# Patient Record
Sex: Female | Born: 1950 | Race: White | Hispanic: No | Marital: Married | Smoking: Former smoker
Health system: Southern US, Community
[De-identification: ages and names within clinical notes are randomized; demographics above are authoritative.]

## PROBLEM LIST (undated history)

## (undated) DIAGNOSIS — M3500 Sicca syndrome, unspecified: Secondary | ICD-10-CM

## (undated) DIAGNOSIS — F419 Anxiety disorder, unspecified: Secondary | ICD-10-CM

## (undated) DIAGNOSIS — T7840XA Allergy, unspecified, initial encounter: Secondary | ICD-10-CM

## (undated) DIAGNOSIS — M3390 Dermatopolymyositis, unspecified, organ involvement unspecified: Secondary | ICD-10-CM

## (undated) DIAGNOSIS — R112 Nausea with vomiting, unspecified: Secondary | ICD-10-CM

## (undated) DIAGNOSIS — Z87891 Personal history of nicotine dependence: Secondary | ICD-10-CM

## (undated) DIAGNOSIS — K219 Gastro-esophageal reflux disease without esophagitis: Secondary | ICD-10-CM

## (undated) DIAGNOSIS — I7300 Raynaud's syndrome without gangrene: Secondary | ICD-10-CM

## (undated) DIAGNOSIS — Z9889 Other specified postprocedural states: Secondary | ICD-10-CM

## (undated) DIAGNOSIS — C801 Malignant (primary) neoplasm, unspecified: Secondary | ICD-10-CM

## (undated) HISTORY — PX: NASAL SEPTUM SURGERY: SHX37

## (undated) HISTORY — PX: WISDOM TOOTH EXTRACTION: SHX21

## (undated) HISTORY — DX: Allergy, unspecified, initial encounter: T78.40XA

## (undated) HISTORY — PX: BREAST SURGERY: SHX581

## (undated) HISTORY — DX: Anxiety disorder, unspecified: F41.9

## (undated) HISTORY — DX: Personal history of nicotine dependence: Z87.891

## (undated) HISTORY — DX: Sicca syndrome, unspecified: M35.00

## (undated) HISTORY — PX: DIAGNOSTIC LAPAROSCOPY: SUR761

## (undated) HISTORY — PX: OTHER SURGICAL HISTORY: SHX169

## (undated) HISTORY — DX: Raynaud's syndrome without gangrene: I73.00

## (undated) HISTORY — PX: COLONOSCOPY: SHX174

## (undated) HISTORY — DX: Dermatopolymyositis, unspecified, organ involvement unspecified: M33.90

## (undated) HISTORY — PX: TONSILLECTOMY: SUR1361

## (undated) HISTORY — PX: BUNIONECTOMY: SHX129

---

## 1998-02-20 ENCOUNTER — Other Ambulatory Visit: Admission: RE | Admit: 1998-02-20 | Discharge: 1998-02-20 | Payer: Self-pay | Admitting: Obstetrics & Gynecology

## 1999-07-23 ENCOUNTER — Other Ambulatory Visit: Admission: RE | Admit: 1999-07-23 | Discharge: 1999-07-23 | Payer: Self-pay | Admitting: Obstetrics & Gynecology

## 2000-08-05 ENCOUNTER — Other Ambulatory Visit: Admission: RE | Admit: 2000-08-05 | Discharge: 2000-08-05 | Payer: Self-pay | Admitting: Obstetrics & Gynecology

## 2002-05-03 ENCOUNTER — Other Ambulatory Visit: Admission: RE | Admit: 2002-05-03 | Discharge: 2002-05-03 | Payer: Self-pay | Admitting: Obstetrics & Gynecology

## 2003-05-08 ENCOUNTER — Other Ambulatory Visit: Admission: RE | Admit: 2003-05-08 | Discharge: 2003-05-08 | Payer: Self-pay | Admitting: Obstetrics & Gynecology

## 2004-11-05 ENCOUNTER — Other Ambulatory Visit: Admission: RE | Admit: 2004-11-05 | Discharge: 2004-11-05 | Payer: Self-pay | Admitting: Obstetrics & Gynecology

## 2009-01-11 ENCOUNTER — Encounter (INDEPENDENT_AMBULATORY_CARE_PROVIDER_SITE_OTHER): Payer: Self-pay | Admitting: *Deleted

## 2011-04-14 ENCOUNTER — Other Ambulatory Visit: Payer: Self-pay | Admitting: Obstetrics and Gynecology

## 2011-04-14 ENCOUNTER — Encounter (HOSPITAL_COMMUNITY): Payer: Self-pay | Admitting: *Deleted

## 2011-04-15 ENCOUNTER — Encounter (HOSPITAL_COMMUNITY): Payer: Self-pay | Admitting: Anesthesiology

## 2011-04-15 ENCOUNTER — Other Ambulatory Visit: Payer: Self-pay | Admitting: Obstetrics and Gynecology

## 2011-04-15 ENCOUNTER — Ambulatory Visit (HOSPITAL_COMMUNITY): Payer: BC Managed Care – PPO | Admitting: Anesthesiology

## 2011-04-15 ENCOUNTER — Ambulatory Visit (HOSPITAL_COMMUNITY)
Admission: RE | Admit: 2011-04-15 | Discharge: 2011-04-15 | Disposition: A | Discharge status: Home / Self Care | Payer: BC Managed Care – PPO | Source: Ambulatory Visit | Attending: Obstetrics and Gynecology | Admitting: Obstetrics and Gynecology

## 2011-04-15 ENCOUNTER — Encounter (HOSPITAL_COMMUNITY)
Admission: RE | Disposition: A | Discharge status: Home / Self Care | Payer: Self-pay | Source: Ambulatory Visit | Attending: Obstetrics and Gynecology

## 2011-04-15 ENCOUNTER — Encounter (HOSPITAL_COMMUNITY): Payer: Self-pay | Admitting: *Deleted

## 2011-04-15 DIAGNOSIS — N950 Postmenopausal bleeding: Secondary | ICD-10-CM | POA: Insufficient documentation

## 2011-04-15 DIAGNOSIS — N939 Abnormal uterine and vaginal bleeding, unspecified: Secondary | ICD-10-CM

## 2011-04-15 HISTORY — DX: Other specified postprocedural states: R11.2

## 2011-04-15 HISTORY — PX: DILATION AND CURETTAGE OF UTERUS: SHX78

## 2011-04-15 HISTORY — DX: Malignant (primary) neoplasm, unspecified: C80.1

## 2011-04-15 HISTORY — DX: Gastro-esophageal reflux disease without esophagitis: K21.9

## 2011-04-15 HISTORY — DX: Other specified postprocedural states: Z98.890

## 2011-04-15 LAB — CBC
HCT: 44.5 % (ref 36.0–46.0)
Hemoglobin: 15.1 g/dL — ABNORMAL HIGH (ref 12.0–15.0)
RBC: 4.87 MIL/uL (ref 3.87–5.11)
WBC: 7.1 10*3/uL (ref 4.0–10.5)

## 2011-04-15 SURGERY — DILATION AND CURETTAGE
Anesthesia: Monitor Anesthesia Care | Site: Vagina | Wound class: Clean Contaminated

## 2011-04-15 MED ORDER — FENTANYL CITRATE 0.05 MG/ML IJ SOLN
INTRAMUSCULAR | Status: AC
  Filled 2011-04-15: qty 2

## 2011-04-15 MED ORDER — MIDAZOLAM HCL 2 MG/2ML IJ SOLN
INTRAMUSCULAR | Status: AC
  Filled 2011-04-15: qty 2

## 2011-04-15 MED ORDER — PROPOFOL 10 MG/ML IV EMUL
INTRAVENOUS | Status: AC
  Filled 2011-04-15: qty 20

## 2011-04-15 MED ORDER — PROMETHAZINE HCL 25 MG/ML IJ SOLN: 6.2500 mg | INTRAMUSCULAR | Status: DC | PRN

## 2011-04-15 MED ORDER — LIDOCAINE HCL (CARDIAC) 20 MG/ML IV SOLN
INTRAVENOUS | Status: DC | PRN
  Administered 2011-04-15: 20 mg via INTRAVENOUS

## 2011-04-15 MED ORDER — CEFAZOLIN SODIUM 1-5 GM-% IV SOLN
1.0000 g | INTRAVENOUS | Status: AC
  Administered 2011-04-15: 1 g via INTRAVENOUS

## 2011-04-15 MED ORDER — ONDANSETRON HCL 4 MG/2ML IJ SOLN
INTRAMUSCULAR | Status: DC | PRN
  Administered 2011-04-15: 4 mg via INTRAVENOUS

## 2011-04-15 MED ORDER — DEXAMETHASONE SODIUM PHOSPHATE 4 MG/ML IJ SOLN
INTRAMUSCULAR | Status: DC | PRN
  Administered 2011-04-15: 4 mg via INTRAVENOUS

## 2011-04-15 MED ORDER — MEPERIDINE HCL 25 MG/ML IJ SOLN: 6.2500 mg | INTRAMUSCULAR | Status: DC | PRN

## 2011-04-15 MED ORDER — FENTANYL CITRATE 0.05 MG/ML IJ SOLN
25.0000 ug | INTRAMUSCULAR | Status: DC | PRN
  Administered 2011-04-15 (×2): 50 ug via INTRAVENOUS

## 2011-04-15 MED ORDER — KETOROLAC TROMETHAMINE 60 MG/2ML IM SOLN
INTRAMUSCULAR | Status: AC
  Filled 2011-04-15: qty 2

## 2011-04-15 MED ORDER — FENTANYL CITRATE 0.05 MG/ML IJ SOLN
INTRAMUSCULAR | Status: AC
  Administered 2011-04-15: 50 ug via INTRAVENOUS
  Filled 2011-04-15: qty 2

## 2011-04-15 MED ORDER — KETOROLAC TROMETHAMINE 30 MG/ML IJ SOLN: 15.0000 mg | Freq: Once | INTRAMUSCULAR | Status: DC | PRN

## 2011-04-15 MED ORDER — LACTATED RINGERS IV SOLN
INTRAVENOUS | Status: DC
  Administered 2011-04-15 (×3): via INTRAVENOUS

## 2011-04-15 MED ORDER — CEFAZOLIN SODIUM 1-5 GM-% IV SOLN
INTRAVENOUS | Status: AC
  Filled 2011-04-15: qty 50

## 2011-04-15 MED ORDER — DEXAMETHASONE SODIUM PHOSPHATE 10 MG/ML IJ SOLN
INTRAMUSCULAR | Status: AC
  Filled 2011-04-15: qty 1

## 2011-04-15 MED ORDER — LIDOCAINE HCL 1 % IJ SOLN
INTRAMUSCULAR | Status: DC | PRN
  Administered 2011-04-15: 6 mL

## 2011-04-15 MED ORDER — IBUPROFEN 200 MG PO TABS: 600.0000 mg | ORAL_TABLET | Freq: Four times a day (QID) | ORAL | Status: AC | PRN

## 2011-04-15 MED ORDER — FENTANYL CITRATE 0.05 MG/ML IJ SOLN: 25.0000 ug | INTRAMUSCULAR | Status: DC | PRN

## 2011-04-15 MED ORDER — ONDANSETRON HCL 4 MG/2ML IJ SOLN
INTRAMUSCULAR | Status: AC
  Filled 2011-04-15: qty 2

## 2011-04-15 MED ORDER — PROPOFOL 10 MG/ML IV EMUL
INTRAVENOUS | Status: DC | PRN
  Administered 2011-04-15 (×2): 30 mg via INTRAVENOUS
  Administered 2011-04-15 (×3): 20 mg via INTRAVENOUS
  Administered 2011-04-15: 30 mg via INTRAVENOUS

## 2011-04-15 MED ORDER — MIDAZOLAM HCL 5 MG/5ML IJ SOLN
INTRAMUSCULAR | Status: DC | PRN
  Administered 2011-04-15: 2 mg via INTRAVENOUS

## 2011-04-15 MED ORDER — LIDOCAINE HCL (CARDIAC) 20 MG/ML IV SOLN
INTRAVENOUS | Status: AC
  Filled 2011-04-15: qty 5

## 2011-04-15 MED ORDER — KETOROLAC TROMETHAMINE 30 MG/ML IJ SOLN
INTRAMUSCULAR | Status: DC | PRN
  Administered 2011-04-15: 30 mg via INTRAVENOUS

## 2011-04-15 MED ORDER — FENTANYL CITRATE 0.05 MG/ML IJ SOLN
INTRAMUSCULAR | Status: DC | PRN
  Administered 2011-04-15: 50 ug via INTRAVENOUS
  Administered 2011-04-15 (×2): 25 ug via INTRAVENOUS

## 2011-04-15 SURGICAL SUPPLY — 13 items
CATH ROBINSON RED A/P 16FR (CATHETERS) ×2 IMPLANT
CLOTH BEACON ORANGE TIMEOUT ST (SAFETY) ×2 IMPLANT
CONTAINER PREFILL 10% NBF 60ML (FORM) ×4 IMPLANT
DECANTER SPIKE VIAL GLASS SM (MISCELLANEOUS) ×1 IMPLANT
GLOVE ECLIPSE 7.0 STRL STRAW (GLOVE) ×4 IMPLANT
GOWN PREVENTION PLUS LG XLONG (DISPOSABLE) ×2 IMPLANT
GOWN PREVENTION PLUS XLARGE (GOWN DISPOSABLE) ×2 IMPLANT
PACK VAGINAL MINOR WOMEN LF (CUSTOM PROCEDURE TRAY) ×2 IMPLANT
PAD PREP 24X48 CUFFED NSTRL (MISCELLANEOUS) ×2 IMPLANT
SET BERKELEY SUCTION TUBING (SUCTIONS) ×1 IMPLANT
TOWEL OR 17X24 6PK STRL BLUE (TOWEL DISPOSABLE) ×4 IMPLANT
VACURETTE 7MM CVD STRL WRAP (CANNULA) ×1 IMPLANT
WATER STERILE IRR 1000ML POUR (IV SOLUTION) ×2 IMPLANT

## 2011-04-15 NOTE — Anesthesia Postprocedure Evaluation (Signed)
Anesthesia Post Note  Patient: Marisa Orozco  Procedure(s) Performed:  DILATATION AND CURETTAGE  Anesthesia type: MAC  Patient location: PACU  Post pain: Pain level controlled  Post assessment: Post-op Vital signs reviewed  Last Vitals:  Filed Vitals:   04/15/11 1400  BP: 118/63  Pulse: 69  Temp:   Resp: 18    Post vital signs: Reviewed  Level of consciousness: sedated  Complications: No apparent anesthesia complications

## 2011-04-15 NOTE — Anesthesia Preprocedure Evaluation (Addendum)
Anesthesia Evaluation    Reviewed: Allergy & Precautions, H&P , Patient's Chart, lab work & pertinent test results, reviewed documented beta blocker date and time   History of Anesthesia Complications (+) PONV  Airway Mallampati: I TM Distance: >3 FB Neck ROM: full    Dental No notable dental hx. (+) Teeth Intact   Pulmonary neg pulmonary ROS,  clear to auscultation  Pulmonary exam normal       Cardiovascular neg cardio ROS regular Normal    Neuro/Psych Negative Neurological ROS  Negative Psych ROS   GI/Hepatic Neg liver ROS, GERD-  ,  Endo/Other  Negative Endocrine ROS  Renal/GU negative Renal ROS  Genitourinary negative   Musculoskeletal negative musculoskeletal ROS (+)   Abdominal Normal abdominal exam  (+) obese,  Abdomen: soft.    Peds  Hematology negative hematology ROS (+)   Anesthesia Other Findings   Reproductive/Obstetrics negative OB ROS                          Anesthesia Physical Anesthesia Plan  ASA: II  Anesthesia Plan: MAC   Post-op Pain Management:    Induction:   Airway Management Planned:   Additional Equipment:   Intra-op Plan:   Post-operative Plan:   Informed Consent: I have reviewed the patients History and Physical, chart, labs and discussed the procedure including the risks, benefits and alternatives for the proposed anesthesia with the patient or authorized representative who has indicated his/her understanding and acceptance.     Plan Discussed with: Anesthesiologist and Surgeon  Anesthesia Plan Comments:         Anesthesia Quick Evaluation

## 2011-04-15 NOTE — Transfer of Care (Signed)
Immediate Anesthesia Transfer of Care Note  Patient: Marisa Orozco  Procedure(s) Performed:  DILATATION AND CURETTAGE  Patient Location: PACU  Anesthesia Type: MAC  Level of Consciousness: awake, alert , oriented and patient cooperative  Airway & Oxygen Therapy: Patient Spontanous Breathing and Patient connected to nasal cannula oxygen  Post-op Assessment: Report given to PACU RN and Post -op Vital signs reviewed and stable  Post vital signs: Reviewed and stable  Complications: No apparent anesthesia complications

## 2011-04-15 NOTE — Preoperative (Signed)
Beta Blockers   Reason not to administer Beta Blockers:Not Applicable 

## 2011-04-15 NOTE — H&P (Signed)
Pt is a 61 y/o white female who had an episode of heavy postmenopausal bleeding two days ago.  Pt has been on ERT for 3 yrs. No problems until recently. Pt stopped ERT 3 weeks ago for 10 days.  Then restarted. Seen in office two days ago. Copious amt of dark blood. U/S revealed large clot in cervix. Os appears stenotic. Bleeding has contiuned to be heavy.  PE ABD- wnl Pelvic- ext-wnl Vagina- dark blood Cx- soft, enlarged, stenotic Ut-nontender IMP/ PMB Plan/D & C

## 2011-04-16 ENCOUNTER — Encounter (HOSPITAL_COMMUNITY): Payer: Self-pay | Admitting: Obstetrics and Gynecology

## 2011-04-16 NOTE — Op Note (Signed)
Marisa Orozco, Marisa Orozco NO.:  [id number]  MEDICAL RECORD NO.:  [id number]  LOCATION:  [facility]                          FACILITY:  [facility]  PHYSICIAN:  Malva Limes, M.D.    DATE OF BIRTH:  10-28-1950  DATE OF PROCEDURE:  04/15/2011 DATE OF DISCHARGE:  04/15/2011                              OPERATIVE REPORT   PREOPERATIVE DIAGNOSIS:  Postmenopausal bleeding.  POSTOPERATIVE DIAGNOSIS:  Postmenopausal bleeding.  PROCEDURE:  Dilation and curettage.  SURGEON:  Malva Limes, M.D.  ANESTHESIA:  MAC with paracervical block.  DRAINS:  None.  ANTIBIOTICS:  Ancef 1 g.  SPECIMENS:  Endometrial curettings sent to pathology.  COMPLICATIONS:  None.  ESTIMATED BLOOD LOSS:  75 mL.  PROCEDURE:  The patient was taken to the operating room where she was placed in dorsal supine position.  MAC anesthesia was administered.  She was placed in dorsal lithotomy position.  She was prepped and draped in the usual fashion for this procedure.  A sterile speculum was placed in the vagina, 20 mL of 1% lidocaine was used for paracervical block.  The single-tooth tenaculum was applied to the anterior cervical lip.  The cervix was serially dilated to a 27-French.  Suction curette was placed into the uterine cavity because of the copious amount of dark-tarry fluid coming out of the os.  Once this was accomplished, sharp curettage was performed and the tissue was sent to the pathology.  Following this, a repeat suction was done, no more bleeding was noted.  On exam, the patient appeared to have a submucous fibroid while the curettage was being performed.          ______________________________ Malva Limes, M.D.    MA/MEDQ  D:  04/15/2011  T:  04/16/2011  Job:  [id number]

## 2012-01-18 ENCOUNTER — Encounter: Payer: Self-pay | Admitting: Gastroenterology

## 2012-02-15 ENCOUNTER — Ambulatory Visit (INDEPENDENT_AMBULATORY_CARE_PROVIDER_SITE_OTHER): Payer: BC Managed Care – PPO | Admitting: Internal Medicine

## 2012-02-15 ENCOUNTER — Ambulatory Visit: Payer: BC Managed Care – PPO | Admitting: Endocrinology

## 2012-02-15 ENCOUNTER — Other Ambulatory Visit: Payer: Self-pay | Admitting: Internal Medicine

## 2012-02-15 ENCOUNTER — Encounter: Payer: Self-pay | Admitting: Internal Medicine

## 2012-02-15 VITALS — BP 122/72 | HR 77 | Temp 97.9°F | Resp 12 | Ht 68.0 in | Wt 149.6 lb

## 2012-02-15 DIAGNOSIS — R5381 Other malaise: Secondary | ICD-10-CM

## 2012-02-15 DIAGNOSIS — R5383 Other fatigue: Secondary | ICD-10-CM

## 2012-02-15 DIAGNOSIS — E559 Vitamin D deficiency, unspecified: Secondary | ICD-10-CM

## 2012-02-15 NOTE — Patient Instructions (Addendum)
We will check a vitamin D level today, I will let you know the results ASAP.  Please consider a plant-based diet.  To help you with this, you can start by watching/reading the following: - lectures:  Lequita Asal: "Breaking the Food Seduction"  Doug Lisle: "How to Lose Weight, without Losing Your Mind"  Lucile Crater: "What is Insulin Resistance "[email or web address] - documentaries:  Supersize Me  [employer]  Forks over [employer], Sick and Nearly Dead  [employer] - books:  Lequita Asal: "Program for Reversing Diabetes"  Ferol Luz: "The [place] Study"  Konrad Penta: "Supermarket Vegan" (cookbook)

## 2012-02-15 NOTE — Progress Notes (Signed)
Subjective:     Patient ID: Marisa Orozco, female   DOB: 06-Oct-1950, 61 y.o.   MRN: [id number]  HPI  Pt is a pleasant 61 y/o WW referred by her PCP for evaluation for fatigue. She is wondering whether her fatigue can be attributed to abnormal thyroid function (although TSH 2.38 on 02/04/2012).   She c/o fatigue x >6 mo. She feels mostly tired in the afternoon. She stays on her bioidentical hh regimen (estrogen cream, progesterone cream)  for the last 10 years, and has added Prometrium 100 mg about 5 mo ago. She feels much better on HRT than without it. However, she did not take it consistently since January when she had to get a D&C. She also c/o dry skin, falling hair. She gained 40 lbs since menopause, recently lost 20 lbs, then regained them. No issues with DM2, HTN, HL, but these run in her family.   She remembers that she had a low vitamin D level of 20 at one of her checks with her PCP, a value that was measured after taking OTC vit D. We asked for lab records from PCP and these were sent during the encounter. Her last CBC was normal, only with higher eosinophiles of 6% (nl 0-5%). Lipids normal, LDL 104, Tg 102. ALT higher, at 41 (0-32).  Pt started to walk 3 miles 4x/week for exercise. She is not sleeping well at night as she has a handicapped daughter in her 61s with intractable seizures and pt's sleep in interrupted every night. However, this is a problem that has been going on for a long time, while her fatigue is new.   Past Medical History  Diagnosis Date  . PONV (postoperative nausea and vomiting)   . GERD (gastroesophageal reflux disease)     otc med prn  . Cancer     basal cell carcinoma - face   Past Surgical History  Procedure Date  . Svd     x 2  . Tonsillectomy   . Bunionectomy     right foot  . Breast surgery     augmentation  . Nasal septum surgery   . Diagnostic laparoscopy   . Colonoscopy   . Wisdom tooth extraction   . Dilation and curettage of uterus  04/15/2011    Procedure: DILATATION AND CURETTAGE;  Surgeon: Levi Aland;  Location: [facility];  Service: Gynecology;  Laterality: N/A;   History   Social History  . Marital Status: Married    Spouse Name: N/A    Number of Children: N/A  . Years of Education: N/A   Occupational History  . Not on file.   Social History Main Topics  . Smoking status: Former Smoker -- 1.0 packs/day for 25 years    Types: Cigarettes    Quit date: 08/05/2003  . Smokeless tobacco: Never Used  . Alcohol Use: Yes     Comment: socially - wine  . Drug Use: No  . Sexually Active: Yes    Birth Control/ Protection: None   Other Topics Concern  . Not on file   Social History Narrative  . No narrative on file    Current Outpatient Prescriptions on File Prior to Visit  Medication Sig Dispense Refill  . PRESCRIPTION MEDICATION Apply 0.5 g topically 2 (two) times daily. Estrogen cream 8mg /gm      . PRESCRIPTION MEDICATION Apply 1 g/day topically daily. Progesterone 30 mg/gm cream      . progesterone (PROMETRIUM) 100 MG capsule Take 100  mg by mouth at bedtime.        Allergies  Allergen Reactions  . Codeine Nausea And Vomiting  . Esomeprazole Magnesium Other (See Comments)    Reaction Muscle weakness  . Flagyl (Metronidazole Hcl) Other (See Comments)    Reaction muscle weakness  . Sulfa Drugs Cross Reactors     Family History  Problem Relation Age of Onset  . COPD Mother   . Heart disease Mother   . Diabetes Father   . Kidney disease Father     Review of Systems Per HPI. Denies CP/SOB/N/V/D/C.    Objective:   Physical Exam  Nursing note and vitals reviewed. Constitutional: She is oriented to person, place, and time.       Overweight, in NAD  Eyes: EOM are normal. Pupils are equal, round, and reactive to light.  Neck: Neck supple. No thyromegaly present.  Cardiovascular: Normal rate and regular rhythm.  Exam reveals no gallop and no friction rub.   No murmur heard. Pulmonary/Chest:  Effort normal and breath sounds normal.  Abdominal: Soft. Bowel sounds are normal. There is no tenderness.  Musculoskeletal: She exhibits no edema.  Lymphadenopathy:    She has no cervical adenopathy.  Neurological: She is alert and oriented to person, place, and time. She displays normal reflexes.  Skin: Skin is warm and dry. No rash noted.  Psychiatric: She has a normal mood and affect.   BP 122/72  Pulse 77  Temp 97.9 F (36.6 C) (Oral)  Resp 12  Ht 5\' 8"  (1.727 m)  Wt 149 lb 9.6 oz (67.858 kg)  BMI 22.75 kg/m2  SpO2 98%    Assessment:     1. Fatigue - normal TSH - no anemia - normal anti-ds ABx  - prediabetes - repeated IFG (last Glu 113) - low vitamin D per previous measurements - weight gain    Plan:     We had a long discussion about possible causes of fatigue.  - no hypothyroidism since last TSH 2.38 - no anemia since last Hb 15.1 - likely no celiac disease (with weight gain and normal Hb) - no diabetes, but likely has prediabetes - however she did start to take Prometrium po in the last month and, although she takes a lower dose (100 mg) and takes it at night, it might still influence her energy level. I advised her to talk to her ObGyn to switch back to only Progesterone cream or use an IUD. - she has a h/o low vit. D level, I will recheck this today and start high dose Ergocalciferol if low - I also advised her to try a plant-based diet since this might help her energy level, weight, and glucose levels. I recommended some reference materials (see pt instructions).  Orders Only on 02/15/2012  Component Date Value Range Status  . Vit D, 25-Hydroxy 02/15/2012 13* 30 - 89 ng/mL Final   Comment: This assay accurately quantifies Vitamin D, which is the sum of the                          25-Hydroxy forms of Vitamin D2 and D3.  Studies have shown that the                          optimum concentration of 25-Hydroxy Vitamin D is 30 ng/mL or higher.  Concentrations of Vitamin D between 20 and 29 ng/mL are considered to                          be insufficient and concentrations less than 20 ng/mL are considered                          to be deficient for Vitamin D.   Vit D very low, will give Ergocalciferol 50,000 IU 2x a week for 8 weeks and then return to recheck level. Called pt and informed her. She agrees to plan.

## 2012-02-16 ENCOUNTER — Other Ambulatory Visit: Payer: Self-pay | Admitting: Internal Medicine

## 2012-02-16 MED ORDER — ERGOCALCIFEROL 1.25 MG (50000 UT) PO CAPS: 50000.0000 [IU] | ORAL_CAPSULE | ORAL | Status: DC

## 2012-07-29 ENCOUNTER — Encounter: Payer: Self-pay | Admitting: Gastroenterology

## 2012-08-01 ENCOUNTER — Other Ambulatory Visit: Payer: Self-pay | Admitting: Obstetrics & Gynecology

## 2012-08-15 ENCOUNTER — Ambulatory Visit: Payer: BC Managed Care – PPO | Admitting: Internal Medicine

## 2012-09-19 ENCOUNTER — Other Ambulatory Visit: Payer: Self-pay | Admitting: Dermatology

## 2012-10-18 ENCOUNTER — Other Ambulatory Visit: Payer: Self-pay | Admitting: Dermatology

## 2012-12-14 ENCOUNTER — Encounter: Payer: Self-pay | Admitting: Gastroenterology

## 2013-02-02 ENCOUNTER — Ambulatory Visit (AMBULATORY_SURGERY_CENTER): Payer: Self-pay | Admitting: *Deleted

## 2013-02-02 VITALS — Ht 68.0 in | Wt 184.6 lb

## 2013-02-02 DIAGNOSIS — Z1211 Encounter for screening for malignant neoplasm of colon: Secondary | ICD-10-CM

## 2013-02-02 MED ORDER — PEG-KCL-NACL-NASULF-NA ASC-C 100 G PO SOLR: ORAL | Status: DC

## 2013-02-02 NOTE — Progress Notes (Signed)
No egg or soy allergy 

## 2013-02-06 ENCOUNTER — Encounter: Payer: Self-pay | Admitting: Gastroenterology

## 2013-02-07 ENCOUNTER — Telehealth: Payer: Self-pay | Admitting: Gastroenterology

## 2013-02-07 NOTE — Telephone Encounter (Signed)
Spoke with the patient.  She will call back if she is not able to keep appt for her colonoscopy on Friday.  She has an appt with her GYN at 11:40 today.

## 2013-02-10 ENCOUNTER — Encounter: Payer: Self-pay | Admitting: Gastroenterology

## 2013-02-10 ENCOUNTER — Telehealth: Payer: Self-pay | Admitting: Gastroenterology

## 2013-02-10 ENCOUNTER — Ambulatory Visit (AMBULATORY_SURGERY_CENTER): Payer: BC Managed Care – PPO | Admitting: Gastroenterology

## 2013-02-10 VITALS — BP 115/65 | HR 75 | Temp 98.3°F | Resp 20 | Ht 68.0 in | Wt 184.0 lb

## 2013-02-10 DIAGNOSIS — D126 Benign neoplasm of colon, unspecified: Secondary | ICD-10-CM

## 2013-02-10 DIAGNOSIS — R109 Unspecified abdominal pain: Secondary | ICD-10-CM

## 2013-02-10 DIAGNOSIS — Z1211 Encounter for screening for malignant neoplasm of colon: Secondary | ICD-10-CM

## 2013-02-10 MED ORDER — HYOSCYAMINE SULFATE 0.125 MG SL SUBL: 0.1250 mg | SUBLINGUAL_TABLET | SUBLINGUAL | Status: DC | PRN

## 2013-02-10 MED ORDER — SODIUM CHLORIDE 0.9 % IV SOLN: 500.0000 mL | INTRAVENOUS | Status: DC

## 2013-02-10 NOTE — Progress Notes (Signed)
Called to room to assist during endoscopic procedure.  Patient ID and intended procedure confirmed with present staff. Received instructions for my participation in the procedure from the performing physician.  

## 2013-02-10 NOTE — Telephone Encounter (Signed)
Returned pt's call requesting levsin to be resent to pharmacy . Medication had been sent but called pharmacy [facility] [address] & they did not receive it so gave them order per phone.

## 2013-02-10 NOTE — Progress Notes (Signed)
Patient did not have preoperative order for IV antibiotic SSI prophylaxis. (G8918)  Patient did not experience any of the following events: a burn prior to discharge; a fall within the facility; wrong site/side/patient/procedure/implant event; or a hospital transfer or hospital admission upon discharge from the facility. (G8907)  

## 2013-02-10 NOTE — Patient Instructions (Signed)
YOU HAD AN ENDOSCOPIC PROCEDURE TODAY AT THE [facility]: Refer to the procedure report that was given to you for any specific questions about what was found during the examination.  If the procedure report does not answer your questions, please call your gastroenterologist to clarify.  If you requested that your care partner not be given the details of your procedure findings, then the procedure report has been included in a sealed envelope for you to review at your convenience later.  YOU SHOULD EXPECT: Some feelings of bloating in the abdomen. Passage of more gas than usual.  Walking can help get rid of the air that was put into your GI tract during the procedure and reduce the bloating. If you had a lower endoscopy (such as a colonoscopy or flexible sigmoidoscopy) you may notice spotting of blood in your stool or on the toilet paper. If you underwent a bowel prep for your procedure, then you may not have a normal bowel movement for a few days.  DIET: Your first meal following the procedure should be a light meal and then it is ok to progress to your normal diet.  A half-sandwich or bowl of soup is an example of a good first meal.  Heavy or fried foods are harder to digest and may make you feel nauseous or bloated.  Likewise meals heavy in dairy and vegetables can cause extra gas to form and this can also increase the bloating.  Drink plenty of fluids but you should avoid alcoholic beverages for 24 hours.  ACTIVITY: Your care partner should take you home directly after the procedure.  You should plan to take it easy, moving slowly for the rest of the day.  You can resume normal activity the day after the procedure however you should NOT DRIVE or use heavy machinery for 24 hours (because of the sedation medicines used during the test).    SYMPTOMS TO REPORT IMMEDIATELY: A gastroenterologist can be reached at any hour.  During normal business hours, 8:30 AM to 5:00 PM Monday through Friday,  call [phone].  After hours and on weekends, please call the GI answering service at [phone] who will take a message and have the physician on call contact you.   Following lower endoscopy (colonoscopy or flexible sigmoidoscopy):  Excessive amounts of blood in the stool  Significant tenderness or worsening of abdominal pains  Swelling of the abdomen that is new, acute  Fever of 100F or higher  FOLLOW UP: If any biopsies were taken you will be contacted by phone or by letter within the next 1-3 weeks.  Call your gastroenterologist if you have not heard about the biopsies in 3 weeks.  Our staff will call the home number listed on your records the next business day following your procedure to check on you and address any questions or concerns that you may have at that time regarding the information given to you following your procedure. This is a courtesy call and so if there is no answer at the home number and we have not heard from you through the emergency physician on call, we will assume that you have returned to your regular daily activities without incident.  SIGNATURES/CONFIDENTIALITY: You and/or your care partner have signed paperwork which will be entered into your electronic medical record.  These signatures attest to the fact that that the information above on your After Visit Summary has been reviewed and is understood.  Full responsibility of the confidentiality of this   discharge information lies with you and/or your care-partner.  Recommendations See procedure report  

## 2013-02-10 NOTE — Progress Notes (Signed)
Report to pacu rn, vss, bbs=clear 

## 2013-02-10 NOTE — Op Note (Signed)
[facility] [address] [place], [address]   COLONOSCOPY PROCEDURE REPORT PATIENT: Marisa Orozco, Marisa Orozco  MR#: [id number] BIRTHDATE: [birth year] , 62  yrs. old GENDER: Female ENDOSCOPIST: Meryl Dare, MD, [facility] REFERRED AO:ZHYQM Reuel Boom, M.D. PROCEDURE DATE:  02/10/2013 PROCEDURE:   Colonoscopy with snare polypectomy First Screening Colonoscopy - Avg.  risk and is 50 yrs.  old or older - No.  Prior Negative Screening - Now for repeat screening. 10 or more years since last screening  History of Adenoma - Now for follow-up colonoscopy & has been > or = to 3 yrs.  N/A  Polyps Removed Today? Yes. ASA CLASS:   Class II INDICATIONS:average risk screening. MEDICATIONS: MAC sedation, administered by CRNA and propofol (Diprivan) 300mg  IV DESCRIPTION OF PROCEDURE:   After the risks benefits and alternatives of the procedure were thoroughly explained, informed consent was obtained.  A digital rectal exam revealed no abnormalities of the rectum.   The LB [id number]  endoscope was introduced through the anus and advanced to the cecum, which was identified by both the appendix and ileocecal valve. No adverse events experienced.   The quality of the prep was excellent, using MoviPrep  The instrument was then slowly withdrawn as the colon was fully examined.  COLON FINDINGS: Two sessile polyps measuring 5 mm in size were found in the descending colon and sigmoid colon.  A polypectomy was performed with a cold snare.  The resection was complete and the polyp tissue was completely retrieved.   Mild diverticulosis was noted in the sigmoid colon.  The colon was otherwise normal.  There was no diverticulosis, inflammation, polyps or cancers unless previously stated.  Retroflexed views revealed no abnormalities. The time to cecum=1 minutes 33 seconds.  Withdrawal time=8 minutes 59 seconds.  The scope was withdrawn and the procedure completed. COMPLICATIONS: There were no  complications.  ENDOSCOPIC IMPRESSION: 1.   Two sessile polyps measuring 5 mm in the descending and sigmoid colon; polypectomy performed with a cold snare 2.   Mild diverticulosis in the sigmoid colon  RECOMMENDATIONS: 1.  Await pathology results 2.  High fiber diet with liberal fluid intake. 3.  Repeat colonoscopy in 5 years if polyp(s) adenomatous; otherwise 10 years  eSigned:  Meryl Dare, MD, [facility] 02/10/2013 2:54 PM

## 2013-02-13 ENCOUNTER — Telehealth: Payer: Self-pay | Admitting: *Deleted

## 2013-02-13 NOTE — Telephone Encounter (Signed)
  Follow up Call-  Call back number 02/10/2013  Post procedure Call Back phone  # [phone]  Permission to leave phone message Yes     No answer,left message.

## 2013-02-16 ENCOUNTER — Encounter: Payer: Self-pay | Admitting: Gastroenterology

## 2013-08-07 ENCOUNTER — Other Ambulatory Visit: Payer: Self-pay | Admitting: Obstetrics & Gynecology

## 2013-11-09 ENCOUNTER — Other Ambulatory Visit: Payer: Self-pay | Admitting: Dermatology

## 2014-09-04 ENCOUNTER — Other Ambulatory Visit: Payer: Self-pay | Admitting: Obstetrics & Gynecology

## 2014-09-11 ENCOUNTER — Encounter: Payer: Self-pay | Admitting: Dietician

## 2014-09-11 ENCOUNTER — Encounter: Payer: BLUE CROSS/BLUE SHIELD | Attending: Family Medicine | Admitting: Dietician

## 2014-09-11 VITALS — Ht 66.5 in | Wt 178.6 lb

## 2014-09-11 DIAGNOSIS — Z713 Dietary counseling and surveillance: Secondary | ICD-10-CM | POA: Insufficient documentation

## 2014-09-11 DIAGNOSIS — R7309 Other abnormal glucose: Secondary | ICD-10-CM | POA: Insufficient documentation

## 2014-09-11 DIAGNOSIS — E663 Overweight: Secondary | ICD-10-CM | POA: Diagnosis not present

## 2014-09-11 NOTE — Patient Instructions (Signed)
Try adding some protein powder to green smoothie. Add protein (egg) to oatmeal at lunch. Add protein (chicken, fish, beans) to dinner meal. Have carbohydrate on about a quarter of your plate or what you can hold in your hand. Have protein the size of the palm of your hand. If you are hungry, have a snack with protein and carbohydrates (see list). Pay attention to hunger/fullness cues.  Try having meals/snacks at the table without distractions.  Continue walking most days. Increase the amount of time walking when you can.  Look into exercise/yoga class.

## 2014-09-11 NOTE — Progress Notes (Signed)
  Medical Nutrition Therapy:  Appt start time: 1120 end time:  1215.   Assessment:  Primary concerns today: Marisa Orozco is here today since she has a family hx of diabetes and has Pre-Diabetes. Last Hgb A1c was 6.0% and fasting glucose was 117 mg/dl. Blood sugar has been elevated for the past few years. Also interested in losing some weight. In the past few weeks since she went to the doctor has been working on eating more greens (having a green shake), eating more regularly, and started walking some.    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 Lives with her husband and special needs daughter. She and her husband do the food shopping/meal preparation. Skips about 1 meal per week (used to be more). Eats out about 2 x week.   Does not drink much throughout the day - usually 1-2 cups of coffee and maybe 8 oz water at night. Used to drink diet Dr. Malachi Bonds. Used to graze more throughout the day.    Preferred Learning Style:   No preference indicated   Learning Readiness:   Ready  MEDICATIONS: see list   DIETARY INTAKE:  Usual eating pattern includes 3 meals and 0-1 snacks per day.  Avoided foods include olives, okra, beets, turnips    24-hr recall:  B ( AM): green shake - 1/2 banana, 1/2 apple, almond or cashew milk, kale, spinach, greens supplement with coffee with splenda and creamer Snk ( AM): none  L ( PM): oatmeal with 1/2 cup blueberries, walnuts, agave and stevia  Snk ( PM): none lately D ( PM): zucchini noodles with veggies and tomatoes with 1/2 salmon  Snk ( PM): banana pudding (sugar free) Beverages: coffee, glass of wine (sometimes), 8 oz water with magnesium   Usual physical activity: walking most days for 1 mile (19 minutes)  Estimated energy needs: 1600 calories 180 g carbohydrates 120 g protein 44 g fat  Progress Towards Goal(s):  In progress.   Nutritional Diagnosis:  NB-1.1 Food and nutrition-related knowledge deficit As related to inadequate protein  intake and hx of meal skipping and grazing.  As evidenced by diet recall and elevated fasting blood glucose.    Intervention:  Nutrition counseling provided. Plan: Try adding some protein powder to green smoothie. Add protein (egg) to oatmeal at lunch. Add protein (chicken, fish, beans) to dinner meal. Have carbohydrate on about a quarter of your plate or what you can hold in your hand. Have protein the size of the palm of your hand. If you are hungry, have a snack with protein and carbohydrates (see list). Pay attention to hunger/fullness cues.  Try having meals/snacks at the table without distractions.  Continue walking most days. Increase the amount of time walking when you can.  Look into exercise/yoga class.   Teaching Method Utilized:  Visual Auditory Hands on  Handouts given during visit include:  Meal Card  MyPlate Handout  15 g CHO Snacks  Barriers to learning/adherence to lifestyle change: none  Demonstrated degree of understanding via:  Teach Back   Monitoring/Evaluation:  Dietary intake, exercise, and body weight in 1 month(s).

## 2014-10-10 ENCOUNTER — Ambulatory Visit: Payer: BLUE CROSS/BLUE SHIELD | Admitting: Skilled Nursing Facility1

## 2014-10-23 ENCOUNTER — Encounter: Payer: Self-pay | Admitting: Dietician

## 2014-10-23 ENCOUNTER — Encounter: Payer: BLUE CROSS/BLUE SHIELD | Attending: Family Medicine | Admitting: Dietician

## 2014-10-23 VITALS — Ht 66.5 in | Wt 174.5 lb

## 2014-10-23 DIAGNOSIS — R7309 Other abnormal glucose: Secondary | ICD-10-CM | POA: Diagnosis not present

## 2014-10-23 DIAGNOSIS — Z713 Dietary counseling and surveillance: Secondary | ICD-10-CM | POA: Diagnosis present

## 2014-10-23 DIAGNOSIS — E663 Overweight: Secondary | ICD-10-CM | POA: Diagnosis not present

## 2014-10-23 NOTE — Patient Instructions (Addendum)
Try to eat something every 3-5 hours you are awake.  Continue having protein with all meals/snacks. If you are hungry, have a snack with protein and carbohydrates (see list). Continue to pay attention to hunger/fullness cues.  Try having meals/snacks at the table without distractions when you can.  Continue walking most days or riding bike if you are comfortable.  Look into yoga class.   Keep up the good work!

## 2014-10-23 NOTE — Progress Notes (Signed)
  Medical Nutrition Therapy:  Appt start time: 1100 end time: 1125 .   Assessment:  Primary concerns today: Marisa Orozco is here today since she has a family hx of diabetes and has Pre-Diabetes. Returns with a 4 lb weight loss. Had added protein (Raw Fit) to morning shake. Usually has 2 meals a day. Using plate method for her plate. Doesn't let herself get too hungry. Still having some sweets with meals (sometimes). Not having a lot of snacks. Walking every day for 30 minutes.   Does not drink much throughout the day - usually 1-2 cups of coffee and maybe 8 oz water at night. Used to drink diet Dr. Malachi Bonds. Used to graze more throughout the day.    Drinking another bottle of water each day.   Preferred Learning Style:   No preference indicated   Learning Readiness:   Ready  MEDICATIONS: see list   DIETARY INTAKE:  Usual eating pattern includes 3 meals and 0-1 snacks per day.  Avoided foods include olives, okra, beets, turnips    24-hr recall:  B (10-10:30 AM): green shake - 1/2 banana, 1/2 apple, almond or cashew milk, kale, spinach, greens supplement, with protein powder (Raw Fit) or egg with bacon with coffee with splenda and creamer Snk ( AM): none  L ( PM): [place] with cheese, cranberries, and nuts or [employer] Protein bar or salad or nothing   Snk ( PM): might have blueberries with protein and almond milk sometimes  D ( PM): salad/tomatoes with avocado and salmon or zucchini noodles with veggies and tomatoes with 1/2 salmon or chicken with garlic bread  Snk ( PM): banana pudding (sugar free)with dinner sometimes Beverages: coffee, 1 glass of wine (sometimes), 34 oz water, Diet Dr. Malachi Bonds 2 x week    Usual physical activity: Walking every day for 30 minutes.   Estimated energy needs: 1600 calories 180 g carbohydrates 120 g protein 44 g fat  Progress Towards Goal(s):  In progress.   Nutritional Diagnosis:  NB-1.1 Food and nutrition-related knowledge deficit As related  to inadequate protein intake and hx of meal skipping and grazing.  As evidenced by diet recall and elevated fasting blood glucose.    Intervention:  Nutrition counseling provided. Plan: Try to eat something every 3-5 hours you are awake.  Continue having protein with all meals/snacks. If you are hungry, have a snack with protein and carbohydrates (see list). Continue to pay attention to hunger/fullness cues.  Try having meals/snacks at the table without distractions when you can.  Continue walking most days or riding bike if you are comfortable.  Look into yoga class.   Teaching Method Utilized:  Visual Auditory Hands on  Barriers to learning/adherence to lifestyle change: none  Demonstrated degree of understanding via:  Teach Back   Monitoring/Evaluation:  Dietary intake, exercise, and body weight in 6 week(s).

## 2014-12-05 ENCOUNTER — Ambulatory Visit: Payer: BLUE CROSS/BLUE SHIELD | Admitting: Dietician

## 2014-12-24 ENCOUNTER — Encounter: Payer: BLUE CROSS/BLUE SHIELD | Attending: Family Medicine | Admitting: Dietician

## 2014-12-24 ENCOUNTER — Encounter: Payer: Self-pay | Admitting: Dietician

## 2014-12-24 VITALS — Ht 66.5 in | Wt 171.0 lb

## 2014-12-24 DIAGNOSIS — E663 Overweight: Secondary | ICD-10-CM | POA: Insufficient documentation

## 2014-12-24 DIAGNOSIS — Z713 Dietary counseling and surveillance: Secondary | ICD-10-CM | POA: Insufficient documentation

## 2014-12-24 DIAGNOSIS — R7309 Other abnormal glucose: Secondary | ICD-10-CM | POA: Diagnosis not present

## 2014-12-24 NOTE — Progress Notes (Signed)
  Medical Nutrition Therapy:  Appt start time: 330 end time: 350   Assessment:  Primary concerns today: Marisa Orozco is here today since she has a family hx of diabetes and has Pre-Diabetes. Returns with a 3 lb weight loss. Got off track with walking but got back on. Trying to drink more water. Nurse Practitioner recommended that she drink 48 oz of water per day and to avoid caffeine to help with overactive. This has been hard since she does not like water. Does not think she is eating badly overall.  Trying to eat something every 3-5 hours she is awake though sometimes will skip lunch. Has not looked into yoga class yet.  Will be having blood work drawn this week (to check Hgb A1c).   Preferred Learning Style:   No preference indicated   Learning Readiness:   Ready  MEDICATIONS: see list   DIETARY INTAKE:  Usual eating pattern includes 3 meals and 0-1 snacks per day.  Avoided foods include olives, okra, beets, turnips    24-hr recall:  B (10-10:30 AM): green shake - 1/2 banana, 1/2 apple, almond or cashew milk, kale, spinach, greens supplement, with protein powder (Raw Fit) or egg with bacon with coffee with splenda and creamer Snk ( AM): none  L ( PM): [place] with cheese, cranberries, and nuts or [employer] Protein bar or salad or BLT with white bread or nothing   Snk ( PM): might have blueberries with protein and almond milk sometimes  D ( PM): salad/tomatoes with avocado and salmon or zucchini noodles with veggies and tomatoes with 1/2 salmon or chicken with garlic bread  Snk ( PM): banana pudding (sugar free)with dinner sometimes Beverages: 1-2 cups coffee, 1 glass of wine (sometimes), 34 oz water, 6 oz Diet Dr. Malachi Bonds sometimes  Usual physical activity: Walking 4-5 days per week for 40 minutes (2.4-2.5 miles).   Estimated energy needs: 1600 calories 180 g carbohydrates 120 g protein 44 g fat  Progress Towards Goal(s):  In progress.   Nutritional Diagnosis:  NB-1.1  Food and nutrition-related knowledge deficit As related to inadequate protein intake and hx of meal skipping and grazing.  As evidenced by diet recall and elevated fasting blood glucose.    Intervention:  Nutrition counseling provided. Plan: Continue to eat something every 3-5 hours you are awake.  Continue having protein with all meals/snacks. If you are hungry, have a snack with protein and carbohydrates (see list). Continue to pay attention to hunger/fullness cues.  Try having meals/snacks at the table without distractions if you are feel like are overeating. Continue walking most days. Increase when you can to 45 minutes. Look into yoga class. ([employer], etc.) Have 3 water bottle per day and limit caffeine (per doctors orders).  Teaching Method Utilized:  Visual Auditory Hands on  Barriers to learning/adherence to lifestyle change: none  Demonstrated degree of understanding via:  Teach Back   Monitoring/Evaluation:  Dietary intake, exercise, and body weight in 2 month(s).

## 2014-12-24 NOTE — Patient Instructions (Addendum)
Continue to eat something every 3-5 hours you are awake.  Continue having protein with all meals/snacks. If you are hungry, have a snack with protein and carbohydrates (see list). Continue to pay attention to hunger/fullness cues.  Try having meals/snacks at the table without distractions if you are feel like are overeating. Continue walking most days. Increase when you can to 45 minutes. Look into yoga class. ([employer], etc.) Have 3 water bottle per day and limit caffeine (per doctors orders).

## 2015-02-25 ENCOUNTER — Encounter: Payer: BLUE CROSS/BLUE SHIELD | Attending: Family Medicine | Admitting: Dietician

## 2015-02-25 ENCOUNTER — Encounter: Payer: Self-pay | Admitting: Dietician

## 2015-02-25 VITALS — Wt 171.3 lb

## 2015-02-25 DIAGNOSIS — Z713 Dietary counseling and surveillance: Secondary | ICD-10-CM | POA: Diagnosis present

## 2015-02-25 NOTE — Progress Notes (Signed)
Medical Nutrition Therapy:  Appt start time: 230 end time: 310   Assessment:  Primary concerns today: Marisa Orozco is here today since she has a family hx of diabetes and has Pre-Diabetes. Returns with a no weight change. "[place] off the wagon" a bit since the last visit so happy that she did not gain weight. Getting tired of salads at meals. Tends to like to graze more than eat meals. Will have up to 5-6 handfuls of nuts throughout the day. Used to smoke which is why she thinks she grazes.  Tried yoga at [employer] liked it. Planning to go back tomorrow. Got off track with walking but started back in the past week and a half. Trying to drink more water and having seltzers with Dasani flavor drops since she is not liking regular water.   Had a doctor's visit in late September and had lost 15 lbs since May. Fasting glucose is now at 100 mg/dl and down from 117 mg/dl though Hgb A1c is 6.1% and was 6.0% in May.    Wt Readings from Last 3 Encounters:  02/25/15 171 lb 4.8 oz (77.701 kg)  12/24/14 171 lb (77.565 kg)  10/23/14 174 lb 8 oz (79.153 kg)   Ht Readings from Last 3 Encounters:  12/24/14 5' 6.5" (1.689 m)  10/23/14 5' 6.5" (1.689 m)  09/11/14 5' 6.5" (1.689 m)   Body mass index is 27.24 kg/(m^2). '@BMIFA'$ @ Normalized weight-for-age data available only for age 92 to 77 years. Normalized stature-for-age data available only for age 92 to 7 years.   Preferred Learning Style:   No preference indicated   Learning Readiness:   Ready  MEDICATIONS: see list   DIETARY INTAKE:  Usual eating pattern includes 3 meals and 0-1 snacks per day.  Avoided foods include olives, okra, beets, turnips    24-hr recall:  B (10-10:30 AM): green shake - 1/2 banana, 1/2 apple, cashew milk, kale, spinach, greens supplement, with protein powder (Raw Fit) or egg with bacon with coffee with splenda and creamer Snk ( AM): none  L ( PM): [place] with cheese, cranberries, and nuts or [employer] Protein bar  or salad or nothing (not often) Snk ( PM): might have blueberries with protein and almond milk sometimes, nuts throughout the day  D ( PM): salad/tomatoes with avocado and salmon or zucchini noodles with veggies and tomatoes with 1/2 salmon or chicken with garlic bread  Snk ( PM): none or might have a sweet sometimes or sugar free popsicles Beverages: 1-2 cups coffee, 1 glass of wine (sometimes), 24-36 oz sparkling water, 6 oz Diet Dr. Malachi Bonds sometimes  Usual physical activity: Walking 3-5 days per week for 45 minutes (2.7-2.8 miles)  Estimated energy needs: 1600 calories 180 g carbohydrates 120 g protein 44 g fat  Progress Towards Goal(s):  In progress.   Nutritional Diagnosis:  NB-1.1 Food and nutrition-related knowledge deficit As related to inadequate protein intake and hx of meal skipping and grazing.  As evidenced by diet recall and elevated fasting blood glucose.    Intervention:  Nutrition counseling provided. Plan: Continue to eat something every 3-5 hours you are awake.  Continue having protein with all meals/snacks. If you are hungry, have a snack with protein and carbohydrates (see list). Continue to pay attention to hunger/fullness cues.  Try having meals/snacks at the table without distractions if you are feel like are overeating. Continue walking most days. Increase when you can to 45 minutes. Continue to do yoga class.  Have 3  water bottles (or seltzer) per day. Think about adding fruit to water for flavor.  Try roasting some vegetables (brussels sprouts, broccoli, cauliflower) on 400 degrees for about 30 minutes (check every 10 minutes).  Goal for holidays: no weight gain.  Teaching Method Utilized:  Visual Auditory Hands on  Barriers to learning/adherence to lifestyle change: none  Demonstrated degree of understanding via:  Teach Back   Monitoring/Evaluation:  Dietary intake, exercise, and body weight in 2 month(s).

## 2015-02-25 NOTE — Patient Instructions (Addendum)
Continue to eat something every 3-5 hours you are awake.  Continue having protein with all meals/snacks. If you are hungry, have a snack with protein and carbohydrates (see list). Continue to pay attention to hunger/fullness cues.  Try having meals/snacks at the table without distractions if you are feel like are overeating. Continue walking most days. Increase when you can to 45 minutes. Continue to do yoga class.  Have 3 water bottles (or seltzer) per day. Think about adding fruit to water for flavor.  Try roasting some vegetables (brussels sprouts, broccoli, cauliflower) on 400 degrees for about 30 minutes (check every 10 minutes).  Goal for holidays: no weight gain.

## 2015-04-03 ENCOUNTER — Encounter: Payer: Self-pay | Admitting: Cardiology

## 2015-04-03 ENCOUNTER — Ambulatory Visit (INDEPENDENT_AMBULATORY_CARE_PROVIDER_SITE_OTHER): Payer: BLUE CROSS/BLUE SHIELD | Admitting: Cardiology

## 2015-04-03 VITALS — BP 128/78 | HR 65 | Ht 66.0 in | Wt 175.1 lb

## 2015-04-03 DIAGNOSIS — R002 Palpitations: Secondary | ICD-10-CM | POA: Diagnosis not present

## 2015-04-03 NOTE — Patient Instructions (Signed)
Medication Instructions:  The current medical regimen is effective;  continue present plan and medications. Please avoid caffeine, decongestants and type of stimulates to help prevent palpitations.  Follow-Up: Follow up as needed.  If you need a refill on your cardiac medications before your next appointment, please call your pharmacy.  Thank you for choosing [facility]!!

## 2015-04-03 NOTE — Progress Notes (Signed)
Cardiology Office Note   Date:  04/03/2015   ID:  GEORJEAN TOYA, DOB 1950-08-10, MRN [id number]  PCP:  Gar Ponto, MD  Cardiologist:   Candee Furbish, MD     History of Present Illness: Marisa Orozco is a 64 y.o. female who presents for  Evaluation of palpitations at the request of Dr. Olena Heckle.    she states that right after her flu shot , she noted that she was having increasing palpitations mostly at night, no chest pain, no shortness breath, no prolonged racing of heart. Her heart felt as though it was flipping. After her flu shot she did note some increased swelling in her lymph nodes and a generally increased reaction this year.    she has not changed her eating habits/caffeine use, 1-2 cups of coffee a day. She does not drink any high energy drinks. She did not take any Sudafed or other decongestants /stimulants.    she takes care of her 64 year old handicapped daughter who has trouble sleeping through the night.    since September when she had her flu shot, her palpitations have much improved.  Mother smoked, COPD, heart issue.   No prior MI. Prior smoker, quit. Palps when smoked. Went away after quitting. No syncope, no CP, no SOB.   Flutter, flip.    Past Medical History  Diagnosis Date  . PONV (postoperative nausea and vomiting)   . GERD (gastroesophageal reflux disease)     otc med prn  . Cancer ([place])     basal cell carcinoma - face  . Allergy     Past Surgical History  Procedure Laterality Date  . Svd      x 2  . Tonsillectomy    . Bunionectomy      right foot  . Breast surgery      augmentation  . Nasal septum surgery    . Diagnostic laparoscopy    . Colonoscopy    . Wisdom tooth extraction    . Dilation and curettage of uterus  04/15/2011    Procedure: DILATATION AND CURETTAGE;  Surgeon: [name];  Location: [facility];  Service: Gynecology;  Laterality: N/A;     Current Outpatient Prescriptions  Medication Sig Dispense Refill  .  Cholecalciferol (VITAMIN D3) 1000 UNIT/SPRAY LIQD Take 5,000 sprays by mouth daily.    . Melatonin 5 MG TABS Take 1 tablet by mouth daily.     . nitrofurantoin, macrocrystal-monohydrate, (MACROBID) 100 MG capsule Take 100 mg by mouth 2 (two) times daily as needed.     Marland Kitchen PRESCRIPTION MEDICATION Apply 0.5 g topically daily. Estrogen cream '8mg'$ /gm    . PRESCRIPTION MEDICATION Apply 1 g/day topically daily. Progesterone 30 mg/gm cream    . progesterone (PROMETRIUM) 100 MG capsule Take 100 mg by mouth at bedtime.     No current facility-administered medications for this visit.    Allergies:   Codeine; Esomeprazole magnesium; Flagyl; and Sulfa drugs cross reactors    Social History:  The patient  reports that she quit smoking about 11 years ago. Her smoking use included Cigarettes. She has a 25 pack-year smoking history. She has never used smokeless tobacco. She reports that she drinks alcohol. She reports that she does not use illicit drugs.   Family History:  The patient's family history includes COPD in her mother; Diabetes in her father; Heart disease in her mother; Kidney disease in her father. There is no history of Colon cancer, Esophageal cancer, Stomach cancer, or  Rectal cancer.    ROS:  Please see the history of present illness.   Otherwise, review of systems are positive for none.   All other systems are reviewed and negative.    PHYSICAL EXAM: VS:  BP 128/78 mmHg  Pulse 65  Ht '5\' 6"'$  (1.676 m)  Wt 175 lb 1.9 oz (79.434 kg)  BMI 28.28 kg/m2 , BMI Body mass index is 28.28 kg/(m^2). GEN: Well nourished, well developed, in no acute distress HEENT: normal Neck: no JVD, carotid bruits, or masses Cardiac: RRR; no murmurs, rubs, or gallops,no edema  Respiratory:  clear to auscultation bilaterally, normal work of breathing GI: soft, nontender, nondistended, + BS MS: no deformity or atrophy Skin: warm and dry, no rash Neuro:  Strength and sensation are intact Psych: euthymic mood, full  affect   EKG:  EKG is ordered today. The ekg ordered today  04/03/15 demonstrates  Sinus rhythm, 65, no other abnormalities. No change when compared to prior EKG supplied by Dr. Olena Heckle on 02/21/15.   Recent Labs: No results found for requested labs within last 365 days.   prior thyroid evaluation normal per patient   Lipid Panel No results found for: CHOL, TRIG, HDL, CHOLHDL, VLDL, LDLCALC, LDLDIRECT    Wt Readings from Last 3 Encounters:  04/03/15 175 lb 1.9 oz (79.434 kg)  02/25/15 171 lb 4.8 oz (77.701 kg)  12/24/14 171 lb (77.565 kg)      Other studies Reviewed: Additional studies/ records that were reviewed today include:  Prior office note, EKG reviewed. Review of the above records demonstrates:  As above   ASSESSMENT AND PLAN:  Palpitations  -  These have subsided, improved. Likely PVCs or PACs. She's had a prior evaluation in 2004 which was unremarkable.  -  Avoid excessive caffeine. She drinks 1-2 cups of coffee a day. Avoid energy drinks.  -   She takes care of her 64 year old handicapped daughter who has trouble sleeping most nights. It is challenging for her to get good nights rest.  -  She is describing no high risk symptoms associated with palpitations, no syncope, no angina, no shortness of breath. Physical exam is unremarkable. She was concerned that potentially thyroid abnormality may the present however Dr. Olena Heckle recently checked thyroid and this was normal.  -  If she is having significant palpitations, one could consider low-dose beta blocker but I do not believe that she needs any medication prescription at this time. She does not wish to take medications as well.  -  I do not feel strongly that a Holter monitor is necessary at this time.  -  Reassurance has been given. Please let us know if we can be of further assistance.   Current medicines are reviewed at length with the patient today.  The patient does not have concerns regarding medicines.  The  following changes have been made:  no change  Labs/ tests ordered today include: none  No orders of the defined types were placed in this encounter.     Disposition:   FU with Marlou Porch PRN  Signed, Candee Furbish, MD  04/03/2015 11:37 AM    [facility] [address], [place], [place]  [address] Phone: [phone]; Fax: [phone]

## 2015-04-05 ENCOUNTER — Encounter: Payer: Self-pay | Admitting: Cardiology

## 2015-04-29 ENCOUNTER — Ambulatory Visit: Payer: BLUE CROSS/BLUE SHIELD | Admitting: Skilled Nursing Facility1

## 2015-05-07 ENCOUNTER — Encounter: Payer: BLUE CROSS/BLUE SHIELD | Attending: Family Medicine | Admitting: Skilled Nursing Facility1

## 2015-05-07 ENCOUNTER — Encounter: Payer: Self-pay | Admitting: Skilled Nursing Facility1

## 2015-05-07 VITALS — Ht 66.0 in | Wt 175.0 lb

## 2015-05-07 DIAGNOSIS — Z713 Dietary counseling and surveillance: Secondary | ICD-10-CM | POA: Diagnosis present

## 2015-05-07 DIAGNOSIS — E669 Obesity, unspecified: Secondary | ICD-10-CM

## 2015-05-07 NOTE — Progress Notes (Signed)
  Medical Nutrition Therapy:  Appt start time: 237 end time: 307   Assessment:  Pt is here for her 5th follow up appointment for obesity. Pt states she "fell off the wagon" with her physical activity, portion sizes, and types of foods. Pt states she was disappointed/upset for not maintaining 171 pounds.  Pt was not receptive to Dietitians education or the teach back method. Pt was visibly offended she was asked to verbalize a proper meal/snack. Pt states, "you are confrontational and having someone observe my appointment is confrontational." The pt was referring to the dietetic intern observing the appointment. 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  I do NOT like to be physically active."  Dietitian profusely apologized for the perceived notion of confrontational language/body language. As the appointment continued on the pt states she wants a special care giver exercise class and at the clarifying question asked by the dietitian of: "Are you looking for a class you can bring your daughter to?" The pt got agitated and raised her voice to say to the dietitian, "my daughter is handicapped and can NEVER leave the house!"  Pt states, "I know how to eat........  I am going to continue my smoothie" in response to other plausible suggestions for well balanced meals. Pt left the appointment stating, "thank you, this was helpful" in a pleasant tone.   Pt was not interested in offering any information needed for a proper assessment including: dietary recall and specific physical activity routine.

## 2015-10-22 ENCOUNTER — Other Ambulatory Visit: Payer: Self-pay | Admitting: Obstetrics & Gynecology

## 2015-10-24 LAB — CYTOLOGY - PAP

## 2015-12-10 DIAGNOSIS — R899 Unspecified abnormal finding in specimens from other organs, systems and tissues: Secondary | ICD-10-CM | POA: Diagnosis not present

## 2015-12-10 DIAGNOSIS — R509 Fever, unspecified: Secondary | ICD-10-CM | POA: Diagnosis not present

## 2015-12-10 DIAGNOSIS — Z6826 Body mass index (BMI) 26.0-26.9, adult: Secondary | ICD-10-CM | POA: Diagnosis not present

## 2015-12-17 DIAGNOSIS — R768 Other specified abnormal immunological findings in serum: Secondary | ICD-10-CM | POA: Diagnosis not present

## 2015-12-17 DIAGNOSIS — M25561 Pain in right knee: Secondary | ICD-10-CM | POA: Diagnosis not present

## 2015-12-17 DIAGNOSIS — M3500 Sicca syndrome, unspecified: Secondary | ICD-10-CM | POA: Diagnosis not present

## 2015-12-17 DIAGNOSIS — Z09 Encounter for follow-up examination after completed treatment for conditions other than malignant neoplasm: Secondary | ICD-10-CM | POA: Diagnosis not present

## 2015-12-18 ENCOUNTER — Telehealth: Payer: Self-pay | Admitting: Cardiology

## 2015-12-18 DIAGNOSIS — M3310 Other dermatopolymyositis, organ involvement unspecified: Secondary | ICD-10-CM | POA: Diagnosis not present

## 2015-12-18 DIAGNOSIS — R9431 Abnormal electrocardiogram [ECG] [EKG]: Secondary | ICD-10-CM

## 2015-12-18 DIAGNOSIS — L986 Other infiltrative disorders of the skin and subcutaneous tissue: Secondary | ICD-10-CM | POA: Diagnosis not present

## 2015-12-18 DIAGNOSIS — Z85828 Personal history of other malignant neoplasm of skin: Secondary | ICD-10-CM | POA: Diagnosis not present

## 2015-12-18 DIAGNOSIS — M3500 Sicca syndrome, unspecified: Secondary | ICD-10-CM

## 2015-12-18 NOTE — Telephone Encounter (Signed)
Spoke w/Dr. Marlou Porch.  He suggested that she get an Echo to evaluate heart function and then he would see her in office.  Sent order for Echo. Left detailed message on verified voice mail that someone would call her for Echo and then Dr. Marlou Porch would see her in the office. Echo dx is Sjogrens disease; tachycardia.

## 2015-12-18 NOTE — Telephone Encounter (Signed)
Calling stating she was seen by Dr. Estanislado Pandy yesterday and was dx with Sjogrens syndrome.  An auto immune disease that effects joints and the heart.  Dr. Estanislado Pandy is concerned that it is already effecting heart because she was Tachycardiac in office yesterday (HR was 110); is retaining fluid.  Dr. Estanislado Pandy wanted her to be seen ASAP. Advised will forward to Dr. Marlou Porch to see what his recommendation would be.

## 2015-12-18 NOTE — Telephone Encounter (Signed)
New message       Pt saw Dr Estanislado Pandy, her rheumotlogist, recently and was diagnosed with something that can affect her heart.  She will have Dr Arlean Hopping office fax the notes to Dr Marlou Porch.  Pt want to be seen by Dr Marlou Porch ASAP.  She did not want to see an app.  I offered her an appt on 9-27.  Pt said she needed to be seen sooner.  Pt request nurse call her.

## 2015-12-19 ENCOUNTER — Telehealth: Payer: Self-pay | Admitting: Cardiology

## 2015-12-19 NOTE — Telephone Encounter (Signed)
Reviewed information with Dr Marlou Porch who states pt does need a 2 D Echo but it is not urgent.  OK to schedule for next available unless pt develops further s/s.  Regina in echo scheduling aware and will call pt to schedule.

## 2015-12-19 NOTE — Telephone Encounter (Signed)
New message    Pt calling to get a Echo scheduled today or tomorrow per the message left by Derek Mound. I offered 9/26 but pt declined b/c she was told that she could get an appt earlier. Please call.

## 2015-12-20 ENCOUNTER — Other Ambulatory Visit (HOSPITAL_COMMUNITY): Payer: Self-pay | Admitting: Rheumatology

## 2015-12-20 DIAGNOSIS — M791 Myalgia: Secondary | ICD-10-CM | POA: Diagnosis not present

## 2015-12-20 DIAGNOSIS — R21 Rash and other nonspecific skin eruption: Secondary | ICD-10-CM | POA: Diagnosis not present

## 2015-12-20 DIAGNOSIS — M2550 Pain in unspecified joint: Secondary | ICD-10-CM | POA: Diagnosis not present

## 2015-12-20 DIAGNOSIS — R6889 Other general symptoms and signs: Secondary | ICD-10-CM | POA: Diagnosis not present

## 2015-12-20 DIAGNOSIS — Z9225 Personal history of immunosupression therapy: Secondary | ICD-10-CM | POA: Diagnosis not present

## 2015-12-20 DIAGNOSIS — E559 Vitamin D deficiency, unspecified: Secondary | ICD-10-CM | POA: Diagnosis not present

## 2015-12-20 DIAGNOSIS — R5381 Other malaise: Secondary | ICD-10-CM | POA: Diagnosis not present

## 2015-12-20 DIAGNOSIS — M3509 Sicca syndrome with other organ involvement: Secondary | ICD-10-CM | POA: Diagnosis not present

## 2015-12-23 ENCOUNTER — Ambulatory Visit (HOSPITAL_COMMUNITY): Payer: Medicare Other | Attending: Cardiology

## 2015-12-23 ENCOUNTER — Other Ambulatory Visit (HOSPITAL_COMMUNITY): Payer: Self-pay | Admitting: Rheumatology

## 2015-12-23 ENCOUNTER — Other Ambulatory Visit: Payer: Self-pay

## 2015-12-23 DIAGNOSIS — R52 Pain, unspecified: Secondary | ICD-10-CM

## 2015-12-23 DIAGNOSIS — R9431 Abnormal electrocardiogram [ECG] [EKG]: Secondary | ICD-10-CM

## 2015-12-23 DIAGNOSIS — M3500 Sicca syndrome, unspecified: Secondary | ICD-10-CM | POA: Diagnosis not present

## 2015-12-23 DIAGNOSIS — I517 Cardiomegaly: Secondary | ICD-10-CM | POA: Diagnosis not present

## 2015-12-23 DIAGNOSIS — I071 Rheumatic tricuspid insufficiency: Secondary | ICD-10-CM | POA: Diagnosis not present

## 2015-12-23 NOTE — Telephone Encounter (Signed)
Pt had 2 D Echo today that was normal.  Left message at home # to c/b for results.

## 2015-12-23 NOTE — Telephone Encounter (Signed)
error 

## 2015-12-24 ENCOUNTER — Telehealth: Payer: Self-pay | Admitting: Cardiology

## 2015-12-24 NOTE — Telephone Encounter (Signed)
Called pt with echo results below:   Notes Recorded by Jerline Pain, MD on 12/23/2015 at 4:34 PM EDT Normal ECHO. Reassuring.    After reviewing normal echo results, pt requested an appointment, due to tachycardia. Pt said her heart rates range from 99-112. I explained HR 60-100 is normal, and > 100 is tachycardic. Pt said she has slight swelling in her knees. Pt stopped Prednisone 2 weeks ago. I explained this medication can cause tachycardia and swelling. No other symptoms noted. Pt requested an appointment.  Made pt appt on 01/01/16 at 9:00 with Cecilie Kicks, NP. Informed pt to call back if symptoms get worse or change. Pt was pleasant and agreed with plan.

## 2015-12-24 NOTE — Telephone Encounter (Signed)
F/u Message  Pt returning RN call about echo results. Please call back to discuss

## 2015-12-30 DIAGNOSIS — M3390 Dermatopolymyositis, unspecified, organ involvement unspecified: Secondary | ICD-10-CM | POA: Diagnosis not present

## 2015-12-31 ENCOUNTER — Other Ambulatory Visit: Payer: Self-pay | Admitting: Rheumatology

## 2015-12-31 ENCOUNTER — Encounter: Payer: Self-pay | Admitting: Cardiology

## 2015-12-31 ENCOUNTER — Other Ambulatory Visit (HOSPITAL_COMMUNITY): Payer: BLUE CROSS/BLUE SHIELD

## 2015-12-31 DIAGNOSIS — C801 Malignant (primary) neoplasm, unspecified: Secondary | ICD-10-CM

## 2015-12-31 NOTE — Progress Notes (Signed)
Cardiology Office Note   Date:  01/01/2016   ID:  Marisa Orozco, DOB [birth year], MRN [id number]  PCP:  Gar Ponto, MD  Cardiologist:  Dr. Marlou Porch    Chief Complaint  Patient presents with  . Palpitations      History of Present Illness: Marisa Orozco is a 65 y.o. female who presents for HR 99-112 and swelling in her knees.  Recent dx of Sjogrens syndrome.     She has a hx of palpitations.  She was instructed to avoid/decrease caffeine.  Her EKG in past  was stable.      With recent Dx she had Echo which was normal.  EF 65-70%, G1DD, LA was normal in size.    Today her HR is 110, she has no awareness of HR only when she checks her pulse does she become aware of rate..  No chest pain and no SOB.   Minimal ankle edema that is improving off prednisone.  She continues with low grade fevers, beginning 11/29/15 at 102 and continuing but now 100.2  Her EKG is stable.  Her recent Echo was normal.    For Lt deltoid muscle biopsy on the 28th. For dermatomyositis.   Past Medical History:  Diagnosis Date  . Allergy   . Cancer ([place])    basal cell carcinoma - face  . GERD (gastroesophageal reflux disease)    otc med prn  . PONV (postoperative nausea and vomiting)     Past Surgical History:  Procedure Laterality Date  . BREAST SURGERY     augmentation  . BUNIONECTOMY     right foot  . COLONOSCOPY    . DIAGNOSTIC LAPAROSCOPY    . DILATION AND CURETTAGE OF UTERUS  04/15/2011   Procedure: DILATATION AND CURETTAGE;  Surgeon: [name];  Location: [facility];  Service: Gynecology;  Laterality: N/A;  . NASAL SEPTUM SURGERY    . SVD     x 2  . TONSILLECTOMY    . WISDOM TOOTH EXTRACTION       Current Outpatient Prescriptions  Medication Sig Dispense Refill  . Cholecalciferol (VITAMIN D3) 1000 UNIT/SPRAY LIQD Take 5,000 sprays by mouth daily.    . Flaxseed Oil OIL Take 15 mLs by mouth daily.    . hydroxychloroquine (PLAQUENIL) 200 MG tablet Take 100-200 mg by mouth See admin  instructions. Pt takes '200mg'$  in am, '100mg'$  in pm    . ibuprofen (ADVIL,MOTRIN) 200 MG tablet Take 200 mg by mouth every 6 (six) hours as needed for moderate pain.    Marland Kitchen VITAMIN K PO Take 1 tablet by mouth daily.     No current facility-administered medications for this visit.     Allergies:   Codeine; Esomeprazole magnesium; Flagyl [metronidazole hcl]; and Sulfa drugs cross reactors    Social History:  The patient  reports that she quit smoking about 12 years ago. Her smoking use included Cigarettes. She has a 25.00 pack-year smoking history. She has never used smokeless tobacco. She reports that she drinks alcohol. She reports that she does not use drugs.   Family History:  The patient's family history includes COPD in her mother; Diabetes in her father; Heart disease in her mother; Kidney disease in her father.    ROS:  General:no colds or fevers, no weight changes Skin:+ rashes no ulcers HEENT:no blurred vision, no congestion CV:see HPI PUL:see HPI GI:no diarrhea constipation or melena, no indigestion GU:no hematuria, no dysuria MS:no joint pain, no claudication, Sjogren's and possible  dermatomyositis. Neuro:no syncope, no lightheadedness Endo:no diabetes, no thyroid disease  Wt Readings from Last 3 Encounters:  01/01/16 167 lb 12.8 oz (76.1 kg)  05/07/15 175 lb (79.4 kg)  04/03/15 175 lb 1.9 oz (79.4 kg)     PHYSICAL EXAM: VS:  BP 120/80   Pulse (!) 113   Ht '5\' 6"'$  (1.676 m)   Wt 167 lb 12.8 oz (76.1 kg)   SpO2 95%   BMI 27.08 kg/m  , BMI Body mass index is 27.08 kg/m. General:Pleasant affect, NAD Skin:Warm and dry, brisk capillary refill HEENT:normocephalic, sclera clear, mucus membranes moist Neck:supple, no JVD, no bruits  Heart:S1S2 RRR without murmur, gallup, rub or click Lungs:clear without rales, rhonchi, or wheezes VOH:YWVP, non tender, + BS, do not palpate liver spleen or masses Ext:no lower ext edema, 2+ pedal pulses, 2+ radial pulses Neuro:alert and  oriented X 3, MAE, follows commands, + facial symmetry    EKG:  EKG is ordered today. The ekg ordered today demonstrates ST at 110 but no changes otherwise.    Recent Labs: No results found for requested labs within last 8760 hours.    Lipid Panel No results found for: CHOL, TRIG, HDL, CHOLHDL, VLDL, LDLCALC, LDLDIRECT     Other studies Reviewed: Additional studies/ records that were reviewed today include: . ECHO:  Study Conclusions  - Left ventricle: The cavity size was normal. There was mild focal   basal hypertrophy of the septum. Systolic function was vigorous.   The estimated ejection fraction was in the range of 65% to 70%.   Wall motion was normal; there were no regional wall motion   abnormalities. Doppler parameters are consistent with abnormal   left ventricular relaxation (grade 1 diastolic dysfunction).   Doppler parameters are consistent with elevated ventricular   end-diastolic filling pressure. - Aortic valve: Trileaflet; normal thickness leaflets.   Transvalvular velocity was within the normal range. There was no   stenosis. There was no regurgitation. - Aortic root: The aortic root was normal in size. - Ascending aorta: The ascending aorta was normal in size. - Mitral valve: Structurally normal valve. There was no   regurgitation. - Left atrium: The atrium was normal in size. - Right ventricle: The cavity size was normal. Wall thickness was   normal. Systolic function was normal. - Tricuspid valve: There was trivial regurgitation. - Pulmonic valve: There was no regurgitation. - Pulmonary arteries: The main pulmonary artery was normal-sized.   Systolic pressure was within the normal range. - Inferior vena cava: The vessel was normal in size. - Pericardium, extracardiac: There was no pericardial effusion.  ASSESSMENT AND PLAN:  1.  Sinus tach reassured at rate of 110 and asymptomatic not to worry but if she begins to have heart racing or dizziness  to call at that time would add holter and possible BB.  Would hesitate to add now while new meds are being added and she is asymptomatic.   I will have her follow up in 6 months.  Ok to exercise.    2. Sjogren's with normal Echo  3. Dermatomyositis. For deltoid muscle biopsy on Friday.    Current medicines are reviewed with the patient today.  The patient Has no concerns regarding medicines.  The following changes have been made:  See above Labs/ tests ordered today include:see above  Disposition:   FU:  see above  Signed, Cecilie Kicks, NP  01/01/2016 9:46 AM    [facility],  [place], [place] Phone: [phone]; Fax: [phone]

## 2016-01-01 ENCOUNTER — Encounter (HOSPITAL_COMMUNITY): Payer: Self-pay | Admitting: Anesthesiology

## 2016-01-01 ENCOUNTER — Other Ambulatory Visit: Payer: Self-pay | Admitting: Rheumatology

## 2016-01-01 ENCOUNTER — Encounter: Payer: Self-pay | Admitting: Cardiology

## 2016-01-01 ENCOUNTER — Encounter (INDEPENDENT_AMBULATORY_CARE_PROVIDER_SITE_OTHER): Payer: Self-pay

## 2016-01-01 ENCOUNTER — Ambulatory Visit (INDEPENDENT_AMBULATORY_CARE_PROVIDER_SITE_OTHER): Payer: Medicare Other | Admitting: Cardiology

## 2016-01-01 ENCOUNTER — Encounter (HOSPITAL_COMMUNITY): Payer: Self-pay | Admitting: *Deleted

## 2016-01-01 VITALS — BP 120/80 | HR 113 | Ht 66.0 in | Wt 167.8 lb

## 2016-01-01 DIAGNOSIS — R002 Palpitations: Secondary | ICD-10-CM | POA: Diagnosis not present

## 2016-01-01 DIAGNOSIS — M3500 Sicca syndrome, unspecified: Secondary | ICD-10-CM | POA: Diagnosis not present

## 2016-01-01 DIAGNOSIS — R000 Tachycardia, unspecified: Secondary | ICD-10-CM | POA: Diagnosis not present

## 2016-01-01 DIAGNOSIS — Z111 Encounter for screening for respiratory tuberculosis: Secondary | ICD-10-CM | POA: Diagnosis not present

## 2016-01-01 DIAGNOSIS — M359 Systemic involvement of connective tissue, unspecified: Secondary | ICD-10-CM

## 2016-01-01 NOTE — Progress Notes (Signed)
Unable to reach pt via phone for pre-op call. Left pre-op instructions according to Pre-op Call Checklist on pt's voicemail

## 2016-01-01 NOTE — Anesthesia Preprocedure Evaluation (Deleted)
Anesthesia Evaluation  Patient identified by MRN, date of birth, ID band Patient awake    Reviewed: Allergy & Precautions, NPO status , Patient's Chart, lab work & pertinent test results  History of Anesthesia Complications (+) PONV and history of anesthetic complications  Airway        Dental   Pulmonary former smoker,           Cardiovascular + dysrhythmias (sinus tachycardia)   EKG 01/01/2016: Sinus tachycardia (HR 110)  TTE 12/23/2015: Study Conclusions  - Left ventricle: The cavity size was normal. There was mild focal   basal hypertrophy of the septum. Systolic function was vigorous.   The estimated ejection fraction was in the range of 65% to 70%.   Wall motion was normal; there were no regional wall motion   abnormalities. Doppler parameters are consistent with abnormal   left ventricular relaxation (grade 1 diastolic dysfunction).   Doppler parameters are consistent with elevated ventricular   end-diastolic filling pressure. - Aortic valve: Trileaflet; normal thickness leaflets.   Transvalvular velocity was within the normal range. There was no   stenosis. There was no regurgitation. - Aortic root: The aortic root was normal in size. - Ascending aorta: The ascending aorta was normal in size. - Mitral valve: Structurally normal valve. There was no   regurgitation. - Left atrium: The atrium was normal in size. - Right ventricle: The cavity size was normal. Wall thickness was   normal. Systolic function was normal. - Tricuspid valve: There was trivial regurgitation. - Pulmonic valve: There was no regurgitation. - Pulmonary arteries: The main pulmonary artery was normal-sized.   Systolic pressure was within the normal range. - Inferior vena cava: The vessel was normal in size. - Pericardium, extracardiac: There was no pericardial effusion.   Neuro/Psych    GI/Hepatic GERD  ,  Endo/Other    Renal/GU       Musculoskeletal   Abdominal   Peds  Hematology   Anesthesia Other Findings Sjogren's syndrome, dermatomyositis  Reproductive/Obstetrics                             Anesthesia Physical Anesthesia Plan  ASA: II  Anesthesia Plan:    Post-op Pain Management:    Induction:   Airway Management Planned:   Additional Equipment:   Intra-op Plan:   Post-operative Plan:   Informed Consent:   Plan Discussed with:   Anesthesia Plan Comments:         Anesthesia Quick Evaluation

## 2016-01-01 NOTE — Patient Instructions (Signed)
Medication Instructions:   Your physician recommends that you continue on your current medications as directed. Please refer to the Current Medication list given to you today.    If you need a refill on your cardiac medications before your next appointment, please call your pharmacy.  Labwork: NONE ORDER TODAY    Testing/Procedures: NONE ORDER TODAY    Follow-Up: Your physician wants you to follow-up in:  IN  6  MONTHS WITH DR  Marlou Porch You will receive a reminder letter in the mail two months in advance. If you don't receive a letter, please call our office to schedule the follow-up appointment.      Any Other Special Instructions Will Be Listed Below (If Applicable).

## 2016-01-02 ENCOUNTER — Encounter (HOSPITAL_BASED_OUTPATIENT_CLINIC_OR_DEPARTMENT_OTHER)
Admission: RE | Disposition: A | Discharge status: Home / Self Care | Payer: Self-pay | Source: Ambulatory Visit | Attending: General Surgery

## 2016-01-02 ENCOUNTER — Ambulatory Visit (HOSPITAL_BASED_OUTPATIENT_CLINIC_OR_DEPARTMENT_OTHER)
Admission: RE | Admit: 2016-01-02 | Discharge: 2016-01-02 | Disposition: A | Discharge status: Home / Self Care | Payer: Medicare Other | Source: Ambulatory Visit | Attending: General Surgery | Admitting: General Surgery

## 2016-01-02 ENCOUNTER — Ambulatory Visit (HOSPITAL_COMMUNITY)
Admission: RE | Admit: 2016-01-02 | Discharge: 2016-01-02 | Disposition: A | Discharge status: Home / Self Care | Payer: Medicare Other | Source: Ambulatory Visit | Attending: General Surgery | Admitting: General Surgery

## 2016-01-02 ENCOUNTER — Encounter (HOSPITAL_BASED_OUTPATIENT_CLINIC_OR_DEPARTMENT_OTHER): Payer: Self-pay | Admitting: *Deleted

## 2016-01-02 DIAGNOSIS — Z79899 Other long term (current) drug therapy: Secondary | ICD-10-CM | POA: Insufficient documentation

## 2016-01-02 DIAGNOSIS — M3390 Dermatopolymyositis, unspecified, organ involvement unspecified: Secondary | ICD-10-CM | POA: Insufficient documentation

## 2016-01-02 DIAGNOSIS — Z85820 Personal history of malignant melanoma of skin: Secondary | ICD-10-CM | POA: Insufficient documentation

## 2016-01-02 HISTORY — PX: MUSCLE BIOPSY: SHX716

## 2016-01-02 HISTORY — DX: Sjogren syndrome, unspecified: M35.00

## 2016-01-02 SURGERY — MUSCLE BIOPSY
Anesthesia: LOCAL | Site: Shoulder | Laterality: Left

## 2016-01-02 SURGERY — MUSCLE BIOPSY
Anesthesia: Choice | Laterality: Left

## 2016-01-02 MED ORDER — SODIUM BICARBONATE 4 % IV SOLN
INTRAVENOUS | Status: DC | PRN
  Administered 2016-01-02: 11 mL via INTRAMUSCULAR

## 2016-01-02 SURGICAL SUPPLY — 45 items
ADH SKN CLS APL DERMABOND .7 (GAUZE/BANDAGES/DRESSINGS)
APL SKNCLS STERI-STRIP NONHPOA (GAUZE/BANDAGES/DRESSINGS) ×2
BENZOIN TINCTURE PRP APPL 2/3 (GAUZE/BANDAGES/DRESSINGS) ×3 IMPLANT
BLADE CLIPPER SURG (BLADE) IMPLANT
BLADE SURG 10 STRL SS (BLADE) ×3 IMPLANT
BLADE SURG 15 STRL LF DISP TIS (BLADE) IMPLANT
BLADE SURG 15 STRL SS (BLADE)
CANISTER SUCT 1200ML W/VALVE (MISCELLANEOUS) IMPLANT
CHLORAPREP W/TINT 26ML (MISCELLANEOUS) ×3 IMPLANT
CLEANER CAUTERY TIP 5X5 PAD (MISCELLANEOUS) ×2 IMPLANT
COVER BACK TABLE 60X90IN (DRAPES) ×3 IMPLANT
COVER MAYO STAND STRL (DRAPES) ×3 IMPLANT
DECANTER SPIKE VIAL GLASS SM (MISCELLANEOUS) IMPLANT
DERMABOND ADVANCED (GAUZE/BANDAGES/DRESSINGS)
DERMABOND ADVANCED .7 DNX12 (GAUZE/BANDAGES/DRESSINGS) ×1 IMPLANT
DRAPE LAPAROTOMY 100X72 PEDS (DRAPES) ×3 IMPLANT
DRSG TEGADERM 4X4.75 (GAUZE/BANDAGES/DRESSINGS) ×3 IMPLANT
ELECT REM PT RETURN 9FT ADLT (ELECTROSURGICAL) ×3
ELECTRODE REM PT RTRN 9FT ADLT (ELECTROSURGICAL) ×2 IMPLANT
GAUZE SPONGE 4X4 16PLY XRAY LF (GAUZE/BANDAGES/DRESSINGS) IMPLANT
GLOVE BIO SURGEON STRL SZ 6.5 (GLOVE) ×2 IMPLANT
GLOVE BIOGEL PI IND STRL 7.0 (GLOVE) ×2 IMPLANT
GLOVE BIOGEL PI IND STRL 8.5 (GLOVE) ×2 IMPLANT
GLOVE BIOGEL PI INDICATOR 7.0 (GLOVE) ×2
GLOVE BIOGEL PI INDICATOR 8.5 (GLOVE) ×1
GLOVE ECLIPSE 8.0 STRL XLNG CF (GLOVE) ×3 IMPLANT
GOWN STRL REUS W/ TWL LRG LVL3 (GOWN DISPOSABLE) ×4 IMPLANT
GOWN STRL REUS W/TWL LRG LVL3 (GOWN DISPOSABLE) ×6
NDL HYPO 25X1 1.5 SAFETY (NEEDLE) ×1 IMPLANT
NEEDLE HYPO 25X1 1.5 SAFETY (NEEDLE) ×3 IMPLANT
NS IRRIG 1000ML POUR BTL (IV SOLUTION) IMPLANT
PACK BASIN DAY SURGERY FS (CUSTOM PROCEDURE TRAY) ×3 IMPLANT
PAD CLEANER CAUTERY TIP 5X5 (MISCELLANEOUS) ×1
PENCIL BUTTON HOLSTER BLD 10FT (ELECTRODE) ×3 IMPLANT
SPONGE GAUZE 4X4 12PLY STER LF (GAUZE/BANDAGES/DRESSINGS) IMPLANT
STRIP CLOSURE SKIN 1/2X4 (GAUZE/BANDAGES/DRESSINGS) ×3 IMPLANT
SUT MON AB 4-0 PC3 18 (SUTURE) ×2 IMPLANT
SUT PROLENE 2 0 CT2 30 (SUTURE) IMPLANT
SUT VIC AB 4-0 SH 18 (SUTURE) IMPLANT
SUT VIC AB 4-0 SH 27 (SUTURE)
SUT VIC AB 4-0 SH 27XANBCTRL (SUTURE) IMPLANT
SYR CONTROL 10ML LL (SYRINGE) ×3 IMPLANT
TOWEL OR 17X24 6PK STRL BLUE (TOWEL DISPOSABLE) ×6 IMPLANT
TUBE CONNECTING 20X1/4 (TUBING) IMPLANT
YANKAUER SUCT BULB TIP NO VENT (SUCTIONS) IMPLANT

## 2016-01-02 NOTE — Op Note (Signed)
Operative Note  Marisa Orozco female 65 y.o. 01/02/2016  PREOPERATIVE DX:  Dermatomyositis  POSTOPERATIVE DX:  Same  PROCEDURE:   Left deltoid muscle biopsy         Surgeon: Odis Hollingshead   Assistants: None  Anesthesia: Local anesthesia consisting of a mixture of Xylocaine, Marcaine, and sodium bicarbonate  Indications:   This is a 65 year old female with a history many years ago dermatomyositis. There is some concern that she may have the disease process again. She now presents for a left deltoid muscle biopsy.    Procedure Detail:  She was seen in the holding area and a left shoulder marked with my initials. She is brought to the operating room and placed supine on the operating table. The left shoulder were sterilely prepped and draped. A timeout was performed.  Local anesthetic was infiltrated superficially and deep at the site of a previous scar. I made an incision through the previous scar and carried this down to the subcutaneous tissue. A small incision was made in the deltoid fascia. I isolated a 2.5 cm x 1 cm segment of deltoid muscle. I clamped proximal and distal and then sharply excised this segment. This was sent fresh to pathology.  I then suture ligated the 2 cut ends of the muscle with 3-0 Vicryl suture. Hemostasis of the wound was adequate at this time. The fascia was approximated with running 2-0 Vicryl suture. The subcutaneous tissue was approximated with interrupted 3-0 Vicryl suture. The skin was closed with 4-0 Monocryl subcuticular stitch. Steri-Strips and a sterile dressing were applied.  She tolerated the procedure well without any apparent complications. She was taken the holding room in satisfactory condition.

## 2016-01-02 NOTE — H&P (Signed)
[name] 12/30/2015 2:26 PM Location: [facility] Patient #: [id number] DOB: 1950/11/01 Married / Language: Cleophus Molt / Race: White Female   History of Present Illness Odis Hollingshead MD; 12/30/2015 3:00 PM) The patient is a 65 year old female.  Note:She is referred by Dr. Estanislado Pandy for muscle biopsy. She has a history of dermatomyositis in the past. She was placed on some prednisone recently and has had some side effects to that. She's had persistent aching in the left deltoid muscle as well as low-grade fevers. Although her CKs have not been markedly elevated, there is concern about dermatomyositis.  Other Problems Marjean Donna, CMA; 12/30/2015 2:26 PM) Anxiety Disorder Gastroesophageal Reflux Disease Melanoma  Past Surgical History Marjean Donna, CMA; 12/30/2015 2:26 PM) Breast Augmentation Bilateral. Colon Polyp Removal - Colonoscopy Foot Surgery Right. Oral Surgery Tonsillectomy  Diagnostic Studies History Marjean Donna, CMA; 12/30/2015 2:26 PM) Colonoscopy 1-5 years ago Mammogram within last year Pap Smear 1-5 years ago  Allergies Marjean Donna, CMA; 12/30/2015 2:27 PM) Codeine Phosphate *ANALGESICS - OPIOID* MetroNIDAZOLE *ANTI-INFECTIVE AGENTS - MISC.* NexIUM *ULCER DRUGS* Sulfa Antibiotics  Medication History (Sonya Bynum, CMA; 12/30/2015 2:28 PM) Plaquenil ('200MG'$  Tablet, Oral) Active. Vitamin D (Cholecalciferol) (1000UNIT Capsule, Oral) Active. Medications Reconciled  Social History Marjean Donna, CMA; 12/30/2015 2:26 PM) Alcohol use Occasional alcohol use. Caffeine use Tea. No drug use Tobacco use Former smoker.  Family History Marjean Donna, [place]; 12/30/2015 2:26 PM) Alcohol Abuse Brother. Anesthetic complications Mother. Arthritis Family Members In General. Depression Father, Mother. Diabetes Mellitus Family Members In General, Father. Heart Disease Mother. Kidney Disease Sister. Respiratory Condition  Mother. Seizure disorder Daughter. Thyroid problems Mother.  Pregnancy / Birth History Marjean Donna, [place]; 12/30/2015 2:26 PM) Age at menarche 80 years. Age of menopause 25-55 Gravida 2 Maternal age 63-20 Para 2    Review of Systems ([place]; 12/30/2015 2:26 PM) General Present- Fever. Not Present- Appetite Loss, Chills, Fatigue, Night Sweats, Weight Gain and Weight Loss. Skin Present- Rash. Not Present- Change in Wart/Mole, Dryness, Hives, Jaundice, New Lesions, Non-Healing Wounds and Ulcer. HEENT Present- Sore Throat and Wears glasses/contact lenses. Not Present- Earache, Hearing Loss, Hoarseness, Nose Bleed, Oral Ulcers, Ringing in the Ears, Seasonal Allergies, Sinus Pain, Visual Disturbances and Yellow Eyes. Respiratory Not Present- Bloody sputum, Chronic Cough, Difficulty Breathing, Snoring and Wheezing. Breast Not Present- Breast Mass, Breast Pain, Nipple Discharge and Skin Changes. Cardiovascular Present- Rapid Heart Rate and Swelling of Extremities. Not Present- Chest Pain, Difficulty Breathing Lying Down, Leg Cramps, Palpitations and Shortness of Breath. Gastrointestinal Not Present- Abdominal Pain, Bloating, Bloody Stool, Change in Bowel Habits, Chronic diarrhea, Constipation, Difficulty Swallowing, Excessive gas, Gets full quickly at meals, Hemorrhoids, Indigestion, Nausea, Rectal Pain and Vomiting. Female Genitourinary Not Present- Frequency, Nocturia, Painful Urination, Pelvic Pain and Urgency. Musculoskeletal Present- Joint Stiffness, Muscle Pain, Muscle Weakness and Swelling of Extremities. Not Present- Back Pain and Joint Pain. Neurological Not Present- Decreased Memory, Fainting, Headaches, Numbness, Seizures, Tingling, Tremor, Trouble walking and Weakness. Psychiatric Not Present- Anxiety, Bipolar, Change in Sleep Pattern, Depression, Fearful and Frequent crying. Endocrine Not Present- Cold Intolerance, Excessive Hunger, Hair Changes, Heat Intolerance, Hot  flashes and New Diabetes. Hematology Not Present- Blood Thinners, Easy Bruising, Excessive bleeding, Gland problems, HIV and Persistent Infections.  Vitals (Sonya Bynum CMA; 12/30/2015 2:26 PM) 12/30/2015 2:26 PM Weight: 169 lb Height: 68in Body Surface Area: 1.9 m Body Mass Index: 25.7 kg/m  Pulse: 76 (Regular)  BP: 124/80 (Sitting, Left Arm, Standard)  Physical Exam Odis Hollingshead MD; 12/30/2015 3:01 PM) The physical exam findings are as follows: Note:General: WDWN in NAD. Pleasant and cooperative.  HEENT: [place]/AT, no external nasal or ear masses, mucous membranes are moist  EYES: no scleral icterus  MUSCULOSKELETAL: Left shoulder scar  NEUROLOGIC: Alert and oriented, answers questions appropriately.  PSYCHIATRIC: Normal mood, affect , and behavior.    Assessment & Plan Odis Hollingshead MD; 12/30/2015 2:59 PM) DERMATOMYOSITIS (M33.90) Impression: She has a history of this in the past and there is concern that she has it again. She has discomfort in her left deltoid muscle which is where her previous biopsy was done at [facility] many years ago.  Plan: Left deltoid muscle biopsy. She would prefer local anesthesia. We discussed the risks which include but are not limited to bleeding, infection, wound healing problems. She seems to understand this and would like to proceed.  [name], M.D.

## 2016-01-02 NOTE — Interval H&P Note (Signed)
History and Physical Interval Note:  01/02/2016 2:55 PM  Marisa Orozco  has presented today for surgery, with the diagnosis of myositis  The various methods of treatment have been discussed with the patient and family. After consideration of risks, benefits and other options for treatment, the patient has consented to  Procedure(s) with comments: Ola (Left) - left deltoid muscle bx as a surgical intervention .  The patient's history has been reviewed, patient examined, no change in status, stable for surgery.  I have reviewed the patient's chart and labs.  Questions were answered to the patient's satisfaction.     Marisa Orozco

## 2016-01-02 NOTE — Discharge Instructions (Signed)
Apply ice packs to the area as much as possible the next 3-4 days.  He may shower beginning tomorrow. Remove the bandage 3 days after the surgery. Leave the Steri-Strips on and let them fall off by themselves.  Take Tylenol or Advil for pain. If this does not seem to be enough, please call the office tomorrow.  Avoid any heavy activity with the left arm until you are pain-free. Try to minimize lifting the left arm above her head.  Call for any wound problems including signs of infection or heavy bleeding.

## 2016-01-03 ENCOUNTER — Encounter (HOSPITAL_BASED_OUTPATIENT_CLINIC_OR_DEPARTMENT_OTHER): Payer: Self-pay | Admitting: General Surgery

## 2016-01-06 ENCOUNTER — Other Ambulatory Visit: Payer: Self-pay | Admitting: Rheumatology

## 2016-01-06 DIAGNOSIS — M359 Systemic involvement of connective tissue, unspecified: Secondary | ICD-10-CM

## 2016-01-06 DIAGNOSIS — R52 Pain, unspecified: Secondary | ICD-10-CM

## 2016-01-08 DIAGNOSIS — Z09 Encounter for follow-up examination after completed treatment for conditions other than malignant neoplasm: Secondary | ICD-10-CM | POA: Diagnosis not present

## 2016-01-08 DIAGNOSIS — Z049 Encounter for examination and observation for unspecified reason: Secondary | ICD-10-CM | POA: Diagnosis not present

## 2016-01-08 DIAGNOSIS — M3509 Sicca syndrome with other organ involvement: Secondary | ICD-10-CM | POA: Diagnosis not present

## 2016-01-08 DIAGNOSIS — H2513 Age-related nuclear cataract, bilateral: Secondary | ICD-10-CM | POA: Diagnosis not present

## 2016-01-08 DIAGNOSIS — Z79899 Other long term (current) drug therapy: Secondary | ICD-10-CM | POA: Diagnosis not present

## 2016-01-08 DIAGNOSIS — H3581 Retinal edema: Secondary | ICD-10-CM | POA: Diagnosis not present

## 2016-01-13 ENCOUNTER — Encounter (HOSPITAL_COMMUNITY): Payer: Self-pay

## 2016-01-15 ENCOUNTER — Ambulatory Visit (INDEPENDENT_AMBULATORY_CARE_PROVIDER_SITE_OTHER): Payer: Medicare Other | Admitting: Rheumatology

## 2016-01-15 DIAGNOSIS — Z79899 Other long term (current) drug therapy: Secondary | ICD-10-CM | POA: Diagnosis not present

## 2016-01-15 DIAGNOSIS — M3500 Sicca syndrome, unspecified: Secondary | ICD-10-CM | POA: Diagnosis not present

## 2016-01-15 DIAGNOSIS — Z113 Encounter for screening for infections with a predominantly sexual mode of transmission: Secondary | ICD-10-CM | POA: Diagnosis not present

## 2016-01-15 DIAGNOSIS — R21 Rash and other nonspecific skin eruption: Secondary | ICD-10-CM | POA: Diagnosis not present

## 2016-01-16 LAB — BASIC METABOLIC PANEL
BUN: 11 mg/dL (ref 4–21)
CREATININE: 0.6 mg/dL (ref 0.5–1.1)
GLUCOSE: 82 mg/dL
POTASSIUM: 4.2 mmol/L (ref 3.4–5.3)
Sodium: 137 mmol/L (ref 137–147)

## 2016-01-16 LAB — CBC AND DIFFERENTIAL
HCT: 40 % (ref 36–46)
Hemoglobin: 12.9 g/dL (ref 12.0–16.0)
Platelets: 375 10*3/uL (ref 150–399)
WBC: 13 10*3/mL

## 2016-01-16 LAB — HEPATIC FUNCTION PANEL
ALT: 35 U/L (ref 7–35)
AST: 38 U/L — AB (ref 13–35)
Alkaline Phosphatase: 52 U/L (ref 25–125)
Bilirubin, Total: 0.4 mg/dL

## 2016-01-21 ENCOUNTER — Telehealth: Payer: Self-pay | Admitting: Cardiology

## 2016-01-21 NOTE — Telephone Encounter (Signed)
New Message  Pt voiced someone from our office called [phone] when she asked for number to be removed and not for anyone to contact her via that number only the number listed on her profile  Pt voiced she was returning call but there are no encounters for this patients conversation.  Please f/u

## 2016-01-21 NOTE — Telephone Encounter (Signed)
The call pt received was a call from Dr Kennon Holter office for her husband.  They had left message and she doesn't need anything.

## 2016-01-24 ENCOUNTER — Other Ambulatory Visit: Payer: Self-pay | Admitting: Rheumatology

## 2016-01-24 DIAGNOSIS — M3313 Other dermatomyositis without myopathy: Secondary | ICD-10-CM

## 2016-01-30 ENCOUNTER — Ambulatory Visit (HOSPITAL_COMMUNITY)
Admission: RE | Admit: 2016-01-30 | Discharge: 2016-01-30 | Disposition: A | Discharge status: Home / Self Care | Payer: Medicare Other | Source: Ambulatory Visit | Attending: Rheumatology | Admitting: Rheumatology

## 2016-01-30 ENCOUNTER — Telehealth: Payer: Self-pay | Admitting: Radiology

## 2016-01-30 ENCOUNTER — Encounter (HOSPITAL_COMMUNITY): Payer: Self-pay

## 2016-01-30 DIAGNOSIS — R935 Abnormal findings on diagnostic imaging of other abdominal regions, including retroperitoneum: Secondary | ICD-10-CM | POA: Diagnosis not present

## 2016-01-30 DIAGNOSIS — M3313 Other dermatomyositis without myopathy: Secondary | ICD-10-CM | POA: Diagnosis not present

## 2016-01-30 DIAGNOSIS — M3390 Dermatopolymyositis, unspecified, organ involvement unspecified: Secondary | ICD-10-CM | POA: Diagnosis present

## 2016-01-30 DIAGNOSIS — N6310 Unspecified lump in the right breast, unspecified quadrant: Secondary | ICD-10-CM | POA: Insufficient documentation

## 2016-01-30 DIAGNOSIS — N6320 Unspecified lump in the left breast, unspecified quadrant: Secondary | ICD-10-CM | POA: Diagnosis not present

## 2016-01-30 DIAGNOSIS — R918 Other nonspecific abnormal finding of lung field: Secondary | ICD-10-CM | POA: Diagnosis not present

## 2016-01-30 DIAGNOSIS — M609 Myositis, unspecified: Secondary | ICD-10-CM | POA: Diagnosis not present

## 2016-01-30 DIAGNOSIS — E320 Persistent hyperplasia of thymus: Secondary | ICD-10-CM

## 2016-01-30 MED ORDER — IOPAMIDOL (ISOVUE-300) INJECTION 61%
100.0000 mL | Freq: Once | INTRAVENOUS | Status: AC | PRN
  Administered 2016-01-30: 100 mL via INTRAVENOUS

## 2016-01-30 NOTE — Telephone Encounter (Signed)
Patient notified, referral made.

## 2016-01-30 NOTE — Telephone Encounter (Signed)
-----   Message from Bo Merino, MD sent at 01/30/2016  1:03 PM EDT ----- Please, notify Pt and refer to endocrinology.

## 2016-01-31 NOTE — Progress Notes (Signed)
*IMAGE* Office Visit Note  Patient: Marisa Orozco             Date of Birth: Aug 16, 1950           MRN: [id number]             PCP: Gar Ponto, MD Referring: Caryl Bis, MD Visit Date: 02/05/2016    Subjective:  Generalized pain.  History of Present Illness: Marisa Orozco is a 65 y.o. female. She returns for follow-up visit today she started taking him prednisone 20 mg about 2 weeks ago. His been on Plaquenil for more than a month now. She states after taking prednisone she felt like a new person with decreased pain and decreased fatigue, and decreased myalgias. Her mobility is better. And rash has resolved. The plan is to keep her on prednisone 20 mg for a month for right now. She reports that her depression is better.  Activities of Daily Living:  Patient reports morning stiffness for 0 minutes.   Patient Denies nocturnal pain.  Difficulty dressing/grooming: Denies Difficulty climbing stairs: Denies Difficulty getting out of chair: Denies Difficulty using hands for taps, buttons, cutlery, and/or writing: Denies   Review of Systems  Constitutional: Positive for weight loss. Negative for fatigue, night sweats, weight gain and weakness.  HENT: Negative for mouth sores, trouble swallowing, trouble swallowing, mouth dryness and nose dryness.   Eyes: Negative for pain, redness, visual disturbance and dryness.  Respiratory: Negative for cough, shortness of breath and difficulty breathing.   Cardiovascular: Negative for chest pain, palpitations, hypertension, irregular heartbeat and swelling in legs/feet.  Gastrointestinal: Negative for blood in stool, constipation and diarrhea.  Endocrine: Negative for increased urination.  Genitourinary: Negative for vaginal dryness.  Musculoskeletal: Positive for arthralgias, joint pain and joint swelling. Negative for myalgias, muscle weakness, morning stiffness, muscle tenderness and myalgias.  Skin: Negative for color change, rash, hair  loss, skin tightness, ulcers and sensitivity to sunlight.  Allergic/Immunologic: Negative for susceptible to infections.  Neurological: Negative for dizziness, memory loss and night sweats.  Hematological: Negative for swollen glands.  Psychiatric/Behavioral: Negative for depressed mood and sleep disturbance. The patient is not nervous/anxious.     PMFS History:  Patient Active Problem List   Diagnosis Date Noted  . Sjogren's disease ([place]) 02/04/2016  . 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 Former smoker 02/04/2016  . 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 Fatigue 02/15/2012    Past Medical History:  Diagnosis Date  . Allergy   . Cancer ([place])    basal cell carcinoma - face  . 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 Former smoker 02/04/2016  . GERD (gastroesophageal reflux disease)    otc med prn  . PONV (postoperative nausea and vomiting)   . Sjogren's disease ([place]) 02/04/2016   Positive ANA, Positive Ro, Positive CCP, Negative RF, Parotitis, Positive Sicca  . Sjogren's syndrome ([place])    from MD note    Family History  Problem Relation Age of Onset  . COPD Mother   . Heart disease Mother   . Diabetes Father   . Kidney disease Father   . Colon cancer Neg Hx   . Esophageal cancer Neg Hx   . Stomach cancer Neg Hx   . Rectal cancer Neg Hx    Past Surgical History:  Procedure Laterality Date  . BREAST SURGERY     augmentation  . BUNIONECTOMY     right foot  . COLONOSCOPY    .  DIAGNOSTIC LAPAROSCOPY    . DILATION AND CURETTAGE OF UTERUS  04/15/2011   Procedure: DILATATION AND CURETTAGE;  Surgeon: [name];  Location: [facility];  Service: Gynecology;  Laterality: N/A;  . MUSCLE BIOPSY Left 01/02/2016   Procedure: MUSCLE BIOPSY LEFT DELTOID;  Surgeon: [name], MD;  Location: [facility];  Service: General;  Laterality: Left;  left deltoid muscle bx  . NASAL SEPTUM SURGERY    . SVD     x 2  . TONSILLECTOMY    .  WISDOM TOOTH EXTRACTION     Social History   Social History Narrative  . No narrative on file     Objective: Vital Signs: BP 134/61 (BP Location: Left Arm, Patient Position: Sitting, Cuff Size: Large)   Pulse 76   Resp 12   Ht '5\' 6"'$  (1.676 m)   Wt 155 lb (70.3 kg)   BMI 25.02 kg/m    Physical Exam  Constitutional: She is oriented to person, place, and time. She appears well-developed and well-nourished.  HENT:  Head: Normocephalic and atraumatic.  Eyes: Conjunctivae and EOM are normal.  Neck: Normal range of motion.  Cardiovascular: Normal rate, regular rhythm, normal heart sounds and intact distal pulses.   Pulmonary/Chest: Effort normal and breath sounds normal.  Abdominal: Soft. Bowel sounds are normal.  Lymphadenopathy:    She has no cervical adenopathy.  Neurological: She is alert and oriented to person, place, and time.  Skin: Skin is warm and dry. Capillary refill takes 2 to 3 seconds.  Psychiatric: She has a normal mood and affect. Her behavior is normal.     Musculoskeletal Exam: C-spine and thoracic lumbar spine with good range of motion, shoulder joints, , elbow joints, wrist joints, MCP joints, PIP/DIP joints all good range of motion. She has thickening of some PIP/DIP and CMC joints consistent with osteoarthritis. Hip joints knee joints ankles MTPs PIPs DIPs with good range of motion with no synovitis.  CDAI Exam: No CDAI exam completed.    Investigation: Findings:  01/15/2016 comprehensive metabolic panel AST 38 ALD 35 CBC WBC 13.0 CK 766 [place] negative September 2017 pANCAnegative c-ANCA 1:40 Jo 1 antibody positive sedimentation rate 12 CK 757 vitamin D 43 TB indeterminate 9/29/ 17 PPD negative,08/29/2017Hep negative, SPEP negative, TPMT negative    Imaging: Ct Chest W Contrast  Addendum Date: 01/30/2016   ADDENDUM REPORT: 01/30/2016 13:21 ADDENDUM: No contributory finding in the abdomen. Incidental findings described in the report. Electronically  Signed   By: Monte Fantasia M.D.   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 EXAM: CT CHEST, ABDOMEN, AND PELVIS WITH CONTRAST TECHNIQUE: Multidetector CT imaging of the chest, abdomen and pelvis was performed following the standard protocol during bolus administration of intravenous contrast. CONTRAST:  114m ISOVUE-300 IOPAMIDOL (ISOVUE-300) INJECTION 61% COMPARISON:  None. FINDINGS: CT CHEST FINDINGS Cardiovascular: Normal heart size.  No pericardial effusion. Mediastinum/Nodes: Thymus is prominent for age and there is hypodense nodular area on the right measuring 19 x 9 mm. No lymphadenopathy or pleural effusions/nodules. Lungs/Pleura: There is no edema, consolidation, effusion, or pneumothorax. Musculoskeletal: Bilateral calcified breast implants with intracapsular rupture. There are contiguous isodense masses along the medial right and inferior left capsules most consistent with extracapsular rupture. Exaggerated thoracic kyphosis. No acute or aggressive osseous finding. CT ABDOMEN PELVIS FINDINGS Hepatobiliary: No focal liver abnormality.No evidence of biliary obstruction or stone. Pancreas: Unremarkable. Spleen: Unremarkable. Adrenals/Urinary Tract: Negative adrenals. No hydronephrosis or stone. Unremarkable  bladder. Stomach/Bowel: No obstruction. No appendicitis. Diffuse colonic stool. Colonic diverticulosis. Vascular/Lymphatic: No acute vascular abnormality. No mass or adenopathy. Reproductive:No pathologic findings. Other: No ascites or pneumoperitoneum. Musculoskeletal: Degenerative changes with focally advanced degenerative disc narrowing at L5-S1. No acute or aggressive finding. IMPRESSION: 1. Prominent thymus for age with 19 x 9 mm mass or cyst. Given association of thymic neoplasm and dermatomyositis, recommend imaging surveillance at least. In the future, chest MRI with contrast may be useful in distinguishing solid from cystic mass. 2.  Bilateral breast masses most consistent with extracapsular implant rupture. Please ensure patient is up-to-date with mammography. Electronically Signed: By: Monte Fantasia M.D. On: 01/30/2016 12:43   Ct Abdomen Pelvis W Contrast  Addendum Date: 01/30/2016   ADDENDUM REPORT: 01/30/2016 13:21 ADDENDUM: No contributory finding in the abdomen. Incidental findings described in the report. Electronically Signed   By: Monte Fantasia M.D.   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 EXAM: CT CHEST, ABDOMEN, AND PELVIS WITH CONTRAST TECHNIQUE: Multidetector CT imaging of the chest, abdomen and pelvis was performed following the standard protocol during bolus administration of intravenous contrast. CONTRAST:  179m ISOVUE-300 IOPAMIDOL (ISOVUE-300) INJECTION 61% COMPARISON:  None. FINDINGS: CT CHEST FINDINGS Cardiovascular: Normal heart size.  No pericardial effusion. Mediastinum/Nodes: Thymus is prominent for age and there is hypodense nodular area on the right measuring 19 x 9 mm. No lymphadenopathy or pleural effusions/nodules. Lungs/Pleura: There is no edema, consolidation, effusion, or pneumothorax. Musculoskeletal: Bilateral calcified breast implants with intracapsular rupture. There are contiguous isodense masses along the medial right and inferior left capsules most consistent with extracapsular rupture. Exaggerated thoracic kyphosis. No acute or aggressive osseous finding. CT ABDOMEN PELVIS FINDINGS Hepatobiliary: No focal liver abnormality.No evidence of biliary obstruction or stone. Pancreas: Unremarkable. Spleen: Unremarkable. Adrenals/Urinary Tract: Negative adrenals. No hydronephrosis or stone. Unremarkable bladder. Stomach/Bowel: No obstruction. No appendicitis. Diffuse colonic stool. Colonic diverticulosis. Vascular/Lymphatic: No acute vascular abnormality. No mass or adenopathy. Reproductive:No pathologic findings. Other: No ascites or  pneumoperitoneum. Musculoskeletal: Degenerative changes with focally advanced degenerative disc narrowing at L5-S1. No acute or aggressive finding. IMPRESSION: 1. Prominent thymus for age with 19 x 9 mm mass or cyst. Given association of thymic neoplasm and dermatomyositis, recommend imaging surveillance at least. In the future, chest MRI with contrast may be useful in distinguishing solid from cystic mass. 2. Bilateral breast masses most consistent with extracapsular implant rupture. Please ensure patient is up-to-date with mammography. Electronically Signed: By: JMonte FantasiaM.D. On: 01/30/2016 12:43    Speciality Comments: No specialty comments available.    Procedures:  No procedures performed Allergies: Codeine; Flagyl [metronidazole hcl]; Nexium [esomeprazole magnesium]; and Sulfa drugs cross reactors   Assessment / Plan: Visit Diagnoses:  Dermatomyositis ([place] - +ANA,+Ro,+CCP,Jo1+,positive skinbx. She has been on prednisone 20 mg a day which has made tremendous improvement in her symptoms. She reports improvement in her fatigue, myalgias, arthralgias, rash and depression. She had good muscle strength on examination today,and had no difficulty getting up from chair.   High-risk prescription: She was started on Plaquenil in the beginning before we had elevation of her CKs reported. Prednisone was added after the skin biopsy and CK elevation report. I have discussed use of methotrexate or Imuran at length with her today and also during the previous visits. She would wait until she gets second opinion from a tertiary care center regarding these medications as she is concerned about their side effects. As she is on long-term prednisone I  will check her CBC comprehensive metabolic panel and CK today we will check these labs again in a month and then taper her prednisone by 2.5 mg every month until she reaches 10 mg if tolerated. When she reaches 10 mg I'll taper by 1 mg every month.  Thymoma:  It was noticed on the CT scan of the chest and she's been is scheduled to see an oncologist.   Sjogren's syndrome with other organ involvement ([place]) - parotitis resolved. She has mild sicca symptoms which are tolerable with over-the-counter medications.  Need for regular exercise was discussed.   Patient has been advised to get pneumococcal vaccine and bone density through her PCP.   Orders: Face-to-face time spent with patient was 35 minutes. 50% of time was spent in counseling and coordination of care.  Follow-Up Instructions: Return in about 2 months (around 04/06/2016) for Dermatomyositis.     Bo Merino, MD

## 2016-02-04 ENCOUNTER — Telehealth: Payer: Self-pay | Admitting: Radiology

## 2016-02-04 ENCOUNTER — Encounter: Payer: Self-pay | Admitting: *Deleted

## 2016-02-04 ENCOUNTER — Other Ambulatory Visit: Payer: Self-pay | Admitting: Rheumatology

## 2016-02-04 DIAGNOSIS — D150 Benign neoplasm of thymus: Secondary | ICD-10-CM

## 2016-02-04 DIAGNOSIS — Z87891 Personal history of nicotine dependence: Secondary | ICD-10-CM

## 2016-02-04 DIAGNOSIS — M3313 Other dermatomyositis without myopathy: Secondary | ICD-10-CM | POA: Insufficient documentation

## 2016-02-04 DIAGNOSIS — D4989 Neoplasm of unspecified behavior of other specified sites: Secondary | ICD-10-CM | POA: Insufficient documentation

## 2016-02-04 DIAGNOSIS — M3390 Dermatopolymyositis, unspecified, organ involvement unspecified: Secondary | ICD-10-CM

## 2016-02-04 DIAGNOSIS — E320 Persistent hyperplasia of thymus: Secondary | ICD-10-CM

## 2016-02-04 DIAGNOSIS — M3500 Sicca syndrome, unspecified: Secondary | ICD-10-CM

## 2016-02-04 HISTORY — DX: Personal history of nicotine dependence: Z87.891

## 2016-02-04 HISTORY — DX: Sjogren syndrome, unspecified: M35.00

## 2016-02-04 HISTORY — DX: Dermatopolymyositis, unspecified, organ involvement unspecified: M33.90

## 2016-02-04 HISTORY — DX: Other dermatomyositis without myopathy: M33.13

## 2016-02-04 NOTE — Telephone Encounter (Signed)
I have called patient to advise her the referral from Dr Cruzita Lederer was declined, Dr Letta Median would like her to see the oncologist instead. Patient has voiced understanding. I have placed the order for Oncology per Dr Estanislado Pandy

## 2016-02-05 ENCOUNTER — Ambulatory Visit (INDEPENDENT_AMBULATORY_CARE_PROVIDER_SITE_OTHER): Payer: Medicare Other | Admitting: Rheumatology

## 2016-02-05 ENCOUNTER — Encounter: Payer: Self-pay | Admitting: Rheumatology

## 2016-02-05 VITALS — BP 134/61 | HR 76 | Resp 12 | Ht 66.0 in | Wt 155.0 lb

## 2016-02-05 DIAGNOSIS — Z79899 Other long term (current) drug therapy: Secondary | ICD-10-CM | POA: Diagnosis not present

## 2016-02-05 DIAGNOSIS — M3390 Dermatopolymyositis, unspecified, organ involvement unspecified: Secondary | ICD-10-CM

## 2016-02-05 DIAGNOSIS — M3509 Sicca syndrome with other organ involvement: Secondary | ICD-10-CM | POA: Diagnosis not present

## 2016-02-05 DIAGNOSIS — D150 Benign neoplasm of thymus: Secondary | ICD-10-CM

## 2016-02-05 DIAGNOSIS — D4989 Neoplasm of unspecified behavior of other specified sites: Secondary | ICD-10-CM

## 2016-02-05 LAB — CBC WITH DIFFERENTIAL/PLATELET
BASOS ABS: 0 {cells}/uL (ref 0–200)
Basophils Relative: 0 %
EOS ABS: 133 {cells}/uL (ref 15–500)
EOS PCT: 1 %
HEMATOCRIT: 43.6 % (ref 35.0–45.0)
HEMOGLOBIN: 14.1 g/dL (ref 11.7–15.5)
LYMPHS ABS: 1596 {cells}/uL (ref 850–3900)
Lymphocytes Relative: 12 %
MCH: 28.5 pg (ref 27.0–33.0)
MCHC: 32.3 g/dL (ref 32.0–36.0)
MCV: 88.3 fL (ref 80.0–100.0)
MONO ABS: 266 {cells}/uL (ref 200–950)
MPV: 10.9 fL (ref 7.5–12.5)
Monocytes Relative: 2 %
NEUTROS ABS: 11305 {cells}/uL — AB (ref 1500–7800)
NEUTROS PCT: 85 %
Platelets: 284 10*3/uL (ref 140–400)
RBC: 4.94 MIL/uL (ref 3.80–5.10)
RDW: 15.6 % — ABNORMAL HIGH (ref 11.0–15.0)
WBC: 13.3 10*3/uL — ABNORMAL HIGH (ref 3.8–10.8)

## 2016-02-05 NOTE — Patient Instructions (Addendum)
Standing Labs We placed an order today for your standing lab work.    Please come back and get your standing labs in one month then every 2 months.  We have open lab Monday through Friday from 8:30-11:30 AM and 1-4 PM at the office of Dr. Tresa Moore, PA.   The office is located at [address] [place], [place], [place] [address] No appointment is necessary.   Labs are drawn by [facility].  You may receive a bill from [facility] for your lab work.

## 2016-02-06 LAB — COMPLETE METABOLIC PANEL WITH GFR
ALBUMIN: 3.8 g/dL (ref 3.6–5.1)
ALK PHOS: 44 U/L (ref 33–130)
ALT: 28 U/L (ref 6–29)
AST: 25 U/L (ref 10–35)
BILIRUBIN TOTAL: 0.6 mg/dL (ref 0.2–1.2)
BUN: 14 mg/dL (ref 7–25)
CALCIUM: 9.3 mg/dL (ref 8.6–10.4)
CO2: 22 mmol/L (ref 20–31)
Chloride: 105 mmol/L (ref 98–110)
Creat: 0.65 mg/dL (ref 0.50–0.99)
GFR, Est African American: >89 mL/min (ref >=60)
GLUCOSE: 121 mg/dL — AB (ref 65–99)
Potassium: 4.3 mmol/L (ref 3.5–5.3)
SODIUM: 139 mmol/L (ref 135–146)
TOTAL PROTEIN: 7.6 g/dL (ref 6.1–8.1)

## 2016-02-06 LAB — CK: CK TOTAL: 243 U/L — AB (ref 7–177)

## 2016-02-07 ENCOUNTER — Telehealth: Payer: Self-pay | Admitting: Rheumatology

## 2016-02-07 NOTE — Telephone Encounter (Signed)
Patient returned Dr. Arlean Hopping phone call. patient call [phone]

## 2016-02-10 DIAGNOSIS — Z23 Encounter for immunization: Secondary | ICD-10-CM | POA: Diagnosis not present

## 2016-02-18 ENCOUNTER — Ambulatory Visit (INDEPENDENT_AMBULATORY_CARE_PROVIDER_SITE_OTHER): Payer: Medicare Other | Admitting: Thoracic Surgery (Cardiothoracic Vascular Surgery)

## 2016-02-18 ENCOUNTER — Other Ambulatory Visit: Payer: Self-pay | Admitting: *Deleted

## 2016-02-18 VITALS — BP 140/72 | HR 77 | Ht 66.0 in | Wt 148.0 lb

## 2016-02-18 DIAGNOSIS — E328 Other diseases of thymus: Secondary | ICD-10-CM

## 2016-02-18 DIAGNOSIS — R5383 Other fatigue: Secondary | ICD-10-CM

## 2016-02-18 DIAGNOSIS — J9859 Other diseases of mediastinum, not elsewhere classified: Secondary | ICD-10-CM

## 2016-02-18 LAB — TSH: TSH: 0.87 mIU/L

## 2016-02-18 NOTE — Progress Notes (Signed)
PCP is Gar Ponto, MD Referring Provider is Caryl Bis, MD  Chief Complaint  Patient presents with  . Mass    Surgical eval on Prominent thymus with 19 x 9 mm mass or cyst seen on Chest/ABD/Pelvis CT 01/30/16    HPI: Marisa Orozco is a 65 year old woman with a past history of Sjogren's syndrome and dermatomyositis. She was first diagnosed with those at age 60. She was treated with prednisone for a while and then do not have any other issues for over 30 years. In August she began having headaches, swollen glands, and itching in her eyes. She also developed persistent aching pain in her left deltoid muscle. She had low-grade fevers but no chills or sweats. She lost 21 pounds over the past 3 months and has had a poor appetite. She was started on prednisone initially. A muscle biopsy of the left deltoid showed dermatomyositis. During her workup she had a CT chest which showed a prominent thymus with a 1 cm hypodense nodular area on the right side.  She lives at home with her husband and a handicapped daughter. Her daughter suffers from seizures. She would like to avoid surgery if possible.   Past Medical History:  Diagnosis Date  . Allergy   . Cancer ([place])    basal cell carcinoma - face  . 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 Former smoker 02/04/2016  . GERD (gastroesophageal reflux disease)    otc med prn  . PONV (postoperative nausea and vomiting)   . Sjogren's disease ([place]) 02/04/2016   Positive ANA, Positive Ro, Positive CCP, Negative RF, Parotitis, Positive Sicca  . Sjogren's syndrome ([place])    from MD note    Past Surgical History:  Procedure Laterality Date  . BREAST SURGERY     augmentation  . BUNIONECTOMY     right foot  . COLONOSCOPY    . DIAGNOSTIC LAPAROSCOPY    . DILATION AND CURETTAGE OF UTERUS  04/15/2011   Procedure: DILATATION AND CURETTAGE;  Surgeon: [name];  Location: [facility];  Service: Gynecology;   Laterality: N/A;  . MUSCLE BIOPSY Left 01/02/2016   Procedure: MUSCLE BIOPSY LEFT DELTOID;  Surgeon: [name], MD;  Location: [facility];  Service: General;  Laterality: Left;  left deltoid muscle bx  . NASAL SEPTUM SURGERY    . SVD     x 2  . TONSILLECTOMY    . WISDOM TOOTH EXTRACTION      Family History  Problem Relation Age of Onset  . COPD Mother   . Heart disease Mother   . Diabetes Father   . Kidney disease Father   . Colon cancer Neg Hx   . Esophageal cancer Neg Hx   . Stomach cancer Neg Hx   . Rectal cancer Neg Hx     Social History Social History  Substance Use Topics  . Smoking status: Former Smoker    Packs/day: 1.00    Years: 25.00    Types: Cigarettes    Quit date: 08/05/2003  . Smokeless tobacco: Never Used  . Alcohol use Yes     Comment: socially - wine    Current Outpatient Prescriptions  Medication Sig Dispense Refill  . Cholecalciferol (VITAMIN D3) 1000 UNIT/SPRAY LIQD Take 5,000 sprays by mouth daily.    . Flaxseed Oil OIL Take 15 mLs by mouth daily.    . hydroxychloroquine (PLAQUENIL) 200 MG tablet Take 100-200 mg  by mouth See admin instructions. Pt takes '200mg'$  in am, '100mg'$  in pm    . ibuprofen (ADVIL,MOTRIN) 200 MG tablet Take 200 mg by mouth every 6 (six) hours as needed for moderate pain.    . predniSONE (DELTASONE) 20 MG tablet Take 30 mg by mouth daily with breakfast.     . VITAMIN K PO Take 1 tablet by mouth daily.     No current facility-administered medications for this visit.     Allergies  Allergen Reactions  . Codeine Nausea And Vomiting  . Flagyl [Metronidazole Hcl] Other (See Comments)    Reaction muscle weakness  . Nexium [Esomeprazole Magnesium] Other (See Comments)    Reaction Muscle weakness  . Sulfa Drugs Cross Reactors Rash    Review of Systems  Constitutional: Positive for activity change, appetite change, fatigue, fever and unexpected weight change (Lost 21 pounds in last 3 months). Negative for  chills and diaphoresis.  HENT: Negative for trouble swallowing and voice change.   Eyes: Positive for itching.       Dry eyes  Respiratory: Negative for cough, shortness of breath and wheezing.   Cardiovascular: Negative for chest pain.  Gastrointestinal: Negative for abdominal pain and blood in stool.  Genitourinary: Positive for dysuria.  Musculoskeletal: Positive for arthralgias, joint swelling and myalgias.  Skin: Positive for rash.  Neurological: Negative for seizures and syncope.  Hematological: Negative for adenopathy. Does not bruise/bleed easily.  All other systems reviewed and are negative.   BP 140/72   Pulse 77   Ht '5\' 6"'$  (1.676 m)   Wt 148 lb (67.1 kg)   BMI 23.89 kg/m  Physical Exam  Constitutional: She is oriented to person, place, and time. She appears well-developed and well-nourished. No distress.  HENT:  Head: Normocephalic and atraumatic.  Eyes: Conjunctivae and EOM are normal. No scleral icterus.  Neck: Neck supple. No thyromegaly present.  Cardiovascular: Normal rate, regular rhythm, normal heart sounds and intact distal pulses.   No murmur heard. Pulmonary/Chest: Effort normal and breath sounds normal. No respiratory distress. She has no wheezes. She has no rales.  Abdominal: Soft. She exhibits no distension. There is no tenderness.  Musculoskeletal: She exhibits no edema.  Lymphadenopathy:    She has no cervical adenopathy.  Neurological: She is alert and oriented to person, place, and time. No cranial nerve deficit.  Skin: Skin is warm and dry.  Vitals reviewed.    Diagnostic Tests: CT CHEST, ABDOMEN, AND PELVIS WITH CONTRAST  TECHNIQUE: Multidetector CT imaging of the chest, abdomen and pelvis was performed following the standard protocol during bolus administration of intravenous contrast.  CONTRAST:  159m ISOVUE-300 IOPAMIDOL (ISOVUE-300) INJECTION 61%  COMPARISON:  None.  FINDINGS: CT CHEST FINDINGS  Cardiovascular: Normal  heart size.  No pericardial effusion.  Mediastinum/Nodes: Thymus is prominent for age and there is hypodense nodular area on the right measuring 19 x 9 mm. No lymphadenopathy or pleural effusions/nodules.  Lungs/Pleura: There is no edema, consolidation, effusion, or pneumothorax.  Musculoskeletal: Bilateral calcified breast implants with intracapsular rupture. There are contiguous isodense masses along the medial right and inferior left capsules most consistent with extracapsular rupture. Exaggerated thoracic kyphosis. No acute or aggressive osseous finding.  CT ABDOMEN PELVIS FINDINGS  Hepatobiliary: No focal liver abnormality.No evidence of biliary obstruction or stone.  Pancreas: Unremarkable.  Spleen: Unremarkable.  Adrenals/Urinary Tract: Negative adrenals. No hydronephrosis or stone. Unremarkable bladder.  Stomach/Bowel: No obstruction. No appendicitis. Diffuse colonic stool. Colonic diverticulosis.  Vascular/Lymphatic: No acute vascular abnormality. No  mass or adenopathy.  Reproductive:No pathologic findings.  Other: No ascites or pneumoperitoneum.  Musculoskeletal: Degenerative changes with focally advanced degenerative disc narrowing at L5-S1. No acute or aggressive finding.  IMPRESSION: 1. Prominent thymus for age with 19 x 9 mm mass or cyst. Given association of thymic neoplasm and dermatomyositis, recommend imaging surveillance at least. In the future, chest MRI with contrast may be useful in distinguishing solid from cystic mass. 2. Bilateral breast masses most consistent with extracapsular implant rupture. Please ensure patient is up-to-date with mammography.  Electronically Signed: By: Monte Fantasia M.D. On: 01/30/2016 12:43  I personally reviewed the CT chest and concur with the findings noted above.  Impression: 65 year old woman with Sjogren's syndrome and dermatomyositis who has a prominent thymus on CT. 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 There is a 1 cm area that is hypodense and the question is whether this is a mass or a simple cyst. I think a PET/CT could be helpful in differentiating. If that Is hypermetabolic and she would deftly neater thymus removed. If not we can probably follow this affects what she chooses. I did discuss with her the relationship of the thymus and thymoma is with autoimmune syndromes. This is most well-defined with myasthenia gravis area and neck case sometimes removing the thymus will send disease into remission or at least make it easier to treat. I cannot guarantee that would be the case with her autoimmune issues, but it is a possibility.  She would like to avoid surgery if at all possible. So we will go ahead and do a PET/CT first. I'm also going to check a TSH level as hyperthyroidism can cause thymic hyperplasia as well.  Plan: Check TSH PET/CT Return after PET/CT to discuss results  Melrose Nakayama, MD [facility] [phone]

## 2016-02-24 DIAGNOSIS — J9859 Other diseases of mediastinum, not elsewhere classified: Secondary | ICD-10-CM | POA: Diagnosis not present

## 2016-02-24 DIAGNOSIS — M3390 Dermatopolymyositis, unspecified, organ involvement unspecified: Secondary | ICD-10-CM | POA: Diagnosis not present

## 2016-02-26 ENCOUNTER — Encounter (HOSPITAL_COMMUNITY)
Admission: RE | Admit: 2016-02-26 | Discharge: 2016-02-26 | Disposition: A | Discharge status: Home / Self Care | Payer: Medicare Other | Source: Ambulatory Visit | Attending: Thoracic Surgery (Cardiothoracic Vascular Surgery) | Admitting: Thoracic Surgery (Cardiothoracic Vascular Surgery)

## 2016-02-26 DIAGNOSIS — J9859 Other diseases of mediastinum, not elsewhere classified: Secondary | ICD-10-CM | POA: Insufficient documentation

## 2016-02-26 DIAGNOSIS — R222 Localized swelling, mass and lump, trunk: Secondary | ICD-10-CM | POA: Diagnosis not present

## 2016-02-26 LAB — GLUCOSE, CAPILLARY: Glucose-Capillary: 93 mg/dL (ref 65–99)

## 2016-02-26 MED ORDER — FLUDEOXYGLUCOSE F - 18 (FDG) INJECTION: 7.3000 | Freq: Once | INTRAVENOUS | Status: DC | PRN

## 2016-03-03 ENCOUNTER — Encounter: Payer: Self-pay | Admitting: Thoracic Surgery (Cardiothoracic Vascular Surgery)

## 2016-03-03 ENCOUNTER — Other Ambulatory Visit: Payer: Self-pay | Admitting: *Deleted

## 2016-03-03 ENCOUNTER — Ambulatory Visit (INDEPENDENT_AMBULATORY_CARE_PROVIDER_SITE_OTHER): Payer: Medicare Other | Admitting: Thoracic Surgery (Cardiothoracic Vascular Surgery)

## 2016-03-03 ENCOUNTER — Telehealth: Payer: Self-pay | Admitting: Rheumatology

## 2016-03-03 VITALS — BP 166/87 | HR 108 | Resp 16 | Ht 66.0 in | Wt 148.0 lb

## 2016-03-03 DIAGNOSIS — D150 Benign neoplasm of thymus: Secondary | ICD-10-CM

## 2016-03-03 DIAGNOSIS — D4989 Neoplasm of unspecified behavior of other specified sites: Secondary | ICD-10-CM

## 2016-03-03 DIAGNOSIS — R5383 Other fatigue: Secondary | ICD-10-CM | POA: Diagnosis not present

## 2016-03-03 DIAGNOSIS — E328 Other diseases of thymus: Secondary | ICD-10-CM

## 2016-03-03 LAB — T4: T4, Total: 7.7 ug/dL (ref 4.5–12.0)

## 2016-03-03 NOTE — Progress Notes (Signed)
[address]       [place] [address]             [phone]       HPI: Mrs. Quizon returns today for follow-up after her PET/CT.  Mrs. 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 She was diagnosed with as age 65. She was treated with prednisone for years to control the symptoms, and then went over 30 years without any issues. In August she developed headaches, swollen glands, and itching in her eyes she also developed a persistent aching pain in her left deltoid muscle. A muscle biopsy showed dermatomyositis. This part of her workup she had a CT of the chest which showed a prominent thymus.   I saw her in the office on 02/18/2016. I recommended thymectomy, but she was reluctant to consider surgery. I did order a TSH level to make sure that she was not hyperthyroid. I also ordered a PET/CT to see if there was any evidence of hypermetabolic activity in the thymus gland.  In the interim since her last visit she saw 2 rheumatologists at [facility], both of whom recommended thymectomy.   Past Medical History:  Diagnosis Date  . Allergy   . Cancer ([place])    basal cell carcinoma - face  . Dermatomyositis ([place]) 02/04/2016   Jo 1 Positive  Skin Bx- Subtle interface Dermatitis   . Former smoker 02/04/2016  . GERD (gastroesophageal reflux disease)    otc med prn  . PONV (postoperative nausea and vomiting)   . Sjogren's disease ([place]) 02/04/2016   Positive ANA, Positive Ro, Positive CCP, Negative RF, Parotitis, Positive Sicca  . Sjogren's syndrome ([place])    from MD note   Past Surgical History:  Procedure Laterality Date  . BREAST SURGERY     augmentation  . BUNIONECTOMY     right foot  . COLONOSCOPY    . DIAGNOSTIC LAPAROSCOPY    . DILATION AND CURETTAGE OF UTERUS  04/15/2011   Procedure: DILATATION AND CURETTAGE;  Surgeon: [name];  Location: [facility];  Service: Gynecology;  Laterality: N/A;  . MUSCLE BIOPSY Left 01/02/2016   Procedure:  MUSCLE BIOPSY LEFT DELTOID;  Surgeon: [name], MD;  Location: [place];  Service: General;  Laterality: Left;  left deltoid muscle bx  . NASAL SEPTUM SURGERY    . SVD     x 2  . TONSILLECTOMY    . WISDOM TOOTH EXTRACTION       Current Outpatient Prescriptions  Medication Sig Dispense Refill  . Cholecalciferol (VITAMIN D3) 1000 UNIT/SPRAY LIQD Take 5,000 sprays by mouth daily.    . Flaxseed Oil OIL Take 15 mLs by mouth daily.    . hydroxychloroquine (PLAQUENIL) 200 MG tablet Take 100-200 mg by mouth See admin instructions. Pt takes '200mg'$  in am, '100mg'$  in pm    . ibuprofen (ADVIL,MOTRIN) 200 MG tablet Take 200 mg by mouth every 6 (six) hours as needed for moderate pain.    . predniSONE (DELTASONE) 20 MG tablet Take 30 mg by mouth daily with breakfast.     . VITAMIN K PO Take 1 tablet by mouth daily.     No current facility-administered medications for this visit.     Physical Exam BP (!) 166/87 (BP Location: Left Arm, Patient Position: Sitting, Cuff Size: Large)   Pulse (!) 108   Resp 16   Ht '5\' 6"'$  (1.676 m)   Wt 148 lb (  67.1 kg)   SpO2 98% Comment: ON RA  BMI 23.66 kg/m  65 year old woman in no acute distress Very anxious Cardiac tachycardic and regular Lungs clear with equal vessels bilaterally No palpable cervical adenopathy or thyromegaly  Diagnostic Tests: NUCLEAR MEDICINE PET SKULL BASE TO THIGH  TECHNIQUE: 7.3 mCi F-18 FDG was injected intravenously. Full-ring PET imaging was performed from the skull base to thigh after the radiotracer. CT data was obtained and used for attenuation correction and anatomic localization.  FASTING BLOOD GLUCOSE:  Value: 93 mg/dl  COMPARISON:  CT scan from 01/30/2016  FINDINGS: NECK  No hypermetabolic lymph nodes in the neck.  CHEST  There is only very low-grade activity in the mediastinal mass. The upper portion of this structure has activity similar to background mediastinal activity. The  lower more nodular portion anterior to the ascending aorta currently measures 10 mm in thickness, with a maximum SUV of 4.3 (background mediastinal blood pool activity 3.6).  Bilateral breast implants with capsular calcification, intracapsular rupture, and medial extracapsular extension contents on the right side, without hypermetabolic activity.  ABDOMEN/PELVIS  No abnormal hypermetabolic activity within the liver, pancreas, adrenal glands, or spleen. No hypermetabolic lymph nodes in the abdomen or pelvis. Scattered sigmoid colon diverticula. Aortoiliac atherosclerotic vascular disease.  SKELETON  No focal hypermetabolic activity to suggest skeletal metastasis.  IMPRESSION: 1. There is only very low-grade activity in the mediastinal soft tissue density. Maximum SUV is 4.3 with a background mediastinal blood pool activity of 3.6. This is well below the expected range for thymic carcinoma. Occasionally a low-grade thymoma can half activity in this range, but thigh mark hyperplasia can as well. Accordingly I suggest surveillance imaging by CT to follow size, in 6 months time if sooner imaging is not warranted. 2. Bilateral breast implants with capsular calcification, intracapsular rupture, and some medial extracapsular extension on the right. 3.  Aortoiliac atherosclerotic vascular disease.   Electronically Signed   By: Van Clines M.D.   On: 02/26/2016 13:18  I personally reviewed the PET/CT and concur with the findings noted above.  Impression: 65 year old woman incidentally found to have an enlarged thymus gland on CT of the chest. There is some mild hypermetabolic activity associated with this on PET/CT. 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 I think thymic carcinoma is very unlikely.  The 2 rheumatologists with whom she met at [facility] both recommended thymectomy due to concern for underlying malignancy causing recurrence of her  dermatomyositis.  Of note her TSH was very low at 0.87. That was down from 4 back in June of this year. Hyperthyroidism certainly could mimic some of her symptoms such as dry itchy eyes, anxiety, and tachycardia. 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 I am going to check T3 and T4 levels on her.  I had a long discussion with Mrs. Caraveo and her husband area we reviewed the CT and PET CT images. We discussed surgical approaches including VATS versus partial sternotomy. In her case because there is a question of possible malignancy, I think that a partial sternotomy would be the preferred approach. Certainly would want to be sure that we removed the entire thymus gland, and I think that's more safely done through a midline approach.  I discussed with them the general nature of the procedure, the need for general anesthesia, the incision to be used, expected hospital stay, and the overall recovery. I reviewed the indications, risks, benefits, and alternatives.  They understand the risks include, but are not limited to death, MI, DVT, PE, bleeding, possible need for transfusion, infection, phrenic nerve injury resulting in paralysis of the diaphragm, as well as the possibility of other unforeseeable complications.  She accepts the risks and agrees to proceed  Plan: Check T3 and T4 levels  Tentatively plan thymectomy via partial upper sternotomy on 04/13/2016.  Melrose Nakayama, MD [facility] [phone]

## 2016-03-03 NOTE — Telephone Encounter (Signed)
Patient is scheduled for January appt with Dr. Estanislado Pandy and would like to know if she needs to be seen sooner since she is being seen by a thoracic surgeon and a doctor at [facility]. Please advise.

## 2016-03-04 ENCOUNTER — Telehealth: Payer: Self-pay | Admitting: Radiology

## 2016-03-04 DIAGNOSIS — M810 Age-related osteoporosis without current pathological fracture: Secondary | ICD-10-CM | POA: Diagnosis not present

## 2016-03-04 DIAGNOSIS — Z780 Asymptomatic menopausal state: Secondary | ICD-10-CM | POA: Diagnosis not present

## 2016-03-04 DIAGNOSIS — Z79899 Other long term (current) drug therapy: Secondary | ICD-10-CM | POA: Diagnosis not present

## 2016-03-04 DIAGNOSIS — M8588 Other specified disorders of bone density and structure, other site: Secondary | ICD-10-CM | POA: Diagnosis not present

## 2016-03-04 LAB — T3: T3, Total: 91 ng/dL (ref 76–181)

## 2016-03-04 NOTE — Telephone Encounter (Signed)
Is Marisa Orozco ok for her to return to clinic, or do you want to see her sooner ?

## 2016-03-04 NOTE — Telephone Encounter (Signed)
Okay to wait till January appointment

## 2016-03-04 NOTE — Telephone Encounter (Signed)
Patient wants you to call her about her upcoming surgery for thymus removal and Thyroid / Will you call her later this week? I told her will not be today, as Seth Bake is out sick and the clinic is a little chaotic   [phone]

## 2016-03-05 DIAGNOSIS — E8881 Metabolic syndrome: Secondary | ICD-10-CM | POA: Diagnosis not present

## 2016-03-05 DIAGNOSIS — R634 Abnormal weight loss: Secondary | ICD-10-CM | POA: Diagnosis not present

## 2016-03-05 DIAGNOSIS — Z6822 Body mass index (BMI) 22.0-22.9, adult: Secondary | ICD-10-CM | POA: Diagnosis not present

## 2016-03-05 DIAGNOSIS — M1712 Unilateral primary osteoarthritis, left knee: Secondary | ICD-10-CM | POA: Diagnosis not present

## 2016-03-05 DIAGNOSIS — R7301 Impaired fasting glucose: Secondary | ICD-10-CM | POA: Diagnosis not present

## 2016-03-05 DIAGNOSIS — E782 Mixed hyperlipidemia: Secondary | ICD-10-CM | POA: Diagnosis not present

## 2016-03-05 DIAGNOSIS — K7581 Nonalcoholic steatohepatitis (NASH): Secondary | ICD-10-CM | POA: Diagnosis not present

## 2016-03-05 NOTE — Telephone Encounter (Signed)
I called patient and discussed her current situation. She is uncertain if she wants to have the surgery now or wait for 6 months. I told her that she should discuss with her oncologist, if the risk of malignancy then she should have surgery now. If there is no risk of malignancy that she can wait.

## 2016-03-05 NOTE — Telephone Encounter (Signed)
Thank you :)

## 2016-03-06 ENCOUNTER — Other Ambulatory Visit: Payer: Self-pay | Admitting: Radiology

## 2016-03-06 DIAGNOSIS — M3390 Dermatopolymyositis, unspecified, organ involvement unspecified: Secondary | ICD-10-CM | POA: Diagnosis not present

## 2016-03-06 DIAGNOSIS — Z79899 Other long term (current) drug therapy: Secondary | ICD-10-CM

## 2016-03-06 LAB — CBC WITH DIFFERENTIAL/PLATELET
BASOS ABS: 0 {cells}/uL (ref 0–200)
Basophils Relative: 0 %
EOS ABS: 148 {cells}/uL (ref 15–500)
EOS PCT: 2 %
HCT: 45.9 % — ABNORMAL HIGH (ref 35.0–45.0)
Hemoglobin: 14.8 g/dL (ref 11.7–15.5)
LYMPHS PCT: 31 %
Lymphs Abs: 2294 cells/uL (ref 850–3900)
MCH: 28.9 pg (ref 27.0–33.0)
MCHC: 32.2 g/dL (ref 32.0–36.0)
MCV: 89.6 fL (ref 80.0–100.0)
MONOS PCT: 8 %
MPV: 10.8 fL (ref 7.5–12.5)
Monocytes Absolute: 592 cells/uL (ref 200–950)
NEUTROS ABS: 4366 {cells}/uL (ref 1500–7800)
NEUTROS PCT: 59 %
PLATELETS: 187 10*3/uL (ref 140–400)
RBC: 5.12 MIL/uL — ABNORMAL HIGH (ref 3.80–5.10)
RDW: 15.7 % — AB (ref 11.0–15.0)
WBC: 7.4 10*3/uL (ref 3.8–10.8)

## 2016-03-07 LAB — COMPLETE METABOLIC PANEL WITH GFR
ALT: 11 U/L (ref 6–29)
AST: 11 U/L (ref 10–35)
Albumin: 4 g/dL (ref 3.6–5.1)
Alkaline Phosphatase: 39 U/L (ref 33–130)
BUN: 19 mg/dL (ref 7–25)
CALCIUM: 9.3 mg/dL (ref 8.6–10.4)
CHLORIDE: 109 mmol/L (ref 98–110)
CO2: 25 mmol/L (ref 20–31)
CREATININE: 0.84 mg/dL (ref 0.50–0.99)
GFR, EST AFRICAN AMERICAN: 84 mL/min (ref >=60)
GFR, EST NON AFRICAN AMERICAN: 73 mL/min (ref >=60)
Glucose, Bld: 104 mg/dL — ABNORMAL HIGH (ref 65–99)
Potassium: 4.2 mmol/L (ref 3.5–5.3)
Sodium: 143 mmol/L (ref 135–146)
Total Bilirubin: 0.6 mg/dL (ref 0.2–1.2)
Total Protein: 6.7 g/dL (ref 6.1–8.1)

## 2016-03-07 LAB — CK: CK TOTAL: 25 U/L (ref 7–177)

## 2016-03-16 ENCOUNTER — Telehealth: Payer: Self-pay | Admitting: Rheumatology

## 2016-03-16 ENCOUNTER — Encounter: Payer: Self-pay | Admitting: Rheumatology

## 2016-03-16 ENCOUNTER — Other Ambulatory Visit: Payer: Self-pay | Admitting: Radiology

## 2016-03-16 MED ORDER — HYDROXYCHLOROQUINE SULFATE 200 MG PO TABS: 300.0000 mg | ORAL_TABLET | Freq: Every day | ORAL | 0 refills | Status: DC

## 2016-03-16 MED ORDER — FOLIC ACID 1 MG PO TABS: 1.0000 mg | ORAL_TABLET | Freq: Every day | ORAL | 3 refills | Status: DC

## 2016-03-16 NOTE — Telephone Encounter (Signed)
Patient is requesting refill of plaquenil be sent to [facility] on [address]. She only has enough to last until tomorrow.  Patient is also experiencing hair loss and is not sure which medication it might be associated with but she is requesting an rx for folic acid to see if that will help with the hair loss.

## 2016-03-16 NOTE — Telephone Encounter (Signed)
Amy L. Contacted pharmacy and verified prescription.

## 2016-03-16 NOTE — Telephone Encounter (Signed)
Can we discuss her hair loss when you get a chance ? She relates it to Prednisone, I told her will discuss with you on Tues

## 2016-03-16 NOTE — Telephone Encounter (Signed)
Please call [facility] about verifying an rx that they received for patient. Cb# [phone]

## 2016-03-17 DIAGNOSIS — J9859 Other diseases of mediastinum, not elsewhere classified: Secondary | ICD-10-CM | POA: Diagnosis not present

## 2016-03-19 ENCOUNTER — Other Ambulatory Visit: Payer: Self-pay | Admitting: *Deleted

## 2016-03-23 DIAGNOSIS — E8881 Metabolic syndrome: Secondary | ICD-10-CM | POA: Diagnosis not present

## 2016-03-23 DIAGNOSIS — Z6823 Body mass index (BMI) 23.0-23.9, adult: Secondary | ICD-10-CM | POA: Diagnosis not present

## 2016-03-27 DIAGNOSIS — Z85828 Personal history of other malignant neoplasm of skin: Secondary | ICD-10-CM | POA: Diagnosis not present

## 2016-03-27 DIAGNOSIS — M3390 Dermatopolymyositis, unspecified, organ involvement unspecified: Secondary | ICD-10-CM | POA: Diagnosis not present

## 2016-03-27 NOTE — Progress Notes (Signed)
Office Visit Note  Patient: Marisa Orozco             Date of Birth: 24-Jul-1950           MRN: [id number]             PCP: Gar Ponto, MD Referring: Caryl Bis, MD Visit Date: 04/07/2016 Occupation: Retired Customer service manager    Subjective:  Hair loss   History of Present Illness: Marisa Orozco is a 65 y.o. female with history of dermatomyositis. She states she's been doing quite well. She denies any fatigue or muscle weakness. She has noticed some hair loss since the last visit. She seen a dermatologist in the meantime who felt that the hair loss is related to dermatomyositis and gave her topical steroid cream. She's also seen rheumatologist at [facility] Dr. Petra Kuba who was in agreement with the current treatment and recommended use of either methotrexate or Imuran. She's also seen a Psychologist, sport and exercise at [facility] who recommended repeat PET scan in 6 months. Based on that she may have robotic thymectomy. She has been tapering her prednisone and is down to 17.5 mg a day. Her rash is quite well controlled with Plaquenil and prednisone combination.  Activities of Daily Living:  Patient reports morning stiffness for0 minutes.   Patient Denies nocturnal pain.  Difficulty dressing/grooming: Denies Difficulty climbing stairs: Denies Difficulty getting out of chair: Denies Difficulty using hands for taps, buttons, cutlery, and/or writing: Denies   Review of Systems  Constitutional: Negative for fatigue, night sweats, weight gain, weight loss and weakness.  HENT: Negative for mouth sores, trouble swallowing, trouble swallowing, mouth dryness and nose dryness.   Eyes: Negative for pain, redness, visual disturbance and dryness.  Respiratory: Negative for cough, shortness of breath and difficulty breathing.   Cardiovascular: Negative for chest pain, palpitations, hypertension, irregular heartbeat and swelling in legs/feet.  Gastrointestinal: Negative for blood in stool, constipation and diarrhea.  Endocrine:  Negative for increased urination.  Genitourinary: Negative for vaginal dryness.  Musculoskeletal: Negative for arthralgias, joint pain, joint swelling, myalgias, muscle weakness, morning stiffness, muscle tenderness and myalgias.  Skin: Positive for hair loss. Negative for color change, rash, skin tightness, ulcers and sensitivity to sunlight.  Allergic/Immunologic: Negative for susceptible to infections.  Neurological: Negative for dizziness, memory loss and night sweats.  Hematological: Negative for swollen glands.  Psychiatric/Behavioral: Negative for depressed mood and sleep disturbance. The patient is not nervous/anxious.     PMFS History:  Patient Active Problem List   Diagnosis Date Noted  . Sjogren's disease ([place]) 02/04/2016  . Dermatomyositis ([place]) 02/04/2016  . Former smoker 02/04/2016  . 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 Fatigue 02/15/2012    Past Medical History:  Diagnosis Date  . Allergy   . Cancer ([place])    basal cell carcinoma - face  . Dermatomyositis ([place]) 02/04/2016   [name] 1 Positive  Skin Bx- Subtle interface Dermatitis   . Former smoker 02/04/2016  . GERD (gastroesophageal reflux disease)    otc med prn  . PONV (postoperative nausea and vomiting)   . Sjogren's disease ([place]) 02/04/2016   Positive ANA, Positive Ro, Positive CCP, Negative RF, Parotitis, Positive Sicca  . Sjogren's syndrome ([place])    from MD note    Family History  Problem Relation Age of Onset  . COPD Mother   . Heart disease Mother   . Diabetes Father   . Kidney disease Father   . Colon cancer Neg Hx   . Esophageal cancer Neg Hx   .  Stomach cancer Neg Hx   . Rectal cancer Neg Hx    Past Surgical History:  Procedure Laterality Date  . BREAST SURGERY     augmentation  . BUNIONECTOMY     right foot  . COLONOSCOPY    . DIAGNOSTIC LAPAROSCOPY    . DILATION AND CURETTAGE OF UTERUS  04/15/2011   Procedure: DILATATION AND CURETTAGE;  Surgeon: [name];  Location: [facility];  Service: Gynecology;   Laterality: N/A;  . MUSCLE BIOPSY Left 01/02/2016   Procedure: MUSCLE BIOPSY LEFT DELTOID;  Surgeon: [name], MD;  Location: [place];  Service: General;  Laterality: Left;  left deltoid muscle bx  . NASAL SEPTUM SURGERY    . SVD     x 2  . TONSILLECTOMY    . WISDOM TOOTH EXTRACTION     Social History   Social History Narrative  . No narrative on file     Objective: Vital Signs: BP (!) 153/66 (BP Location: Left Arm, Patient Position: Sitting, Cuff Size: Normal)   Pulse 85   Resp 12   Ht 5' 6.5" (1.689 m)   Wt 158 lb (71.7 kg)   BMI 25.12 kg/m    Physical Exam  Constitutional: She is oriented to person, place, and time. She appears well-developed and well-nourished.  HENT:  Head: Normocephalic and atraumatic.  Eyes: Conjunctivae and EOM are normal.  Neck: Normal range of motion.  Cardiovascular: Normal rate, regular rhythm, normal heart sounds and intact distal pulses.   Pulmonary/Chest: Effort normal and breath sounds normal.  Abdominal: Soft. Bowel sounds are normal.  Lymphadenopathy:    She has no cervical adenopathy.  Neurological: She is alert and oriented to person, place, and time.  Skin: Skin is warm and dry. Capillary refill takes less than 2 seconds.  Thinning of hair  Psychiatric: She has a normal mood and affect. Her behavior is normal.  Nursing note and vitals reviewed.    Musculoskeletal Exam: C-spine, thoracic, lumbar spine good range of motion.shoulder joints, elbow joints, wrist joints, MCPs PIPs DIPs with good range of motion with no synovitis. Hip joints, knee joints, ankles, MTPs PIPs with good range of motion with no synovitis. She had no muscle weakness on examination today. No rash was noted.  CDAI Exam: No CDAI exam completed.    Investigation: Findings:  Her labs from December 03, 2015:  CK was 97, ANA was negative, compliments were normal, double-strand DNA was negative, her Ro antibody was positive, La was negative,  beta-2 anticardiolipin lupus anticoagulant was negative, hepatitis panel was normal, G6PD was normal, immunoglobin IgM was low at 44, SPEP was normal, TPMT was normal, and TB test was indeterminate.  12/29/2015, per patient TSH was normal, her white cell count was up at 14.2, which reflects prednisone usage and comprehensive metabolic panel was normal except for a slight elevation in her blood glucose. Her highest CK 01/15/2016 was 766, HIV negative 04/03/2016 CBC normal, CMP normal, CK 18  Lab on 04/03/2016  Component Date Value Ref Range Status  . WBC 04/03/2016 7.7  3.8 - 10.8 K/uL Final  . RBC 04/03/2016 5.08  3.80 - 5.10 MIL/uL Final  . Hemoglobin 04/03/2016 15.2  11.7 - 15.5 g/dL Final  . HCT 04/03/2016 46.3* 35.0 - 45.0 % Final  . MCV 04/03/2016 91.1  80.0 - 100.0 fL Final  . MCH 04/03/2016 29.9  27.0 - 33.0 pg Final  . MCHC 04/03/2016 32.8  32.0 - 36.0 g/dL Final  .  RDW 04/03/2016 14.6  11.0 - 15.0 % Final  . Platelets 04/03/2016 190  140 - 400 K/uL Final  . MPV 04/03/2016 10.6  7.5 - 12.5 fL Final  . Neutro Abs 04/03/2016 4620  1,500 - 7,800 cells/uL Final  . Lymphs Abs 04/03/2016 2233  850 - 3,900 cells/uL Final  . Monocytes Absolute 04/03/2016 616  200 - 950 cells/uL Final  . Eosinophils Absolute 04/03/2016 231  15 - 500 cells/uL Final  . Basophils Absolute 04/03/2016 0  0 - 200 cells/uL Final  . Neutrophils Relative % 04/03/2016 60  % Final  . Lymphocytes Relative 04/03/2016 29  % Final  . Monocytes Relative 04/03/2016 8  % Final  . Eosinophils Relative 04/03/2016 3  % Final  . Basophils Relative 04/03/2016 0  % Final  . Smear Review 04/03/2016 Criteria for review not met   Final  . Sodium 04/03/2016 145  135 - 146 mmol/L Final  . Potassium 04/03/2016 3.8  3.5 - 5.3 mmol/L Final  . Chloride 04/03/2016 108  98 - 110 mmol/L Final  . CO2 04/03/2016 25  20 - 31 mmol/L Final  . Glucose, Bld 04/03/2016 95  65 - 99 mg/dL Final  . BUN 04/03/2016 20  7 - 25 mg/dL Final  . Creat  04/03/2016 0.70  0.50 - 0.99 mg/dL Final   Comment:   For patients > or = 65 years of age: The upper reference limit for Creatinine is approximately 13% higher for people identified as African-American.     . Total Bilirubin 04/03/2016 0.3  0.2 - 1.2 mg/dL Final  . Alkaline Phosphatase 04/03/2016 40  33 - 130 U/L Final  . AST 04/03/2016 12  10 - 35 U/L Final  . ALT 04/03/2016 12  6 - 29 U/L Final  . Total Protein 04/03/2016 6.4  6.1 - 8.1 g/dL Final  . Albumin 04/03/2016 3.8  3.6 - 5.1 g/dL Final  . Calcium 04/03/2016 9.3  8.6 - 10.4 mg/dL Final  . GFR, Est African American 04/03/2016 >89  >=60 mL/min Final  . GFR, Est Non African American 04/03/2016 >89  >=60 mL/min Final  Orders Only on 04/03/2016  Component Date Value Ref Range Status  . Total CK 04/04/2016 18  7 - 177 U/L Final  Orders Only on 03/06/2016  Component Date Value Ref Range Status  . WBC 03/06/2016 7.4  3.8 - 10.8 K/uL Final  . RBC 03/06/2016 5.12* 3.80 - 5.10 MIL/uL Final  . Hemoglobin 03/06/2016 14.8  11.7 - 15.5 g/dL Final  . HCT 03/06/2016 45.9* 35.0 - 45.0 % Final  . MCV 03/06/2016 89.6  80.0 - 100.0 fL Final  . MCH 03/06/2016 28.9  27.0 - 33.0 pg Final  . MCHC 03/06/2016 32.2  32.0 - 36.0 g/dL Final  . RDW 03/06/2016 15.7* 11.0 - 15.0 % Final  . Platelets 03/06/2016 187  140 - 400 K/uL Final  . MPV 03/06/2016 10.8  7.5 - 12.5 fL Final  . Neutro Abs 03/06/2016 4366  1,500 - 7,800 cells/uL Final  . Lymphs Abs 03/06/2016 2294  850 - 3,900 cells/uL Final  . Monocytes Absolute 03/06/2016 592  200 - 950 cells/uL Final  . Eosinophils Absolute 03/06/2016 148  15 - 500 cells/uL Final  . Basophils Absolute 03/06/2016 0  0 - 200 cells/uL Final  . Neutrophils Relative % 03/06/2016 59  % Final  . Lymphocytes Relative 03/06/2016 31  % Final  . Monocytes Relative 03/06/2016 8  % Final  . Eosinophils  Relative 03/06/2016 2  % Final  . Basophils Relative 03/06/2016 0  % Final  . Smear Review 03/06/2016 Criteria for review  not met   Final  . Total CK 03/07/2016 25  7 - 177 U/L Final  . Sodium 03/07/2016 143  135 - 146 mmol/L Final  . Potassium 03/07/2016 4.2  3.5 - 5.3 mmol/L Final  . Chloride 03/07/2016 109  98 - 110 mmol/L Final  . CO2 03/07/2016 25  20 - 31 mmol/L Final  . Glucose, Bld 03/07/2016 104* 65 - 99 mg/dL Final  . BUN 03/07/2016 19  7 - 25 mg/dL Final  . Creat 03/07/2016 0.84  0.50 - 0.99 mg/dL Final   Comment:   For patients > or = 65 years of age: The upper reference limit for Creatinine is approximately 13% higher for people identified as African-American.     . Total Bilirubin 03/07/2016 0.6  0.2 - 1.2 mg/dL Final  . Alkaline Phosphatase 03/07/2016 39  33 - 130 U/L Final  . AST 03/07/2016 11  10 - 35 U/L Final  . ALT 03/07/2016 11  6 - 29 U/L Final  . Total Protein 03/07/2016 6.7  6.1 - 8.1 g/dL Final  . Albumin 03/07/2016 4.0  3.6 - 5.1 g/dL Final  . Calcium 03/07/2016 9.3  8.6 - 10.4 mg/dL Final  . GFR, Est African American 03/07/2016 84  >=60 mL/min Final  . GFR, Est Non African American 03/07/2016 73  >=60 mL/min Final  Orders Only on 03/03/2016  Component Date Value Ref Range Status  . T3, Total 03/04/2016 91.0  76 - 181 ng/dL Final  . T4, Total 03/03/2016 7.7  4.5 - 12.0 ug/dL Final  Hospital Outpatient Visit on 02/26/2016  Component Date Value Ref Range Status  . Glucose-Capillary 02/26/2016 93  65 - 99 mg/dL Final  Orders Only on 02/18/2016  Component Date Value Ref Range Status  . TSH 02/18/2016 0.87  mIU/L Final   Comment:   Reference Range   > or = 20 Years  0.40-4.50   Pregnancy Range First trimester  0.26-2.66 Second trimester 0.55-2.73 Third trimester  0.43-2.91        Imaging: No results found.  Speciality Comments: No specialty comments available.    Procedures:  No procedures performed Allergies: Codeine; Flagyl [metronidazole hcl]; Nexium [esomeprazole magnesium]; and Sulfa drugs cross reactors   Assessment / Plan:     Visit Diagnoses:  Dermatomyositis ([place]) - Positive [name] 1, positive muscle biopsy, elevated CK, history of childhood dermatomyositis, c-ANCA 1:40. She is clinically doing very well. Her CKs within normal limits. She is a still on prednisone 17.5 mg by mouth daily and Plaquenil 200 mg in the morning and 100 mg at p.m. She is very pleased with her to present and is motivated to start on Imuran. Indications side effects contraindications were discussed at length with patient today by me and Dr. Koleen Nimrod of her pharmacist. Informed consent was obtained and a handout was given. She'll be starting on 50 mg by mouth daily for 2 weeks and then we will increase it 200 mg by mouth daily and then 150 mg by mouth daily. The labs will be checked every 2 weeks 3 and then every 2 months to monitor for drug toxicity. Prednisone taper was discussed as planned she'll be decreasing by 2.5 mg every month until she reaches 10 mg and then 1 mg every month. Prescription refill for prednisone 5 mg tablets were also given.  Sjogren's syndrome with keratoconjunctivitis  sicca ([facility]) - Positive ANA, positive Ro, positive CCP, history of parotitis, sicca symptoms. Symptoms are tolerable.  Other fatigue: It is much improved.  High risk medication use - Prednisone17.5 mg qd, Plaquenil 200 mg 1 by mouth every morning, half by mouth every afternoon  Former smoker - Colonoscopy 2013, mammogram 2017, Pap smear 2017  Thymoma : She is seeing a Psychologist, sport and exercise at [facility] now. And will have a repeat PET scan in 6 months.  Hair loss: According to patient the hair loss seems to be related to her dermatomyositis. She had hair loss when she was 16 and had dermatomyositis. She's using some topical steroids prescribed by her dermatologist.   Orders: No orders of the defined types were placed in this encounter.  Meds ordered this encounter  Medications  . predniSONE (DELTASONE) 5 MG tablet    Sig: Take 15 mg QD x 1 month, then 12.5 mg QD x 1 month, then take 10 mg QD x1  month    Dispense:  225 tablet    Refill:  0  . azaTHIOprine (IMURAN) 50 MG tablet    Sig: Take 50 mg QD for two weeks, then 100 mg QD for two weeks, then 150 mg QD.    Dispense:  222 tablet    Refill:  0    Face-to-face time spent with patient was 40 minutes. 50% of time was spent in counseling and coordination of care.  Follow-Up Instructions: Return in about 2 months (around 06/05/2016) for Dermatomyositis.   Bo Merino, MD

## 2016-04-01 ENCOUNTER — Telehealth: Payer: Self-pay | Admitting: Rheumatology

## 2016-04-01 ENCOUNTER — Encounter: Payer: Self-pay | Admitting: Rheumatology

## 2016-04-01 ENCOUNTER — Other Ambulatory Visit: Payer: Self-pay | Admitting: Radiology

## 2016-04-01 MED ORDER — PREDNISONE 10 MG PO TABS: 15.0000 mg | ORAL_TABLET | Freq: Every day | ORAL | 0 refills | Status: DC

## 2016-04-01 NOTE — Telephone Encounter (Signed)
Patient would like refill of prednisone be sent to [facility]. Patient states she does not have enough to last due to Dr. Estanislado Pandy increasing the dosage.

## 2016-04-01 NOTE — Telephone Encounter (Signed)
Resent prednisone to correct pharmacy, patient states [place], was previously sent to [facility] in error

## 2016-04-01 NOTE — Telephone Encounter (Signed)
Patient currently on '15mg'$  dose Has appt next week Dermatomyositis Ok to refill per Dr Estanislado Pandy

## 2016-04-03 ENCOUNTER — Other Ambulatory Visit: Payer: Self-pay | Admitting: Radiology

## 2016-04-03 ENCOUNTER — Telehealth: Payer: Self-pay | Admitting: Radiology

## 2016-04-03 ENCOUNTER — Other Ambulatory Visit: Payer: Self-pay | Admitting: *Deleted

## 2016-04-03 DIAGNOSIS — M3390 Dermatopolymyositis, unspecified, organ involvement unspecified: Secondary | ICD-10-CM | POA: Diagnosis not present

## 2016-04-03 DIAGNOSIS — Z79899 Other long term (current) drug therapy: Secondary | ICD-10-CM

## 2016-04-03 LAB — CBC WITH DIFFERENTIAL/PLATELET
Basophils Absolute: 0 {cells}/uL (ref 0–200)
Basophils Relative: 0 %
Eosinophils Absolute: 231 {cells}/uL (ref 15–500)
Eosinophils Relative: 3 %
HCT: 46.3 % — ABNORMAL HIGH (ref 35.0–45.0)
Hemoglobin: 15.2 g/dL (ref 11.7–15.5)
Lymphocytes Relative: 29 %
Lymphs Abs: 2233 {cells}/uL (ref 850–3900)
MCH: 29.9 pg (ref 27.0–33.0)
MCHC: 32.8 g/dL (ref 32.0–36.0)
MCV: 91.1 fL (ref 80.0–100.0)
MPV: 10.6 fL (ref 7.5–12.5)
Monocytes Absolute: 616 {cells}/uL (ref 200–950)
Monocytes Relative: 8 %
Neutro Abs: 4620 {cells}/uL (ref 1500–7800)
Neutrophils Relative %: 60 %
Platelets: 190 K/uL (ref 140–400)
RBC: 5.08 MIL/uL (ref 3.80–5.10)
RDW: 14.6 % (ref 11.0–15.0)
WBC: 7.7 K/uL (ref 3.8–10.8)

## 2016-04-03 LAB — COMPLETE METABOLIC PANEL WITH GFR
ALT: 12 U/L (ref 6–29)
AST: 12 U/L (ref 10–35)
Albumin: 3.8 g/dL (ref 3.6–5.1)
Alkaline Phosphatase: 40 U/L (ref 33–130)
BUN: 20 mg/dL (ref 7–25)
CHLORIDE: 108 mmol/L (ref 98–110)
CO2: 25 mmol/L (ref 20–31)
Calcium: 9.3 mg/dL (ref 8.6–10.4)
Creat: 0.7 mg/dL (ref 0.50–0.99)
Glucose, Bld: 95 mg/dL (ref 65–99)
Potassium: 3.8 mmol/L (ref 3.5–5.3)
Sodium: 145 mmol/L (ref 135–146)
Total Bilirubin: 0.3 mg/dL (ref 0.2–1.2)
Total Protein: 6.4 g/dL (ref 6.1–8.1)

## 2016-04-03 NOTE — Telephone Encounter (Signed)
Please clarify how often patient needs labs/ she came in today 4 weeks after the last labs, but looks like she may only need them every 8 weeks, I released the labs since she is tapering prednisone, but would like to let her know when the next set is due.

## 2016-04-04 LAB — CK: Total CK: 18 U/L (ref 7–177)

## 2016-04-05 NOTE — Telephone Encounter (Signed)
She may get labs every 2 months now.

## 2016-04-05 NOTE — Progress Notes (Signed)
Labs normal.

## 2016-04-05 NOTE — Progress Notes (Signed)
CK normal. Continue treatment as discussed. She may check labs every 2 months now.

## 2016-04-07 ENCOUNTER — Ambulatory Visit (INDEPENDENT_AMBULATORY_CARE_PROVIDER_SITE_OTHER): Payer: Medicare Other | Admitting: Rheumatology

## 2016-04-07 ENCOUNTER — Encounter: Payer: Self-pay | Admitting: Rheumatology

## 2016-04-07 ENCOUNTER — Telehealth: Payer: Self-pay | Admitting: Rheumatology

## 2016-04-07 ENCOUNTER — Telehealth: Payer: Self-pay | Admitting: Pharmacist

## 2016-04-07 VITALS — BP 153/66 | HR 85 | Resp 12 | Ht 66.5 in | Wt 158.0 lb

## 2016-04-07 DIAGNOSIS — D150 Benign neoplasm of thymus: Secondary | ICD-10-CM | POA: Diagnosis not present

## 2016-04-07 DIAGNOSIS — Z79899 Other long term (current) drug therapy: Secondary | ICD-10-CM | POA: Insufficient documentation

## 2016-04-07 DIAGNOSIS — M3501 Sicca syndrome with keratoconjunctivitis: Secondary | ICD-10-CM

## 2016-04-07 DIAGNOSIS — Z87891 Personal history of nicotine dependence: Secondary | ICD-10-CM

## 2016-04-07 DIAGNOSIS — R5383 Other fatigue: Secondary | ICD-10-CM | POA: Diagnosis not present

## 2016-04-07 DIAGNOSIS — M3390 Dermatopolymyositis, unspecified, organ involvement unspecified: Secondary | ICD-10-CM

## 2016-04-07 DIAGNOSIS — L659 Nonscarring hair loss, unspecified: Secondary | ICD-10-CM

## 2016-04-07 DIAGNOSIS — D4989 Neoplasm of unspecified behavior of other specified sites: Secondary | ICD-10-CM

## 2016-04-07 MED ORDER — PREDNISONE 5 MG PO TABS: ORAL_TABLET | ORAL | 0 refills | Status: DC

## 2016-04-07 MED ORDER — AZATHIOPRINE 50 MG PO TABS: ORAL_TABLET | ORAL | 0 refills | Status: DC

## 2016-04-07 NOTE — Telephone Encounter (Signed)
Received PA approval for patient's azathioprine.  PA Case ID: [id number].  Approval coverage is from 01/08/16 to 04/05/2038.  Called patient to update her.  She voiced understanding.

## 2016-04-07 NOTE — Patient Instructions (Addendum)
Imuran instructions:  Take 50 mg daily for two weeks, then get labs.  Then increase to 100 mg daily for two weeks, then get labs.  Then increase to 150 mg daily.  You will be due for labs again two weeks after starting the 150 mg daily dose.    Prednisone instructions:  Take 15 mg (3 of the 5 mg tablets) daily for one month, then take 12.5 mg (2 and one-half of the 5 mg tablets) daily for one month, then take 10 mg daily (2 of the 5 mg tablets) for one month.  We will plan to taper by 1 mg every month after that.    Standing Labs We placed an order today for your standing lab work.    Please come back and get your standing labs every 2 weeks times 3, then every 2 months.  We have open lab Monday through Friday from 8:30-11:30 AM and 1:30-4 PM at the office of Dr. Tresa Moore, PA.   The office is located at [address], [place], [place], [place] [address] No appointment is necessary.   Labs are drawn by [facility].  You may receive a bill from [facility] for your lab work.     Azathioprine tablets What is this medicine? AZATHIOPRINE (ay za THYE oh preen) suppresses the immune system. It is used to prevent organ rejection after a transplant. It is also used to treat rheumatoid arthritis. This medicine may be used for other purposes; ask your health care provider or pharmacist if you have questions. COMMON BRAND NAME(S): Azasan, Imuran What should I tell my health care provider before I take this medicine? They need to know if you have any of these conditions: -infection -kidney disease -liver disease -an unusual or allergic reaction to azathioprine, other medicines, lactose, foods, dyes, or preservatives -pregnant or trying to get pregnant -breast feeding How should I use this medicine? Take this medicine by mouth with a full glass of water. Follow the directions on the prescription label. Take your medicine at regular intervals. Do not take your medicine more often  than directed. Continue to take your medicine even if you feel better. Do not stop taking except on your doctor's advice. Talk to your pediatrician regarding the use of this medicine in children. Special care may be needed. Overdosage: If you think you have taken too much of this medicine contact a poison control center or emergency room at once. NOTE: This medicine is only for you. Do not share this medicine with others. What if I miss a dose? If you miss a dose, take it as soon as you can. If it is almost time for your next dose, take only that dose. Do not take double or extra doses. What may interact with this medicine? Do not take this medicine with any of the following medications: -febuxostat -mercaptopurine This medicine may also interact with the following medications: -allopurinol -aminosalicylates like sulfasalazine, mesalamine, balsalazide, and olsalazine -leflunomide -medicines called ACE inhibitors like benazepril, captopril, enalapril, fosinopril, quinapril, lisinopril, ramipril, and trandolapril -mycophenolate -sulfamethoxazole; trimethoprim -vaccines -warfarin This list may not describe all possible interactions. Give your health care provider a list of all the medicines, herbs, non-prescription drugs, or dietary supplements you use. Also tell them if you smoke, drink alcohol, or use illegal drugs. Some items may interact with your medicine. What should I watch for while using this medicine? Visit your doctor or health care professional for regular checks on your progress. You will need frequent blood  checks during the first few months you are receiving the medicine. If you get a cold or other infection while receiving this medicine, call your doctor or health care professional. Do not treat yourself. The medicine may increase your risk of getting an infection. Women should inform their doctor if they wish to become pregnant or think they might be pregnant. There is a  potential for serious side effects to an unborn child. Talk to your health care professional or pharmacist for more information. Men may have a reduced sperm count while they are taking this medicine. Talk to your health care professional for more information. This medicine may increase your risk of getting certain kinds of cancer. Talk to your doctor about healthy lifestyle choices, important screenings, and your risk. What side effects may I notice from receiving this medicine? Side effects that you should report to your doctor or health care professional as soon as possible: -allergic reactions like skin rash, itching or hives, swelling of the face, lips, or tongue -changes in vision -confusion -fever, chills, or any other sign of infection -loss of balance or coordination -severe stomach pain -unusual bleeding, bruising -unusually weak or tired -vomiting -yellowing of the eyes or skin Side effects that usually do not require medical attention (report to your doctor or health care professional if they continue or are bothersome): -hair loss -nausea This list may not describe all possible side effects. Call your doctor for medical advice about side effects. You may report side effects to FDA at 1-800-FDA-1088. Where should I keep my medicine? Keep out of the reach of children. Store at room temperature between 15 and 25 degrees C (59 and 77 degrees F). Protect from light. Throw away any unused medicine after the expiration date. NOTE: This sheet is a summary. It may not cover all possible information. If you have questions about this medicine, talk to your doctor, pharmacist, or health care provider.  2017 Elsevier/Gold Standard (2013-07-18 12:00:31)

## 2016-04-07 NOTE — Progress Notes (Signed)
Pharmacy Note  Subjective: Patient presents today to the [facility] to see Dr. Estanislado Pandy.  Patient seen by the pharmacist for counseling on azathioprine (Imuran).    Objective: CMP     Component Value Date/Time   NA 145 04/03/2016 0859   NA 137 01/16/2016   K 3.8 04/03/2016 0859   CL 108 04/03/2016 0859   CO2 25 04/03/2016 0859   GLUCOSE 95 04/03/2016 0859   BUN 20 04/03/2016 0859   BUN 11 01/16/2016   CREATININE 0.70 04/03/2016 0859   CALCIUM 9.3 04/03/2016 0859   PROT 6.4 04/03/2016 0859   ALBUMIN 3.8 04/03/2016 0859   AST 12 04/03/2016 0859   ALT 12 04/03/2016 0859   ALKPHOS 40 04/03/2016 0859   BILITOT 0.3 04/03/2016 0859   GFRNONAA >89 04/03/2016 0859   GFRAA >89 04/03/2016 0859    CBC    Component Value Date/Time   WBC 7.7 04/03/2016 0859   RBC 5.08 04/03/2016 0859   HGB 15.2 04/03/2016 0859   HCT 46.3 (H) 04/03/2016 0859   PLT 190 04/03/2016 0859   MCV 91.1 04/03/2016 0859   MCH 29.9 04/03/2016 0859   MCHC 32.8 04/03/2016 0859   RDW 14.6 04/03/2016 0859   LYMPHSABS 2,233 04/03/2016 0859   MONOABS 616 04/03/2016 0859   EOSABS 231 04/03/2016 0859   BASOSABS 0 04/03/2016 0859    TPMT: normal on 12/03/15  Assessment/Plan: Patient was counseled on the purpose, proper use, and adverse effects of azathioprine including risk of infection, nausea, rash, and hair loss.  Reviewed risk of cancer after long term use.  Discussed risk of myelosupression and reviewed importance of frequent lab work to monitor blood counts.  Reviewed drug-drug interactions including contraindication with allopurinol.  Provided patient with educational materials on azathioprine and answered all questions.  Patient consented to azathioprine.  Will upload consent into the media tab.    Elisabeth Most, Pharm.D., BCPS Clinical Pharmacist Pager: [phone] Phone: [phone] 04/07/2016 10:22 AM

## 2016-04-07 NOTE — Telephone Encounter (Signed)
Patient has questions for Dr. Koleen Nimrod about Imuran. Please call.

## 2016-04-07 NOTE — Telephone Encounter (Signed)
I returned patient's call.  Patient reports [facility] was unable to fill her prescription because it needed approval from her insurance.  I submitted PA request through [employer].  Will update patient once I receive results of PA request.     Elisabeth Most, Pharm.D., BCPS Clinical Pharmacist Pager: [phone] Phone: [phone] 04/07/2016 4:46 PM

## 2016-04-09 ENCOUNTER — Other Ambulatory Visit (HOSPITAL_COMMUNITY): Payer: Medicare Other

## 2016-04-13 ENCOUNTER — Inpatient Hospital Stay: Admit: 2016-04-13 | Payer: Medicare Other | Admitting: Thoracic Surgery (Cardiothoracic Vascular Surgery)

## 2016-04-13 SURGERY — MEDIAN STERNOTOMY
Anesthesia: General

## 2016-04-20 ENCOUNTER — Telehealth: Payer: Self-pay | Admitting: Rheumatology

## 2016-04-20 NOTE — Telephone Encounter (Signed)
Does patient need labs before increasing her Imuran dosage? Patient states she will come in the morning if she needs the labs. Please call patient today.

## 2016-04-20 NOTE — Telephone Encounter (Signed)
Left message to advise patient she will be due for labs. Patient to have labs every 2 weeks for three times then every two months.

## 2016-04-21 ENCOUNTER — Other Ambulatory Visit: Payer: Self-pay | Admitting: *Deleted

## 2016-04-21 DIAGNOSIS — Z79899 Other long term (current) drug therapy: Secondary | ICD-10-CM | POA: Diagnosis not present

## 2016-04-21 LAB — CBC WITH DIFFERENTIAL/PLATELET
BASOS ABS: 85 {cells}/uL (ref 0–200)
Basophils Relative: 1 %
EOS PCT: 2 %
Eosinophils Absolute: 170 cells/uL (ref 15–500)
HCT: 47.5 % — ABNORMAL HIGH (ref 35.0–45.0)
HEMOGLOBIN: 15.4 g/dL (ref 11.7–15.5)
LYMPHS ABS: 2635 {cells}/uL (ref 850–3900)
Lymphocytes Relative: 31 %
MCH: 29.4 pg (ref 27.0–33.0)
MCHC: 32.4 g/dL (ref 32.0–36.0)
MCV: 90.8 fL (ref 80.0–100.0)
MONOS PCT: 6 %
MPV: 10.8 fL (ref 7.5–12.5)
Monocytes Absolute: 510 cells/uL (ref 200–950)
NEUTROS ABS: 5100 {cells}/uL (ref 1500–7800)
Neutrophils Relative %: 60 %
PLATELETS: 206 10*3/uL (ref 140–400)
RBC: 5.23 MIL/uL — ABNORMAL HIGH (ref 3.80–5.10)
RDW: 14 % (ref 11.0–15.0)
WBC: 8.5 10*3/uL (ref 3.8–10.8)

## 2016-04-21 LAB — COMPLETE METABOLIC PANEL WITH GFR
ALBUMIN: 4 g/dL (ref 3.6–5.1)
ALK PHOS: 40 U/L (ref 33–130)
ALT: 12 U/L (ref 6–29)
AST: 12 U/L (ref 10–35)
BILIRUBIN TOTAL: 0.5 mg/dL (ref 0.2–1.2)
BUN: 17 mg/dL (ref 7–25)
CO2: 30 mmol/L (ref 20–31)
CREATININE: 0.76 mg/dL (ref 0.50–0.99)
Calcium: 9.4 mg/dL (ref 8.6–10.4)
Chloride: 105 mmol/L (ref 98–110)
GFR, Est African American: >89 mL/min (ref >=60)
GFR, Est Non African American: 83 mL/min (ref >=60)
GLUCOSE: 95 mg/dL (ref 65–99)
Potassium: 3.9 mmol/L (ref 3.5–5.3)
SODIUM: 144 mmol/L (ref 135–146)
TOTAL PROTEIN: 6.6 g/dL (ref 6.1–8.1)

## 2016-04-23 NOTE — Progress Notes (Signed)
WNLs

## 2016-05-04 ENCOUNTER — Other Ambulatory Visit: Payer: Self-pay | Admitting: *Deleted

## 2016-05-04 DIAGNOSIS — Z79899 Other long term (current) drug therapy: Secondary | ICD-10-CM | POA: Diagnosis not present

## 2016-05-04 DIAGNOSIS — M3390 Dermatopolymyositis, unspecified, organ involvement unspecified: Secondary | ICD-10-CM | POA: Diagnosis not present

## 2016-05-04 LAB — CBC WITH DIFFERENTIAL/PLATELET
Basophils Absolute: 91 cells/uL (ref 0–200)
Basophils Relative: 1 %
EOS ABS: 637 {cells}/uL — AB (ref 15–500)
Eosinophils Relative: 7 %
HEMATOCRIT: 47.9 % — AB (ref 35.0–45.0)
Hemoglobin: 15.8 g/dL — ABNORMAL HIGH (ref 11.7–15.5)
LYMPHS PCT: 13 %
Lymphs Abs: 1183 cells/uL (ref 850–3900)
MCH: 29.9 pg (ref 27.0–33.0)
MCHC: 33 g/dL (ref 32.0–36.0)
MCV: 90.5 fL (ref 80.0–100.0)
MONO ABS: 728 {cells}/uL (ref 200–950)
MPV: 10.5 fL (ref 7.5–12.5)
Monocytes Relative: 8 %
Neutro Abs: 6461 cells/uL (ref 1500–7800)
Neutrophils Relative %: 71 %
Platelets: 183 10*3/uL (ref 140–400)
RBC: 5.29 MIL/uL — ABNORMAL HIGH (ref 3.80–5.10)
RDW: 13.8 % (ref 11.0–15.0)
WBC: 9.1 10*3/uL (ref 3.8–10.8)

## 2016-05-05 ENCOUNTER — Ambulatory Visit (INDEPENDENT_AMBULATORY_CARE_PROVIDER_SITE_OTHER): Payer: Medicare Other | Admitting: Rheumatology

## 2016-05-05 ENCOUNTER — Encounter: Payer: Self-pay | Admitting: Rheumatology

## 2016-05-05 VITALS — BP 151/85 | HR 99 | Resp 12 | Wt 155.0 lb

## 2016-05-05 DIAGNOSIS — R5383 Other fatigue: Secondary | ICD-10-CM | POA: Diagnosis not present

## 2016-05-05 DIAGNOSIS — Z79899 Other long term (current) drug therapy: Secondary | ICD-10-CM

## 2016-05-05 DIAGNOSIS — Z87891 Personal history of nicotine dependence: Secondary | ICD-10-CM | POA: Diagnosis not present

## 2016-05-05 DIAGNOSIS — D4989 Neoplasm of unspecified behavior of other specified sites: Secondary | ICD-10-CM

## 2016-05-05 DIAGNOSIS — M3501 Sicca syndrome with keratoconjunctivitis: Secondary | ICD-10-CM

## 2016-05-05 DIAGNOSIS — M2550 Pain in unspecified joint: Secondary | ICD-10-CM

## 2016-05-05 DIAGNOSIS — M3390 Dermatopolymyositis, unspecified, organ involvement unspecified: Secondary | ICD-10-CM | POA: Diagnosis not present

## 2016-05-05 DIAGNOSIS — D150 Benign neoplasm of thymus: Secondary | ICD-10-CM | POA: Diagnosis not present

## 2016-05-05 LAB — COMPLETE METABOLIC PANEL WITH GFR
ALT: 17 U/L (ref 6–29)
AST: 21 U/L (ref 10–35)
Albumin: 4 g/dL (ref 3.6–5.1)
Alkaline Phosphatase: 47 U/L (ref 33–130)
BUN: 11 mg/dL (ref 7–25)
CALCIUM: 9.8 mg/dL (ref 8.6–10.4)
CHLORIDE: 104 mmol/L (ref 98–110)
CO2: 28 mmol/L (ref 20–31)
Creat: 0.84 mg/dL (ref 0.50–0.99)
GFR, EST AFRICAN AMERICAN: 84 mL/min (ref >=60)
GFR, EST NON AFRICAN AMERICAN: 73 mL/min (ref >=60)
Glucose, Bld: 106 mg/dL — ABNORMAL HIGH (ref 65–99)
POTASSIUM: 4.2 mmol/L (ref 3.5–5.3)
Sodium: 143 mmol/L (ref 135–146)
Total Bilirubin: 0.7 mg/dL (ref 0.2–1.2)
Total Protein: 7 g/dL (ref 6.1–8.1)

## 2016-05-05 LAB — CK: Total CK: 21 U/L (ref 7–177)

## 2016-05-05 NOTE — Progress Notes (Signed)
Office Visit Note  Patient: Marisa Orozco             Date of Birth: 1950-08-01           MRN: [id number]             PCP: Gar Ponto, MD Referring: Caryl Bis, MD Visit Date: 05/05/2016 Occupation: '@GUAROCC'$ @    Subjective:  Pain of the Left Knee   History of Present Illness: Marisa Orozco is a 66 y.o. 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 She states she started on Imuran 50 mg a day on January 3 and increased to 100 mg a day on January 18. She's been on prednisone 15 mg a day since mid December until now. She states that she's been having few episodes of palpitations. She's also noticing increased joint pain which she describes in her him bilateral thumb bilateral knee joints and across her shoulders. Patient reports on 05/02/2016 she developed symptoms of dysuria for which she was given a tablet of Macrobid and her symptoms resolved.  Activities of Daily Living:  Patient reports morning stiffness for 0 minute.   Patient Reports nocturnal pain. In her knee joints Difficulty dressing/grooming: Denies Difficulty climbing stairs: Denies Difficulty getting out of chair: Reports Difficulty using hands for taps, buttons, cutlery, and/or writing: Denies   Review of Systems  Constitutional: Negative for fatigue, night sweats, weight gain, weight loss and weakness.  HENT: Negative for mouth sores, trouble swallowing, trouble swallowing, mouth dryness and nose dryness.   Eyes: Negative for pain, redness, visual disturbance and dryness.  Respiratory: Negative for cough, shortness of breath and difficulty breathing.   Cardiovascular: Positive for palpitations. Negative for chest pain, hypertension, irregular heartbeat and swelling in legs/feet.  Gastrointestinal: Negative for blood in stool, constipation and diarrhea.  Endocrine: Negative for increased urination.  Genitourinary: Negative for vaginal dryness.  Musculoskeletal: Positive for arthralgias, joint pain, myalgias and  myalgias. Negative for joint swelling, muscle weakness, morning stiffness and muscle tenderness.  Skin: Negative for color change, rash, hair loss, skin tightness, ulcers and sensitivity to sunlight.  Allergic/Immunologic: Negative for susceptible to infections.  Neurological: Negative for dizziness, memory loss and night sweats.  Hematological: Negative for swollen glands.  Psychiatric/Behavioral: Positive for sleep disturbance. Negative for depressed mood. The patient is not nervous/anxious.     PMFS History:  Patient Active Problem List   Diagnosis Date Noted  . High risk medication use 04/07/2016  . Sjogren's disease ([place]) 02/04/2016  . Dermatomyositis ([place]) 02/04/2016  . Former smoker 02/04/2016  . 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 Fatigue 02/15/2012    Past Medical History:  Diagnosis Date  . Allergy   . Cancer ([place])    basal cell carcinoma - face  . Dermatomyositis ([place]) 02/04/2016   Jo 1 Positive  Skin Bx- Subtle interface Dermatitis   . Former smoker 02/04/2016  . GERD (gastroesophageal reflux disease)    otc med prn  . PONV (postoperative nausea and vomiting)   . Sjogren's disease ([place]) 02/04/2016   Positive ANA, Positive Ro, Positive CCP, Negative RF, Parotitis, Positive Sicca  . Sjogren's syndrome ([place])    from MD note    Family History  Problem Relation Age of Onset  . COPD Mother   . Heart disease Mother   . Diabetes Father   . Kidney disease Father   . Colon cancer Neg Hx   . Esophageal cancer Neg Hx   . Stomach cancer Neg Hx   . Rectal cancer Neg Hx  Past Surgical History:  Procedure Laterality Date  . BREAST SURGERY     augmentation  . BUNIONECTOMY     right foot  . COLONOSCOPY    . DIAGNOSTIC LAPAROSCOPY    . DILATION AND CURETTAGE OF UTERUS  04/15/2011   Procedure: DILATATION AND CURETTAGE;  Surgeon: [name];  Location: [facility];  Service: Gynecology;  Laterality: N/A;  . MUSCLE BIOPSY Left 01/02/2016   Procedure: MUSCLE BIOPSY LEFT DELTOID;   Surgeon: [name], MD;  Location: [facility];  Service: General;  Laterality: Left;  left deltoid muscle bx  . NASAL SEPTUM SURGERY    . SVD     x 2  . TONSILLECTOMY    . WISDOM TOOTH EXTRACTION     Social History   Social History Narrative  . No narrative on file     Objective: Vital Signs: BP (!) 151/85   Pulse 99   Resp 12   Wt 155 lb (70.3 kg)   BMI 24.64 kg/m    Physical Exam  Constitutional: She is oriented to person, place, and time. She appears well-developed and well-nourished.  HENT:  Head: Normocephalic and atraumatic.  Eyes: Conjunctivae and EOM are normal.  Neck: Normal range of motion.  Cardiovascular: Normal rate, regular rhythm, normal heart sounds and intact distal pulses.   Pulmonary/Chest: Effort normal and breath sounds normal.  Abdominal: Soft. Bowel sounds are normal.  Lymphadenopathy:    She has no cervical adenopathy.  Neurological: She is alert and oriented to person, place, and time.  Skin: Skin is warm and dry. Capillary refill takes less than 2 seconds.  Psychiatric: She has a normal mood and affect. Her behavior is normal.  Nursing note and vitals reviewed.    Musculoskeletal Exam: C-spine and thoracic lumbar spine good range of motion. Shoulder joints elbow joints wrist joints MCPs PIPs DIPs with good range of motion. She has some tenderness on palpation over her left second PIP joint. No synovitis was noted. Hip joints knee joints ankles MTPs PIPs with good range of motion. No warmth swelling or effusion in her knee joints was noted.  CDAI Exam: No CDAI exam completed.    Investigation: Findings:  05/04/16: CK 21   CMP     Component Value Date/Time   NA 143 05/04/2016 0845   NA 137 01/16/2016   K 4.2 05/04/2016 0845   CL 104 05/04/2016 0845   CO2 28 05/04/2016 0845   GLUCOSE 106 (H) 05/04/2016 0845   BUN 11 05/04/2016 0845   BUN 11 01/16/2016   CREATININE 0.84 05/04/2016 0845   CALCIUM 9.8 05/04/2016  0845   PROT 7.0 05/04/2016 0845   ALBUMIN 4.0 05/04/2016 0845   AST 21 05/04/2016 0845   ALT 17 05/04/2016 0845   ALKPHOS 47 05/04/2016 0845   BILITOT 0.7 05/04/2016 0845   GFRNONAA 73 05/04/2016 0845   GFRAA 84 05/04/2016 0845   CBC    Component Value Date/Time   WBC 9.1 05/04/2016 0845   RBC 5.29 (H) 05/04/2016 0845   HGB 15.8 (H) 05/04/2016 0845   HCT 47.9 (H) 05/04/2016 0845   PLT 183 05/04/2016 0845   MCV 90.5 05/04/2016 0845   MCH 29.9 05/04/2016 0845   MCHC 33.0 05/04/2016 0845   RDW 13.8 05/04/2016 0845   LYMPHSABS 1,183 05/04/2016 0845   MONOABS 728 05/04/2016 0845   EOSABS 637 (H) 05/04/2016 0845   BASOSABS 91 05/04/2016 0845    Imaging: No results found.  Speciality  Comments: No specialty comments available.   Procedures:  No procedures performed Allergies: Codeine; Flagyl [metronidazole hcl]; Nexium [esomeprazole magnesium]; and Sulfa drugs cross reactors   Assessment / Plan:     Visit Diagnoses: Dermatomyositis [facility]): Patient had no recurrence of rash. No increased muscle weakness or tenderness was noted on examination. Although she believes that her symptoms of dermatomyositis may be flaring. I reviewed the most recent labs with her CKs normal and CBC and comprehensive metabolic panel are stable. She's concerned that she may not be flaring right now but she may flare in future and she has some major event coming in her life which she does not want to miss. After detailed discussion we decided to increase her prednisone to 20 mg by mouth daily for a week and then she can go back to 15 mg by mouth daily she's also concerned that Imuran could be causing the side effects. I've advised her to hold off increasing her Imuran to 150 and stay on 100 mg a day for the next couple of weeks and she how she tolerates it. If she tolerates it well then we can increase  Imuran to 150 mg by mouth daily if she does not then that option would be to go back on methotrexate. She was in  agreement with the above plan  Sjogren's syndrome with keratoconjunctivitis sicca [facility]): Her sicca symptoms are tolerable with over-the-counter medications.  Polyarthralgia: She complains of pain in her shoulders hands and her knee joints. I do not see any synovitis on examination.  High risk medication use - Pred 15 mg po qd, Imuran 100 mg po qd, PLQ200Am 100 mg PM. Labs are stable.  Thymoma  Former smoker    Orders: No orders of the defined types were placed in this encounter.  No orders of the defined types were placed in this encounter.   Face-to-face time spent with patient was 30 minutes. 50% of time was spent in counseling and coordination of care.  Follow-Up Instructions: Return for Dermatomyositis. as scheduled in March   Bo Merino, MD  Note - This record has been created using [employer].  Chart creation errors have been sought, but may not always  have been located. Such creation errors do not reflect on  the standard of medical care.

## 2016-05-11 ENCOUNTER — Telehealth: Payer: Self-pay | Admitting: Rheumatology

## 2016-05-11 NOTE — Telephone Encounter (Signed)
1) why was imuran stopped?   Is it causing side effects?  2) does she want to switch to mtx?  If so, she will need appt w/ dr. Keturah Barre.  Until then, if dr. Keturah Barre put her on imuran and if imuran is not causing problems, then it is best to stay on it till she sees dr. Keturah Barre. At f-u appt (unless she is having reaction to imuran or side effect.

## 2016-05-11 NOTE — Telephone Encounter (Signed)
Patient states she was seen last week and was told to increase her prednisone for the joint pain she is having, which she did. But patient states she also stopped the imuran on Saturday and has been feeling better after stopping that. Please call patient.

## 2016-05-11 NOTE — Telephone Encounter (Signed)
Patient states she was seen by Dr. Estanislado Pandy on Tuesday. Patient she was instructed not to go up on the Imuran. Patient states she was instructed to increase her Prednisone by 5 mg. Patient states by Wednesday she decided to decrease her Imuran and then stopped her Imuran on Saturday morning. Patient states she has started feeling better by Saturday evening. Patient states she doesn't;t feel Imuran is going to be the medication for her. Patient states that Dr. Estanislado Pandy had also mentioned Methotrexate to her as another option. Patient states she has discussed this medication at length at previous visits.

## 2016-05-12 NOTE — Telephone Encounter (Signed)
Patient states she was having increased joint pain while on the Imuran. Patient states she is no longer having the joint pain since she has stopped the Imuran. Patient states she feels like the Imuran was causing her to have the joint pain.

## 2016-05-13 NOTE — Telephone Encounter (Signed)
I call patient and discuss her symptoms with her.Patient reports that when she was on Imuran she started having joint pain.The dose was 100 mg.When she discontinued the Imuran, her joint pain went away.She thinks that the Imuran was responsible for joint pain.She is willing to use methotrexate if that is an option for her.Her appointment date with our offices 06/10/2016.I advised her that we can switch her to methotrexate at that office visit.She wants to come couple of days before the office visit to get updated lab work.She has standing order already.In the meanwhile she had a flare and therefore she was advised to increase her prednisone temporarily.I've advised her to do 15 mg for the next 2 weeks and then go to 12.5 mg for 2 more weeks. By that time it'll be her office visit date and then we can discuss how we want to proceed.

## 2016-05-27 NOTE — Progress Notes (Signed)
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   History of Present Illness: Marisa Orozco is a 66 y.o. female with history of dermatomyositis. According to patient in January while she was on Imuran 100 mg by mouth daily for about 11 days she started experiencing arthralgias. She decided to taper off Imuran and then she completely got off the Imuran for arthralgias resolved. She has not taken Imuran in the last 1 month. She's been tapering her prednisone down and she's down at 12.5 mg a day currently she continues to take Plaquenil 300 mg a day. He noticed a knot on her right first PIP.  Activities of Daily Living:  Patient reports morning stiffness for 0 minutes.   Patient Denies nocturnal pain.  Difficulty dressing/grooming: Denies Difficulty climbing stairs: Denies Difficulty getting out of chair: Denies Difficulty using hands for taps, buttons, cutlery, and/or writing: Denies   Review of Systems  Constitutional: Negative for fatigue, night sweats, weight gain, weight loss and weakness.  HENT: Negative for mouth sores, trouble swallowing, trouble swallowing, mouth dryness and nose dryness.   Eyes: Negative for pain, redness, visual disturbance and dryness.  Respiratory: Negative for cough, shortness of breath and difficulty breathing.   Cardiovascular: Negative for chest pain, palpitations, hypertension, irregular heartbeat and swelling in legs/feet.  Gastrointestinal: Negative for blood in stool, constipation and diarrhea.  Endocrine: Negative for increased urination.  Genitourinary: Negative for vaginal dryness.  Musculoskeletal: Negative for arthralgias, joint pain, joint swelling, myalgias, muscle weakness, morning stiffness, muscle tenderness and myalgias.    Skin: Negative for color change, rash, hair loss, skin tightness, ulcers and sensitivity to sunlight.  Allergic/Immunologic: Negative for susceptible to infections.  Neurological: Negative for dizziness, memory loss and night sweats.  Hematological: Negative for swollen glands.  Psychiatric/Behavioral: Negative for depressed mood and sleep disturbance. The patient is not nervous/anxious.     PMFS History:  Patient Active Problem List   Diagnosis Date Noted  . High risk medication use 04/07/2016  . Sjogren's disease ([place]) 02/04/2016  . Dermatomyositis ([place]) 02/04/2016  . Former smoker 02/04/2016  . 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 Fatigue 02/15/2012    Past Medical History:  Diagnosis Date  . Allergy   . Cancer ([place])    basal cell carcinoma - face  . Dermatomyositis ([place]) 02/04/2016   Jo 1 Positive  Skin Bx- Subtle interface Dermatitis   . Former smoker 02/04/2016  . GERD (gastroesophageal reflux disease)    otc med prn  . PONV (postoperative nausea and vomiting)   . Sjogren's disease ([place]) 02/04/2016   Positive ANA, Positive Ro, Positive CCP, Negative RF, Parotitis, Positive Sicca  . Sjogren's syndrome ([place])    from MD note    Family History  Problem Relation Age of Onset  . COPD Mother   . Heart disease Mother   . Diabetes Father   . Kidney disease Father   . Colon cancer Neg Hx   . Esophageal cancer Neg Hx   . Stomach cancer Neg Hx   . Rectal cancer Neg Hx    Past Surgical History:  Procedure Laterality Date  . BREAST SURGERY  augmentation  . BUNIONECTOMY     right foot  . COLONOSCOPY    . DIAGNOSTIC LAPAROSCOPY    . DILATION AND CURETTAGE OF UTERUS  04/15/2011   Procedure: DILATATION AND CURETTAGE;  Surgeon: [name];  Location: [facility];  Service: Gynecology;  Laterality: N/A;  . MUSCLE BIOPSY Left 01/02/2016   Procedure: MUSCLE BIOPSY LEFT DELTOID;  Surgeon: [name], MD;  Location: [facility];  Service: General;  Laterality: Left;  left  deltoid muscle bx  . NASAL SEPTUM SURGERY    . SVD     x 2  . TONSILLECTOMY    . WISDOM TOOTH EXTRACTION     Social History   Social History Narrative  . No narrative on file     Objective: Vital Signs: BP 138/78   Pulse 78   Resp 16   Wt 156 lb (70.8 kg)   BMI 24.80 kg/m    Physical Exam  Constitutional: She is oriented to person, place, and time. She appears well-developed and well-nourished.  HENT:  Head: Normocephalic and atraumatic.  Eyes: Conjunctivae and EOM are normal.  Neck: Normal range of motion.  Cardiovascular: Normal rate, regular rhythm, normal heart sounds and intact distal pulses.   Pulmonary/Chest: Effort normal and breath sounds normal.  Abdominal: Soft. Bowel sounds are normal.  Lymphadenopathy:    She has no cervical adenopathy.  Neurological: She is alert and oriented to person, place, and time.  Skin: Skin is warm and dry. Capillary refill takes less than 2 seconds.  Psychiatric: She has a normal mood and affect. Her behavior is normal.  Nursing note and vitals reviewed.    Musculoskeletal Exam: C-spine and thoracic lumbar spine good range of motion. Shoulder joints elbow joints wrist joint MCPs PIPs DIPs with good range of motion with no synovitis hip joints knee joints ankles MTPs PIPs with good range of motion with no synovitis.  CDAI Exam: No CDAI exam completed.    Investigation: Findings:  September 2017:  Her C-ANCA was 1:240.  Her myositis panel showed JO-1 antibody was positive, her sed rate was 12, CK was elevated at 757, vitamin D 43 and TB test was indeterminate.  She had a PPD test done which she brought the report of today on 01/03/2016, the PPD was negative.  She also mentions that she had colonoscopy in 2013 which was normal, mammogram in 2017 which was normal and Pap smear in 2017 which was normal.  04/03/2016 CK 18, 05/04/2016 CK 21, CBC normal, CMP normal  Orders Only on 06/08/2016  Component Date Value Ref Range Status  .  Total CK 06/08/2016 21  7 - 177 U/L Final  . WBC 06/08/2016 7.0  3.8 - 10.8 K/uL Final  . RBC 06/08/2016 5.14* 3.80 - 5.10 MIL/uL Final  . Hemoglobin 06/08/2016 15.7* 11.7 - 15.5 g/dL Final  . HCT 06/08/2016 46.8* 35.0 - 45.0 % Final  . MCV 06/08/2016 91.1  80.0 - 100.0 fL Final  . MCH 06/08/2016 30.5  27.0 - 33.0 pg Final  . MCHC 06/08/2016 33.5  32.0 - 36.0 g/dL Final  . RDW 06/08/2016 13.4  11.0 - 15.0 % Final  . Platelets 06/08/2016 182  140 - 400 K/uL Final  . MPV 06/08/2016 11.1  7.5 - 12.5 fL Final  . Neutro Abs 06/08/2016 3920  1,500 - 7,800 cells/uL Final  . Lymphs Abs 06/08/2016 2450  850 - 3,900 cells/uL Final  . Monocytes Absolute 06/08/2016 490  200 - 950  cells/uL Final  . Eosinophils Absolute 06/08/2016 140  15 - 500 cells/uL Final  . Basophils Absolute 06/08/2016 0  0 - 200 cells/uL Final  . Neutrophils Relative % 06/08/2016 56  % Final  . Lymphocytes Relative 06/08/2016 35  % Final  . Monocytes Relative 06/08/2016 7  % Final  . Eosinophils Relative 06/08/2016 2  % Final  . Basophils Relative 06/08/2016 0  % Final  . Smear Review 06/08/2016 Criteria for review not met   Final  . Sodium 06/08/2016 144  135 - 146 mmol/L Final  . Potassium 06/08/2016 3.9  3.5 - 5.3 mmol/L Final  . Chloride 06/08/2016 105  98 - 110 mmol/L Final  . CO2 06/08/2016 29  20 - 31 mmol/L Final  . Glucose, Bld 06/08/2016 90  65 - 99 mg/dL Final  . BUN 06/08/2016 17  7 - 25 mg/dL Final  . Creat 06/08/2016 0.85  0.50 - 0.99 mg/dL Final   Comment:   For patients > or = 66 years of age: The upper reference limit for Creatinine is approximately 13% higher for people identified as African-American.     . Total Bilirubin 06/08/2016 0.7  0.2 - 1.2 mg/dL Final  . Alkaline Phosphatase 06/08/2016 40  33 - 130 U/L Final  . AST 06/08/2016 13  10 - 35 U/L Final  . ALT 06/08/2016 14  6 - 29 U/L Final  . Total Protein 06/08/2016 6.7  6.1 - 8.1 g/dL Final  . Albumin 06/08/2016 4.2  3.6 - 5.1 g/dL Final  .  Calcium 06/08/2016 9.9  8.6 - 10.4 mg/dL Final  . GFR, Est African American 06/08/2016 83  >=60 mL/min Final  . GFR, Est Non African American 06/08/2016 72  >=60 mL/min Final  Orders Only on 05/04/2016  Component Date Value Ref Range Status  . Total CK 05/04/2016 21  7 - 177 U/L Final  . WBC 05/04/2016 9.1  3.8 - 10.8 K/uL Final  . RBC 05/04/2016 5.29* 3.80 - 5.10 MIL/uL Final  . Hemoglobin 05/04/2016 15.8* 11.7 - 15.5 g/dL Final  . HCT 05/04/2016 47.9* 35.0 - 45.0 % Final  . MCV 05/04/2016 90.5  80.0 - 100.0 fL Final  . MCH 05/04/2016 29.9  27.0 - 33.0 pg Final  . MCHC 05/04/2016 33.0  32.0 - 36.0 g/dL Final  . RDW 05/04/2016 13.8  11.0 - 15.0 % Final  . Platelets 05/04/2016 183  140 - 400 K/uL Final  . MPV 05/04/2016 10.5  7.5 - 12.5 fL Final  . Neutro Abs 05/04/2016 6461  1,500 - 7,800 cells/uL Final  . Lymphs Abs 05/04/2016 1183  850 - 3,900 cells/uL Final  . Monocytes Absolute 05/04/2016 728  200 - 950 cells/uL Final  . Eosinophils Absolute 05/04/2016 637* 15 - 500 cells/uL Final  . Basophils Absolute 05/04/2016 91  0 - 200 cells/uL Final  . Neutrophils Relative % 05/04/2016 71  % Final  . Lymphocytes Relative 05/04/2016 13  % Final  . Monocytes Relative 05/04/2016 8  % Final  . Eosinophils Relative 05/04/2016 7  % Final  . Basophils Relative 05/04/2016 1  % Final  . Smear Review 05/04/2016 Criteria for review not met   Final  . Sodium 05/04/2016 143  135 - 146 mmol/L Final  . Potassium 05/04/2016 4.2  3.5 - 5.3 mmol/L Final  . Chloride 05/04/2016 104  98 - 110 mmol/L Final  . CO2 05/04/2016 28  20 - 31 mmol/L Final  . Glucose, Bld 05/04/2016 106*  65 - 99 mg/dL Final  . BUN 05/04/2016 11  7 - 25 mg/dL Final  . Creat 05/04/2016 0.84  0.50 - 0.99 mg/dL Final   Comment:   For patients > or = 66 years of age: The upper reference limit for Creatinine is approximately 13% higher for people identified as African-American.     . Total Bilirubin 05/04/2016 0.7  0.2 - 1.2 mg/dL  Final  . Alkaline Phosphatase 05/04/2016 47  33 - 130 U/L Final  . AST 05/04/2016 21  10 - 35 U/L Final  . ALT 05/04/2016 17  6 - 29 U/L Final  . Total Protein 05/04/2016 7.0  6.1 - 8.1 g/dL Final  . Albumin 05/04/2016 4.0  3.6 - 5.1 g/dL Final  . Calcium 05/04/2016 9.8  8.6 - 10.4 mg/dL Final  . GFR, Est African American 05/04/2016 84  >=60 mL/min Final  . GFR, Est Non African American 05/04/2016 73  >=60 mL/min Final  Orders Only on 04/21/2016  Component Date Value Ref Range Status  . WBC 04/21/2016 8.5  3.8 - 10.8 K/uL Final  . RBC 04/21/2016 5.23* 3.80 - 5.10 MIL/uL Final  . Hemoglobin 04/21/2016 15.4  11.7 - 15.5 g/dL Final  . HCT 04/21/2016 47.5* 35.0 - 45.0 % Final  . MCV 04/21/2016 90.8  80.0 - 100.0 fL Final  . MCH 04/21/2016 29.4  27.0 - 33.0 pg Final  . MCHC 04/21/2016 32.4  32.0 - 36.0 g/dL Final  . RDW 04/21/2016 14.0  11.0 - 15.0 % Final  . Platelets 04/21/2016 206  140 - 400 K/uL Final  . MPV 04/21/2016 10.8  7.5 - 12.5 fL Final  . Neutro Abs 04/21/2016 5100  1,500 - 7,800 cells/uL Final  . Lymphs Abs 04/21/2016 2635  850 - 3,900 cells/uL Final  . Monocytes Absolute 04/21/2016 510  200 - 950 cells/uL Final  . Eosinophils Absolute 04/21/2016 170  15 - 500 cells/uL Final  . Basophils Absolute 04/21/2016 85  0 - 200 cells/uL Final  . Neutrophils Relative % 04/21/2016 60  % Final  . Lymphocytes Relative 04/21/2016 31  % Final  . Monocytes Relative 04/21/2016 6  % Final  . Eosinophils Relative 04/21/2016 2  % Final  . Basophils Relative 04/21/2016 1  % Final  . Smear Review 04/21/2016 Criteria for review not met   Final  . Sodium 04/21/2016 144  135 - 146 mmol/L Final  . Potassium 04/21/2016 3.9  3.5 - 5.3 mmol/L Final  . Chloride 04/21/2016 105  98 - 110 mmol/L Final  . CO2 04/21/2016 30  20 - 31 mmol/L Final  . Glucose, Bld 04/21/2016 95  65 - 99 mg/dL Final  . BUN 04/21/2016 17  7 - 25 mg/dL Final  . Creat 04/21/2016 0.76  0.50 - 0.99 mg/dL Final   Comment:   For  patients > or = 66 years of age: The upper reference limit for Creatinine is approximately 13% higher for people identified as African-American.     . Total Bilirubin 04/21/2016 0.5  0.2 - 1.2 mg/dL Final  . Alkaline Phosphatase 04/21/2016 40  33 - 130 U/L Final  . AST 04/21/2016 12  10 - 35 U/L Final  . ALT 04/21/2016 12  6 - 29 U/L Final  . Total Protein 04/21/2016 6.6  6.1 - 8.1 g/dL Final  . Albumin 04/21/2016 4.0  3.6 - 5.1 g/dL Final  . Calcium 04/21/2016 9.4  8.6 - 10.4 mg/dL Final  . GFR, Est African  American 04/21/2016 >89  >=60 mL/min Final  . GFR, Est Non African American 04/21/2016 83  >=60 mL/min Final  Lab on 04/03/2016  Component Date Value Ref Range Status  . WBC 04/03/2016 7.7  3.8 - 10.8 K/uL Final  . RBC 04/03/2016 5.08  3.80 - 5.10 MIL/uL Final  . Hemoglobin 04/03/2016 15.2  11.7 - 15.5 g/dL Final  . HCT 04/03/2016 46.3* 35.0 - 45.0 % Final  . MCV 04/03/2016 91.1  80.0 - 100.0 fL Final  . MCH 04/03/2016 29.9  27.0 - 33.0 pg Final  . MCHC 04/03/2016 32.8  32.0 - 36.0 g/dL Final  . RDW 04/03/2016 14.6  11.0 - 15.0 % Final  . Platelets 04/03/2016 190  140 - 400 K/uL Final  . MPV 04/03/2016 10.6  7.5 - 12.5 fL Final  . Neutro Abs 04/03/2016 4620  1,500 - 7,800 cells/uL Final  . Lymphs Abs 04/03/2016 2233  850 - 3,900 cells/uL Final  . Monocytes Absolute 04/03/2016 616  200 - 950 cells/uL Final  . Eosinophils Absolute 04/03/2016 231  15 - 500 cells/uL Final  . Basophils Absolute 04/03/2016 0  0 - 200 cells/uL Final  . Neutrophils Relative % 04/03/2016 60  % Final  . Lymphocytes Relative 04/03/2016 29  % Final  . Monocytes Relative 04/03/2016 8  % Final  . Eosinophils Relative 04/03/2016 3  % Final  . Basophils Relative 04/03/2016 0  % Final  . Smear Review 04/03/2016 Criteria for review not met   Final  . Sodium 04/03/2016 145  135 - 146 mmol/L Final  . Potassium 04/03/2016 3.8  3.5 - 5.3 mmol/L Final  . Chloride 04/03/2016 108  98 - 110 mmol/L Final  . CO2  04/03/2016 25  20 - 31 mmol/L Final  . Glucose, Bld 04/03/2016 95  65 - 99 mg/dL Final  . BUN 04/03/2016 20  7 - 25 mg/dL Final  . Creat 04/03/2016 0.70  0.50 - 0.99 mg/dL Final   Comment:   For patients > or = 66 years of age: The upper reference limit for Creatinine is approximately 13% higher for people identified as African-American.     . Total Bilirubin 04/03/2016 0.3  0.2 - 1.2 mg/dL Final  . Alkaline Phosphatase 04/03/2016 40  33 - 130 U/L Final  . AST 04/03/2016 12  10 - 35 U/L Final  . ALT 04/03/2016 12  6 - 29 U/L Final  . Total Protein 04/03/2016 6.4  6.1 - 8.1 g/dL Final  . Albumin 04/03/2016 3.8  3.6 - 5.1 g/dL Final  . Calcium 04/03/2016 9.3  8.6 - 10.4 mg/dL Final  . GFR, Est African American 04/03/2016 >89  >=60 mL/min Final  . GFR, Est Non African American 04/03/2016 >89  >=60 mL/min Final  Orders Only on 04/03/2016  Component Date Value Ref Range Status  . Total CK 04/03/2016 18  7 - 177 U/L Final     Imaging: No results found.  Speciality Comments: No specialty comments available.    Procedures:  No procedures performed Allergies: Codeine; Flagyl [metronidazole hcl]; Nexium [esomeprazole magnesium]; Imuran [azathioprine]; and Sulfa drugs cross reactors   Assessment / Plan:     Visit Diagnoses: Dermatomyositis [facility]): Patient is clinically doing fairly well right now. Her CKs are normal. She's on tapering dose of prednisone at 12.5 mg. She's also on Plaquenil which is controlled her rash really well. She came off Imuran due to arthralgias. I discussed the option of methotrexate today. She is hesitant to  go on any deep other DMARD's. She states she's on Facebook with lot of  other patients with dermatomyositis and several of the patient's have not taken any DMARD's and did really well. She also recalls that when she had dermatomyositis as a child she never took any DMARD's.We had detailed discussion regarding this. I would respect her wishes and will taper  prednisone gradually and if she has a flare then we may have to introduce methotrexate. She and her husband express understanding. She was given a handout on methotrexate again in the side effects were again reviewed. She wants to sign a consent in case she has to start methotrexate in future.  Sjogren's syndrome with keratoconjunctivitis sicca ([place]): Stable  Other fatigue: Resolved  Former smoker  High risk medication use Plaquenil 300 mg by mouth daily, prednisone 12.5 mg by mouth daily  Thymoma : She is scheduled to have repeat PET scan   Orders: No orders of the defined types were placed in this encounter.  No orders of the defined types were placed in this encounter.   Face-to-face time spent with patient was 30 minutes. 50% of time was spent in counseling and coordination of care.  Follow-Up Instructions: Return in about 2 months (around 08/10/2016) for Dermatomyositis.   Bo Merino, MD  Note - This record has been created using Editor, commissioning.  Chart creation errors have been sought, but may not always  have been located. Such creation errors do not reflect on  the standard of medical care.

## 2016-06-08 ENCOUNTER — Other Ambulatory Visit: Payer: Self-pay | Admitting: *Deleted

## 2016-06-08 DIAGNOSIS — M3390 Dermatopolymyositis, unspecified, organ involvement unspecified: Secondary | ICD-10-CM

## 2016-06-08 DIAGNOSIS — M3313 Other dermatomyositis without myopathy: Secondary | ICD-10-CM

## 2016-06-08 DIAGNOSIS — Z79899 Other long term (current) drug therapy: Secondary | ICD-10-CM | POA: Diagnosis not present

## 2016-06-08 LAB — CBC WITH DIFFERENTIAL/PLATELET
BASOS PCT: 0 %
Basophils Absolute: 0 cells/uL (ref 0–200)
EOS ABS: 140 {cells}/uL (ref 15–500)
Eosinophils Relative: 2 %
HEMATOCRIT: 46.8 % — AB (ref 35.0–45.0)
HEMOGLOBIN: 15.7 g/dL — AB (ref 11.7–15.5)
LYMPHS ABS: 2450 {cells}/uL (ref 850–3900)
LYMPHS PCT: 35 %
MCH: 30.5 pg (ref 27.0–33.0)
MCHC: 33.5 g/dL (ref 32.0–36.0)
MCV: 91.1 fL (ref 80.0–100.0)
MONO ABS: 490 {cells}/uL (ref 200–950)
MPV: 11.1 fL (ref 7.5–12.5)
Monocytes Relative: 7 %
NEUTROS ABS: 3920 {cells}/uL (ref 1500–7800)
Neutrophils Relative %: 56 %
Platelets: 182 10*3/uL (ref 140–400)
RBC: 5.14 MIL/uL — AB (ref 3.80–5.10)
RDW: 13.4 % (ref 11.0–15.0)
WBC: 7 10*3/uL (ref 3.8–10.8)

## 2016-06-08 LAB — COMPLETE METABOLIC PANEL WITH GFR
ALT: 14 U/L (ref 6–29)
AST: 13 U/L (ref 10–35)
Albumin: 4.2 g/dL (ref 3.6–5.1)
Alkaline Phosphatase: 40 U/L (ref 33–130)
BILIRUBIN TOTAL: 0.7 mg/dL (ref 0.2–1.2)
BUN: 17 mg/dL (ref 7–25)
CALCIUM: 9.9 mg/dL (ref 8.6–10.4)
CHLORIDE: 105 mmol/L (ref 98–110)
CO2: 29 mmol/L (ref 20–31)
CREATININE: 0.85 mg/dL (ref 0.50–0.99)
GFR, Est African American: 83 mL/min (ref >=60)
GFR, Est Non African American: 72 mL/min (ref >=60)
Glucose, Bld: 90 mg/dL (ref 65–99)
Potassium: 3.9 mmol/L (ref 3.5–5.3)
Sodium: 144 mmol/L (ref 135–146)
TOTAL PROTEIN: 6.7 g/dL (ref 6.1–8.1)

## 2016-06-08 LAB — CK: Total CK: 21 U/L (ref 7–177)

## 2016-06-09 NOTE — Progress Notes (Signed)
Labs normal.

## 2016-06-10 ENCOUNTER — Encounter: Payer: Self-pay | Admitting: Rheumatology

## 2016-06-10 ENCOUNTER — Ambulatory Visit (INDEPENDENT_AMBULATORY_CARE_PROVIDER_SITE_OTHER): Payer: Medicare Other | Admitting: Rheumatology

## 2016-06-10 VITALS — BP 138/78 | HR 78 | Resp 16 | Wt 156.0 lb

## 2016-06-10 DIAGNOSIS — Z87891 Personal history of nicotine dependence: Secondary | ICD-10-CM | POA: Diagnosis not present

## 2016-06-10 DIAGNOSIS — R5383 Other fatigue: Secondary | ICD-10-CM | POA: Diagnosis not present

## 2016-06-10 DIAGNOSIS — D4989 Neoplasm of unspecified behavior of other specified sites: Secondary | ICD-10-CM

## 2016-06-10 DIAGNOSIS — D150 Benign neoplasm of thymus: Secondary | ICD-10-CM

## 2016-06-10 DIAGNOSIS — Z79899 Other long term (current) drug therapy: Secondary | ICD-10-CM | POA: Diagnosis not present

## 2016-06-10 DIAGNOSIS — M3501 Sicca syndrome with keratoconjunctivitis: Secondary | ICD-10-CM

## 2016-06-10 DIAGNOSIS — M3390 Dermatopolymyositis, unspecified, organ involvement unspecified: Secondary | ICD-10-CM

## 2016-06-10 NOTE — Patient Instructions (Signed)
Methotrexate tablets What is this medicine? METHOTREXATE (METH oh TREX ate) is a chemotherapy drug used to treat cancer including breast cancer, leukemia, and lymphoma. This medicine can also be used to treat psoriasis and certain kinds of arthritis. This medicine may be used for other purposes; ask your health care provider or pharmacist if you have questions. COMMON BRAND NAME(S): Rheumatrex, Trexall What should I tell my health care provider before I take this medicine? They need to know if you have any of these conditions: -fluid in the stomach area or lungs -if you often drink alcohol -infection or immune system problems -kidney disease or on hemodialysis -liver disease -low blood counts, like low white cell, platelet, or red cell counts -lung disease -radiation therapy -stomach ulcers -ulcerative colitis -an unusual or allergic reaction to methotrexate, other medicines, foods, dyes, or preservatives -pregnant or trying to get pregnant -breast-feeding How should I use this medicine? Take this medicine by mouth with a glass of water. Follow the directions on the prescription label. Take your medicine at regular intervals. Do not take it more often than directed. Do not stop taking except on your doctor's advice. Make sure you know why you are taking this medicine and how often you should take it. If this medicine is used for a condition that is not cancer, like arthritis or psoriasis, it should be taken weekly, NOT daily. Taking this medicine more often than directed can cause serious side effects, even death. Talk to your healthcare provider about safe handling and disposal of this medicine. You may need to take special precautions. Talk to your pediatrician regarding the use of this medicine in children. While this drug may be prescribed for selected conditions, precautions do apply. Overdosage: If you think you have taken too much of this medicine contact a poison control center or  emergency room at once. NOTE: This medicine is only for you. Do not share this medicine with others. What if I miss a dose? If you miss a dose, talk with your doctor or health care professional. Do not take double or extra doses. What may interact with this medicine? This medicine may interact with the following medication: -acitretin -aspirin and aspirin-like medicines including salicylates -azathioprine -certain antibiotics like penicillins, tetracycline, and chloramphenicol -cyclosporine -gold -hydroxychloroquine -live virus vaccines -NSAIDs, medicines for pain and inflammation, like ibuprofen or naproxen -other cytotoxic agents -penicillamine -phenylbutazone -phenytoin -probenecid -retinoids such as isotretinoin and tretinoin -steroid medicines like prednisone or cortisone -sulfonamides like sulfasalazine and trimethoprim/sulfamethoxazole -theophylline This list may not describe all possible interactions. Give your health care provider a list of all the medicines, herbs, non-prescription drugs, or dietary supplements you use. Also tell them if you smoke, drink alcohol, or use illegal drugs. Some items may interact with your medicine. What should I watch for while using this medicine? Avoid alcoholic drinks. This medicine can make you more sensitive to the sun. Keep out of the sun. If you cannot avoid being in the sun, wear protective clothing and use sunscreen. Do not use sun lamps or tanning beds/booths. You may need blood work done while you are taking this medicine. Call your doctor or health care professional for advice if you get a fever, chills or sore throat, or other symptoms of a cold or flu. Do not treat yourself. This drug decreases your body's ability to fight infections. Try to avoid being around people who are sick. This medicine may increase your risk to bruise or bleed. Call your doctor or health care professional   if you notice any unusual bleeding. Check with your  doctor or health care professional if you get an attack of severe diarrhea, nausea and vomiting, or if you sweat a lot. The loss of too much body fluid can make it dangerous for you to take this medicine. Talk to your doctor about your risk of cancer. You may be more at risk for certain types of cancers if you take this medicine. Both men and women must use effective birth control with this medicine. Do not become pregnant while taking this medicine or until at least 1 normal menstrual cycle has occurred after stopping it. Women should inform their doctor if they wish to become pregnant or think they might be pregnant. Men should not father a child while taking this medicine and for 3 months after stopping it. There is a potential for serious side effects to an unborn child. Talk to your health care professional or pharmacist for more information. Do not breast-feed an infant while taking this medicine. What side effects may I notice from receiving this medicine? Side effects that you should report to your doctor or health care professional as soon as possible: -allergic reactions like skin rash, itching or hives, swelling of the face, lips, or tongue -breathing problems or shortness of breath -diarrhea -dry, nonproductive cough -low blood counts - this medicine may decrease the number of white blood cells, red blood cells and platelets. You may be at increased risk for infections and bleeding. -mouth sores -redness, blistering, peeling or loosening of the skin, including inside the mouth -signs of infection - fever or chills, cough, sore throat, pain or trouble passing urine -signs and symptoms of bleeding such as bloody or black, tarry stools; red or dark-brown urine; spitting up blood or brown material that looks like coffee grounds; red spots on the skin; unusual bruising or bleeding from the eye, gums, or nose -signs and symptoms of kidney injury like trouble passing urine or change in the amount  of urine -signs and symptoms of liver injury like dark yellow or brown urine; general ill feeling or flu-like symptoms; light-colored stools; loss of appetite; nausea; right upper belly pain; unusually weak or tired; yellowing of the eyes or skin Side effects that usually do not require medical attention (report to your doctor or health care professional if they continue or are bothersome): -dizziness -hair loss -tiredness -upset stomach -vomiting This list may not describe all possible side effects. Call your doctor for medical advice about side effects. You may report side effects to FDA at 1-800-FDA-1088. Where should I keep my medicine? Keep out of the reach of children. Store at room temperature between 20 and 25 degrees C (68 and 77 degrees F). Protect from light. Throw away any unused medicine after the expiration date. NOTE: This sheet is a summary. It may not cover all possible information. If you have questions about this medicine, talk to your doctor, pharmacist, or health care provider.  2018 Elsevier/Gold Standard (2014-11-26 05:39:22)

## 2016-06-10 NOTE — Progress Notes (Signed)
Pharmacy Note  Subjective: Patient presents today to the [facility] to see Dr. Estanislado Pandy.  Patient seen by the pharmacist for counseling on methotrexate.    Objective: CBC    Component Value Date/Time   WBC 7.0 06/08/2016 1003   RBC 5.14 (H) 06/08/2016 1003   HGB 15.7 (H) 06/08/2016 1003   HCT 46.8 (H) 06/08/2016 1003   PLT 182 06/08/2016 1003   MCV 91.1 06/08/2016 1003   MCH 30.5 06/08/2016 1003   MCHC 33.5 06/08/2016 1003   RDW 13.4 06/08/2016 1003   LYMPHSABS 2,450 06/08/2016 1003   MONOABS 490 06/08/2016 1003   EOSABS 140 06/08/2016 1003   BASOSABS 0 06/08/2016 1003   CMP     Component Value Date/Time   NA 144 06/08/2016 1003   NA 137 01/16/2016   K 3.9 06/08/2016 1003   CL 105 06/08/2016 1003   CO2 29 06/08/2016 1003   GLUCOSE 90 06/08/2016 1003   BUN 17 06/08/2016 1003   BUN 11 01/16/2016   CREATININE 0.85 06/08/2016 1003   CALCIUM 9.9 06/08/2016 1003   PROT 6.7 06/08/2016 1003   ALBUMIN 4.2 06/08/2016 1003   AST 13 06/08/2016 1003   ALT 14 06/08/2016 1003   ALKPHOS 40 06/08/2016 1003   BILITOT 0.7 06/08/2016 1003   GFRNONAA 72 06/08/2016 1003   GFRAA 83 06/08/2016 1003   TB Test: 01/03/2016 PPD negative Hepatitis panel: negative (12/04/2015) HIV: negative (01/15/2016)  Chest-xray:  Patient had CT on 01/30/2016  Contraception: post-menopausal  Assessment/Plan:  Patient was counseled on the purpose, proper use, and adverse effects of methotrexate including nausea, infection, and signs and symptoms of pneumonitis.  Reviewed instructions with patient to take methotrexate weekly along with folic acid daily.  Discussed the importance of frequent monitoring of kidney and liver function and blood counts.  Counseled patient to avoid NSAIDs and alcohol while on methotrexate.  Provided patient with educational materials on methotrexate and answered all questions.  Advised patient to get annual influenza vaccine and to get a pneumococcal vaccine if  patient has not already had one.  Patient voiced understanding.  Patient consented to methotrexate use.  Will upload into chart.   Elisabeth Most, Pharm.D., BCPS Clinical Pharmacist Pager: [phone] Phone: [phone] 06/10/2016 10:13 AM

## 2016-06-12 ENCOUNTER — Encounter: Payer: Self-pay | Admitting: Rheumatology

## 2016-06-14 ENCOUNTER — Encounter: Payer: Self-pay | Admitting: Rheumatology

## 2016-06-15 MED ORDER — HYDROXYCHLOROQUINE SULFATE 200 MG PO TABS: ORAL_TABLET | ORAL | 0 refills | Status: DC

## 2016-06-15 MED ORDER — PREDNISONE 1 MG PO TABS: ORAL_TABLET | ORAL | 0 refills | Status: DC

## 2016-06-23 DIAGNOSIS — M3390 Dermatopolymyositis, unspecified, organ involvement unspecified: Secondary | ICD-10-CM | POA: Diagnosis not present

## 2016-06-23 DIAGNOSIS — J9859 Other diseases of mediastinum, not elsewhere classified: Secondary | ICD-10-CM | POA: Diagnosis not present

## 2016-06-24 ENCOUNTER — Other Ambulatory Visit: Payer: Self-pay | Admitting: Rheumatology

## 2016-06-24 ENCOUNTER — Encounter: Payer: Self-pay | Admitting: Rheumatology

## 2016-06-24 DIAGNOSIS — M1712 Unilateral primary osteoarthritis, left knee: Secondary | ICD-10-CM | POA: Diagnosis not present

## 2016-06-24 DIAGNOSIS — R7301 Impaired fasting glucose: Secondary | ICD-10-CM | POA: Diagnosis not present

## 2016-06-24 DIAGNOSIS — E8881 Metabolic syndrome: Secondary | ICD-10-CM | POA: Diagnosis not present

## 2016-06-24 DIAGNOSIS — Z1389 Encounter for screening for other disorder: Secondary | ICD-10-CM | POA: Diagnosis not present

## 2016-06-24 DIAGNOSIS — C37 Malignant neoplasm of thymus: Secondary | ICD-10-CM | POA: Diagnosis not present

## 2016-06-24 DIAGNOSIS — K7581 Nonalcoholic steatohepatitis (NASH): Secondary | ICD-10-CM | POA: Diagnosis not present

## 2016-06-24 DIAGNOSIS — M3390 Dermatopolymyositis, unspecified, organ involvement unspecified: Secondary | ICD-10-CM | POA: Diagnosis not present

## 2016-06-24 DIAGNOSIS — E782 Mixed hyperlipidemia: Secondary | ICD-10-CM | POA: Diagnosis not present

## 2016-06-25 NOTE — Telephone Encounter (Signed)
ok 

## 2016-06-25 NOTE — Telephone Encounter (Signed)
Last visit: 06/15/16 Next Visit: 08/12/16  Okay to refill Prednisone?

## 2016-07-01 ENCOUNTER — Encounter: Payer: Self-pay | Admitting: Cardiology

## 2016-07-01 ENCOUNTER — Ambulatory Visit (INDEPENDENT_AMBULATORY_CARE_PROVIDER_SITE_OTHER): Payer: Medicare Other | Admitting: Cardiology

## 2016-07-01 VITALS — BP 120/84 | HR 77 | Ht 66.0 in | Wt 163.8 lb

## 2016-07-01 DIAGNOSIS — R002 Palpitations: Secondary | ICD-10-CM | POA: Diagnosis not present

## 2016-07-01 DIAGNOSIS — M3500 Sicca syndrome, unspecified: Secondary | ICD-10-CM

## 2016-07-01 DIAGNOSIS — M3390 Dermatopolymyositis, unspecified, organ involvement unspecified: Secondary | ICD-10-CM | POA: Diagnosis not present

## 2016-07-01 NOTE — Progress Notes (Signed)
Cardiology Office Note    Date:  07/01/2016   ID:  Marisa Orozco, DOB February 19, 1951, MRN [id number]  PCP:  Gar Ponto, MD  Cardiologist:   Candee Furbish, MD     History of Present Illness:  Marisa Orozco is a 66 y.o. female here for follow-up palpitations. She was last seen on 01/01/16 by Cecilie Kicks, NP. Prior echo showed normal ejection fraction of 65-70% with grade 1 diastolic dysfunction. Heart rate has been 110 in the past with no awareness of this. Minimal ankle edema improved off of prednisone.  She has not described any palpitations recently. 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 Overall she is doing quite well.  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 See below. Has a PET scan upcoming.   Past Medical History:  Diagnosis Date  . Allergy   . Cancer ([place])    basal cell carcinoma - face  . 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 Former smoker 02/04/2016  . GERD (gastroesophageal reflux disease)    otc med prn  . PONV (postoperative nausea and vomiting)   . Sjogren's disease ([place]) 02/04/2016   Positive ANA, Positive Ro, Positive CCP, Negative RF, Parotitis, Positive Sicca  . Sjogren's syndrome ([place])    from MD note    Past Surgical History:  Procedure Laterality Date  . BREAST SURGERY     augmentation  . BUNIONECTOMY     right foot  . COLONOSCOPY    . DIAGNOSTIC LAPAROSCOPY    . DILATION AND CURETTAGE OF UTERUS  04/15/2011   Procedure: DILATATION AND CURETTAGE;  Surgeon: [name];  Location: [facility];  Service: Gynecology;  Laterality: N/A;  . MUSCLE BIOPSY Left 01/02/2016   Procedure: MUSCLE BIOPSY LEFT DELTOID;  Surgeon: [name], MD;  Location: [facility];  Service: General;  Laterality: Left;  left deltoid muscle bx  . NASAL SEPTUM SURGERY    . SVD     x 2  . TONSILLECTOMY    .  WISDOM TOOTH EXTRACTION      Current Medications: Outpatient Medications Prior to Visit  Medication Sig Dispense Refill  . BIOTIN PO Take 8 mg by mouth daily.    Marland Kitchen BORON PO Take 30 mg by mouth.    . Calcium Citrate-Vitamin D (CALCIUM CITRATE+D3 PO) Take by mouth daily.    . Cholecalciferol (VITAMIN D3) 1000 UNIT/SPRAY LIQD Take 5 sprays by mouth daily.     . Coenzyme Q10 (COQ10) 100 MG CAPS Take by mouth every morning.    . Flaxseed Oil OIL Take 15 mLs by mouth daily.    . folic acid (FOLVITE) 1 MG tablet Take 1 tablet (1 mg total) by mouth daily. 90 tablet 3  . hydroxychloroquine (PLAQUENIL) 200 MG tablet One in the am one half in the pm 135 tablet 0  . ibuprofen (ADVIL,MOTRIN) 200 MG tablet Take 400 mg by mouth daily as needed for moderate pain.     . predniSONE (DELTASONE) 1 MG tablet 4 tablets daily (with '5mg'$ ) taper by '1mg'$  per month as directed 270 tablet 0  . predniSONE (DELTASONE) 5 MG tablet TAKE '15MG'$  (3 TABLETS) ONCE A DAY FOR ONEMONTH, THEN 12.'5MG'$  (2 AND ONE-HALF) TABLETS FOR ONE MONTH, THEN '10MG'$  (2 TABLETS) ONCE A DAY FOR ONE 225 tablet 0  . TURMERIC PO Take by mouth daily.    Marland Kitchen  VITAMIN K PO Take by mouth.    . azaTHIOprine (IMURAN) 50 MG tablet Take 50 mg QD for two weeks, then 100 mg QD for two weeks, then 150 mg QD. (Patient not taking: Reported on 06/10/2016) 222 tablet 0  . fluocinonide (LIDEX) 0.05 % external solution APPLY TO AFFECTED AREA NIGHTLY  5   No facility-administered medications prior to visit.      Allergies:   Codeine; Flagyl [metronidazole hcl]; Nexium [esomeprazole magnesium]; Imuran [azathioprine]; and Sulfa drugs cross reactors   Social History   Social History  . Marital status: Married    Spouse name: N/A  . Number of children: N/A  . Years of education: N/A   Social History Main Topics  . Smoking status: Former Smoker    Packs/day: 1.00    Years: 25.00    Types: Cigarettes    Quit date: 08/05/2003  . Smokeless tobacco: Never Used  . Alcohol  use Yes     Comment: socially - wine  . Drug use: No  . Sexual activity: Yes    Birth control/ protection: None   Other Topics Concern  . None   Social History Narrative  . None     Family History:  The patient's family history includes COPD in her mother; Diabetes in her father; Heart disease in her mother; Kidney disease in her father.   ROS:   Please see the history of present illness.    ROS All other systems reviewed and are negative.   PHYSICAL EXAM:   VS:  BP 120/84 (BP Location: Left Arm, Patient Position: Sitting, Cuff Size: Large)   Pulse 77   Ht '5\' 6"'$  (1.676 m)   Wt 163 lb 12.8 oz (74.3 kg)   SpO2 99%   BMI 26.44 kg/m    GEN: Well nourished, well developed, in no acute distress  HEENT: normal  Neck: no JVD, carotid bruits, or masses Cardiac: RRR; no murmurs, rubs, or gallops,no edema  Respiratory:  clear to auscultation bilaterally, normal work of breathing GI: soft, nontender, nondistended, + BS MS: no deformity or atrophy  Skin: warm and dry, no rash Neuro:  Alert and Oriented x 3, Strength and sensation are intact Psych: euthymic mood, full affect  Wt Readings from Last 3 Encounters:  07/01/16 163 lb 12.8 oz (74.3 kg)  06/10/16 156 lb (70.8 kg)  05/05/16 155 lb (70.3 kg)      Studies/Labs Reviewed:   EKG:  None today  Recent Labs: 02/18/2016: TSH 0.87 06/08/2016: ALT 14; BUN 17; Creat 0.85; Hemoglobin 15.7; Platelets 182; Potassium 3.9; Sodium 144   Lipid Panel No results found for: CHOL, TRIG, HDL, CHOLHDL, VLDL, LDLCALC, LDLDIRECT  Additional studies/ records that were reviewed today include:  Prior records reviewed, echo, lab work    ASSESSMENT:    1. Palpitations   2. Sjogren's syndrome, with unspecified organ involvement ([place])   3. Dermatomyositis ([place])      PLAN:  In order of problems listed above:  Sjogren's syndrome/dermatomyositis  - Stable echocardiogram previously described basal hypertrophy of the septum.  - No signs  of infiltrative disease.  - It may be reasonable to repeat echocardiogram in 5 years, or sooner if symptoms develop.  Thymus mass   - She has seen both Dr. Roxan Hockey and Dr. Maryjane Hurter at [facility].  - Currently awaiting repeat PET scan in May 2018.  - She is strongly contemplating the risks versus benefits of surgery and potentially may prefer the robotic approach described by  Dr. Maryjane Hurter if surgery is necessary. She may proceed with surveillance instead of surgery.  - I expressed my confidence in Dr. Roxan Hockey in our surgery team here at [facility] as well.  Palpitations  - They have resolved. Doing well. Tachycardia was likely related to her dermatomyositis flare. As well as swelling with prednisone increased.  Medication Adjustments/Labs and Tests Ordered: Current medicines are reviewed at length with the patient today.  Concerns regarding medicines are outlined above.  Medication changes, Labs and Tests ordered today are listed in the Patient Instructions below. Patient Instructions  Medication Instructions:  The current medical regimen is effective;  continue present plan and medications.  Follow-Up: Follow up as needed with Dr Marlou Porch.  Thank you for choosing [facility]!!        Signed, Candee Furbish, MD  07/01/2016 10:25 AM    [address], [place], [place]  [address] Phone: [phone]; Fax: [phone]

## 2016-07-01 NOTE — Patient Instructions (Signed)
Medication Instructions:  The current medical regimen is effective;  continue present plan and medications.  Follow-Up: Follow up as needed with Dr Skains.  Thank you for choosing [facility]!!     

## 2016-07-13 ENCOUNTER — Encounter: Payer: Self-pay | Admitting: Rheumatology

## 2016-07-14 DIAGNOSIS — H40053 Ocular hypertension, bilateral: Secondary | ICD-10-CM | POA: Diagnosis not present

## 2016-07-14 DIAGNOSIS — H2513 Age-related nuclear cataract, bilateral: Secondary | ICD-10-CM | POA: Diagnosis not present

## 2016-07-15 ENCOUNTER — Other Ambulatory Visit: Payer: Self-pay | Admitting: *Deleted

## 2016-07-15 DIAGNOSIS — D4989 Neoplasm of unspecified behavior of other specified sites: Secondary | ICD-10-CM

## 2016-07-15 DIAGNOSIS — D150 Benign neoplasm of thymus: Principal | ICD-10-CM

## 2016-07-27 DIAGNOSIS — Z85828 Personal history of other malignant neoplasm of skin: Secondary | ICD-10-CM | POA: Diagnosis not present

## 2016-07-27 DIAGNOSIS — M3390 Dermatopolymyositis, unspecified, organ involvement unspecified: Secondary | ICD-10-CM | POA: Diagnosis not present

## 2016-08-07 NOTE — Progress Notes (Signed)
Office Visit Note  Patient: Marisa Orozco             Date of Birth: December 06, 1950           MRN: [id number]             PCP: Caryl Bis, MD Referring: Caryl Bis, MD Visit Date: 08/12/2016 Occupation: '@GUAROCC'$ @    Subjective:   sicca  History of Present Illness: Marisa Orozco is a 66 y.o. female with history of dermatomyositis. She is on Plaquenil and prednisone taper. She's been cutting down prednisone by 1 mg every month. She is on prednisone 9 mg a day currently. She denies any recurrence of rash or muscle weakness. She also complains of Raynaud's phenomenon. She has occasional arthralgias but no joint swelling. She has some CT scan is scheduled to follow-up on thymoma later this month. She also mentioned that her intraocular pressure was higher during her last visit with Dr. Katy Fitch.  Activities of Daily Living:  Patient reports morning stiffness for 10 minutes.   Patient Denies nocturnal pain.  Difficulty dressing/grooming: Denies Difficulty climbing stairs: Denies Difficulty getting out of chair: Denies Difficulty using hands for taps, buttons, cutlery, and/or writing: Denies   Review of Systems  Constitutional: Negative for fatigue, night sweats, weight gain, weight loss and weakness.  HENT: Negative for mouth sores, trouble swallowing, trouble swallowing, mouth dryness and nose dryness.   Eyes: Negative for pain, redness, itching, visual disturbance and dryness.  Respiratory: Negative for cough, shortness of breath and difficulty breathing.   Cardiovascular: Negative for chest pain, palpitations, hypertension, irregular heartbeat and swelling in legs/feet.  Gastrointestinal: Negative for blood in stool, constipation and diarrhea.  Endocrine: Negative for increased urination.  Genitourinary: Negative for painful urination and vaginal dryness.  Musculoskeletal: Positive for arthralgias, joint pain, joint swelling, morning stiffness and muscle tenderness. Negative  for myalgias, muscle weakness and myalgias.  Skin: Positive for sensitivity to sunlight. Negative for color change, rash, hair loss, nodules/bumps, redness, skin tightness and ulcers.  Allergic/Immunologic: Negative for susceptible to infections.  Neurological: Negative for dizziness, headaches, memory loss and night sweats.  Hematological: Negative for swollen glands.  Psychiatric/Behavioral: Negative for depressed mood and sleep disturbance. The patient is not nervous/anxious.     PMFS History:  Patient Active Problem List   Diagnosis Date Noted  . High risk medication use 04/07/2016  . Sjogren's disease ([place]) 02/04/2016  . 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 Former smoker 02/04/2016  . 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 Fatigue 02/15/2012    Past Medical History:  Diagnosis Date  . Allergy   . Cancer ([place])    basal cell carcinoma - face  . 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 Former smoker 02/04/2016  . GERD (gastroesophageal reflux disease)    otc med prn  . PONV (postoperative nausea and vomiting)   . Sjogren's disease ([place]) 02/04/2016   Positive ANA, Positive Ro, Positive CCP, Negative RF, Parotitis, Positive Sicca  . Sjogren's syndrome ([place])    from MD note    Family History  Problem Relation Age of Onset  . COPD Mother   . Heart disease Mother   . Diabetes Father   . Kidney disease Father   . Colon cancer Neg Hx   . Esophageal cancer Neg Hx   . Stomach cancer Neg Hx   . Rectal cancer Neg Hx    Past Surgical History:  Procedure Laterality Date  .  BREAST SURGERY     augmentation  . BUNIONECTOMY     right foot  . COLONOSCOPY    . DIAGNOSTIC LAPAROSCOPY    . DILATION AND CURETTAGE OF UTERUS  04/15/2011   Procedure: DILATATION AND CURETTAGE;  Surgeon: [name];  Location: [facility];  Service: Gynecology;  Laterality: N/A;  . MUSCLE BIOPSY Left 01/02/2016   Procedure: MUSCLE BIOPSY LEFT DELTOID;  Surgeon: [name], MD;  Location: [facility];  Service: General;  Laterality: Left;  left deltoid muscle bx  . NASAL SEPTUM SURGERY    . SVD     x 2  . TONSILLECTOMY    . WISDOM TOOTH EXTRACTION     Social History   Social History Narrative  . No narrative on file     Objective: Vital Signs: BP 124/74   Pulse 76   Resp 14   Wt 162 lb (73.5 kg)   BMI 26.15 kg/m    Physical Exam  Constitutional: She is oriented to person, place, and time. She appears well-developed and well-nourished.  HENT:  Head: Normocephalic and atraumatic.  Eyes: Conjunctivae and EOM are normal.  Neck: Normal range of motion.  Cardiovascular: Normal rate, regular rhythm, normal heart sounds and intact distal pulses.   Pulmonary/Chest: Effort normal and breath sounds normal.  Abdominal: Soft. Bowel sounds are normal.  Lymphadenopathy:    She has no cervical adenopathy.  Neurological: She is alert and oriented to person, place, and time.  Skin: Skin is warm and dry. Capillary refill takes 2 to 3 seconds.  Psychiatric: She has a normal mood and affect. Her behavior is normal.  Nursing note and vitals reviewed.    Musculoskeletal Exam: C-spine and thoracic lumbar spine good range of motion. Shoulder joints elbow joints wrist joint MCPs PIPs DIPs with good range of motion. With no synovitis. Hip joints knee joints ankles MTPs PIPs with good range of motion with no synovitis.  CDAI Exam: No CDAI exam completed.    Investigation: Findings:  06/08/2016 CK 21  CMP Latest Ref Rng & Units 06/08/2016 05/04/2016 04/21/2016  Glucose 65 - 99 mg/dL 90 106(H) 95  BUN 7 - 25 mg/dL '17 11 17  '$ Creatinine 0.50 - 0.99 mg/dL 0.85 0.84 0.76  Sodium 135 - 146 mmol/L 144 143 144  Potassium 3.5 - 5.3 mmol/L 3.9 4.2 3.9  Chloride 98 - 110 mmol/L 105 104 105  CO2 20 - 31 mmol/L '29 28 30  '$ Calcium 8.6 - 10.4 mg/dL 9.9 9.8 9.4  Total Protein 6.1 - 8.1 g/dL 6.7 7.0 6.6  Total Bilirubin 0.2 - 1.2 mg/dL 0.7 0.7 0.5    Alkaline Phos 33 - 130 U/L 40 47 40  AST 10 - 35 U/L '13 21 12  '$ ALT 6 - 29 U/L '14 17 12   '$ CBC Latest Ref Rng & Units 06/08/2016 05/04/2016 04/21/2016  WBC 3.8 - 10.8 K/uL 7.0 9.1 8.5  Hemoglobin 11.7 - 15.5 g/dL 15.7(H) 15.8(H) 15.4  Hematocrit 35.0 - 45.0 % 46.8(H) 47.9(H) 47.5(H)  Platelets 140 - 400 K/uL 182 183 206    Imaging: No results found.  Speciality Comments: No specialty comments available.    Procedures:  No procedures performed Allergies: Codeine; Flagyl [metronidazole hcl]; Nexium [esomeprazole magnesium]; Imuran [azathioprine]; and Sulfa drugs cross reactors   Assessment / Plan:     Visit Diagnoses: Dermatomyositis ([place]): She has no muscle weakness or tenderness on examination today. She would like to check her labs every 2 months.  Per her request I will check her CK today.  Sjogren's syndrome with keratoconjunctivitis sicca ([place]): She continues to have mild sicca symptoms.  High risk medication use - Prednisone 9 mg , Plaquenil (Imuran caused arthralgias, patient declined methotrexate last visit). Her labs have been stable. She does not want any DMARD therapy besides Plaquenil.  Raynaud's disease without gangrene: Warm clothing was discussed.  Other fatigue and improved  Former smoker  Thymoma : Followed up by 2 in the CT scan soon  Increased intraocular pressure: She's been followed by Dr. Katy Fitch. Hopefully tapering dose of prednisone will be helpful.  Patient states she had a bone density at [facility] and she'll bring it next visit.  Orders: No orders of the defined types were placed in this encounter.  No orders of the defined types were placed in this encounter.   Face-to-face time spent with patient was 30 minutes. 50% of time was spent in counseling and coordination of care.  Follow-Up Instructions: Return in about 2 years (around 08/13/2018) for Dermatomyositis.   Bo Merino, MD  Note - This record has been created using Radio producer.  Chart creation errors have been sought, but may not always  have been located. Such creation errors do not reflect on  the standard of medical care.

## 2016-08-11 ENCOUNTER — Other Ambulatory Visit: Payer: Self-pay | Admitting: Radiology

## 2016-08-11 DIAGNOSIS — Z79899 Other long term (current) drug therapy: Secondary | ICD-10-CM

## 2016-08-12 ENCOUNTER — Ambulatory Visit (INDEPENDENT_AMBULATORY_CARE_PROVIDER_SITE_OTHER): Payer: Medicare Other | Admitting: Rheumatology

## 2016-08-12 ENCOUNTER — Encounter: Payer: Self-pay | Admitting: Rheumatology

## 2016-08-12 VITALS — BP 124/74 | HR 76 | Resp 14 | Wt 162.0 lb

## 2016-08-12 DIAGNOSIS — M3390 Dermatopolymyositis, unspecified, organ involvement unspecified: Secondary | ICD-10-CM | POA: Diagnosis not present

## 2016-08-12 DIAGNOSIS — R5383 Other fatigue: Secondary | ICD-10-CM | POA: Diagnosis not present

## 2016-08-12 DIAGNOSIS — Z79899 Other long term (current) drug therapy: Secondary | ICD-10-CM

## 2016-08-12 DIAGNOSIS — Z87891 Personal history of nicotine dependence: Secondary | ICD-10-CM

## 2016-08-12 DIAGNOSIS — I7300 Raynaud's syndrome without gangrene: Secondary | ICD-10-CM | POA: Diagnosis not present

## 2016-08-12 DIAGNOSIS — D150 Benign neoplasm of thymus: Secondary | ICD-10-CM

## 2016-08-12 DIAGNOSIS — M3501 Sicca syndrome with keratoconjunctivitis: Secondary | ICD-10-CM | POA: Diagnosis not present

## 2016-08-12 DIAGNOSIS — D4989 Neoplasm of unspecified behavior of other specified sites: Secondary | ICD-10-CM

## 2016-08-12 DIAGNOSIS — H40053 Ocular hypertension, bilateral: Secondary | ICD-10-CM | POA: Diagnosis not present

## 2016-08-12 NOTE — Patient Instructions (Signed)
Standing Labs We placed an order today for your standing lab work.    Please come back and get your standing labs in July and every 2 months  We have open lab Monday through Friday from 8:30-11:30 AM and 1:30-4 PM at the office of Dr. Tresa Moore, PA.   The office is located at [address] [place], [place],  [address] No appointment is necessary.   Labs are drawn by [facility].  You may receive a bill from [facility] for your lab work.

## 2016-08-13 ENCOUNTER — Encounter: Payer: Self-pay | Admitting: Rheumatology

## 2016-08-13 LAB — CK: CK TOTAL: 87 U/L (ref 7–177)

## 2016-08-13 NOTE — Progress Notes (Signed)
CK higher but WNL. No change in tt plan.

## 2016-09-01 ENCOUNTER — Other Ambulatory Visit: Payer: Self-pay

## 2016-09-01 ENCOUNTER — Ambulatory Visit
Admission: RE | Admit: 2016-09-01 | Discharge: 2016-09-01 | Disposition: A | Discharge status: Home / Self Care | Payer: Medicare Other | Source: Ambulatory Visit | Attending: Thoracic Surgery (Cardiothoracic Vascular Surgery) | Admitting: Thoracic Surgery (Cardiothoracic Vascular Surgery)

## 2016-09-01 ENCOUNTER — Encounter: Payer: Self-pay | Admitting: Thoracic Surgery (Cardiothoracic Vascular Surgery)

## 2016-09-01 ENCOUNTER — Ambulatory Visit (INDEPENDENT_AMBULATORY_CARE_PROVIDER_SITE_OTHER): Payer: Medicare Other | Admitting: Thoracic Surgery (Cardiothoracic Vascular Surgery)

## 2016-09-01 VITALS — BP 158/78 | HR 80 | Resp 20 | Ht 66.0 in | Wt 165.0 lb

## 2016-09-01 DIAGNOSIS — E039 Hypothyroidism, unspecified: Secondary | ICD-10-CM

## 2016-09-01 DIAGNOSIS — E328 Other diseases of thymus: Secondary | ICD-10-CM | POA: Diagnosis not present

## 2016-09-01 DIAGNOSIS — M3500 Sicca syndrome, unspecified: Secondary | ICD-10-CM

## 2016-09-01 DIAGNOSIS — D4989 Neoplasm of unspecified behavior of other specified sites: Secondary | ICD-10-CM

## 2016-09-01 DIAGNOSIS — D150 Benign neoplasm of thymus: Principal | ICD-10-CM

## 2016-09-01 DIAGNOSIS — K449 Diaphragmatic hernia without obstruction or gangrene: Secondary | ICD-10-CM | POA: Diagnosis not present

## 2016-09-01 LAB — TSH: TSH: 1.16 mIU/L

## 2016-09-01 NOTE — Progress Notes (Signed)
[address] FallsSuite [address]       [place] [address]             [phone]    HPI: Marisa Orozco returns today for follow-up of her anterior mediastinal mass.  Marisa Orozco is a 66 year old woman with a past medical history significant for dermatomyositis, Sjogren syndrome, chronic fatigue, and remote tobacco abuse (25 pack years, quit 2005). I saw her back in the fall for an anterior mediastinal mass. 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 Other things the differential included lymphoma or teratoma. A PET CT showed low-grade activity. She was having a great deal of problem with arthralgias, headaches, swollen glands, and itching in her eyes. She was having a flareup of dermatomyositis confirmed by muscle biopsy. Her TSH level was low but T3 and T4 levels were within normal limits.  She tried that Imuran but could not tolerate that. She currently is on prednisone. She started 20 mg daily and now is down to 8 mg a day. Her symptoms are significantly improved from an I saw her last fall but she "knows there is still something there." She has gained about 18 pounds with a prednisone.  Past Medical History:  Diagnosis Date  . Allergy   . Cancer ([place])    basal cell carcinoma - face  . Dermatomyositis ([place]) 02/04/2016   Jo 1 Positive  Skin Bx- Subtle interface Dermatitis   . Former smoker 02/04/2016  . GERD (gastroesophageal reflux disease)    otc med prn  . PONV (postoperative nausea and vomiting)   . Sjogren's disease ([place]) 02/04/2016   Positive ANA, Positive Ro, Positive CCP, Negative RF, Parotitis, Positive Sicca  . Sjogren's syndrome ([place])    from MD note    Current Outpatient Prescriptions  Medication Sig Dispense Refill  . BIOTIN PO Take 8 mg by mouth daily.    Marland Kitchen BORON PO Take 30 mg by mouth.    . Calcium Citrate-Vitamin D (CALCIUM CITRATE+D3 PO) Take by mouth daily.    . Cholecalciferol (VITAMIN D3) 1000 UNIT/SPRAY  LIQD Take 5 sprays by mouth daily.     . Coenzyme Q10 (COQ10) 100 MG CAPS Take by mouth every morning.    . Flaxseed Oil OIL Take 15 mLs by mouth daily.    . folic acid (FOLVITE) 1 MG tablet Take 1 tablet (1 mg total) by mouth daily. 90 tablet 3  . hydroxychloroquine (PLAQUENIL) 200 MG tablet One in the am one half in the pm 135 tablet 0  . ibuprofen (ADVIL,MOTRIN) 200 MG tablet Take 400 mg by mouth daily as needed for moderate pain.     . predniSONE (DELTASONE) 5 MG tablet TAKE 15MG  (3 TABLETS) ONCE A DAY FOR ONEMONTH, THEN 12.5MG  (2 AND ONE-HALF) TABLETS FOR ONE MONTH, THEN 10MG  (2 TABLETS) ONCE A DAY FOR ONE (Patient taking differently: one daily) 225 tablet 0  . TURMERIC PO Take by mouth daily.    Marland Kitchen VITAMIN K PO Take by mouth.     No current facility-administered medications for this visit.     Physical Exam BP (!) 158/78   Pulse 80   Resp 20   Ht 5\' 6"  (1.676 m)   Wt 165 lb (74.8 kg)   SpO2 93%   BMI 26.59 kg/m  66 year old woman in no acute distress Alert and oriented 3 with no focal deficits No cervical or supraclavicular adenopathy Lungs clear with equal breath  is bilaterally Cardiac regular rate and rhythm normal S1 and S2  Diagnostic Tests: CT CHEST WITHOUT CONTRAST  TECHNIQUE: Multidetector CT imaging of the chest was performed following the standard protocol without IV contrast.  COMPARISON:  Chest CT January 30, 2016; PET-CT February 26, 2016  FINDINGS: Cardiovascular: There is no thoracic aortic aneurysm. Visualized great vessels appear unremarkable. There is mild atherosclerotic calcification in the aorta. Pericardium is not appreciably thickened.  Mediastinum/Nodes: The prominence of the thymus anterior and slightly to the right of the midline of the ascending aorta is again noted. The thymic tissue currently present measures 2.1 x 1.1 cm, stable by re- measurement. There is no new prominence in the anterior mediastinum.  Thyroid appears normal.  There is no appreciable thoracic adenopathy. There is a small hiatal hernia.  Lungs/Pleura: There are scattered areas of scarring in the lung apices. There is mild patchy bibasilar atelectatic change. There is no lung parenchymal nodular lesion. No consolidation or edema. No pleural effusion or pleural thickening.  Upper Abdomen: Visualized upper abdominal structures appear unremarkable except for modest calcification in the aorta.  Musculoskeletal: There is again noted evidence of ruptured breast implant on the right with soft tissue opacity medial to the implant. There is calcification in each implant, unchanged. There are no blastic or lytic bone lesions.  IMPRESSION: The prominence of the thymus is stable. There is no change in size or contour of the spine prominence compared to prior studies. No new anterior mediastinal lesion.  No evident adenopathy. No lung edema or consolidation. Areas of mild scarring and atelectatic change as noted.  Evidence of prior rupture of breast implant on the right, stable in appearance compared to prior studies. Each breast implant contains calcification.  Fairly small hiatal hernia.  Areas of mild atherosclerotic calcification.   Electronically Signed   By: Lowella Grip III M.D.   On: 09/01/2016 13:16 I personally reviewed the CT chest and concur with the findings noted above  Impression: 66 year old woman with dermatomyositis and Sjogren's syndrome who has an anterior mediastinal mass. Differential diagnosis includes thymic hyperplasia, thymoma, or less likely lymphoma or germ cell tumor. On CT today the "mass" appeared to me to be slightly smaller than it was in October, although radiology read this is unchanged. It certainly is no larger.  Given the lack of interval growth in 6 months, this is very unlikely to be malignant. I do think it is reasonable to follow this radiographically at this time. I recommended another CT  scan in 6 months.  We again discussed the possibility that thymectomy could potentially improve some of her symptoms, but there certainly is no guarantee that it will have any beneficial effect. She still is adamant about not having surgery.  I'm going to check her TSH again just to make sure that it is not persistently low.  Plan: Check TSH level  Return in 6 months with CT chest.  Melrose Nakayama, MD [facility] [phone]

## 2016-09-02 ENCOUNTER — Telehealth: Payer: Self-pay | Admitting: *Deleted

## 2016-09-02 ENCOUNTER — Encounter: Payer: Self-pay | Admitting: Rheumatology

## 2016-09-02 NOTE — Telephone Encounter (Signed)
As requested by Dr. Roxan Hockey, I called Mrs. Sagrero and informed her of her normal TSH results that were obtained on 09/01/16.

## 2016-09-05 ENCOUNTER — Other Ambulatory Visit: Payer: Self-pay | Admitting: Rheumatology

## 2016-09-07 MED ORDER — HYDROXYCHLOROQUINE SULFATE 200 MG PO TABS: ORAL_TABLET | ORAL | 0 refills | Status: DC

## 2016-09-07 NOTE — Telephone Encounter (Signed)
Last Visit: 08/12/16 Next Visit: 10/14/16 Labs: 06/08/16 WNL PLQ Eye Exam: 01/10/16 WNL  Okay to refill PLQ?

## 2016-09-07 NOTE — Telephone Encounter (Signed)
ok 

## 2016-09-08 DIAGNOSIS — M3390 Dermatopolymyositis, unspecified, organ involvement unspecified: Secondary | ICD-10-CM | POA: Diagnosis not present

## 2016-09-08 DIAGNOSIS — J9859 Other diseases of mediastinum, not elsewhere classified: Secondary | ICD-10-CM | POA: Diagnosis not present

## 2016-09-11 ENCOUNTER — Other Ambulatory Visit: Payer: Self-pay | Admitting: Rheumatology

## 2016-09-11 NOTE — Telephone Encounter (Signed)
I called the patient today at [phone] answerLeft message on answering machineI advised her that she can call us back so we can discuss one-on-one about her concerns about the flare that she might get if she tapers down off of her prednisone.The current schedule is to decrease by 1 mg every month.I advised her to call me back on Monday and I can discuss her concerns with her.She is on Plaquenil and I did tell her that Plaquenil should be able to address her dermatomyositis but for now the prednisone tapers essential to minimize the risk from the side effects of prednisone. (Marisa Orozco also tried to contact the patient but she was unable to reach her by phone earlier in the day).

## 2016-09-11 NOTE — Telephone Encounter (Signed)
She's been cutting down prednisone by 1 mg every month. Concerned about flare   Please advise

## 2016-09-11 NOTE — Telephone Encounter (Signed)
Paient cutting back on Prednisone dose for several months now, and experiencing some pain in upper arm. Patient concerned about possible flare. Please call to advise. Question repeat labs.

## 2016-09-21 MED ORDER — PREDNISONE 1 MG PO TABS: ORAL_TABLET | ORAL | 0 refills | Status: DC

## 2016-09-21 NOTE — Telephone Encounter (Signed)
Attempted to contact the patient and left message for patient to call the office.  

## 2016-09-21 NOTE — Telephone Encounter (Signed)
Yes okay to hold off the prednisone taper

## 2016-09-21 NOTE — Addendum Note (Signed)
Addended by: Carole Binning on: 09/21/2016 02:35 PM   Modules accepted: Orders

## 2016-09-21 NOTE — Addendum Note (Signed)
Addended by: Carole Binning on: 09/21/2016 04:12 PM   Modules accepted: Orders

## 2016-09-21 NOTE — Telephone Encounter (Signed)
Patient states she had some discomfort in her wrist, fingers, elbows and upper arms. Patient was wanting to have CK levels at the time the discomfort was happening. Patient states the pain is better. Patient states she is currently on Prednisone 8 mg. Patient she is due to taper down on September 26, 2016. Patient would like to hold off on tapering until she comes in for her follow up on 10/14/16. Is it okay for her wait until her appointment to taper down to 7 mg of Prednisone?  Patient also needs refill on 1 mg Prednisone   Last Visit: 08/12/16 Next Visit: 10/14/16

## 2016-09-21 NOTE — Telephone Encounter (Signed)
Patient advised okay to hold on prednisone taper and prescription sent to the pharmacy.

## 2016-09-22 ENCOUNTER — Telehealth: Payer: Self-pay | Admitting: Rheumatology

## 2016-09-22 NOTE — Telephone Encounter (Signed)
Spoke with [employer] and clarified directions of prescription. Patient is taking a total of Prednisone 8 mg currently and tapering.

## 2016-09-22 NOTE — Telephone Encounter (Signed)
Pharmacy calling, needs taper dose on Rx given for MTX for insurance. Please advise.

## 2016-10-12 ENCOUNTER — Other Ambulatory Visit: Payer: Self-pay

## 2016-10-12 DIAGNOSIS — M3390 Dermatopolymyositis, unspecified, organ involvement unspecified: Secondary | ICD-10-CM

## 2016-10-12 DIAGNOSIS — Z79899 Other long term (current) drug therapy: Secondary | ICD-10-CM

## 2016-10-12 DIAGNOSIS — M3313 Other dermatomyositis without myopathy: Secondary | ICD-10-CM

## 2016-10-12 LAB — CBC WITH DIFFERENTIAL/PLATELET
BASOS ABS: 60 {cells}/uL (ref 0–200)
Basophils Relative: 1 %
Eosinophils Absolute: 180 cells/uL (ref 15–500)
Eosinophils Relative: 3 %
HEMATOCRIT: 45.1 % — AB (ref 35.0–45.0)
HEMOGLOBIN: 15 g/dL (ref 11.7–15.5)
LYMPHS ABS: 1980 {cells}/uL (ref 850–3900)
Lymphocytes Relative: 33 %
MCH: 30.1 pg (ref 27.0–33.0)
MCHC: 33.3 g/dL (ref 32.0–36.0)
MCV: 90.6 fL (ref 80.0–100.0)
MONO ABS: 360 {cells}/uL (ref 200–950)
MPV: 10.6 fL (ref 7.5–12.5)
Monocytes Relative: 6 %
NEUTROS PCT: 57 %
Neutro Abs: 3420 cells/uL (ref 1500–7800)
Platelets: 210 10*3/uL (ref 140–400)
RBC: 4.98 MIL/uL (ref 3.80–5.10)
RDW: 12.8 % (ref 11.0–15.0)
WBC: 6 10*3/uL (ref 3.8–10.8)

## 2016-10-13 DIAGNOSIS — S8001XA Contusion of right knee, initial encounter: Secondary | ICD-10-CM | POA: Diagnosis not present

## 2016-10-13 LAB — COMPLETE METABOLIC PANEL WITH GFR
ALBUMIN: 4.2 g/dL (ref 3.6–5.1)
ALK PHOS: 44 U/L (ref 33–130)
ALT: 13 U/L (ref 6–29)
AST: 14 U/L (ref 10–35)
BILIRUBIN TOTAL: 0.3 mg/dL (ref 0.2–1.2)
BUN: 21 mg/dL (ref 7–25)
CALCIUM: 9.5 mg/dL (ref 8.6–10.4)
CO2: 23 mmol/L (ref 20–31)
CREATININE: 0.77 mg/dL (ref 0.50–0.99)
Chloride: 107 mmol/L (ref 98–110)
GFR, Est African American: >89 mL/min (ref >=60)
GFR, Est Non African American: 81 mL/min (ref >=60)
Glucose, Bld: 100 mg/dL — ABNORMAL HIGH (ref 65–99)
POTASSIUM: 3.9 mmol/L (ref 3.5–5.3)
Sodium: 142 mmol/L (ref 135–146)
TOTAL PROTEIN: 6.8 g/dL (ref 6.1–8.1)

## 2016-10-13 LAB — CK: Total CK: 35 U/L (ref 29–143)

## 2016-10-13 NOTE — Progress Notes (Signed)
Office Visit Note  Patient: Marisa Orozco             Date of Birth: 1950/05/15           MRN: [id number]             PCP: Caryl Bis, MD Referring: Caryl Bis, MD Visit Date: 10/14/2016 Occupation: @GUAROCC @    Subjective:  Right knee pain.   History of Present Illness: Marisa Orozco is a 66 y.o. female with history of dermatomyositis Sjogren's syndrome. She states her right knee joint has been bothering her for the last 2 weeks. Yesterday she bumped her knee into some piece of furniture after that she had severe pain to the point she was unable to bear weight on her right foot. She was seen at the urgent care where she had x-ray of her knee joint which was told to be unremarkable. She took some ibuprofen overnight and the symptoms have he stopped to the extent that she's able to walk now. She still continues to have some discomfort in her hands. But no joint swelling. She denies any increased muscle weakness. She has tapered her prednisone down to 7 mg by mouth daily I  Activities of Daily Living:  Patient reports morning stiffness for5 minutes.   Patient Denies nocturnal pain.  Difficulty dressing/grooming: Denies Difficulty climbing stairs: Reports Difficulty getting out of chair: Reports Difficulty using hands for taps, buttons, cutlery, and/or writing: Denies   Review of Systems  Constitutional: Negative for fatigue, night sweats, weight gain, weight loss and weakness.  HENT: Positive for mouth dryness. Negative for mouth sores, trouble swallowing, trouble swallowing and nose dryness.   Eyes: Negative for pain, redness, visual disturbance and dryness.  Respiratory: Negative for cough, shortness of breath and difficulty breathing.   Cardiovascular: Negative for chest pain, palpitations, hypertension, irregular heartbeat and swelling in legs/feet.  Gastrointestinal: Negative for blood in stool, constipation and diarrhea.  Endocrine: Negative for increased  urination.  Genitourinary: Negative for vaginal dryness.  Musculoskeletal: Positive for arthralgias and joint pain. Negative for joint swelling, myalgias, muscle weakness, morning stiffness, muscle tenderness and myalgias.  Skin: Negative for color change, rash, hair loss, skin tightness, ulcers and sensitivity to sunlight.  Allergic/Immunologic: Negative for susceptible to infections.  Neurological: Negative for dizziness, memory loss and night sweats.  Hematological: Negative for swollen glands.  Psychiatric/Behavioral: Positive for sleep disturbance. Negative for depressed mood. The patient is not nervous/anxious.     PMFS History:  Patient Active Problem List   Diagnosis Date Noted  . High risk medication use 04/07/2016  . Sjogren's disease ([place]) 02/04/2016  . Dermatomyositis ([place]) 02/04/2016  . Former smoker 02/04/2016  . 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 Fatigue 02/15/2012    Past Medical History:  Diagnosis Date  . Allergy   . Cancer ([place])    basal cell carcinoma - face  . Dermatomyositis ([place]) 02/04/2016   Jo 1 Positive  Skin Bx- Subtle interface Dermatitis   . Former smoker 02/04/2016  . GERD (gastroesophageal reflux disease)    otc med prn  . PONV (postoperative nausea and vomiting)   . Sjogren's disease ([place]) 02/04/2016   Positive ANA, Positive Ro, Positive CCP, Negative RF, Parotitis, Positive Sicca  . Sjogren's syndrome ([place])    from MD note    Family History  Problem Relation Age of Onset  . COPD Mother   . Heart disease Mother   . Diabetes Father   . Kidney disease Father   .  Colon cancer Neg Hx   . Esophageal cancer Neg Hx   . Stomach cancer Neg Hx   . Rectal cancer Neg Hx    Past Surgical History:  Procedure Laterality Date  . BREAST SURGERY     augmentation  . BUNIONECTOMY     right foot  . COLONOSCOPY    . DIAGNOSTIC LAPAROSCOPY    . DILATION AND CURETTAGE OF UTERUS  04/15/2011   Procedure: DILATATION AND CURETTAGE;  Surgeon: [name];  Location: [facility];  Service: Gynecology;  Laterality: N/A;  . MUSCLE BIOPSY Left 01/02/2016   Procedure: MUSCLE BIOPSY LEFT DELTOID;  Surgeon: [name], MD;  Location: [facility];  Service: General;  Laterality: Left;  left deltoid muscle bx  . NASAL SEPTUM SURGERY    . SVD     x 2  . TONSILLECTOMY    . WISDOM TOOTH EXTRACTION     Social History   Social History Narrative  . No narrative on file     Objective: Vital Signs: BP 128/70   Pulse 78   Resp 14   Wt 166 lb (75.3 kg)   BMI 26.79 kg/m    Physical Exam  Constitutional: She is oriented to person, place, and time. She appears well-developed and well-nourished.  HENT:  Head: Normocephalic and atraumatic.  Eyes: Conjunctivae and EOM are normal.  Neck: Normal range of motion.  Cardiovascular: Normal rate, regular rhythm, normal heart sounds and intact distal pulses.   Pulmonary/Chest: Effort normal and breath sounds normal.  Abdominal: Soft. Bowel sounds are normal.  Lymphadenopathy:    She has no cervical adenopathy.  Neurological: She is alert and oriented to person, place, and time.  Skin: Skin is warm and dry. Capillary refill takes less than 2 seconds.  Psychiatric: She has a normal mood and affect. Her behavior is normal.  Nursing note and vitals reviewed.    Musculoskeletal Exam: C-spine and thoracic lumbar spine good range of motion. Shoulder joints elbow joints wrist joint MCPs PIPs DIPs are good range of motion. She is some thickening of PIP/DIP joints in her hands consistent with osteoarthritis. Hip joints knee joints ankles MTPs PIPs DIPs with good range of motion. She has some warmth in her right knee joint with painful range of motion.  CDAI Exam: No CDAI exam completed.    Investigation: No additional findings.  CBC    Component Value Date/Time   WBC 6.0 10/12/2016 1202   RBC 4.98 10/12/2016 1202   HGB 15.0 10/12/2016 1202   HCT 45.1 (H) 10/12/2016 1202   PLT 210 10/12/2016 1202   MCV  90.6 10/12/2016 1202   MCH 30.1 10/12/2016 1202   MCHC 33.3 10/12/2016 1202   RDW 12.8 10/12/2016 1202   LYMPHSABS 1,980 10/12/2016 1202   MONOABS 360 10/12/2016 1202   EOSABS 180 10/12/2016 1202   BASOSABS 60 10/12/2016 1202   CMP     Component Value Date/Time   NA 142 10/12/2016 1202   NA 137 01/16/2016   K 3.9 10/12/2016 1202   CL 107 10/12/2016 1202   CO2 23 10/12/2016 1202   GLUCOSE 100 (H) 10/12/2016 1202   BUN 21 10/12/2016 1202   BUN 11 01/16/2016   CREATININE 0.77 10/12/2016 1202   CALCIUM 9.5 10/12/2016 1202   PROT 6.8 10/12/2016 1202   ALBUMIN 4.2 10/12/2016 1202   AST 14 10/12/2016 1202   ALT 13 10/12/2016 1202   ALKPHOS 44 10/12/2016 1202   BILITOT 0.3 10/12/2016  [place] 10/12/2016 1202   GFRAA >89 10/12/2016 1202  CK 35 Imaging: No results found.  Speciality Comments: No specialty comments available.    Procedures:  Large Joint Inj Date/Time: 10/14/2016 11:31 AM Performed by: Bo Merino Authorized by: Bo Merino   Consent Given by:  Patient Site marked: the procedure site was marked   Timeout: prior to procedure the correct patient, procedure, and site was verified   Indications:  Pain and joint swelling Location:  Knee Site:  R knee Prep: patient was prepped and draped in usual sterile fashion   Needle Size:  27 G Needle Length:  1.5 inches Approach:  Medial Ultrasound Guidance: No   Fluoroscopic Guidance: No   Arthrogram: No   Medications:  1.5 mL lidocaine 1 %; 40 mg triamcinolone acetonide 40 MG/ML Aspiration Attempted: Yes   Aspirate amount (mL):  0 Patient tolerance:  Patient tolerated the procedure well with no immediate complications   Allergies: Codeine; Flagyl [metronidazole hcl]; Nexium [esomeprazole magnesium]; Imuran [azathioprine]; and Sulfa drugs cross reactors   Assessment / Plan:     Visit Diagnoses: Dermatomyositis ([place]) Jo 1 Positive Biopsy subtle interface dermatitis : Patient is doing well  without any flares. Her CK isn't desirable range. We had detailed discussion regarding prednisone taper. I've advised her to decrease by 1 mg every month until she reaches 5 mg.  High risk medication use - Plaquenil 200 mg by mouth every morning, 100 mg by mouth every afternoon, prednisone 7 mg by mouth daily (Imuran-arthralgias, methotrexate patient declined). Her most recent labs were normal including her CK. We will continue to monitor her labs every 2 months per her request.  Sjogren's syndrome with keratoconjunctivitis sicca ([place]): She's been using over-the-counter products which is been helpful  Acute pain of right knee: She's had increased pain in her right knee joint since yesterday. She got x-rays of her right knee joint from [facility] office which are reviewed and they were within normal limits. Except for chondromalacia patella. After different treatment options were discussed and informed consent was obtained right knee joint was prepped in sterile fashion and injected with cortisone the procedures described above. We will see response to the injection if she has ongoing symptoms she supposed to notify me.: Improved  Other fatigue  Former smoker  Thymoma: It is stable on CT scan per patient  Raised intraocular pressure of both eyes    Orders: Orders Placed This Encounter  Procedures  . Large Joint Injection/Arthrocentesis   No orders of the defined types were placed in this encounter.   Face-to-face time spent with patient was 30 minutes. 50% of time was spent in counseling and coordination of care.  Follow-Up Instructions: Return in about 2 months (around 12/15/2016) for Dermatomyositis, Sjogren's, Osteoarthritis.   Bo Merino, MD  Note - This record has been created using Editor, commissioning.  Chart creation errors have been sought, but may not always  have been located. Such creation errors do not reflect on  the standard of medical care.

## 2016-10-13 NOTE — Progress Notes (Signed)
Labs are normal.

## 2016-10-14 ENCOUNTER — Ambulatory Visit (INDEPENDENT_AMBULATORY_CARE_PROVIDER_SITE_OTHER): Payer: Medicare Other | Admitting: Rheumatology

## 2016-10-14 ENCOUNTER — Encounter: Payer: Self-pay | Admitting: Rheumatology

## 2016-10-14 VITALS — BP 128/70 | HR 78 | Resp 14 | Wt 166.0 lb

## 2016-10-14 DIAGNOSIS — H40053 Ocular hypertension, bilateral: Secondary | ICD-10-CM

## 2016-10-14 DIAGNOSIS — M3390 Dermatopolymyositis, unspecified, organ involvement unspecified: Secondary | ICD-10-CM

## 2016-10-14 DIAGNOSIS — M3501 Sicca syndrome with keratoconjunctivitis: Secondary | ICD-10-CM

## 2016-10-14 DIAGNOSIS — D4989 Neoplasm of unspecified behavior of other specified sites: Secondary | ICD-10-CM

## 2016-10-14 DIAGNOSIS — Z87891 Personal history of nicotine dependence: Secondary | ICD-10-CM

## 2016-10-14 DIAGNOSIS — R5383 Other fatigue: Secondary | ICD-10-CM

## 2016-10-14 DIAGNOSIS — M25561 Pain in right knee: Secondary | ICD-10-CM | POA: Diagnosis not present

## 2016-10-14 DIAGNOSIS — D150 Benign neoplasm of thymus: Secondary | ICD-10-CM | POA: Diagnosis not present

## 2016-10-14 DIAGNOSIS — Z79899 Other long term (current) drug therapy: Secondary | ICD-10-CM | POA: Diagnosis not present

## 2016-10-14 MED ORDER — TRIAMCINOLONE ACETONIDE 40 MG/ML IJ SUSP
40.0000 mg | INTRAMUSCULAR | Status: AC | PRN
  Administered 2016-10-14: 40 mg via INTRA_ARTICULAR

## 2016-10-14 MED ORDER — LIDOCAINE HCL 1 % IJ SOLN
1.5000 mL | INTRAMUSCULAR | Status: AC | PRN
  Administered 2016-10-14: 1.5 mL

## 2016-10-14 NOTE — Patient Instructions (Signed)
Natural anti-inflammatories  You can purchase these at [employer], [employer] or online.  . Turmeric (capsules)  . Ginger (ginger root or capsules)  . Omega 3 (Fish, flax seeds, chia seeds, walnuts, almonds)  . Tart cherry (dried or extract)   Patient should be under the care of a physician while taking these supplements. This may not be reproduced without the permission of Dr. Mariaguadalupe Fialkowski.  

## 2016-10-15 DIAGNOSIS — L814 Other melanin hyperpigmentation: Secondary | ICD-10-CM | POA: Diagnosis not present

## 2016-10-15 DIAGNOSIS — M3390 Dermatopolymyositis, unspecified, organ involvement unspecified: Secondary | ICD-10-CM | POA: Diagnosis not present

## 2016-10-15 DIAGNOSIS — D2271 Melanocytic nevi of right lower limb, including hip: Secondary | ICD-10-CM | POA: Diagnosis not present

## 2016-10-15 DIAGNOSIS — L820 Inflamed seborrheic keratosis: Secondary | ICD-10-CM | POA: Diagnosis not present

## 2016-10-15 DIAGNOSIS — Z85828 Personal history of other malignant neoplasm of skin: Secondary | ICD-10-CM | POA: Diagnosis not present

## 2016-10-15 DIAGNOSIS — D2371 Other benign neoplasm of skin of right lower limb, including hip: Secondary | ICD-10-CM | POA: Diagnosis not present

## 2016-10-21 DIAGNOSIS — C37 Malignant neoplasm of thymus: Secondary | ICD-10-CM | POA: Diagnosis not present

## 2016-10-21 DIAGNOSIS — M1990 Unspecified osteoarthritis, unspecified site: Secondary | ICD-10-CM | POA: Diagnosis not present

## 2016-10-21 DIAGNOSIS — K7581 Nonalcoholic steatohepatitis (NASH): Secondary | ICD-10-CM | POA: Diagnosis not present

## 2016-10-21 DIAGNOSIS — R030 Elevated blood-pressure reading, without diagnosis of hypertension: Secondary | ICD-10-CM | POA: Diagnosis not present

## 2016-10-21 DIAGNOSIS — E039 Hypothyroidism, unspecified: Secondary | ICD-10-CM | POA: Diagnosis not present

## 2016-10-21 DIAGNOSIS — E559 Vitamin D deficiency, unspecified: Secondary | ICD-10-CM | POA: Diagnosis not present

## 2016-10-21 DIAGNOSIS — K210 Gastro-esophageal reflux disease with esophagitis: Secondary | ICD-10-CM | POA: Diagnosis not present

## 2016-10-21 DIAGNOSIS — Z9189 Other specified personal risk factors, not elsewhere classified: Secondary | ICD-10-CM | POA: Diagnosis not present

## 2016-10-21 DIAGNOSIS — M3390 Dermatopolymyositis, unspecified, organ involvement unspecified: Secondary | ICD-10-CM | POA: Diagnosis not present

## 2016-10-21 DIAGNOSIS — E8881 Metabolic syndrome: Secondary | ICD-10-CM | POA: Diagnosis not present

## 2016-10-21 DIAGNOSIS — E119 Type 2 diabetes mellitus without complications: Secondary | ICD-10-CM | POA: Diagnosis not present

## 2016-10-21 DIAGNOSIS — E782 Mixed hyperlipidemia: Secondary | ICD-10-CM | POA: Diagnosis not present

## 2016-10-21 DIAGNOSIS — R899 Unspecified abnormal finding in specimens from other organs, systems and tissues: Secondary | ICD-10-CM | POA: Diagnosis not present

## 2016-10-28 DIAGNOSIS — Z6824 Body mass index (BMI) 24.0-24.9, adult: Secondary | ICD-10-CM | POA: Diagnosis not present

## 2016-10-28 DIAGNOSIS — R7301 Impaired fasting glucose: Secondary | ICD-10-CM | POA: Diagnosis not present

## 2016-10-28 DIAGNOSIS — E8881 Metabolic syndrome: Secondary | ICD-10-CM | POA: Diagnosis not present

## 2016-10-28 DIAGNOSIS — M1712 Unilateral primary osteoarthritis, left knee: Secondary | ICD-10-CM | POA: Diagnosis not present

## 2016-10-28 DIAGNOSIS — M3390 Dermatopolymyositis, unspecified, organ involvement unspecified: Secondary | ICD-10-CM | POA: Diagnosis not present

## 2016-10-28 DIAGNOSIS — C37 Malignant neoplasm of thymus: Secondary | ICD-10-CM | POA: Diagnosis not present

## 2016-10-28 DIAGNOSIS — K7581 Nonalcoholic steatohepatitis (NASH): Secondary | ICD-10-CM | POA: Diagnosis not present

## 2016-10-28 DIAGNOSIS — E782 Mixed hyperlipidemia: Secondary | ICD-10-CM | POA: Diagnosis not present

## 2016-11-03 DIAGNOSIS — Z124 Encounter for screening for malignant neoplasm of cervix: Secondary | ICD-10-CM | POA: Diagnosis not present

## 2016-11-03 DIAGNOSIS — Z6826 Body mass index (BMI) 26.0-26.9, adult: Secondary | ICD-10-CM | POA: Diagnosis not present

## 2016-11-26 ENCOUNTER — Other Ambulatory Visit: Payer: Self-pay | Admitting: Rheumatology

## 2016-11-27 MED ORDER — HYDROXYCHLOROQUINE SULFATE 200 MG PO TABS: ORAL_TABLET | ORAL | 0 refills | Status: DC

## 2016-11-27 NOTE — Telephone Encounter (Signed)
Last Visit: 10/14/16 Next Visit: 01/19/17 Labs: 10/12/16 WNL PLQ Eye Exam: 01/10/16 WNL  Okay to refill per Dr. Estanislado Pandy

## 2016-12-10 ENCOUNTER — Other Ambulatory Visit: Payer: Self-pay | Admitting: *Deleted

## 2016-12-10 ENCOUNTER — Other Ambulatory Visit: Payer: Self-pay | Admitting: Radiology

## 2016-12-10 ENCOUNTER — Encounter: Payer: Self-pay | Admitting: Rheumatology

## 2016-12-10 DIAGNOSIS — M3320 Polymyositis, organ involvement unspecified: Secondary | ICD-10-CM

## 2016-12-10 NOTE — Progress Notes (Signed)
Discussed patient with Dr Estanislado Pandy she is having increased joint pain, Dr D wants her to come in for CK and try to alternate 5mg  with 6mg  prednisone, since she had increased pain with taper from 6 to 5.

## 2016-12-11 ENCOUNTER — Encounter: Payer: Self-pay | Admitting: Rheumatology

## 2016-12-11 LAB — CK: CK TOTAL: 38 U/L (ref 29–143)

## 2016-12-21 ENCOUNTER — Ambulatory Visit: Payer: Medicare Other | Admitting: Rheumatology

## 2017-01-08 ENCOUNTER — Telehealth: Payer: Self-pay | Admitting: Radiology

## 2017-01-08 NOTE — Progress Notes (Deleted)
Office Visit Note  Patient: Marisa Orozco             Date of Birth: May 18, 1950           MRN: [id number]             PCP: Caryl Bis, MD Referring: Caryl Bis, MD Visit Date: 01/19/2017 Occupation: @GUAROCC @    Subjective:  No chief complaint on file.   History of Present Illness: Marisa Orozco is a 66 y.o. female ***   Activities of Daily Living:  Patient reports morning stiffness for *** {minute/hour:19697}.   Patient {ACTIONS;DENIES/REPORTS:21021675::"Denies"} nocturnal pain.  Difficulty dressing/grooming: {ACTIONS;DENIES/REPORTS:21021675::"Denies"} Difficulty climbing stairs: {ACTIONS;DENIES/REPORTS:21021675::"Denies"} Difficulty getting out of chair: {ACTIONS;DENIES/REPORTS:21021675::"Denies"} Difficulty using hands for taps, buttons, cutlery, and/or writing: {ACTIONS;DENIES/REPORTS:21021675::"Denies"}   No Rheumatology ROS completed.   PMFS History:  Patient Active Problem List   Diagnosis Date Noted  . High risk medication use 04/07/2016  . Sjogren's disease ([place]) 02/04/2016  . 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 Former smoker 02/04/2016  . 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 Fatigue 02/15/2012    Past Medical History:  Diagnosis Date  . Allergy   . Cancer ([place])    basal cell carcinoma - face  . 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 Former smoker 02/04/2016  . GERD (gastroesophageal reflux disease)    otc med prn  . PONV (postoperative nausea and vomiting)   . Sjogren's disease ([place]) 02/04/2016   Positive ANA, Positive Ro, Positive CCP, Negative RF, Parotitis, Positive Sicca  . Sjogren's syndrome ([place])    from MD note    Family History  Problem Relation Age of Onset  . COPD Mother   . Heart disease Mother   . Diabetes Father   . Kidney disease Father   . Colon cancer Neg Hx   . Esophageal cancer Neg Hx   . Stomach cancer Neg Hx   . Rectal cancer Neg Hx    Past Surgical History:  Procedure  Laterality Date  . BREAST SURGERY     augmentation  . BUNIONECTOMY     right foot  . COLONOSCOPY    . DIAGNOSTIC LAPAROSCOPY    . DILATION AND CURETTAGE OF UTERUS  04/15/2011   Procedure: DILATATION AND CURETTAGE;  Surgeon: [name];  Location: [facility];  Service: Gynecology;  Laterality: N/A;  . MUSCLE BIOPSY Left 01/02/2016   Procedure: MUSCLE BIOPSY LEFT DELTOID;  Surgeon: [name], MD;  Location: [facility];  Service: General;  Laterality: Left;  left deltoid muscle bx  . NASAL SEPTUM SURGERY    . SVD     x 2  . TONSILLECTOMY    . WISDOM TOOTH EXTRACTION     Social History   Social History Narrative  . No narrative on file     Objective: Vital Signs: There were no vitals taken for this visit.   Physical Exam   Musculoskeletal Exam: ***  CDAI Exam: No CDAI exam completed.    Investigation: No additional findings. CBC Latest Ref Rng & Units 10/12/2016 06/08/2016 05/04/2016  WBC 3.8 - 10.8 K/uL 6.0 7.0 9.1  Hemoglobin 11.7 - 15.5 g/dL 15.0 15.7(H) 15.8(H)  Hematocrit 35.0 - 45.0 % 45.1(H) 46.8(H) 47.9(H)  Platelets 140 - 400 K/uL 210 182 183   CMP Latest Ref Rng & Units 10/12/2016 06/08/2016 05/04/2016  Glucose 65 - 99 mg/dL 100(H) 90 106(H)  BUN 7 - 25 mg/dL 21 17  11  Creatinine 0.50 - 0.99 mg/dL 0.77 0.85 0.84  Sodium 135 - 146 mmol/L 142 144 143  Potassium 3.5 - 5.3 mmol/L 3.9 3.9 4.2  Chloride 98 - 110 mmol/L 107 105 104  CO2 20 - 31 mmol/L 23 29 28   Calcium 8.6 - 10.4 mg/dL 9.5 9.9 9.8  Total Protein 6.1 - 8.1 g/dL 6.8 6.7 7.0  Total Bilirubin 0.2 - 1.2 mg/dL 0.3 0.7 0.7  Alkaline Phos 33 - 130 U/L 44 40 47  AST 10 - 35 U/L 14 13 21   ALT 6 - 29 U/L 13 14 17     Imaging: No results found.  Speciality Comments: No specialty comments available.    Procedures:  No procedures performed Allergies: Codeine; Flagyl [metronidazole hcl]; Nexium [esomeprazole magnesium]; Imuran [azathioprine]; and Sulfa drugs cross reactors   Assessment /  Plan:     Visit Diagnoses: Dermatomyositis ([place])  High risk medication use - Plaquenil   On prednisone therapy  Sjogren's syndrome with keratoconjunctivitis sicca ([place])  Other fatigue  Thymoma  Former smoker    Orders: No orders of the defined types were placed in this encounter.  No orders of the defined types were placed in this encounter.   Face-to-face time spent with patient was *** minutes. 50% of time was spent in counseling and coordination of care.  Follow-Up Instructions: No Follow-up on file.   Siah Kannan, RT  Note - This record has been created using [employer].  Chart creation errors have been sought, but may not always  have been located. Such creation errors do not reflect on  the standard of medical care.

## 2017-01-12 ENCOUNTER — Other Ambulatory Visit: Payer: Self-pay

## 2017-01-12 DIAGNOSIS — Z79899 Other long term (current) drug therapy: Secondary | ICD-10-CM

## 2017-01-12 DIAGNOSIS — M3390 Dermatopolymyositis, unspecified, organ involvement unspecified: Secondary | ICD-10-CM | POA: Diagnosis not present

## 2017-01-12 NOTE — Progress Notes (Signed)
Add CK.  CK should be done with all standing orders

## 2017-01-13 ENCOUNTER — Telehealth: Payer: Self-pay | Admitting: Rheumatology

## 2017-01-13 LAB — TEST AUTHORIZATION

## 2017-01-13 LAB — CBC WITH DIFFERENTIAL/PLATELET
Basophils Absolute: 59 cells/uL (ref 0–200)
Basophils Relative: 0.8 %
EOS ABS: 207 {cells}/uL (ref 15–500)
Eosinophils Relative: 2.8 %
HEMATOCRIT: 47.1 % — AB (ref 35.0–45.0)
HEMOGLOBIN: 15.6 g/dL — AB (ref 11.7–15.5)
Lymphs Abs: 1672 cells/uL (ref 850–3900)
MCH: 29.5 pg (ref 27.0–33.0)
MCHC: 33.1 g/dL (ref 32.0–36.0)
MCV: 89.2 fL (ref 80.0–100.0)
MONOS PCT: 7.7 %
MPV: 11.3 fL (ref 7.5–12.5)
NEUTROS PCT: 66.1 %
Neutro Abs: 4891 cells/uL (ref 1500–7800)
Platelets: 187 10*3/uL (ref 140–400)
RBC: 5.28 10*6/uL — ABNORMAL HIGH (ref 3.80–5.10)
RDW: 12.5 % (ref 11.0–15.0)
TOTAL LYMPHOCYTE: 22.6 %
WBC mixed population: 570 cells/uL (ref 200–950)
WBC: 7.4 10*3/uL (ref 3.8–10.8)

## 2017-01-13 LAB — COMPLETE METABOLIC PANEL WITH GFR
AG Ratio: 1.7 (calc) (ref 1.0–2.5)
ALBUMIN MSPROF: 4.5 g/dL (ref 3.6–5.1)
ALKALINE PHOSPHATASE (APISO): 42 U/L (ref 33–130)
ALT: 14 U/L (ref 6–29)
AST: 14 U/L (ref 10–35)
BUN: 14 mg/dL (ref 7–25)
CALCIUM: 9.6 mg/dL (ref 8.6–10.4)
CO2: 30 mmol/L (ref 20–32)
CREATININE: 0.77 mg/dL (ref 0.50–0.99)
Chloride: 105 mmol/L (ref 98–110)
GFR, EST NON AFRICAN AMERICAN: 80 mL/min/{1.73_m2} (ref >=60)
GFR, Est African American: 93 mL/min/{1.73_m2} (ref >=60)
GLOBULIN: 2.6 g/dL (ref 1.9–3.7)
Glucose, Bld: 82 mg/dL (ref 65–99)
Potassium: 3.8 mmol/L (ref 3.5–5.3)
SODIUM: 142 mmol/L (ref 135–146)
Total Bilirubin: 0.8 mg/dL (ref 0.2–1.2)
Total Protein: 7.1 g/dL (ref 6.1–8.1)

## 2017-01-13 LAB — CK: CK TOTAL: 32 U/L (ref 29–143)

## 2017-01-13 NOTE — Progress Notes (Signed)
WNLs

## 2017-01-13 NOTE — Telephone Encounter (Signed)
Patient advised CK was drawn and the results were normal.

## 2017-01-13 NOTE — Progress Notes (Signed)
Office Visit Note  Patient: Marisa Orozco             Date of Birth: [birth year]           MRN: [id number]             PCP: Caryl Bis, MD Referring: Caryl Bis, MD Visit Date: 01/15/2017 Occupation: @GUAROCC @    Subjective:  Left Elbow knot and Medication Management (Prednisone alternating 4mg  with 5mg  )   History of Present Illness: Marisa Orozco is a 66 y.o. 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 She states she's been tapering her prednisone 5 mg alternating with the 4 mg. She's been also taking Plaquenil on a regular basis. She denies any increased muscle weakness or tenderness. She denies any episodes of rash. Her sicca symptoms are stable she has to take frequent sips of water. She has noticed some prominence over her left elbow.  Activities of Daily Living:  Patient reports morning stiffness for several hours better with movement .   Patient Reports nocturnal pain.  Difficulty dressing/grooming: Denies Difficulty climbing stairs: Denies Difficulty getting out of chair: Denies Difficulty using hands for taps, buttons, cutlery, and/or writing: Denies   Review of Systems  Constitutional: Negative.  Negative for fatigue, night sweats, weight gain, weight loss and weakness.  HENT: Negative.  Negative for mouth sores, trouble swallowing, trouble swallowing, mouth dryness and nose dryness.   Eyes: Negative.  Negative for pain, redness, visual disturbance and dryness.  Respiratory: Negative.  Negative for cough, shortness of breath and difficulty breathing.   Cardiovascular: Negative.  Negative for chest pain, palpitations, hypertension, irregular heartbeat and swelling in legs/feet.  Gastrointestinal: Negative.  Negative for blood in stool, constipation and diarrhea.  Endocrine: Negative.  Negative for increased urination.  Genitourinary: Negative.  Negative for vaginal dryness.  Musculoskeletal: Positive for arthralgias, joint pain, myalgias and myalgias. Negative for  joint swelling, muscle weakness, morning stiffness and muscle tenderness.  Skin: Negative.  Negative for color change, rash, hair loss, skin tightness, ulcers and sensitivity to sunlight.  Allergic/Immunologic: Negative.  Negative for susceptible to infections.  Neurological: Negative for dizziness, headaches, memory loss and night sweats.  Hematological: Negative.  Negative for swollen glands.  Psychiatric/Behavioral: Positive for sleep disturbance. Negative for depressed mood. The patient is not nervous/anxious.     PMFS History:  Patient Active Problem List   Diagnosis Date Noted  . High risk medication use 04/07/2016  . Sjogren's disease ([place]) 02/04/2016  . 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 Former smoker 02/04/2016  . 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 Fatigue 02/15/2012    Past Medical History:  Diagnosis Date  . Allergy   . Cancer ([place])    basal cell carcinoma - face  . 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 Former smoker 02/04/2016  . GERD (gastroesophageal reflux disease)    otc med prn  . PONV (postoperative nausea and vomiting)   . Sjogren's disease ([place]) 02/04/2016   Positive ANA, Positive Ro, Positive CCP, Negative RF, Parotitis, Positive Sicca  . Sjogren's syndrome ([place])    from MD note    Family History  Problem Relation Age of Onset  . COPD Mother   . Heart disease Mother   . Diabetes Father   . Kidney disease Father   . Colon cancer Neg Hx   . Esophageal cancer Neg Hx   . Stomach cancer Neg Hx   . Rectal cancer Neg Hx  Past Surgical History:  Procedure Laterality Date  . BREAST SURGERY     augmentation  . BUNIONECTOMY     right foot  . COLONOSCOPY    . DIAGNOSTIC LAPAROSCOPY    . DILATION AND CURETTAGE OF UTERUS  04/15/2011   Procedure: DILATATION AND CURETTAGE;  Surgeon: [name];  Location: [facility];  Service: Gynecology;  Laterality: N/A;  . MUSCLE BIOPSY Left 01/02/2016   Procedure: MUSCLE BIOPSY  LEFT DELTOID;  Surgeon: [name], MD;  Location: [facility];  Service: General;  Laterality: Left;  left deltoid muscle bx  . NASAL SEPTUM SURGERY    . SVD     x 2  . TONSILLECTOMY    . WISDOM TOOTH EXTRACTION     Social History   Social History Narrative  . No narrative on file     Objective: Vital Signs: BP 120/68   Pulse 74   Ht 5\' 6"  (1.676 m)   Wt 168 lb (76.2 kg)   BMI 27.12 kg/m    Physical Exam  Constitutional: She is oriented to person, place, and time. She appears well-developed and well-nourished.  HENT:  Head: Normocephalic and atraumatic.  Eyes: Conjunctivae and EOM are normal.  Neck: Normal range of motion.  Cardiovascular: Normal rate, regular rhythm, normal heart sounds and intact distal pulses.   Pulmonary/Chest: Effort normal and breath sounds normal.  Abdominal: Soft. Bowel sounds are normal.  Lymphadenopathy:    She has no cervical adenopathy.  Neurological: She is alert and oriented to person, place, and time.  Skin: Skin is warm and dry. Capillary refill takes less than 2 seconds.  A small palpable nodule was present over left elbow. It could likely be lipoma.   Psychiatric: She has a normal mood and affect. Her behavior is normal.  Nursing note and vitals reviewed.    Musculoskeletal Exam: C-spine and thoracic lumbar spine good range of motion. Shoulder joints elbow joints wrist joints are good range of motion. She has some DIP PIP thickening in her hands consistent with osteoarthritis. Hip joints knee joints ankles MTPs PIPs DIPs with good range of motion. She had no muscle weakness or tenderness on examination today.  CDAI Exam: No CDAI exam completed.    Investigation: Findings:  01/12/17 CK normal 32  CBC Latest Ref Rng & Units 01/12/2017 10/12/2016 06/08/2016  WBC 3.8 - 10.8 Thousand/uL 7.4 6.0 7.0  Hemoglobin 11.7 - 15.5 g/dL 15.6(H) 15.0 15.7(H)  Hematocrit 35.0 - 45.0 % 47.1(H) 45.1(H) 46.8(H)  Platelets 140 - 400  Thousand/uL 187 210 182   CMP Latest Ref Rng & Units 01/12/2017 10/12/2016 06/08/2016  Glucose 65 - 99 mg/dL 82 100(H) 90  BUN 7 - 25 mg/dL 14 21 17   Creatinine 0.50 - 0.99 mg/dL 0.77 0.77 0.85  Sodium 135 - 146 mmol/L 142 142 144  Potassium 3.5 - 5.3 mmol/L 3.8 3.9 3.9  Chloride 98 - 110 mmol/L 105 107 105  CO2 20 - 32 mmol/L 30 23 29   Calcium 8.6 - 10.4 mg/dL 9.6 9.5 9.9  Total Protein 6.1 - 8.1 g/dL 7.1 6.8 6.7  Total Bilirubin 0.2 - 1.2 mg/dL 0.8 0.3 0.7  Alkaline Phos 33 - 130 U/L - 44 40  AST 10 - 35 U/L 14 14 13   ALT 6 - 29 U/L 14 13 14     Imaging: No results found.  Speciality Comments: No specialty comments available.    Procedures:  No procedures performed Allergies: Codeine; Flagyl [metronidazole hcl]; Nexium [  esomeprazole magnesium]; Imuran [azathioprine]; and Sulfa drugs cross reactors   Assessment / Plan:     Visit Diagnoses: Dermatomyositis ([place]) -She had no muscle weakness or tenderness on examination. Her labs are stable and CK is normal. Plan: CK  High risk medication use - Plaquenil 200 mg twice a day Monday through Friday, prednisone 5 mg alternating with 4 mg. we will continue gradual taper.  Left elbow nodule: It could likely be lipoma. I advised that she can have it resected by dermatology if it concerns her.  Other fatigue: Doing better  Sjogren's syndrome with keratoconjunctivitis sicca ([place]): She continues to have sicca symptoms.  Former smoker  Thymoma: She will have repeat CT scan soon.  On prednisone therapy    Orders: Orders Placed This Encounter  Procedures  . CK   No orders of the defined types were placed in this encounter.   Face-to-face time spent with patient was 30 minutes. Greater than 50% of time was spent in counseling and coordination of care.   Follow-Up Instructions: Return in about 3 months (around 04/17/2017).   Bo Merino, MD  Note - This record has been created using [employer].  Chart creation errors  have been sought, but may not always  have been located. Such creation errors do not reflect on  the standard of medical care.

## 2017-01-13 NOTE — Telephone Encounter (Signed)
Patient saw some lab results posted on My Chart, and wants to make sure CK level was done. It has not posted yet, and patient wanted to make sure it will be available by Friday appt. Please call to advise.

## 2017-01-14 NOTE — Telephone Encounter (Signed)
Have sent my chart message

## 2017-01-15 ENCOUNTER — Ambulatory Visit (INDEPENDENT_AMBULATORY_CARE_PROVIDER_SITE_OTHER): Payer: Medicare Other | Admitting: Rheumatology

## 2017-01-15 ENCOUNTER — Encounter: Payer: Self-pay | Admitting: Rheumatology

## 2017-01-15 VITALS — BP 120/68 | HR 74 | Ht 66.0 in | Wt 168.0 lb

## 2017-01-15 DIAGNOSIS — Z87891 Personal history of nicotine dependence: Secondary | ICD-10-CM | POA: Diagnosis not present

## 2017-01-15 DIAGNOSIS — D150 Benign neoplasm of thymus: Secondary | ICD-10-CM

## 2017-01-15 DIAGNOSIS — D4989 Neoplasm of unspecified behavior of other specified sites: Secondary | ICD-10-CM

## 2017-01-15 DIAGNOSIS — R5383 Other fatigue: Secondary | ICD-10-CM | POA: Diagnosis not present

## 2017-01-15 DIAGNOSIS — Z79899 Other long term (current) drug therapy: Secondary | ICD-10-CM

## 2017-01-15 DIAGNOSIS — M3390 Dermatopolymyositis, unspecified, organ involvement unspecified: Secondary | ICD-10-CM | POA: Diagnosis not present

## 2017-01-15 DIAGNOSIS — Z7952 Long term (current) use of systemic steroids: Secondary | ICD-10-CM

## 2017-01-15 DIAGNOSIS — M3501 Sicca syndrome with keratoconjunctivitis: Secondary | ICD-10-CM

## 2017-01-15 NOTE — Patient Instructions (Signed)
Standing Labs We placed an order today for your standing lab work.    Please come back and get your standing labs in January and every 3 months  We have open lab Monday through Friday from 8:30-11:30 AM and 1:30-4 PM at the office of Dr. Ector Laurel.   The office is located at [address], [address], [place], [place] [address] No appointment is necessary.   Labs are drawn by [facility].  You may receive a bill from [facility] for your lab work. If you have any questions regarding directions or hours of operation,  please call [phone].    

## 2017-01-18 DIAGNOSIS — M3509 Sicca syndrome with other organ involvement: Secondary | ICD-10-CM | POA: Diagnosis not present

## 2017-01-18 DIAGNOSIS — Z79899 Other long term (current) drug therapy: Secondary | ICD-10-CM | POA: Diagnosis not present

## 2017-01-18 DIAGNOSIS — H2513 Age-related nuclear cataract, bilateral: Secondary | ICD-10-CM | POA: Diagnosis not present

## 2017-01-19 ENCOUNTER — Ambulatory Visit: Payer: Medicare Other | Admitting: Rheumatology

## 2017-01-21 DIAGNOSIS — D225 Melanocytic nevi of trunk: Secondary | ICD-10-CM | POA: Diagnosis not present

## 2017-01-21 DIAGNOSIS — L814 Other melanin hyperpigmentation: Secondary | ICD-10-CM | POA: Diagnosis not present

## 2017-01-21 DIAGNOSIS — D2272 Melanocytic nevi of left lower limb, including hip: Secondary | ICD-10-CM | POA: Diagnosis not present

## 2017-01-21 DIAGNOSIS — L821 Other seborrheic keratosis: Secondary | ICD-10-CM | POA: Diagnosis not present

## 2017-01-21 DIAGNOSIS — D2261 Melanocytic nevi of right upper limb, including shoulder: Secondary | ICD-10-CM | POA: Diagnosis not present

## 2017-01-21 DIAGNOSIS — M3390 Dermatopolymyositis, unspecified, organ involvement unspecified: Secondary | ICD-10-CM | POA: Diagnosis not present

## 2017-01-21 DIAGNOSIS — D2271 Melanocytic nevi of right lower limb, including hip: Secondary | ICD-10-CM | POA: Diagnosis not present

## 2017-01-21 DIAGNOSIS — Z85828 Personal history of other malignant neoplasm of skin: Secondary | ICD-10-CM | POA: Diagnosis not present

## 2017-01-21 DIAGNOSIS — D485 Neoplasm of uncertain behavior of skin: Secondary | ICD-10-CM | POA: Diagnosis not present

## 2017-01-21 DIAGNOSIS — D2262 Melanocytic nevi of left upper limb, including shoulder: Secondary | ICD-10-CM | POA: Diagnosis not present

## 2017-01-27 ENCOUNTER — Other Ambulatory Visit: Payer: Self-pay | Admitting: *Deleted

## 2017-01-27 DIAGNOSIS — J9859 Other diseases of mediastinum, not elsewhere classified: Secondary | ICD-10-CM

## 2017-02-15 ENCOUNTER — Telehealth: Payer: Self-pay | Admitting: Rheumatology

## 2017-02-15 NOTE — Telephone Encounter (Signed)
Her CKs are normal. She may go back to prednisone 5 mg by mouth daily and then try taper again next month.

## 2017-02-15 NOTE — Telephone Encounter (Signed)
Patient advised of recommendations. Patient verbalized understanding.

## 2017-02-15 NOTE — Telephone Encounter (Signed)
Patient called stating that she is having some issues and wanted to talk to Dr. Estanislado Pandy.  CB#[phone].

## 2017-02-15 NOTE — Telephone Encounter (Signed)
Patient states she is tapering her Prednisone. Patient states she is having increased pain in her hands and her wrists as well as her upper arms. Patient states it wakes her up when she turns over. Patient states she is having trouble making a fist. Patient states she is currently on Prednisone alternating 5 mg and 4 mg. Patient states she is not having any skin involvement. Patient is on PLQ 200 BID Monday through Friday. Patient has also took a couple of Aleve with no relief. Patient would like to know what she should do. Patient treated for Dermatomyositis. Please advise.

## 2017-02-15 NOTE — Telephone Encounter (Signed)
Attempted to contact the patient and left message for patient to call the office.  

## 2017-02-17 NOTE — Progress Notes (Signed)
Office Visit Note  Patient: Marisa Orozco             Date of Birth: Sep 08, 1950           MRN: [id number]             PCP: Caryl Bis, MD Referring: Caryl Bis, MD Visit Date: 02/18/2017 Occupation: @GUAROCC @    Subjective:  Other (BIL hand swelling/pain/stiffness, neck pain )   History of Present Illness: Marisa Orozco is a 66 y.o. female with history of dermatomyositis and Sjogren's. She states for the last few months she's been having increased pain in her joints. She has joint stiffness and swelling in her hands especially in the morning. She's been also having increased discomfort in her bilateral shoulders and deltoid muscles.She also has right TMJ pain. She reduce the dose of Plaquenil from twice a day dosing to twice a day Monday through Friday.  Activities of Daily Living:  Patient reports morning stiffness for 1 hour.   Patient Reports nocturnal pain.  Difficulty dressing/grooming: Denies Difficulty climbing stairs: Denies Difficulty getting out of chair: Denies Difficulty using hands for taps, buttons, cutlery, and/or writing: Reports   Review of Systems  Constitutional: Negative for fatigue, night sweats, weight gain, weight loss and weakness.  HENT: Negative for mouth sores, trouble swallowing, trouble swallowing and nose dryness.   Eyes: Negative for pain, redness and visual disturbance.  Respiratory: Negative for cough, shortness of breath and difficulty breathing.   Cardiovascular: Negative for chest pain, palpitations, hypertension, irregular heartbeat and swelling in legs/feet.  Gastrointestinal: Negative for blood in stool, constipation and diarrhea.  Endocrine: Negative for increased urination.  Genitourinary: Negative for vaginal dryness.  Musculoskeletal: Positive for arthralgias, joint pain and morning stiffness. Negative for joint swelling, myalgias, muscle weakness, muscle tenderness and myalgias.  Skin: Negative for color change, rash, hair  loss, skin tightness, ulcers and sensitivity to sunlight.  Allergic/Immunologic: Negative for susceptible to infections.  Neurological: Negative for dizziness, memory loss and night sweats.  Hematological: Negative for swollen glands.  Psychiatric/Behavioral: Negative for depressed mood and sleep disturbance. The patient is not nervous/anxious.     PMFS History:  Patient Active Problem List   Diagnosis Date Noted  . High risk medication use 04/07/2016  . Sjogren's disease ([place]) 02/04/2016  . Dermatomyositis ([place]) 02/04/2016  . Former smoker 02/04/2016  . 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 Fatigue 02/15/2012    Past Medical History:  Diagnosis Date  . Allergy   . Cancer ([place])    basal cell carcinoma - face  . 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 Former smoker 02/04/2016  . GERD (gastroesophageal reflux disease)    otc med prn  . PONV (postoperative nausea and vomiting)   . Sjogren's disease ([place]) 02/04/2016   Positive ANA, Positive Ro, Positive CCP, Negative RF, Parotitis, Positive Sicca  . Sjogren's syndrome ([place])    from MD note    Family History  Problem Relation Age of Onset  . COPD Mother   . Heart disease Mother   . Diabetes Father   . Kidney disease Father   . Epilepsy Daughter   . Colon cancer Neg Hx   . Esophageal cancer Neg Hx   . Stomach cancer Neg Hx   . Rectal cancer Neg Hx    Past Surgical History:  Procedure Laterality Date  . BREAST SURGERY     augmentation  . BUNIONECTOMY  right foot  . COLONOSCOPY    . DIAGNOSTIC LAPAROSCOPY    . DILATION AND CURETTAGE OF UTERUS  04/15/2011   Procedure: DILATATION AND CURETTAGE;  Surgeon: [name];  Location: [facility];  Service: Gynecology;  Laterality: N/A;  . MUSCLE BIOPSY Left 01/02/2016   Procedure: MUSCLE BIOPSY LEFT DELTOID;  Surgeon: [name], MD;  Location: [facility];  Service: General;  Laterality: Left;  left deltoid muscle bx  .  NASAL SEPTUM SURGERY    . SVD     x 2  . TONSILLECTOMY    . WISDOM TOOTH EXTRACTION     Social History   Social History Narrative  . Not on file     Objective: Vital Signs: BP 118/72 (BP Location: Left Arm, Patient Position: Sitting, Cuff Size: Normal)   Pulse 66   Resp 15   Ht 5\' 6"  (1.676 m)   Wt 165 lb (74.8 kg)   BMI 26.63 kg/m    Physical Exam  Constitutional: She is oriented to person, place, and time. She appears well-developed and well-nourished.  HENT:  Head: Normocephalic and atraumatic.  Eyes: Conjunctivae and EOM are normal.  Neck: Normal range of motion.  Cardiovascular: Normal rate, regular rhythm, normal heart sounds and intact distal pulses.  Pulmonary/Chest: Effort normal and breath sounds normal.  Abdominal: Soft. Bowel sounds are normal.  Lymphadenopathy:    She has no cervical adenopathy.  Neurological: She is alert and oriented to person, place, and time.  Skin: Skin is warm and dry. Capillary refill takes less than 2 seconds.  Psychiatric: She has a normal mood and affect. Her behavior is normal.  Nursing note and vitals reviewed.    Musculoskeletal Exam: C-spine and thoracic lumbar spine good range of motion. Shoulder joints elbow joints wrist joint MCPs PIPs DIPs with good range of motion with no synovitis. She is some discomfort range of motion of her shoulder joints. Hip joints knee joints ankles MTPs PIPs with good range of motion with no synovitis.  CDAI Exam: No CDAI exam completed.    Investigation: No additional findings. CBC Latest Ref Rng & Units 01/12/2017 10/12/2016 06/08/2016  WBC 3.8 - 10.8 Thousand/uL 7.4 6.0 7.0  Hemoglobin 11.7 - 15.5 g/dL 15.6(H) 15.0 15.7(H)  Hematocrit 35.0 - 45.0 % 47.1(H) 45.1(H) 46.8(H)  Platelets 140 - 400 Thousand/uL 187 210 182   CMP     Component Value Date/Time   NA 142 01/12/2017 1039   NA 137 01/16/2016   K 3.8 01/12/2017 1039   CL 105 01/12/2017 1039   CO2 30 01/12/2017 1039   GLUCOSE 82  01/12/2017 1039   BUN 14 01/12/2017 1039   BUN 11 01/16/2016   CREATININE 0.77 01/12/2017 1039   CALCIUM 9.6 01/12/2017 1039   PROT 7.1 01/12/2017 1039   ALBUMIN 4.2 10/12/2016 1202   AST 14 01/12/2017 1039   ALT 14 01/12/2017 1039   ALKPHOS 44 10/12/2016 1202   BILITOT 0.8 01/12/2017 1039   GFRNONAA 80 01/12/2017 1039   GFRAA 93 01/12/2017 1039   01/12/2017 CK 32 Imaging: No results found.  Speciality Comments: PLQ Eye Exam: 01/18/17 WNL @ [facility]. Follow up 1 year.    Procedures:  No procedures performed Allergies: Codeine; Flagyl [metronidazole hcl]; Nexium [esomeprazole magnesium]; Imuran [azathioprine]; Sulfa antibiotics; and Sulfa drugs cross reactors   Assessment / Plan:     Visit Diagnoses: Dermatomyositis ([place]): She has no muscular weakness or tenderness on examination. She has good muscle  strength. Her CK has been stable. I do not believe that she is having a flare of dermatomyositis. She is a still on prednisone 6 mg by mouth every morning.  Pain bilateral hands: She has no synovitis on examination. She describes pain in her hands mostly on the palmar aspect. No tenderness was noted on palpation. She believes the pain has gotten worse since we reduced her dose of Plaquenil. We will increase her dose of Plaquenil to 200 mg by mouth twice a day. I also discussed with her to taper prednisone as tolerated by 1 mg every month until she reaches 4 mg by mouth daily.  Sjogren's syndrome with keratoconjunctivitis sicca ([place]): Her sicca symptoms are better on Plaquenil.  High risk medication use - PLQ 200 mg po bid M-F , Prednisone 6 mg po q AM. Her labs are stable.  Other fatigue: Her fatigue is improved.  Patient had multiple questions regarding some of the emerging therapies for dermatomyositis which were reviewed and discussed. At this point I would not change therapy.  Thymoma  Former smoker    Orders: No orders of the defined types were placed in  this encounter.  Meds ordered this encounter  Medications  . hydroxychloroquine (PLAQUENIL) 200 MG tablet    Sig: Take 1 tablet (200 mg total) 2 (two) times daily by mouth.    Dispense:  180 tablet    Refill:  0  . predniSONE (DELTASONE) 5 MG tablet    Sig: Take 1 tab per day.    Dispense:  60 tablet    Refill:  0  . predniSONE (DELTASONE) 1 MG tablet    Sig: Take 1 tab per day with the 5 mg tab    Dispense:  30 tablet    Refill:  0  . folic acid (FOLVITE) 1 MG tablet    Sig: Take 1 tablet (1 mg total) daily by mouth.    Dispense:  90 tablet    Refill:  3      Follow-Up Instructions: Return for Dermatomyositis, Sjogren's.   Bo Merino, MD  Note - This record has been created using [employer].  Chart creation errors have been sought, but may not always  have been located. Such creation errors do not reflect on  the standard of medical care.

## 2017-02-18 ENCOUNTER — Encounter: Payer: Self-pay | Admitting: Rheumatology

## 2017-02-18 ENCOUNTER — Ambulatory Visit (INDEPENDENT_AMBULATORY_CARE_PROVIDER_SITE_OTHER): Payer: Medicare Other | Admitting: Rheumatology

## 2017-02-18 VITALS — BP 118/72 | HR 66 | Resp 15 | Ht 66.0 in | Wt 165.0 lb

## 2017-02-18 DIAGNOSIS — M3501 Sicca syndrome with keratoconjunctivitis: Secondary | ICD-10-CM

## 2017-02-18 DIAGNOSIS — Z87891 Personal history of nicotine dependence: Secondary | ICD-10-CM

## 2017-02-18 DIAGNOSIS — D150 Benign neoplasm of thymus: Secondary | ICD-10-CM

## 2017-02-18 DIAGNOSIS — M3390 Dermatopolymyositis, unspecified, organ involvement unspecified: Secondary | ICD-10-CM

## 2017-02-18 DIAGNOSIS — R5383 Other fatigue: Secondary | ICD-10-CM | POA: Diagnosis not present

## 2017-02-18 DIAGNOSIS — Z79899 Other long term (current) drug therapy: Secondary | ICD-10-CM

## 2017-02-18 DIAGNOSIS — D4989 Neoplasm of unspecified behavior of other specified sites: Secondary | ICD-10-CM

## 2017-02-18 MED ORDER — PREDNISONE 1 MG PO TABS: ORAL_TABLET | ORAL | 0 refills | Status: DC

## 2017-02-18 MED ORDER — PREDNISONE 5 MG PO TABS: ORAL_TABLET | ORAL | 0 refills | Status: DC

## 2017-02-18 MED ORDER — FOLIC ACID 1 MG PO TABS: 1.0000 mg | ORAL_TABLET | Freq: Every day | ORAL | 3 refills | Status: DC

## 2017-02-18 MED ORDER — HYDROXYCHLOROQUINE SULFATE 200 MG PO TABS: 200.0000 mg | ORAL_TABLET | Freq: Two times a day (BID) | ORAL | 0 refills | Status: DC

## 2017-02-23 ENCOUNTER — Other Ambulatory Visit: Payer: Self-pay

## 2017-02-23 ENCOUNTER — Ambulatory Visit
Admission: RE | Admit: 2017-02-23 | Discharge: 2017-02-23 | Disposition: A | Discharge status: Home / Self Care | Payer: Medicare Other | Source: Ambulatory Visit | Attending: Thoracic Surgery (Cardiothoracic Vascular Surgery) | Admitting: Thoracic Surgery (Cardiothoracic Vascular Surgery)

## 2017-02-23 ENCOUNTER — Encounter: Payer: Self-pay | Admitting: Thoracic Surgery (Cardiothoracic Vascular Surgery)

## 2017-02-23 ENCOUNTER — Ambulatory Visit (INDEPENDENT_AMBULATORY_CARE_PROVIDER_SITE_OTHER): Payer: Medicare Other | Admitting: Thoracic Surgery (Cardiothoracic Vascular Surgery)

## 2017-02-23 VITALS — BP 148/81 | HR 85 | Resp 16 | Ht 66.0 in | Wt 165.0 lb

## 2017-02-23 DIAGNOSIS — J9859 Other diseases of mediastinum, not elsewhere classified: Secondary | ICD-10-CM | POA: Diagnosis not present

## 2017-02-23 DIAGNOSIS — R918 Other nonspecific abnormal finding of lung field: Secondary | ICD-10-CM | POA: Diagnosis not present

## 2017-02-23 NOTE — Progress Notes (Signed)
[address]       [place] [address]             [phone]    HPI: Marisa Orozco returns for a scheduled follow-up visit  She is a 66 year old woman with a history of Sjogren's syndrome, dermatomyositis, remote tobacco abuse, and chronic fatigue.  I first saw her in 2017 for an anterior mediastinal mass.  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  A PET/CT showed low-grade activity.  She was offered the option of radiographic follow-up versus surgical resection.  She did not want to have surgery.  I saw her again in May of this year.  There was no change in the anterior mediastinal mass.  She still refused surgery.  In the interim since her last visit she has been adjusting doses of her prednisone and Plaquenil.  Her Plaquenil level got low and she immediately began having.  Currently is on 6 mg of prednisone daily. Past Medical History:  Diagnosis Date  . Allergy   . Cancer ([place])    basal cell carcinoma - face  . Dermatomyositis ([place]) 02/04/2016   Jo 1 Positive  Skin Bx- Subtle interface Dermatitis   . Former smoker 02/04/2016  . GERD (gastroesophageal reflux disease)    otc med prn  . PONV (postoperative nausea and vomiting)   . Sjogren's disease ([place]) 02/04/2016   Positive ANA, Positive Ro, Positive CCP, Negative RF, Parotitis, Positive Sicca  . Sjogren's syndrome ([place])    from MD note    Current Outpatient Medications  Medication Sig Dispense Refill  . BIOTIN PO Take 8 mg by mouth daily.    Marland Kitchen BORON PO Take 30 mg by mouth.    . Calcium Citrate-Vitamin D (CALCIUM CITRATE+D3 PO) Take by mouth daily.    . Cholecalciferol (VITAMIN D3) 1000 UNIT/SPRAY LIQD Take 5 sprays by mouth daily.     . Coenzyme Q10 (COQ10) 100 MG CAPS Take by mouth every morning.    . Flaxseed Oil OIL Take 15 mLs by mouth daily.    . folic acid (FOLVITE) 1 MG tablet Take 1 tablet (1 mg total) daily by mouth. 90 tablet 3  . Ginger, Zingiber officinalis, (GINGER ROOT PO) Take by mouth.     . hydroxychloroquine (PLAQUENIL) 200 MG tablet Take 1 tablet (200 mg total) 2 (two) times daily by mouth. 180 tablet 0  . ibuprofen (ADVIL,MOTRIN) 200 MG tablet Take 400 mg by mouth daily as needed for moderate pain.     Marland Kitchen MAGNESIUM MALATE PO Take by mouth.    . Omega-3 Fatty Acids (FISH OIL) 1000 MG CAPS Take by mouth.    . predniSONE (DELTASONE) 1 MG tablet Take 1 tab per day with the 5 mg tab 30 tablet 0  . predniSONE (DELTASONE) 5 MG tablet Take 1 tab per day. 60 tablet 0  . TURMERIC PO Take by mouth daily.    Marland Kitchen VITAMIN K PO Take by mouth.     No current facility-administered medications for this visit.     Physical Exam BP (!) 148/81 (BP Location: Left Arm, Patient Position: Sitting, Cuff Size: Large)   Pulse 85   Resp 16   Ht 5\' 6"  (1.676 m)   Wt 165 lb (74.8 kg)   SpO2 98% Comment: ON RA  BMI 26.78 kg/m  66 year old woman in no acute distress Alert and oriented x3 with no focal deficits Lungs clear with equal breath sounds bilaterally Cardiac regular rate and  rhythm normal S1-S2 No cervical or supraclavicular adenopathy  Diagnostic Tests: CT CHEST WITHOUT CONTRAST  TECHNIQUE: Multidetector CT imaging of the chest was performed following the standard protocol without IV contrast.  COMPARISON:  08/12/2016  FINDINGS: Cardiovascular: The heart is normal in size. No pericardial effusion.  No evidence thoracic aortic aneurysm. Mild atherosclerotic calcifications of the aortic arch.  Mediastinum/Nodes: No suspicious mediastinal lymphadenopathy.  Visualized thyroid is unremarkable.  Stable 10 x 24 mm right anterior mediastinal mass (series 3/image 50).  Lungs/Pleura: Mild biapical pleural-parenchymal scarring.  Mild tree-in-bud nodularity/subpleural reticulation in the bilateral lower lobes.  No focal consolidation.  No pleural effusion or pneumothorax.  Upper Abdomen: Visualized upper abdomen is unremarkable.  Musculoskeletal: Bilateral  breast augmentation.  Mild degenerative changes of the visualized thoracolumbar spine.  IMPRESSION: Stable 10 x 24 mm right anterior mediastinal mass, unchanged. This appearance is nonspecific but stability (and lack of hypermetabolism on prior PET) favors a benign etiology such as thymoma.  Aortic Atherosclerosis (ICD10-I70.0).   Electronically Signed   By: Julian Hy M.D.   On: 02/23/2017 12:13 I personally reviewed the CT chest and concur with the findings noted above  Impression: This is prior 66 year old woman with dermatomyositis and Sjogren's syndrome who has a small anterior mediastinal mass.  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  The area is unchanged over the past year.  We once again discussed continued radiographic follow-up versus surgical resection.  Certainly there is nothing compelling for surgical resection at this point with her being no change over the past year.  She was reminded that there is a possibility that some of her other symptoms might improve with thymectomy, but certainly no guarantee.  He wishes to continue with radiographic follow-up.  Plan:  Return in 6 months with CT chest  Melrose Nakayama, MD [facility] [phone]

## 2017-03-09 ENCOUNTER — Telehealth: Payer: Self-pay | Admitting: Rheumatology

## 2017-03-09 NOTE — Telephone Encounter (Signed)
Patient calling in reference to rt wrist pain. Would like to discuss further tx options. Patient experiencing a lot of pain. Please call to discuss.

## 2017-03-12 NOTE — Telephone Encounter (Signed)
Patient states she was having sever pain in her right wrist and right arm. Unable to bend her wrist and painful to the touch. Patient states she has been using Aspercreme, Tylenol and PLQ. Patient states she had tapered down to Prednisone 4 mg and then the symptoms started. Patient states she increased to Prednisone 6 mg for the last 4 days. Patient states her pain has went away. Patient states every time she gets down to Prednisone 4 mg she flares. Please advise.

## 2017-03-12 NOTE — Telephone Encounter (Signed)
Ok do stay on prednisone 6 minute gram by mouth daily

## 2017-03-12 NOTE — Telephone Encounter (Signed)
Patient advised okay to stay on Prednisone 6 mg.

## 2017-03-19 DIAGNOSIS — E119 Type 2 diabetes mellitus without complications: Secondary | ICD-10-CM | POA: Diagnosis not present

## 2017-03-19 DIAGNOSIS — K7581 Nonalcoholic steatohepatitis (NASH): Secondary | ICD-10-CM | POA: Diagnosis not present

## 2017-03-19 DIAGNOSIS — K210 Gastro-esophageal reflux disease with esophagitis: Secondary | ICD-10-CM | POA: Diagnosis not present

## 2017-03-19 DIAGNOSIS — Z9189 Other specified personal risk factors, not elsewhere classified: Secondary | ICD-10-CM | POA: Diagnosis not present

## 2017-03-19 DIAGNOSIS — E782 Mixed hyperlipidemia: Secondary | ICD-10-CM | POA: Diagnosis not present

## 2017-03-19 DIAGNOSIS — M1990 Unspecified osteoarthritis, unspecified site: Secondary | ICD-10-CM | POA: Diagnosis not present

## 2017-03-19 DIAGNOSIS — C37 Malignant neoplasm of thymus: Secondary | ICD-10-CM | POA: Diagnosis not present

## 2017-03-19 DIAGNOSIS — E039 Hypothyroidism, unspecified: Secondary | ICD-10-CM | POA: Diagnosis not present

## 2017-03-19 DIAGNOSIS — E8881 Metabolic syndrome: Secondary | ICD-10-CM | POA: Diagnosis not present

## 2017-03-23 DIAGNOSIS — Z9189 Other specified personal risk factors, not elsewhere classified: Secondary | ICD-10-CM | POA: Diagnosis not present

## 2017-03-23 DIAGNOSIS — Z6827 Body mass index (BMI) 27.0-27.9, adult: Secondary | ICD-10-CM | POA: Diagnosis not present

## 2017-03-23 DIAGNOSIS — M3390 Dermatopolymyositis, unspecified, organ involvement unspecified: Secondary | ICD-10-CM | POA: Diagnosis not present

## 2017-03-23 DIAGNOSIS — Z0001 Encounter for general adult medical examination with abnormal findings: Secondary | ICD-10-CM | POA: Diagnosis not present

## 2017-03-23 DIAGNOSIS — K7581 Nonalcoholic steatohepatitis (NASH): Secondary | ICD-10-CM | POA: Diagnosis not present

## 2017-03-23 DIAGNOSIS — M1712 Unilateral primary osteoarthritis, left knee: Secondary | ICD-10-CM | POA: Diagnosis not present

## 2017-03-23 DIAGNOSIS — E782 Mixed hyperlipidemia: Secondary | ICD-10-CM | POA: Diagnosis not present

## 2017-03-23 DIAGNOSIS — C37 Malignant neoplasm of thymus: Secondary | ICD-10-CM | POA: Diagnosis not present

## 2017-03-23 DIAGNOSIS — Z23 Encounter for immunization: Secondary | ICD-10-CM | POA: Diagnosis not present

## 2017-03-23 DIAGNOSIS — R7301 Impaired fasting glucose: Secondary | ICD-10-CM | POA: Diagnosis not present

## 2017-03-23 DIAGNOSIS — E8881 Metabolic syndrome: Secondary | ICD-10-CM | POA: Diagnosis not present

## 2017-04-02 NOTE — Progress Notes (Signed)
Office Visit Note  Patient: Marisa Orozco             Date of Birth: 02-23-1951           MRN: [id number]             PCP: Caryl Bis, MD Referring: Caryl Bis, MD Visit Date: 04/14/2017 Occupation: @GUAROCC @    Subjective:  Other (BIL hand/ arm pain, swelling, numbess)   History of Present Illness: Marisa Orozco is a 66 y.o. female with history of dermatomyositis and Sjogren's. She states she's been having numbness in her bilateral hands as before. She has seen her PCP and has been using carpal tunnel braces. She has not noticed any improvement in her numbness. She changed her Plaquenil dosing from 200 mg twice a day Monday to Friday to 200 mg in the morning and 100 mg in the p.m. There was a misunderstanding off changing it to 200 mg twice a day Monday through Sunday. She denies any rash. She denies any muscle weakness.  Activities of Daily Living:  Patient reports morning stiffness for 0 minute.   Patient Denies nocturnal pain.  Difficulty dressing/grooming: Denies Difficulty climbing stairs: Denies Difficulty getting out of chair: Denies Difficulty using hands for taps, buttons, cutlery, and/or writing: Denies   Review of Systems  Constitutional: Negative for fatigue, night sweats, weight gain, weight loss and weakness.  HENT: Negative for mouth sores, trouble swallowing, trouble swallowing, mouth dryness and nose dryness.   Eyes: Negative for pain, redness, visual disturbance and dryness.  Respiratory: Negative for cough, shortness of breath and difficulty breathing.   Cardiovascular: Negative for chest pain, palpitations, hypertension, irregular heartbeat and swelling in legs/feet.  Gastrointestinal: Negative for blood in stool, constipation and diarrhea.  Endocrine: Negative for increased urination.  Genitourinary: Negative for vaginal dryness.  Musculoskeletal: Positive for arthralgias and joint pain. Negative for joint swelling, myalgias, muscle weakness,  morning stiffness, muscle tenderness and myalgias.  Skin: Negative for color change, rash, hair loss, skin tightness, ulcers and sensitivity to sunlight.  Allergic/Immunologic: Negative for susceptible to infections.  Neurological: Positive for parasthesias. Negative for dizziness, numbness, memory loss and night sweats.  Hematological: Negative for swollen glands.  Psychiatric/Behavioral: Positive for sleep disturbance. Negative for depressed mood. The patient is not nervous/anxious.     PMFS History:  Patient Active Problem List   Diagnosis Date Noted  . High risk medication use 04/07/2016  . Sjogren's disease ([place]) 02/04/2016  . Dermatomyositis ([place]) 02/04/2016  . Former smoker 02/04/2016  . 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 Fatigue 02/15/2012    Past Medical History:  Diagnosis Date  . Allergy   . Cancer ([place])    basal cell carcinoma - face  . Dermatomyositis ([place]) 02/04/2016   Jo 1 Positive  Skin Bx- Subtle interface Dermatitis   . Former smoker 02/04/2016  . GERD (gastroesophageal reflux disease)    otc med prn  . PONV (postoperative nausea and vomiting)   . Sjogren's disease ([place]) 02/04/2016   Positive ANA, Positive Ro, Positive CCP, Negative RF, Parotitis, Positive Sicca  . Sjogren's syndrome ([place])    from MD note    Family History  Problem Relation Age of Onset  . COPD Mother   . Heart disease Mother   . Diabetes Father   . Kidney disease Father   . Epilepsy Daughter   . Colon cancer Neg Hx   . Esophageal cancer Neg Hx   . Stomach cancer Neg Hx   .  Rectal cancer Neg Hx    Past Surgical History:  Procedure Laterality Date  . BREAST SURGERY     augmentation  . BUNIONECTOMY     right foot  . COLONOSCOPY    . DIAGNOSTIC LAPAROSCOPY    . DILATION AND CURETTAGE OF UTERUS  04/15/2011   Procedure: DILATATION AND CURETTAGE;  Surgeon: [name];  Location: [facility];  Service: Gynecology;  Laterality: N/A;  . MUSCLE BIOPSY Left 01/02/2016   Procedure: MUSCLE BIOPSY LEFT  DELTOID;  Surgeon: [name], MD;  Location: [facility];  Service: General;  Laterality: Left;  left deltoid muscle bx  . NASAL SEPTUM SURGERY    . SVD     x 2  . TONSILLECTOMY    . WISDOM TOOTH EXTRACTION     Social History   Social History Narrative  . Not on file     Objective: Vital Signs: BP 118/74 (BP Location: Left Arm, Patient Position: Sitting, Cuff Size: Normal)   Pulse 62   Resp 16   Ht 5' 6.5" (1.689 m)   Wt 171 lb (77.6 kg)   BMI 27.19 kg/m    Physical Exam  Constitutional: She is oriented to person, place, and time. She appears well-developed and well-nourished.  HENT:  Head: Normocephalic and atraumatic.  Eyes: Conjunctivae and EOM are normal.  Neck: Normal range of motion.  Cardiovascular: Normal rate, regular rhythm, normal heart sounds and intact distal pulses.  Pulmonary/Chest: Effort normal and breath sounds normal.  Abdominal: Soft. Bowel sounds are normal.  Lymphadenopathy:    She has no cervical adenopathy.  Neurological: She is alert and oriented to person, place, and time.  Skin: Skin is warm and dry. Capillary refill takes less than 2 seconds.  Psychiatric: She has a normal mood and affect. Her behavior is normal.  Nursing note and vitals reviewed.    Musculoskeletal Exam: C-spine and thoracic lumbar spine good range of motion. Shoulder joints elbow joints wrist joint MCPs PIPs DIPs with good range of motion. Hip joints knee joints ankles MTPs PIPs DIPs are good range of motion with no synovitis. She had no muscular weakness or tenderness.  CDAI Exam: No CDAI exam completed.    Investigation: No additional findings. PLQ Eye Exam: 01/18/17  CBC Latest Ref Rng & Units 04/12/2017 01/12/2017 10/12/2016  WBC 3.8 - 10.8 Thousand/uL 8.4 7.4 6.0  Hemoglobin 11.7 - 15.5 g/dL 14.3 15.6(H) 15.0  Hematocrit 35.0 - 45.0 % 42.5 47.1(H) 45.1(H)  Platelets 140 - 400 Thousand/uL 215 187 210   CMP Latest Ref Rng & Units 04/12/2017 01/12/2017  10/12/2016  Glucose 65 - 99 mg/dL 95 82 100(H)  BUN 7 - 25 mg/dL 16 14 21   Creatinine 0.50 - 0.99 mg/dL 0.72 0.77 0.77  Sodium 135 - 146 mmol/L 141 142 142  Potassium 3.5 - 5.3 mmol/L 4.1 3.8 3.9  Chloride 98 - 110 mmol/L 106 105 107  CO2 20 - 32 mmol/L 27 30 23   Calcium 8.6 - 10.4 mg/dL 9.6 9.6 9.5  Total Protein 6.1 - 8.1 g/dL 7.0 7.1 6.8  Total Bilirubin 0.2 - 1.2 mg/dL 0.6 0.8 0.3  Alkaline Phos 33 - 130 U/L - - 44  AST 10 - 35 U/L 15 14 14   ALT 6 - 29 U/L 14 14 13   CK63  Imaging: No results found.  Speciality Comments: PLQ Eye Exam: 01/18/17 WNL @ [facility]. Follow up 1 year.    Procedures:  No procedures performed Allergies:  Codeine; Flagyl [metronidazole hcl]; Nexium [esomeprazole magnesium]; Imuran [azathioprine]; Sulfa antibiotics; and Sulfa drugs cross reactors   Assessment / Plan:     Visit Diagnoses: Dermatomyositis ([place]): She has no muscular weakness or tenderness on examination her CKs are normal. She has been tolerating Plaquenil well. Due to misunderstanding during the last visit she's been taking Plaquenil 300 mg a day instead of 400 mg a day. We had detailed discussion regarding that today. At this point I do not see any need to increase her dose. As she is doing well on 300 mg a day she can stay on the current dose. We will continue to taper her prednisone as a scheduled. It is rising her CK and we can increase her dose of Plaquenil.  Sjogren's syndrome with keratoconjunctivitis sicca ([place]): She continues to have some sicca symptoms.  Paresthesia of both hands: I'll schedule nerve conduction velocity lateral hands to rule out carpal tunnel syndrome. She's been using carpal tunnel braces and taking B12 without any results. She has also added naltrexone given by her PCP.  High risk medication use - PLQ 200 mg AM and 100 mg q PM, prednisone 5 mg po qdPLQ Eye Exam: 01/18/17  - Plan: CBC with Differential/Platelet, COMPLETE METABOLIC PANEL WITH GFR, CK  will be checked every 3 months.   Thymoma  Former smoker  Other fatigue    Orders: Orders Placed This Encounter  Procedures  . CBC with Differential/Platelet  . COMPLETE METABOLIC PANEL WITH GFR  . CK   No orders of the defined types were placed in this encounter.   Face-to-face time spent with patient was 30 minutes. Greater than 50% of time was spent in counseling and coordination of care.  Follow-Up Instructions: Return in about 3 months (around 07/13/2017) for Dermatomyositis, Sjogren's.   Bo Merino, MD  Note - This record has been created using Editor, commissioning.  Chart creation errors have been sought, but may not always  have been located. Such creation errors do not reflect on  the standard of medical care.

## 2017-04-12 ENCOUNTER — Other Ambulatory Visit: Payer: Self-pay

## 2017-04-12 DIAGNOSIS — M3390 Dermatopolymyositis, unspecified, organ involvement unspecified: Secondary | ICD-10-CM

## 2017-04-12 DIAGNOSIS — Z79899 Other long term (current) drug therapy: Secondary | ICD-10-CM

## 2017-04-12 LAB — CBC WITH DIFFERENTIAL/PLATELET
BASOS PCT: 0.8 %
Basophils Absolute: 67 cells/uL (ref 0–200)
EOS PCT: 3.9 %
Eosinophils Absolute: 328 cells/uL (ref 15–500)
HCT: 42.5 % (ref 35.0–45.0)
HEMOGLOBIN: 14.3 g/dL (ref 11.7–15.5)
Lymphs Abs: 1546 cells/uL (ref 850–3900)
MCH: 29.1 pg (ref 27.0–33.0)
MCHC: 33.6 g/dL (ref 32.0–36.0)
MCV: 86.6 fL (ref 80.0–100.0)
MONOS PCT: 8.7 %
MPV: 10.8 fL (ref 7.5–12.5)
NEUTROS ABS: 5729 {cells}/uL (ref 1500–7800)
Neutrophils Relative %: 68.2 %
PLATELETS: 215 10*3/uL (ref 140–400)
RBC: 4.91 10*6/uL (ref 3.80–5.10)
RDW: 12.3 % (ref 11.0–15.0)
Total Lymphocyte: 18.4 %
WBC mixed population: 731 cells/uL (ref 200–950)
WBC: 8.4 10*3/uL (ref 3.8–10.8)

## 2017-04-12 LAB — COMPLETE METABOLIC PANEL WITH GFR
AG RATIO: 1.5 (calc) (ref 1.0–2.5)
ALKALINE PHOSPHATASE (APISO): 51 U/L (ref 33–130)
ALT: 14 U/L (ref 6–29)
AST: 15 U/L (ref 10–35)
Albumin: 4.2 g/dL (ref 3.6–5.1)
BILIRUBIN TOTAL: 0.6 mg/dL (ref 0.2–1.2)
BUN: 16 mg/dL (ref 7–25)
CALCIUM: 9.6 mg/dL (ref 8.6–10.4)
CHLORIDE: 106 mmol/L (ref 98–110)
CO2: 27 mmol/L (ref 20–32)
Creat: 0.72 mg/dL (ref 0.50–0.99)
GFR, Est African American: 101 mL/min/{1.73_m2} (ref >=60)
GFR, Est Non African American: 87 mL/min/{1.73_m2} (ref >=60)
Globulin: 2.8 g/dL (calc) (ref 1.9–3.7)
Glucose, Bld: 95 mg/dL (ref 65–99)
POTASSIUM: 4.1 mmol/L (ref 3.5–5.3)
Sodium: 141 mmol/L (ref 135–146)
Total Protein: 7 g/dL (ref 6.1–8.1)

## 2017-04-12 LAB — CK: Total CK: 63 U/L (ref 29–143)

## 2017-04-14 ENCOUNTER — Ambulatory Visit (INDEPENDENT_AMBULATORY_CARE_PROVIDER_SITE_OTHER): Payer: Medicare Other | Admitting: Rheumatology

## 2017-04-14 ENCOUNTER — Other Ambulatory Visit (INDEPENDENT_AMBULATORY_CARE_PROVIDER_SITE_OTHER): Payer: Self-pay | Admitting: *Deleted

## 2017-04-14 ENCOUNTER — Encounter: Payer: Self-pay | Admitting: Rheumatology

## 2017-04-14 VITALS — BP 118/74 | HR 62 | Resp 16 | Ht 66.5 in | Wt 171.0 lb

## 2017-04-14 DIAGNOSIS — Z79899 Other long term (current) drug therapy: Secondary | ICD-10-CM | POA: Diagnosis not present

## 2017-04-14 DIAGNOSIS — M3501 Sicca syndrome with keratoconjunctivitis: Secondary | ICD-10-CM | POA: Diagnosis not present

## 2017-04-14 DIAGNOSIS — D150 Benign neoplasm of thymus: Secondary | ICD-10-CM | POA: Diagnosis not present

## 2017-04-14 DIAGNOSIS — Z87891 Personal history of nicotine dependence: Secondary | ICD-10-CM | POA: Diagnosis not present

## 2017-04-14 DIAGNOSIS — R202 Paresthesia of skin: Secondary | ICD-10-CM | POA: Diagnosis not present

## 2017-04-14 DIAGNOSIS — M3390 Dermatopolymyositis, unspecified, organ involvement unspecified: Secondary | ICD-10-CM | POA: Diagnosis not present

## 2017-04-14 DIAGNOSIS — M25532 Pain in left wrist: Principal | ICD-10-CM

## 2017-04-14 DIAGNOSIS — R5383 Other fatigue: Secondary | ICD-10-CM | POA: Diagnosis not present

## 2017-04-14 DIAGNOSIS — D4989 Neoplasm of unspecified behavior of other specified sites: Secondary | ICD-10-CM

## 2017-04-14 DIAGNOSIS — M25531 Pain in right wrist: Secondary | ICD-10-CM

## 2017-04-14 NOTE — Patient Instructions (Signed)
Standing Labs We placed an order today for your standing lab work.    Please come back and get your standing labs in April and every 3 months including CK  We have open lab Monday through Friday from 8:30-11:30 AM and 1:30-4 PM at the office of Dr. Bo Merino.   The office is located at [address] [place], [place], [place] [address] No appointment is necessary.   Labs are drawn by [facility].  You may receive a bill from [facility] for your lab work. If you have any questions regarding directions or hours of operation,  please call [phone].

## 2017-04-23 DIAGNOSIS — D2271 Melanocytic nevi of right lower limb, including hip: Secondary | ICD-10-CM | POA: Diagnosis not present

## 2017-04-23 DIAGNOSIS — D2262 Melanocytic nevi of left upper limb, including shoulder: Secondary | ICD-10-CM | POA: Diagnosis not present

## 2017-04-23 DIAGNOSIS — M3390 Dermatopolymyositis, unspecified, organ involvement unspecified: Secondary | ICD-10-CM | POA: Diagnosis not present

## 2017-04-23 DIAGNOSIS — D2371 Other benign neoplasm of skin of right lower limb, including hip: Secondary | ICD-10-CM | POA: Diagnosis not present

## 2017-04-23 DIAGNOSIS — D2272 Melanocytic nevi of left lower limb, including hip: Secondary | ICD-10-CM | POA: Diagnosis not present

## 2017-04-23 DIAGNOSIS — L814 Other melanin hyperpigmentation: Secondary | ICD-10-CM | POA: Diagnosis not present

## 2017-04-23 DIAGNOSIS — D485 Neoplasm of uncertain behavior of skin: Secondary | ICD-10-CM | POA: Diagnosis not present

## 2017-04-23 DIAGNOSIS — D225 Melanocytic nevi of trunk: Secondary | ICD-10-CM | POA: Diagnosis not present

## 2017-04-23 DIAGNOSIS — L821 Other seborrheic keratosis: Secondary | ICD-10-CM | POA: Diagnosis not present

## 2017-04-23 DIAGNOSIS — Z85828 Personal history of other malignant neoplasm of skin: Secondary | ICD-10-CM | POA: Diagnosis not present

## 2017-04-23 DIAGNOSIS — L570 Actinic keratosis: Secondary | ICD-10-CM | POA: Diagnosis not present

## 2017-04-30 ENCOUNTER — Other Ambulatory Visit: Payer: Self-pay | Admitting: Rheumatology

## 2017-04-30 MED ORDER — PREDNISONE 1 MG PO TABS: 4.0000 mg | ORAL_TABLET | Freq: Every day | ORAL | 0 refills | Status: DC

## 2017-04-30 NOTE — Telephone Encounter (Signed)
Last Visit: 04/14/17 Next Visit: 07/16/17  Okay to refill per Dr. Estanislado Pandy

## 2017-05-04 DIAGNOSIS — J9859 Other diseases of mediastinum, not elsewhere classified: Secondary | ICD-10-CM | POA: Diagnosis not present

## 2017-05-04 DIAGNOSIS — M3390 Dermatopolymyositis, unspecified, organ involvement unspecified: Secondary | ICD-10-CM | POA: Diagnosis not present

## 2017-05-04 DIAGNOSIS — R200 Anesthesia of skin: Secondary | ICD-10-CM | POA: Diagnosis not present

## 2017-05-04 DIAGNOSIS — M3501 Sicca syndrome with keratoconjunctivitis: Secondary | ICD-10-CM | POA: Diagnosis not present

## 2017-05-04 DIAGNOSIS — R202 Paresthesia of skin: Secondary | ICD-10-CM | POA: Diagnosis not present

## 2017-05-07 ENCOUNTER — Ambulatory Visit (INDEPENDENT_AMBULATORY_CARE_PROVIDER_SITE_OTHER): Payer: Medicare Other | Admitting: Physical Medicine and Rehabilitation

## 2017-05-07 ENCOUNTER — Encounter (INDEPENDENT_AMBULATORY_CARE_PROVIDER_SITE_OTHER): Payer: Self-pay | Admitting: Physical Medicine and Rehabilitation

## 2017-05-07 DIAGNOSIS — R202 Paresthesia of skin: Secondary | ICD-10-CM

## 2017-05-07 NOTE — Progress Notes (Deleted)
Pt states numbness, tingling, and swelling in both right and left hands. Pt states on the left hand her middle and index finger at the tip is more numb than other. Pt states a more tight feeling in her right hand and not as much of a numb feeling. Pt states symptoms started in August 2018. Pt states the feeling is always there and when she sleeps and wake up its worse, moving them a lot make it better. Left hand dominant. No lotions or creams.

## 2017-05-10 NOTE — Progress Notes (Signed)
Marisa Orozco - 67 y.o. female MRN [id number]  Date of birth: 1950-07-25  Office Visit Note: Visit Date: 05/07/2017 PCP: Caryl Bis, MD Referred by: Caryl Bis, MD  Subjective: Chief Complaint  Patient presents with  . Right Hand - Numbness, Tingling  . Left Hand - Numbness, Tingling   HPI: Marisa Orozco is a pleasant 67 year old left-hand-dominant female who is sent here today by Dr. Estanislado Pandy for electrodiagnostic studies of both upper limbs.  She is treated for Sjogren's syndrome and dermatomyositis and is on high risk medication.  She reports chronic worsening numbness and pain particularly on the left hand in her middle and index finger and really at the tip more than the rest of the finger.  She states that she has more of a tight feeling on her right hand and not much of a numb feeling.  She reports tightness with gripping with the right hand.  She reports this started around August 2000.  She feels like it is always present but it does seem to wake her up at night.  She says that moving them a lot tends to make them feel better.  Medications have not really helped.  She does have a carpal tunnel splint.  She has not had prior electrodiagnostic studies at least recently.  She reports almost a posttraumatic stress situation from having a electrodiagnostic study performed at [facility] many many many years ago.    ROS Otherwise per HPI.  Assessment & Plan: Visit Diagnoses:  1. Paresthesia of skin     Plan: No additional findings.  Impression: The above electrodiagnostic study is ABNORMAL and reveals evidence of a moderate to severe left median nerve entrapment at the wrist (carpal tunnel syndrome) affecting sensory and motor components.   There is no significant electrodiagnostic evidence of any other focal nerve entrapment, brachial plexopathy or generalized peripheral neuropathy.   As you know, this particular electrodiagnostic study cannot rule out chemical radiculitis or  sensory only radiculopathy.  This electrodiagnostic study cannot rule out small fiber polyneuropathy and dysesthesias from central pain sensitization syndromes such as fibromyalgia.  Recommendations: 1.  Continue current management of symptoms. 2.  Follow-up with referring physician. 3.  Continue use of resting splint at night-time and as needed during the day. Consider carpal tunnel injection on the right. 4.  Suggest surgical evaluation.   Meds & Orders: No orders of the defined types were placed in this encounter.   Orders Placed This Encounter  Procedures  . NCV with EMG (electromyography)    Follow-up: Return for Dr. Estanislado Pandy.   Procedures: No procedures performed  EMG & NCV Findings: Evaluation of the left median motor nerve showed prolonged distal onset latency (5.7 ms) and reduced amplitude (3.7 mV).  The left median (across palm) sensory nerve showed prolonged distal peak latency (Wrist, 4.5 ms).  All remaining nerves (as indicated in the following tables) were within normal limits.  Left vs. Right side comparison data for the median motor nerve indicates abnormal L-R latency difference (2.3 ms) and abnormal L-R velocity difference (Elbow-Wrist, 144 m/s).  The ulnar motor nerve indicates abnormal L-R velocity difference (A Elbow-B Elbow, 41 m/s).  All remaining left vs. right side differences were within normal limits.    All examined muscles (as indicated in the following table) showed no evidence of electrical instability.    Impression: The above electrodiagnostic study is ABNORMAL and reveals evidence of a moderate to severe left median nerve entrapment at the wrist (carpal tunnel  syndrome) affecting sensory and motor components.   There is no significant electrodiagnostic evidence of any other focal nerve entrapment, brachial plexopathy or generalized peripheral neuropathy.   As you know, this particular electrodiagnostic study cannot rule out chemical radiculitis or  sensory only radiculopathy.  This electrodiagnostic study cannot rule out small fiber polyneuropathy and dysesthesias from central pain sensitization syndromes such as fibromyalgia.  Recommendations: 1.  Continue current management of symptoms. 2.  Follow-up with referring physician. 3.  Continue use of resting splint at night-time and as needed during the day. Consider carpal tunnel injection on the right. 4.  Suggest surgical evaluation.    Nerve Conduction Studies Anti Sensory Summary Table   Stim Site NR Peak (ms) Norm Peak (ms) P-T Amp (V) Norm P-T Amp Site1 Site2 Delta-P (ms) Dist (cm) Vel (m/s) Norm Vel (m/s)  Left Median Acr Palm Anti Sensory (2nd Digit)  33.8C  Wrist    *4.5 <3.6 11.1 >10 Wrist Palm 2.7 0.0    Palm    1.8 <2.0 13.4         Right Median Acr Palm Anti Sensory (2nd Digit)  32.3C  Wrist    3.5 <3.6 19.9 >10 Wrist Palm 1.8 0.0    Palm    1.7 <2.0 12.9         Left Radial Anti Sensory (Base 1st Digit)  32.7C  Wrist    2.1 <3.1 19.9  Wrist Base 1st Digit 2.1 0.0    Right Radial Anti Sensory (Base 1st Digit)  32.1C  Wrist    2.2 <3.1 10.9  Wrist Base 1st Digit 2.2 0.0    Left Ulnar Anti Sensory (5th Digit)  33.6C  Wrist    3.4 <3.7 16.5 >15.0 Wrist 5th Digit 3.4 14.0 41 >38  Right Ulnar Anti Sensory (5th Digit)  32.5C  Wrist    3.1 <3.7 19.2 >15.0 Wrist 5th Digit 3.1 14.0 45 >38   Motor Summary Table   Stim Site NR Onset (ms) Norm Onset (ms) O-P Amp (mV) Norm O-P Amp Site1 Site2 Delta-0 (ms) Dist (cm) Vel (m/s) Norm Vel (m/s)  Left Median Motor (Abd Poll Brev)  32.8C  Wrist    *5.7 <4.2 *3.7 >5 Elbow Wrist 1.1 21.5 195 >50  Elbow    6.8  2.0         Right Median Motor (Abd Poll Brev)  32C  Wrist    3.4 <4.2 6.2 >5 Elbow Wrist 4.0 20.5 51 >50  Elbow    7.4  2.5         Left Ulnar Motor (Abd Dig Min)  33.2C  Wrist    3.1 <4.2 7.8 >3 B Elbow Wrist 3.2 20.0 63 >53  B Elbow    6.3  7.6  A Elbow B Elbow 1.7 10.0 59 >53  A Elbow    8.0  7.4           Right Ulnar Motor (Abd Dig Min)  32.1C  Wrist    2.7 <4.2 7.4 >3 B Elbow Wrist 3.4 21.0 62 >53  B Elbow    6.1  7.3  A Elbow B Elbow 1.0 10.0 100 >53  A Elbow    7.1  7.0          EMG   Side Muscle Nerve Root Ins Act Fibs Psw Amp Dur Poly Recrt Int Fraser Din Comment  Left Abd Poll Brev Median C8-T1 Nml Nml Nml Nml Nml 0 Nml Nml   Left 1stDorInt Ulnar  C8-T1 Nml Nml Nml Nml Nml 0 Nml Nml   Left PronatorTeres Median C6-7 Nml Nml Nml Nml Nml 0 Nml Nml   Left Biceps Musculocut C5-6 Nml Nml Nml Nml Nml 0 Nml Nml   Left Deltoid Axillary C5-6 Nml Nml Nml Nml Nml 0 Nml Nml     Nerve Conduction Studies Anti Sensory Left/Right Comparison   Stim Site L Lat (ms) R Lat (ms) L-R Lat (ms) L Amp (V) R Amp (V) L-R Amp (%) Site1 Site2 L Vel (m/s) R Vel (m/s) L-R Vel (m/s)  Median Acr Palm Anti Sensory (2nd Digit)  33.8C  Wrist *4.5 3.5 1.0 11.1 19.9 44.2 Wrist Palm     Palm 1.8 1.7 0.1 13.4 12.9 3.7       Radial Anti Sensory (Base 1st Digit)  32.7C  Wrist 2.1 2.2 0.1 19.9 10.9 45.2 Wrist Base 1st Digit     Ulnar Anti Sensory (5th Digit)  33.6C  Wrist 3.4 3.1 0.3 16.5 19.2 14.1 Wrist 5th Digit 41 45 4   Motor Left/Right Comparison   Stim Site L Lat (ms) R Lat (ms) L-R Lat (ms) L Amp (mV) R Amp (mV) L-R Amp (%) Site1 Site2 L Vel (m/s) R Vel (m/s) L-R Vel (m/s)  Median Motor (Abd Poll Brev)  32.8C  Wrist *5.7 3.4 *2.3 *3.7 6.2 40.3 Elbow Wrist 195 51 *144  Elbow 6.8 7.4 0.6 2.0 2.5 20.0       Ulnar Motor (Abd Dig Min)  33.2C  Wrist 3.1 2.7 0.4 7.8 7.4 5.1 B Elbow Wrist 63 62 1  B Elbow 6.3 6.1 0.2 7.6 7.3 3.9 A Elbow B Elbow 59 100 *41  A Elbow 8.0 7.1 0.9 7.4 7.0 5.4          Waveforms:                     Clinical History: No specialty comments available.  She reports that she quit smoking about 13 years ago. Her smoking use included cigarettes. She has a 25.00 pack-year smoking history. she has never used smokeless tobacco. No results for input(s): HGBA1C, LABURIC in the last  8760 hours.  Objective:  VS:  HT:    WT:   BMI:     BP:   HR: bpm  TEMP: ( )  RESP:  Physical Exam  Musculoskeletal:  Inspection reveals no atrophy of the bilateral APB or FDI or hand intrinsics. There is no swelling, color changes, allodynia or dystrophic changes.  There is decreased sensation to light touch in the left median nerve distribution.  There is a negative Hoffmann's test bilaterally.    Ortho Exam Imaging: No results found.  Past Medical/Family/Surgical/Social History: Medications & Allergies reviewed per EMR Patient Active Problem List   Diagnosis Date Noted  . High risk medication use 04/07/2016  . Sjogren's disease ([place]) 02/04/2016  . Dermatomyositis ([place]) 02/04/2016  . Former smoker 02/04/2016  . 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 Fatigue 02/15/2012   Past Medical History:  Diagnosis Date  . Allergy   . Cancer ([place])    basal cell carcinoma - face  . Dermatomyositis ([place]) 02/04/2016   Jo 1 Positive  Skin Bx- Subtle interface Dermatitis   . Former smoker 02/04/2016  . GERD (gastroesophageal reflux disease)    otc med prn  . PONV (postoperative nausea and vomiting)   . Sjogren's disease ([place]) 02/04/2016   Positive ANA, Positive Ro, Positive CCP, Negative RF, Parotitis, Positive Sicca  . Sjogren's syndrome (  [place])    from MD note   Family History  Problem Relation Age of Onset  . COPD Mother   . Heart disease Mother   . Diabetes Father   . Kidney disease Father   . Epilepsy Daughter   . Colon cancer Neg Hx   . Esophageal cancer Neg Hx   . Stomach cancer Neg Hx   . Rectal cancer Neg Hx    Past Surgical History:  Procedure Laterality Date  . BREAST SURGERY     augmentation  . BUNIONECTOMY     right foot  . COLONOSCOPY    . DIAGNOSTIC LAPAROSCOPY    . DILATION AND CURETTAGE OF UTERUS  04/15/2011   Procedure: DILATATION AND CURETTAGE;  Surgeon: [name];  Location: [facility];  Service: Gynecology;  Laterality: N/A;  . MUSCLE BIOPSY Left 01/02/2016    Procedure: MUSCLE BIOPSY LEFT DELTOID;  Surgeon: [name], MD;  Location: [facility];  Service: General;  Laterality: Left;  left deltoid muscle bx  . NASAL SEPTUM SURGERY    . SVD     x 2  . TONSILLECTOMY    . WISDOM TOOTH EXTRACTION     Social History   Occupational History  . Not on file  Tobacco Use  . Smoking status: Former Smoker    Packs/day: 1.00    Years: 25.00    Pack years: 25.00    Types: Cigarettes    Last attempt to quit: 08/05/2003    Years since quitting: 13.7  . Smokeless tobacco: Never Used  Substance and Sexual Activity  . Alcohol use: Yes    Comment: socially - wine  . Drug use: No  . Sexual activity: Yes    Birth control/protection: None

## 2017-05-10 NOTE — Procedures (Signed)
EMG & NCV Findings: Evaluation of the left median motor nerve showed prolonged distal onset latency (5.7 ms) and reduced amplitude (3.7 mV).  The left median (across palm) sensory nerve showed prolonged distal peak latency (Wrist, 4.5 ms).  All remaining nerves (as indicated in the following tables) were within normal limits.  Left vs. Right side comparison data for the median motor nerve indicates abnormal L-R latency difference (2.3 ms) and abnormal L-R velocity difference (Elbow-Wrist, 144 m/s).  The ulnar motor nerve indicates abnormal L-R velocity difference (A Elbow-B Elbow, 41 m/s).  All remaining left vs. right side differences were within normal limits.    All examined muscles (as indicated in the following table) showed no evidence of electrical instability.    Impression: The above electrodiagnostic study is ABNORMAL and reveals evidence of a moderate to severe left median nerve entrapment at the wrist (carpal tunnel syndrome) affecting sensory and motor components.   There is no significant electrodiagnostic evidence of any other focal nerve entrapment, brachial plexopathy or generalized peripheral neuropathy.   As you know, this particular electrodiagnostic study cannot rule out chemical radiculitis or sensory only radiculopathy.  This electrodiagnostic study cannot rule out small fiber polyneuropathy and dysesthesias from central pain sensitization syndromes such as fibromyalgia.  Recommendations: 1.  Continue current management of symptoms. 2.  Follow-up with referring physician. 3.  Continue use of resting splint at night-time and as needed during the day. Consider carpal tunnel injection on the right. 4.  Suggest surgical evaluation.    Nerve Conduction Studies Anti Sensory Summary Table   Stim Site NR Peak (ms) Norm Peak (ms) P-T Amp (V) Norm P-T Amp Site1 Site2 Delta-P (ms) Dist (cm) Vel (m/s) Norm Vel (m/s)  Left Median Acr Palm Anti Sensory (2nd Digit)  33.8C  Wrist     *4.5 <3.6 11.1 >10 Wrist Palm 2.7 0.0    Palm    1.8 <2.0 13.4         Right Median Acr Palm Anti Sensory (2nd Digit)  32.3C  Wrist    3.5 <3.6 19.9 >10 Wrist Palm 1.8 0.0    Palm    1.7 <2.0 12.9         Left Radial Anti Sensory (Base 1st Digit)  32.7C  Wrist    2.1 <3.1 19.9  Wrist Base 1st Digit 2.1 0.0    Right Radial Anti Sensory (Base 1st Digit)  32.1C  Wrist    2.2 <3.1 10.9  Wrist Base 1st Digit 2.2 0.0    Left Ulnar Anti Sensory (5th Digit)  33.6C  Wrist    3.4 <3.7 16.5 >15.0 Wrist 5th Digit 3.4 14.0 41 >38  Right Ulnar Anti Sensory (5th Digit)  32.5C  Wrist    3.1 <3.7 19.2 >15.0 Wrist 5th Digit 3.1 14.0 45 >38   Motor Summary Table   Stim Site NR Onset (ms) Norm Onset (ms) O-P Amp (mV) Norm O-P Amp Site1 Site2 Delta-0 (ms) Dist (cm) Vel (m/s) Norm Vel (m/s)  Left Median Motor (Abd Poll Brev)  32.8C  Wrist    *5.7 <4.2 *3.7 >5 Elbow Wrist 1.1 21.5 195 >50  Elbow    6.8  2.0         Right Median Motor (Abd Poll Brev)  32C  Wrist    3.4 <4.2 6.2 >5 Elbow Wrist 4.0 20.5 51 >50  Elbow    7.4  2.5         Left Ulnar Motor (Abd Dig Min)  33.2C  Wrist    3.1 <4.2 7.8 >3 B Elbow Wrist 3.2 20.0 63 >53  B Elbow    6.3  7.6  A Elbow B Elbow 1.7 10.0 59 >53  A Elbow    8.0  7.4         Right Ulnar Motor (Abd Dig Min)  32.1C  Wrist    2.7 <4.2 7.4 >3 B Elbow Wrist 3.4 21.0 62 >53  B Elbow    6.1  7.3  A Elbow B Elbow 1.0 10.0 100 >53  A Elbow    7.1  7.0          EMG   Side Muscle Nerve Root Ins Act Fibs Psw Amp Dur Poly Recrt Int Fraser Din Comment  Left Abd Poll Brev Median C8-T1 Nml Nml Nml Nml Nml 0 Nml Nml   Left 1stDorInt Ulnar C8-T1 Nml Nml Nml Nml Nml 0 Nml Nml   Left PronatorTeres Median C6-7 Nml Nml Nml Nml Nml 0 Nml Nml   Left Biceps Musculocut C5-6 Nml Nml Nml Nml Nml 0 Nml Nml   Left Deltoid Axillary C5-6 Nml Nml Nml Nml Nml 0 Nml Nml     Nerve Conduction Studies Anti Sensory Left/Right Comparison   Stim Site L Lat (ms) R Lat (ms) L-R Lat (ms) L Amp (V) R  Amp (V) L-R Amp (%) Site1 Site2 L Vel (m/s) R Vel (m/s) L-R Vel (m/s)  Median Acr Palm Anti Sensory (2nd Digit)  33.8C  Wrist *4.5 3.5 1.0 11.1 19.9 44.2 Wrist Palm     Palm 1.8 1.7 0.1 13.4 12.9 3.7       Radial Anti Sensory (Base 1st Digit)  32.7C  Wrist 2.1 2.2 0.1 19.9 10.9 45.2 Wrist Base 1st Digit     Ulnar Anti Sensory (5th Digit)  33.6C  Wrist 3.4 3.1 0.3 16.5 19.2 14.1 Wrist 5th Digit 41 45 4   Motor Left/Right Comparison   Stim Site L Lat (ms) R Lat (ms) L-R Lat (ms) L Amp (mV) R Amp (mV) L-R Amp (%) Site1 Site2 L Vel (m/s) R Vel (m/s) L-R Vel (m/s)  Median Motor (Abd Poll Brev)  32.8C  Wrist *5.7 3.4 *2.3 *3.7 6.2 40.3 Elbow Wrist 195 51 *144  Elbow 6.8 7.4 0.6 2.0 2.5 20.0       Ulnar Motor (Abd Dig Min)  33.2C  Wrist 3.1 2.7 0.4 7.8 7.4 5.1 B Elbow Wrist 63 62 1  B Elbow 6.3 6.1 0.2 7.6 7.3 3.9 A Elbow B Elbow 59 100 *41  A Elbow 8.0 7.1 0.9 7.4 7.0 5.4          Waveforms:

## 2017-05-16 ENCOUNTER — Encounter: Payer: Self-pay | Admitting: Rheumatology

## 2017-05-17 ENCOUNTER — Other Ambulatory Visit: Payer: Self-pay

## 2017-05-17 DIAGNOSIS — Z79899 Other long term (current) drug therapy: Secondary | ICD-10-CM | POA: Diagnosis not present

## 2017-05-17 LAB — CK: CK TOTAL: 98 U/L (ref 29–143)

## 2017-05-17 NOTE — Telephone Encounter (Signed)
Ok to get CK checked.

## 2017-05-17 NOTE — Telephone Encounter (Signed)
Patient advised there is an order for the CK. Patient will come in today to have it drawn.

## 2017-06-15 ENCOUNTER — Encounter: Payer: Self-pay | Admitting: Rheumatology

## 2017-06-15 ENCOUNTER — Telehealth: Payer: Self-pay | Admitting: Rheumatology

## 2017-06-15 NOTE — Telephone Encounter (Signed)
Patient left a message this am requesting a refill on prednisone.

## 2017-06-15 NOTE — Telephone Encounter (Signed)
Sent patient a message via my chart.

## 2017-06-16 ENCOUNTER — Telehealth: Payer: Self-pay | Admitting: Rheumatology

## 2017-06-16 MED ORDER — PREDNISONE 5 MG PO TABS: ORAL_TABLET | ORAL | 0 refills | Status: DC

## 2017-06-16 MED ORDER — PREDNISONE 1 MG PO TABS: 4.0000 mg | ORAL_TABLET | Freq: Every day | ORAL | 0 refills | Status: DC

## 2017-06-16 NOTE — Telephone Encounter (Signed)
Please call to clarify Prednisone RX's. Pharmacy states they received two Rx's, with different mg, and directions.

## 2017-06-16 NOTE — Telephone Encounter (Signed)
Spoke with pharmacist and verified prescription and directions.

## 2017-06-16 NOTE — Telephone Encounter (Signed)
I spoke with patient. She has been going to [facility] rheumatology and fu with [place] as well. I apologized for the confusion. Please, refill prednisone.

## 2017-06-16 NOTE — Telephone Encounter (Signed)
Patient requesting a call back from Dr. Estanislado Pandy to discuss Prednisone refill. Per patient Dr. Estanislado Pandy sent her to [facility] in 2017.  [facility] has never prescribed an rx for her. Patient upset about this. Patient wants to discuss with Dr. Estanislado Pandy only.

## 2017-06-16 NOTE — Telephone Encounter (Signed)
Prescription sent to the pharmacy.

## 2017-06-21 NOTE — Progress Notes (Signed)
Office Visit Note  Patient: Marisa Orozco             Date of Birth: Dec 04, 1950           MRN: [id number]             PCP: Caryl Bis, MD Referring: Caryl Bis, MD Visit Date: 06/23/2017 Occupation: @GUAROCC @    Subjective:  Other (increased pain )   History of Present Illness: Marisa Orozco is a 67 y.o. female with history of dermatomyositis and Sjogren's.  She states she has been experiencing increased pain and discomfort recently.  She describes pain in almost all of her muscles.  She states also she has not been sleeping well.  She woke up this morning with dizziness headache and palpitations.  Activities of Daily Living:  Patient reports morning stiffness for 0 minute.   Patient Denies nocturnal pain.  Difficulty dressing/grooming: Denies Difficulty climbing stairs: Reports Difficulty getting out of chair: Denies Difficulty using hands for taps, buttons, cutlery, and/or writing: Denies   Review of Systems  Constitutional: Negative for fatigue, night sweats, weight gain, weight loss and weakness.  HENT: Negative for mouth sores, trouble swallowing, trouble swallowing, mouth dryness and nose dryness.   Eyes: Negative for pain, redness, visual disturbance and dryness.  Respiratory: Negative for cough, shortness of breath and difficulty breathing.   Cardiovascular: Positive for palpitations. Negative for chest pain, hypertension, irregular heartbeat and swelling in legs/feet.  Gastrointestinal: Negative for blood in stool, constipation and diarrhea.  Endocrine: Negative for increased urination.  Genitourinary: Negative for vaginal dryness.  Musculoskeletal: Positive for myalgias and myalgias. Negative for arthralgias, joint pain, joint swelling, muscle weakness, morning stiffness and muscle tenderness.  Skin: Negative for color change, rash, hair loss, skin tightness, ulcers and sensitivity to sunlight.  Allergic/Immunologic: Negative for susceptible to  infections.  Neurological: Positive for dizziness and headaches. Negative for memory loss and night sweats.  Hematological: Negative for swollen glands.  Psychiatric/Behavioral: Positive for sleep disturbance. Negative for depressed mood. The patient is not nervous/anxious.     PMFS History:  Patient Active Problem List   Diagnosis Date Noted  . High risk medication use 04/07/2016  . Sjogren's disease ([place]) 02/04/2016  . Dermatomyositis ([place]) 02/04/2016  . Former smoker 02/04/2016  . 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 Fatigue 02/15/2012    Past Medical History:  Diagnosis Date  . Allergy   . Cancer ([place])    basal cell carcinoma - face  . Dermatomyositis ([place]) 02/04/2016   Jo 1 Positive  Skin Bx- Subtle interface Dermatitis   . Former smoker 02/04/2016  . GERD (gastroesophageal reflux disease)    otc med prn  . PONV (postoperative nausea and vomiting)   . Sjogren's disease ([place]) 02/04/2016   Positive ANA, Positive Ro, Positive CCP, Negative RF, Parotitis, Positive Sicca  . Sjogren's syndrome ([place])    from MD note    Family History  Problem Relation Age of Onset  . COPD Mother   . Heart disease Mother   . Diabetes Father   . Kidney disease Father   . Epilepsy Daughter   . Colon cancer Neg Hx   . Esophageal cancer Neg Hx   . Stomach cancer Neg Hx   . Rectal cancer Neg Hx    Past Surgical History:  Procedure Laterality Date  . BREAST SURGERY     augmentation  . BUNIONECTOMY     right foot  . COLONOSCOPY    . DIAGNOSTIC LAPAROSCOPY    .  DILATION AND CURETTAGE OF UTERUS  04/15/2011   Procedure: DILATATION AND CURETTAGE;  Surgeon: [name];  Location: [facility];  Service: Gynecology;  Laterality: N/A;  . MUSCLE BIOPSY Left 01/02/2016   Procedure: MUSCLE BIOPSY LEFT DELTOID;  Surgeon: [name], MD;  Location: [facility];  Service: General;  Laterality: Left;  left deltoid muscle bx  . NASAL SEPTUM SURGERY    . SVD     x 2  . TONSILLECTOMY    . WISDOM  TOOTH EXTRACTION     Social History   Social History Narrative  . Not on file     Objective: Vital Signs: BP 130/69 (BP Location: Left Arm, Patient Position: Sitting, Cuff Size: Normal)   Pulse 95   Resp 15   Ht 5\' 6"  (1.676 m)   Wt 171 lb (77.6 kg)   BMI 27.60 kg/m    Physical Exam  Constitutional: She is oriented to person, place, and time. She appears well-developed and well-nourished.  HENT:  Head: Normocephalic and atraumatic.  Eyes: Conjunctivae and EOM are normal.  Neck: Normal range of motion.  Cardiovascular: Normal rate, regular rhythm, normal heart sounds and intact distal pulses.  Pulmonary/Chest: Effort normal and breath sounds normal.  Abdominal: Soft. Bowel sounds are normal.  Lymphadenopathy:    She has no cervical adenopathy.  Neurological: She is alert and oriented to person, place, and time.  Skin: Skin is warm and dry. Capillary refill takes less than 2 seconds.  Psychiatric: She has a normal mood and affect. Her behavior is normal.  Nursing note and vitals reviewed.    Musculoskeletal Exam: C-spine, thoracic and lumbar spine with good range of motion.  Shoulder joints, elbow joints, wrist joints, MCPs PIPs DIPs were in good range of motion.  She does have PIP DIP thickening in her hands.  CDAI Exam: No CDAI exam completed.    Investigation: No additional findings.PLQ eye exam: 01/18/2017 CBC Latest Ref Rng & Units 04/12/2017 01/12/2017 10/12/2016  WBC 3.8 - 10.8 Thousand/uL 8.4 7.4 6.0  Hemoglobin 11.7 - 15.5 g/dL 14.3 15.6(H) 15.0  Hematocrit 35.0 - 45.0 % 42.5 47.1(H) 45.1(H)  Platelets 140 - 400 Thousand/uL 215 187 210   CMP Latest Ref Rng & Units 04/12/2017 01/12/2017 10/12/2016  Glucose 65 - 99 mg/dL 95 82 100(H)  BUN 7 - 25 mg/dL 16 14 21   Creatinine 0.50 - 0.99 mg/dL 0.72 0.77 0.77  Sodium 135 - 146 mmol/L 141 142 142  Potassium 3.5 - 5.3 mmol/L 4.1 3.8 3.9  Chloride 98 - 110 mmol/L 106 105 107  CO2 20 - 32 mmol/L 27 30 23   Calcium 8.6 - 10.4  mg/dL 9.6 9.6 9.5  Total Protein 6.1 - 8.1 g/dL 7.0 7.1 6.8  Total Bilirubin 0.2 - 1.2 mg/dL 0.6 0.8 0.3  Alkaline Phos 33 - 130 U/L - - 44  AST 10 - 35 U/L 15 14 14   ALT 6 - 29 U/L 14 14 13     Imaging: No results found.  Speciality Comments: PLQ Eye Exam: 01/18/17 WNL @ [facility]. Follow up 1 year.    Procedures:  No procedures performed Allergies: Codeine; Flagyl [metronidazole hcl]; Nexium [esomeprazole magnesium]; Imuran [azathioprine]; Sulfa antibiotics; and Sulfa drugs cross reactors   Assessment / Plan:     Visit Diagnoses: Dermatomyositis ([place]) -patient complains of increased generalized pain and discomfort.  She is concerned that she may be having a dermatomyositis flare.  Because has been normal so far.  I will check her CK and sed rate again today.  She had no muscle weakness or tenderness on examination.  She had no difficulty getting out of a chair.  Plan: CK, Sedimentation rate.  I also reviewed her notes from [facility] rheumatology who is in agreement with the current treatment.  They recommended increasing Plaquenil to 200 mg twice daily.  Patient does not want to change her dose at this time.  Sjogren's syndrome with keratoconjunctivitis sicca ([place]): She denies any significant sicca symptoms.  High risk medication use - PLQ 200 mg AM and 100 mg q PM, prednisonePLQ Eye Exam: 01/18/17   - Plan: CBC with Differential/Platelet, COMPLETE METABOLIC PANEL WITH GFR today and every 3 months.  She is concerned that Plaquenil could be causing headaches palpitations and dizziness.  I do not think the symptoms are related as she has tolerated the medication very well so far.  I have advised her to schedule an appointment with the PCP or neurologist for evaluation of headaches and dizziness.  Primary osteoarthritis of both hands: She does have DIP PIP thickening and some discomfort in her hands.  No synovitis was noted.  Thymoma: She gets follow-up [facility].  Other fatigue -  Plan: VITAMIN D 25 Hydroxy (Vit-D Deficiency, Fractures)  Former smoker    Orders: Orders Placed This Encounter  Procedures  . CBC with Differential/Platelet  . COMPLETE METABOLIC PANEL WITH GFR  . VITAMIN D 25 Hydroxy (Vit-D Deficiency, Fractures)  . CK  . Sedimentation rate   No orders of the defined types were placed in this encounter.   Face-to-face time spent with patient was 30 minutes.  Greater than 50% of time was spent in counseling and coordination of care.  Follow-Up Instructions: Return in about 3 months (around 09/23/2017) for Dermatomyositis.   Bo Merino, MD  Note - This record has been created using Editor, commissioning.  Chart creation errors have been sought, but may not always  have been located. Such creation errors do not reflect on  the standard of medical care.

## 2017-06-23 ENCOUNTER — Ambulatory Visit (INDEPENDENT_AMBULATORY_CARE_PROVIDER_SITE_OTHER): Payer: Medicare Other | Admitting: Rheumatology

## 2017-06-23 ENCOUNTER — Encounter: Payer: Self-pay | Admitting: Rheumatology

## 2017-06-23 VITALS — BP 130/69 | HR 95 | Resp 15 | Ht 66.0 in | Wt 171.0 lb

## 2017-06-23 DIAGNOSIS — M19042 Primary osteoarthritis, left hand: Secondary | ICD-10-CM | POA: Diagnosis not present

## 2017-06-23 DIAGNOSIS — Z79899 Other long term (current) drug therapy: Secondary | ICD-10-CM

## 2017-06-23 DIAGNOSIS — D150 Benign neoplasm of thymus: Secondary | ICD-10-CM

## 2017-06-23 DIAGNOSIS — M6281 Muscle weakness (generalized): Secondary | ICD-10-CM | POA: Diagnosis not present

## 2017-06-23 DIAGNOSIS — D4989 Neoplasm of unspecified behavior of other specified sites: Secondary | ICD-10-CM

## 2017-06-23 DIAGNOSIS — M3390 Dermatopolymyositis, unspecified, organ involvement unspecified: Secondary | ICD-10-CM | POA: Diagnosis not present

## 2017-06-23 DIAGNOSIS — R5383 Other fatigue: Secondary | ICD-10-CM | POA: Diagnosis not present

## 2017-06-23 DIAGNOSIS — Z87891 Personal history of nicotine dependence: Secondary | ICD-10-CM | POA: Diagnosis not present

## 2017-06-23 DIAGNOSIS — M19041 Primary osteoarthritis, right hand: Secondary | ICD-10-CM | POA: Diagnosis not present

## 2017-06-23 DIAGNOSIS — M3501 Sicca syndrome with keratoconjunctivitis: Secondary | ICD-10-CM | POA: Diagnosis not present

## 2017-06-23 DIAGNOSIS — Z8639 Personal history of other endocrine, nutritional and metabolic disease: Secondary | ICD-10-CM | POA: Diagnosis not present

## 2017-06-23 NOTE — Addendum Note (Signed)
Addended by: [name] on: 06/23/2017 03:52 PM   Modules accepted: Orders

## 2017-06-24 ENCOUNTER — Encounter: Payer: Self-pay | Admitting: Rheumatology

## 2017-06-24 LAB — CBC WITH DIFFERENTIAL/PLATELET
BASOS ABS: 64 {cells}/uL (ref 0–200)
Basophils Relative: 0.5 %
Eosinophils Absolute: 230 cells/uL (ref 15–500)
Eosinophils Relative: 1.8 %
HEMATOCRIT: 42.7 % (ref 35.0–45.0)
Hemoglobin: 14.1 g/dL (ref 11.7–15.5)
LYMPHS ABS: 858 {cells}/uL (ref 850–3900)
MCH: 27.9 pg (ref 27.0–33.0)
MCHC: 33 g/dL (ref 32.0–36.0)
MCV: 84.6 fL (ref 80.0–100.0)
MPV: 10.9 fL (ref 7.5–12.5)
Monocytes Relative: 4.7 %
NEUTROS PCT: 86.3 %
Neutro Abs: 11046 cells/uL — ABNORMAL HIGH (ref 1500–7800)
Platelets: 317 10*3/uL (ref 140–400)
RBC: 5.05 10*6/uL (ref 3.80–5.10)
RDW: 11.8 % (ref 11.0–15.0)
Total Lymphocyte: 6.7 %
WBC mixed population: 602 cells/uL (ref 200–950)
WBC: 12.8 10*3/uL — ABNORMAL HIGH (ref 3.8–10.8)

## 2017-06-24 LAB — SEDIMENTATION RATE: Sed Rate: 22 mm/h (ref 0–30)

## 2017-06-24 LAB — COMPLETE METABOLIC PANEL WITH GFR
AG RATIO: 1.4 (calc) (ref 1.0–2.5)
ALT: 20 U/L (ref 6–29)
AST: 26 U/L (ref 10–35)
Albumin: 4.2 g/dL (ref 3.6–5.1)
Alkaline phosphatase (APISO): 55 U/L (ref 33–130)
BUN: 14 mg/dL (ref 7–25)
CALCIUM: 9.2 mg/dL (ref 8.6–10.4)
CO2: 28 mmol/L (ref 20–32)
Chloride: 104 mmol/L (ref 98–110)
Creat: 0.76 mg/dL (ref 0.50–0.99)
GFR, EST NON AFRICAN AMERICAN: 82 mL/min/{1.73_m2} (ref >=60)
GFR, Est African American: 95 mL/min/{1.73_m2} (ref >=60)
GLOBULIN: 3 g/dL (ref 1.9–3.7)
Glucose, Bld: 100 mg/dL — ABNORMAL HIGH (ref 65–99)
POTASSIUM: 4.5 mmol/L (ref 3.5–5.3)
SODIUM: 139 mmol/L (ref 135–146)
Total Bilirubin: 0.6 mg/dL (ref 0.2–1.2)
Total Protein: 7.2 g/dL (ref 6.1–8.1)

## 2017-06-24 LAB — CK: CK TOTAL: 271 U/L — AB (ref 29–143)

## 2017-06-24 LAB — VITAMIN D 25 HYDROXY (VIT D DEFICIENCY, FRACTURES): Vit D, 25-Hydroxy: 38 ng/mL (ref 30–100)

## 2017-06-24 NOTE — Progress Notes (Signed)
I called patient and discussed results. She was advised to increase Prednisone to 10 mg po qd and PLQ to 200 mg po bid. Repeat labs in 2 weeks BMP and CK then CK q month.

## 2017-06-28 ENCOUNTER — Telehealth: Payer: Self-pay | Admitting: Rheumatology

## 2017-06-28 MED ORDER — HYDROXYCHLOROQUINE SULFATE 200 MG PO TABS: 200.0000 mg | ORAL_TABLET | Freq: Two times a day (BID) | ORAL | 0 refills | Status: DC

## 2017-06-28 NOTE — Telephone Encounter (Signed)
Patient states she has increased Prednisone to 10 mg. Patient states it has helped some but not considerably. Patient states she is not seeing a significant  Improvement. She states she has been hurting this way for a few months. Patient states her knees and her arms are still hurting her. Patient would like to know what else can be done.

## 2017-06-28 NOTE — Telephone Encounter (Signed)
We can repeat her CK in 1 week.  If her CK is not normal we can add methotrexate or Imuran.  We have discussed these medications with her in the past.

## 2017-06-28 NOTE — Telephone Encounter (Signed)
Patient advised of recommendations and verbalized understanding.  

## 2017-06-28 NOTE — Telephone Encounter (Signed)
Last Visit: 06/23/17 Next Visit: 07/16/17 Labs: 06/23/17 elevated WBC PLQ Eye Exam: 01/18/17 WNL   Okay to refill per Dr. Estanislado Pandy

## 2017-06-28 NOTE — Telephone Encounter (Signed)
Patient called requesting prescription refill of Plaquenil.  Patient requests for the prescription to be sent to [facility] on [address].  Patient states she will run out of medication on Wednesday 3/27.

## 2017-06-28 NOTE — Telephone Encounter (Signed)
Patient also wanted Dr. Estanislado Pandy to know that the Prednisone is not helping.

## 2017-07-02 ENCOUNTER — Other Ambulatory Visit: Payer: Self-pay

## 2017-07-02 ENCOUNTER — Ambulatory Visit: Payer: Medicare Other | Attending: Rheumatology | Admitting: Physical Therapy

## 2017-07-02 ENCOUNTER — Encounter: Payer: Self-pay | Admitting: Physical Therapy

## 2017-07-02 DIAGNOSIS — R262 Difficulty in walking, not elsewhere classified: Secondary | ICD-10-CM | POA: Insufficient documentation

## 2017-07-02 DIAGNOSIS — M79604 Pain in right leg: Secondary | ICD-10-CM | POA: Diagnosis not present

## 2017-07-02 DIAGNOSIS — M6281 Muscle weakness (generalized): Secondary | ICD-10-CM | POA: Insufficient documentation

## 2017-07-02 DIAGNOSIS — M79605 Pain in left leg: Secondary | ICD-10-CM | POA: Insufficient documentation

## 2017-07-02 NOTE — Therapy (Signed)
[facility] [address] [place], [place], [address] Phone: [phone]   Fax:  [phone]  Physical Therapy Evaluation  Patient Details  Name: Marisa Orozco MRN: [id number] Date of Birth: 1950-12-07 Referring Provider: Bo Merino, MD (Rheumatology)   Encounter Date: 07/02/2017  PT End of Session - 07/02/17 1536    Visit Number  1    Number of Visits  13    Date for PT Re-Evaluation  08/31/17    Authorization Type  Medicare, UHC    PT Start Time  1020    PT Stop Time  1107    PT Time Calculation (min)  47 min    Activity Tolerance  Patient tolerated treatment well    Behavior During Therapy  [facility] for tasks assessed/performed       Past Medical History:  Diagnosis Date  . Allergy   . Cancer ([place])    basal cell carcinoma - face  . Dermatomyositis ([place]) 02/04/2016   Jo 1 Positive  Skin Bx- Subtle interface Dermatitis   . Former smoker 02/04/2016  . GERD (gastroesophageal reflux disease)    otc med prn  . PONV (postoperative nausea and vomiting)   . Sjogren's disease ([place]) 02/04/2016   Positive ANA, Positive Ro, Positive CCP, Negative RF, Parotitis, Positive Sicca  . Sjogren's syndrome ([place])    from MD note    Past Surgical History:  Procedure Laterality Date  . BREAST SURGERY     augmentation  . BUNIONECTOMY     right foot  . COLONOSCOPY    . DIAGNOSTIC LAPAROSCOPY    . DILATION AND CURETTAGE OF UTERUS  04/15/2011   Procedure: DILATATION AND CURETTAGE;  Surgeon: [name];  Location: [facility];  Service: Gynecology;  Laterality: N/A;  . MUSCLE BIOPSY Left 01/02/2016   Procedure: MUSCLE BIOPSY LEFT DELTOID;  Surgeon: [name], MD;  Location: [facility];  Service: General;  Laterality: Left;  left deltoid muscle bx  . NASAL SEPTUM SURGERY    . SVD     x 2  . TONSILLECTOMY    . WISDOM TOOTH EXTRACTION      There were no vitals filed for this visit.   Subjective  Assessment - 07/02/17 1028    Subjective  Pt reports first flare of dermatomyositis when 15 and had to take prednisone for 3 years with no flare x 50 years.  At age 35 pt started low dose of testosterone during menopause and had another flare of dermatomyositis and Sjogren's disease (2017).  Pt currently experiencing pain, axial/proximal mm and weakness and joint pain (elbows, hands, knees).   Has difficulty getting out of the tub, going up/down stairs and walking is painful.     Pertinent History  prolonged use of Prednisone - is weaning down; thymoma, Sjogren's disease, basal cell carcinoma - face, OA both hands    Limitations  Walking;House hold activities    Patient Stated Goals  to exercise safely    Currently in Pain?  Yes    Pain Score  4     Pain Location  Generalized    Pain Descriptors / Indicators  Sore;Constant;Aching;Discomfort    Pain Type  Chronic pain    Pain Onset  More than a month ago    Pain Frequency  Constant    Pain Relieving Factors  warm water and Epsom salt         [facility] PT Assessment - 07/02/17 1043  Assessment   Medical Diagnosis  LE weakness    Referring Provider  Bo Merino, MD (Rheumatology)    Onset Date/Surgical Date  11/05/15      Precautions   Precautions  Other (comment)    Precaution Comments  prolonged use of Prednisone - is weaning down; thymoma, Sjogren's disease, basal cell carcinoma - face, OA both hands      Balance Screen   Has the patient fallen in the past 6 months  No    Has the patient had a decrease in activity level because of a fear of falling?   Yes    Is the patient reluctant to leave their home because of a fear of falling?   No      Home Social worker  Private residence    Living Arrangements  Spouse/significant other;Children daughter with disability    Type of [place] to enter    Entrance Stairs-Number of Steps  2    Entrance Stairs-Rails  None    Home Layout  Two  level;Able to live on main level with bedroom/bathroom      Prior Function   Level of Independence  Independent    Vocation  Retired      Observation/Other Assessments   Focus on Therapeutic Outcomes (FOTO)   not indicated      Sensation   Light Touch  Appears Intact      ROM / Strength   AROM / PROM / Strength  Strength      Strength   Overall Strength  Deficits    Overall Strength Comments  More proximal mm weakness: hip flexor 3+/5, hip ABD 4-/5, hip extension 4+/5, hip IR 4/5, hip ER 4+/5.  Knee and ankles WFL      Ambulation/Gait   Stairs  Yes    Stairs Assistance  5: Supervision    Stairs Assistance Details (indicate cue type and reason)  no rails to ascend, one rail to descend due to decreased eccentric control    Stair Management Technique  No rails;One rail Right;Alternating pattern;Forwards    Number of Stairs  4    Height of Stairs  6      Standardized Balance Assessment   Standardized Balance Assessment  Five Times Sit to Stand    Five times sit to stand comments   13 seconds without UE from arm chair; decreased eccentric strength              No data recorded  Objective measurements completed on examination: See above findings.              PT Education - 07/02/17 1536    Education provided  Yes    Education Details  clinical findings, PT POC, goals    Person(s) Educated  Patient    Methods  Explanation    Comprehension  Verbalized understanding       PT Short Term Goals - 07/02/17 1619      PT SHORT TERM GOAL #1   Title  Pt will demonstrate independence with initial HEP/walking program    Time  4    Period  Weeks    Status  New    Target Date  08/01/17      PT SHORT TERM GOAL #2   Title  Pt will improve functional LE strength with decreased five time sit to stand time to </= 12 seconds    Baseline  15  seconds    Time  4    Period  Weeks    Status  New    Target Date  08/01/17      PT SHORT TERM GOAL #3   Title  Pt will  improve MMT of proximal hip mm by 1/2 mm grade    Baseline  see eval flowsheet    Time  4    Period  Weeks    Status  New    Target Date  08/01/17      PT SHORT TERM GOAL #4   Title  Pt will negotiate 8 stairs with one rail supervision with improved eccentric control    Time  4    Period  Weeks    Status  New    Target Date  08/01/17      PT SHORT TERM GOAL #5   Title  Aquatic PT goal?        PT Long Term Goals - 07/02/17 1651      PT LONG TERM GOAL #1   Title  Pt will demonstrate independence with final gym/wellness HEP, walking program and aquatic exercise    Time  8    Period  Weeks    Status  New    Target Date  08/31/17      PT LONG TERM GOAL #2   Title  Pt will improve functional LE strength as indicated by decrease in five time sit to stand time to </= 10 seconds    Time  8    Period  Weeks    Status  New    Target Date  08/31/17      PT LONG TERM GOAL #3   Title  Pt will report being able to stand from bath tub (without going to hands and knees) independently at home and will perform floor > stand with UE support MOD I    Time  8    Period  Weeks    Status  New    Target Date  08/31/17      PT LONG TERM GOAL #4   Title  Pt will negotiate 8 stairs, no rails MOD I    Time  8    Period  Weeks    Status  New    Target Date  08/31/17      PT LONG TERM GOAL #5   Title  Aquatic Goal?             Plan - 07/02/17 1537    Clinical Impression Statement  Pt is a 67 year old female referred to Neuro OPPT for evaluation of LE weakness.  Pt's PMH is significant for the following: prolonged use of Prednisone - is weaning down; thymoma, Sjogren's disease, basal cell carcinoma - face, OA both hands. The following deficits were noted during pt's exam: generalized pain and mm soreness, proximal muscle weakness, impaired balance and impaired gait.  Pt's five time sit to stand score indicates pt is at risk for falls due to decreased functional LE strength. Pt would  benefit from skilled PT to address these impairments and functional limitations to maximize functional mobility independence and reduce falls risk.    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, retired by works part time, chronic autoimmune disease, prolonged use of Prednisone - is weaning down; thymoma, Sjogren's disease, basal cell carcinoma - face, OA both hands    Clinical Decision Making  Low    Rehab Potential  Fair    Clinical Impairments Affecting Rehab Potential  active autoimmune disease    PT Frequency  Other (comment) 2x/week x 4 weeks, 1x/week x 4 weeks    PT Duration  8 weeks    PT Treatment/Interventions  ADLs/Self Care Home Management;Aquatic Therapy;Moist Heat;Gait training;Stair training;Functional mobility training;Therapeutic activities;Therapeutic exercise;Patient/family education;Passive range of motion;Energy conservation;Neuromuscular re-education;Balance training    PT Next Visit Plan  assess floor > stand and reset goal, initiate HEP as below,     PT Home Exercise Plan  walking program, aquatic therapy?, gentle strengthening with low weight/low resistance; eccentric strengthening, balance    Consulted and Agree with Plan of Care  Patient       Patient will benefit from skilled therapeutic intervention in order to improve the following deficits and impairments:  Decreased strength, Difficulty walking, Impaired UE functional use, Pain, Decreased balance  Visit Diagnosis: Pain in left leg  Pain in right leg  Muscle weakness (generalized)  Difficulty in walking, not elsewhere  classified     Problem List Patient Active Problem List   Diagnosis Date Noted  . High risk medication use 04/07/2016  . Sjogren's disease ([place]) 02/04/2016  . Dermatomyositis ([place]) 02/04/2016  . Former smoker 02/04/2016  . 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 Fatigue 02/15/2012   Marisa Orozco, PT, DPT 07/02/17    4:56 PM    [facility] [address] [place], [place], [address] Phone: [phone]   Fax:  [phone]  Name: Marisa Orozco MRN: [id number] Date of Birth: [birth year]

## 2017-07-05 NOTE — Progress Notes (Deleted)
Office Visit Note  Patient: Marisa Orozco             Date of Birth: 1950/08/19           MRN: [id number]             PCP: Caryl Bis, MD Referring: Caryl Bis, MD Visit Date: 07/16/2017 Occupation: @GUAROCC @    Subjective:  No chief complaint on file.   History of Present Illness: Marisa Orozco is a 67 y.o. female ***   Activities of Daily Living:  Patient reports morning stiffness for *** {minute/hour:19697}.   Patient {ACTIONS;DENIES/REPORTS:21021675::"Denies"} nocturnal pain.  Difficulty dressing/grooming: {ACTIONS;DENIES/REPORTS:21021675::"Denies"} Difficulty climbing stairs: {ACTIONS;DENIES/REPORTS:21021675::"Denies"} Difficulty getting out of chair: {ACTIONS;DENIES/REPORTS:21021675::"Denies"} Difficulty using hands for taps, buttons, cutlery, and/or writing: {ACTIONS;DENIES/REPORTS:21021675::"Denies"}   No Rheumatology ROS completed.   PMFS History:  Patient Active Problem List   Diagnosis Date Noted  . High risk medication use 04/07/2016  . Sjogren's disease ([place]) 02/04/2016  . 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 Former smoker 02/04/2016  . 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 Fatigue 02/15/2012    Past Medical History:  Diagnosis Date  . Allergy   . Cancer ([place])    basal cell carcinoma - face  . 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 Former smoker 02/04/2016  . GERD (gastroesophageal reflux disease)    otc med prn  . PONV (postoperative nausea and vomiting)   . Sjogren's disease ([place]) 02/04/2016   Positive ANA, Positive Ro, Positive CCP, Negative RF, Parotitis, Positive Sicca  . Sjogren's syndrome ([place])    from MD note    Family History  Problem Relation Age of Onset  . COPD Mother   . Heart disease Mother   . Diabetes Father   . Kidney disease Father   . Epilepsy Daughter   . Colon cancer Neg Hx   . Esophageal cancer Neg Hx   . Stomach cancer Neg Hx   . Rectal cancer Neg Hx    Past Surgical  History:  Procedure Laterality Date  . BREAST SURGERY     augmentation  . BUNIONECTOMY     right foot  . COLONOSCOPY    . DIAGNOSTIC LAPAROSCOPY    . DILATION AND CURETTAGE OF UTERUS  04/15/2011   Procedure: DILATATION AND CURETTAGE;  Surgeon: [name];  Location: [facility];  Service: Gynecology;  Laterality: N/A;  . MUSCLE BIOPSY Left 01/02/2016   Procedure: MUSCLE BIOPSY LEFT DELTOID;  Surgeon: [name], MD;  Location: [facility];  Service: General;  Laterality: Left;  left deltoid muscle bx  . NASAL SEPTUM SURGERY    . SVD     x 2  . TONSILLECTOMY    . WISDOM TOOTH EXTRACTION     Social History   Social History Narrative  . Not on file     Objective: Vital Signs: There were no vitals taken for this visit.   Physical Exam   Musculoskeletal Exam: ***  CDAI Exam: No CDAI exam completed.    Investigation: No additional findings.PLQ eye exam: 01/18/2017 CBC Latest Ref Rng & Units 06/23/2017 04/12/2017 01/12/2017  WBC 3.8 - 10.8 Thousand/uL 12.8(H) 8.4 7.4  Hemoglobin 11.7 - 15.5 g/dL 14.1 14.3 15.6(H)  Hematocrit 35.0 - 45.0 % 42.7 42.5 47.1(H)  Platelets 140 - 400 Thousand/uL 317 215 187   CMP Latest Ref Rng & Units 06/23/2017 04/12/2017 01/12/2017  Glucose 65 - 99 mg/dL 100(H) 95 82  BUN 7 - 25 mg/dL 14 16 14   Creatinine 0.50 - 0.99 mg/dL 0.76 0.72 0.77  Sodium 135 - 146 mmol/L 139 141 142  Potassium 3.5 - 5.3 mmol/L 4.5 4.1 3.8  Chloride 98 - 110 mmol/L 104 106 105  CO2 20 - 32 mmol/L 28 27 30   Calcium 8.6 - 10.4 mg/dL 9.2 9.6 9.6  Total Protein 6.1 - 8.1 g/dL 7.2 7.0 7.1  Total Bilirubin 0.2 - 1.2 mg/dL 0.6 0.6 0.8  Alkaline Phos 33 - 130 U/L - - -  AST 10 - 35 U/L 26 15 14   ALT 6 - 29 U/L 20 14 14     Imaging: No results found.  Speciality Comments: PLQ Eye Exam: 01/18/17 WNL @ [facility]. Follow up 1 year.    Procedures:  No procedures performed Allergies: Codeine; Flagyl [metronidazole hcl]; Nexium [esomeprazole  magnesium]; Imuran [azathioprine]; Sulfa antibiotics; and Sulfa drugs cross reactors   Assessment / Plan:     Visit Diagnoses: No diagnosis found.    Orders: No orders of the defined types were placed in this encounter.  No orders of the defined types were placed in this encounter.   Face-to-face time spent with patient was *** minutes. 50% of time was spent in counseling and coordination of care.  Follow-Up Instructions: No follow-ups on file.   [name], CMA  Note - This record has been created using Editor, commissioning.  Chart creation errors have been sought, but may not always  have been located. Such creation errors do not reflect on  the standard of medical care.

## 2017-07-06 ENCOUNTER — Other Ambulatory Visit: Payer: Self-pay

## 2017-07-06 DIAGNOSIS — Z79899 Other long term (current) drug therapy: Secondary | ICD-10-CM

## 2017-07-06 LAB — CK: CK TOTAL: 317 U/L — AB (ref 29–143)

## 2017-07-07 ENCOUNTER — Encounter: Payer: Self-pay | Admitting: Rheumatology

## 2017-07-07 ENCOUNTER — Ambulatory Visit (INDEPENDENT_AMBULATORY_CARE_PROVIDER_SITE_OTHER): Payer: Medicare Other | Admitting: Rheumatology

## 2017-07-07 VITALS — BP 111/64 | HR 78 | Resp 14 | Ht 66.0 in | Wt 171.0 lb

## 2017-07-07 DIAGNOSIS — M3501 Sicca syndrome with keratoconjunctivitis: Secondary | ICD-10-CM | POA: Diagnosis not present

## 2017-07-07 DIAGNOSIS — M3390 Dermatopolymyositis, unspecified, organ involvement unspecified: Secondary | ICD-10-CM

## 2017-07-07 DIAGNOSIS — M19041 Primary osteoarthritis, right hand: Secondary | ICD-10-CM | POA: Diagnosis not present

## 2017-07-07 DIAGNOSIS — M19042 Primary osteoarthritis, left hand: Secondary | ICD-10-CM | POA: Diagnosis not present

## 2017-07-07 DIAGNOSIS — Z79899 Other long term (current) drug therapy: Secondary | ICD-10-CM | POA: Diagnosis not present

## 2017-07-07 DIAGNOSIS — D150 Benign neoplasm of thymus: Secondary | ICD-10-CM

## 2017-07-07 DIAGNOSIS — Z87891 Personal history of nicotine dependence: Secondary | ICD-10-CM

## 2017-07-07 DIAGNOSIS — R5383 Other fatigue: Secondary | ICD-10-CM

## 2017-07-07 DIAGNOSIS — D4989 Neoplasm of unspecified behavior of other specified sites: Secondary | ICD-10-CM

## 2017-07-07 MED ORDER — PREDNISONE 10 MG PO TABS: 20.0000 mg | ORAL_TABLET | Freq: Every day | ORAL | 0 refills | Status: DC

## 2017-07-07 NOTE — Progress Notes (Signed)
Office Visit Note  Patient: Marisa Orozco             Date of Birth: 10/04/1950           MRN: [id number]             PCP: Caryl Bis, MD Referring: Caryl Bis, MD Visit Date: 07/07/2017 Occupation: @GUAROCC @    Subjective:  Medication Management   History of Present Illness: Marisa Orozco is a 67 y.o. female history of dermatomyositis and Sjogren's.  She states on the higher dose of prednisone which she is at 10 mg a day now she has noticed improvement in her muscle weakness and discomfort.  She has had no benefit from Plaquenil which she wants to discontinue.  She also has some stiffness in her hands due to underlying osteoarthritis.  Activities of Daily Living:  Patient reports morning stiffness for 0 minute.   Patient Denies nocturnal pain.  Difficulty dressing/grooming: Denies Difficulty climbing stairs: Reports Difficulty getting out of chair: Denies Difficulty using hands for taps, buttons, cutlery, and/or writing: Denies   Review of Systems  Constitutional: Positive for fatigue. Negative for night sweats, weight gain and weight loss.  HENT: Positive for mouth dryness. Negative for mouth sores, trouble swallowing, trouble swallowing and nose dryness.   Eyes: Negative for pain, redness, visual disturbance and dryness.  Respiratory: Negative for cough, shortness of breath and difficulty breathing.   Cardiovascular: Negative for chest pain, palpitations, hypertension, irregular heartbeat and swelling in legs/feet.  Gastrointestinal: Negative for blood in stool, constipation and diarrhea.  Endocrine: Negative for increased urination.  Genitourinary: Negative for vaginal dryness.  Musculoskeletal: Positive for arthralgias and joint pain. Negative for joint swelling, myalgias, muscle weakness, morning stiffness, muscle tenderness and myalgias.  Skin: Negative for color change, rash, hair loss, skin tightness, ulcers and sensitivity to sunlight.    Allergic/Immunologic: Negative for susceptible to infections.  Neurological: Negative for dizziness, memory loss, night sweats and weakness.  Hematological: Negative for swollen glands.  Psychiatric/Behavioral: Negative for depressed mood and sleep disturbance. The patient is not nervous/anxious.     PMFS History:  Patient Active Problem List   Diagnosis Date Noted  . High risk medication use 04/07/2016  . Sjogren's disease ([place]) 02/04/2016  . 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 Former smoker 02/04/2016  . 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 Fatigue 02/15/2012    Past Medical History:  Diagnosis Date  . Allergy   . Cancer ([place])    basal cell carcinoma - face  . 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 Former smoker 02/04/2016  . GERD (gastroesophageal reflux disease)    otc med prn  . PONV (postoperative nausea and vomiting)   . Sjogren's disease ([place]) 02/04/2016   Positive ANA, Positive Ro, Positive CCP, Negative RF, Parotitis, Positive Sicca  . Sjogren's syndrome ([place])    from MD note    Family History  Problem Relation Age of Onset  . COPD Mother   . Heart disease Mother   . Diabetes Father   . Kidney disease Father   . Epilepsy Daughter   . Colon cancer Neg Hx   . Esophageal cancer Neg Hx   . Stomach cancer Neg Hx   . Rectal cancer Neg Hx    Past Surgical History:  Procedure Laterality Date  . BREAST SURGERY     augmentation  . BUNIONECTOMY     right foot  . COLONOSCOPY    .  DIAGNOSTIC LAPAROSCOPY    . DILATION AND CURETTAGE OF UTERUS  04/15/2011   Procedure: DILATATION AND CURETTAGE;  Surgeon: [name];  Location: [facility];  Service: Gynecology;  Laterality: N/A;  . MUSCLE BIOPSY Left 01/02/2016   Procedure: MUSCLE BIOPSY LEFT DELTOID;  Surgeon: [name], MD;  Location: [facility];  Service: General;  Laterality: Left;  left deltoid muscle bx  . NASAL SEPTUM SURGERY    . SVD     x 2  .  TONSILLECTOMY    . WISDOM TOOTH EXTRACTION     Social History   Social History Narrative  . Not on file     Objective: Vital Signs: BP 111/64 (BP Location: Left Arm, Patient Position: Sitting, Cuff Size: Normal)   Pulse 78   Resp 14   Ht 5\' 6"  (1.676 m)   Wt 171 lb (77.6 kg)   BMI 27.60 kg/m    Physical Exam  Constitutional: She is oriented to person, place, and time. She appears well-developed and well-nourished.  HENT:  Head: Normocephalic and atraumatic.  Eyes: Conjunctivae and EOM are normal.  Neck: Normal range of motion.  Cardiovascular: Normal rate, regular rhythm, normal heart sounds and intact distal pulses.  Pulmonary/Chest: Effort normal and breath sounds normal.  Abdominal: Soft. Bowel sounds are normal.  Lymphadenopathy:    She has no cervical adenopathy.  Neurological: She is alert and oriented to person, place, and time.  Skin: Skin is warm and dry. Capillary refill takes less than 2 seconds.  Psychiatric: She has a normal mood and affect. Her behavior is normal.  Nursing note and vitals reviewed.    Musculoskeletal Exam: C-spine thoracic lumbar spine good range of motion.  Shoulder joints of the joints was transferred good range of motion.  She has DIP PIP thickening in the bilateral hands consistent with osteoarthritis.  Hip joints knee joints ankles with good range of motion.  Her muscle strength was 4+/5 in both upper and lower extremities.  CDAI Exam: No CDAI exam completed.    Investigation: No additional findings.PLQ eye exam: 10/15/218 CBC Latest Ref Rng & Units 06/23/2017 04/12/2017 01/12/2017  WBC 3.8 - 10.8 Thousand/uL 12.8(H) 8.4 7.4  Hemoglobin 11.7 - 15.5 g/dL 14.1 14.3 15.6(H)  Hematocrit 35.0 - 45.0 % 42.7 42.5 47.1(H)  Platelets 140 - 400 Thousand/uL 317 215 187   CMP Latest Ref Rng & Units 06/23/2017 04/12/2017 01/12/2017  Glucose 65 - 99 mg/dL 100(H) 95 82  BUN 7 - 25 mg/dL 14 16 14   Creatinine 0.50 - 0.99 mg/dL 0.76 0.72 0.77  Sodium  135 - 146 mmol/L 139 141 142  Potassium 3.5 - 5.3 mmol/L 4.5 4.1 3.8  Chloride 98 - 110 mmol/L 104 106 105  CO2 20 - 32 mmol/L 28 27 30   Calcium 8.6 - 10.4 mg/dL 9.2 9.6 9.6  Total Protein 6.1 - 8.1 g/dL 7.2 7.0 7.1  Total Bilirubin 0.2 - 1.2 mg/dL 0.6 0.6 0.8  Alkaline Phos 33 - 130 U/L - - -  AST 10 - 35 U/L 26 15 14   ALT 6 - 29 U/L 20 14 14    July 06, 2017 CK 317  TB Gold indeterminate x 2 , PPD negative in 2017 Imaging: No results found.  Speciality Comments: PLQ Eye Exam: 01/18/17 WNL @ [facility]. Follow up 1 year.    Procedures:  No procedures performed Allergies: Codeine; Flagyl [metronidazole hcl]; Nexium [esomeprazole magnesium]; Imuran [azathioprine]; Sulfa antibiotics; and Sulfa drugs cross reactors  Assessment / Plan:     Visit Diagnoses: Dermatomyositis ([place]): Jo 1+, positive muscle biopsy for dermatomyositis.  Patient is having increased pain and stiffness.  Her CK has been elevated in the 300s.  She increased her prednisone to 10 mg p.o. we will increase prednisone further to 10 mg p.o. daily.  I do not think that she is having any response to the Plaquenil.  We had discussed this several times in the past that Plaquenil is not effective in the dermatomyositis.  The patient feels that Plaquenil could be causing some of her myalgias.  We will discontinue Plaquenil.  She had intolerance to Imuran in the past.  We had detailed discussion regarding starting methotrexate.  She would like to increase prednisone and would like to get off Plaquenil before starting on methotrexate.  She already has a handout on methotrexate and she has reviewed the side effects.  I plan to repeat her CK in 2 weeks and then will try to taper prednisone if tolerated.  I would also like to initiate methotrexate at follow-up visit.  Sjogren's syndrome with keratoconjunctivitis sicca ([place]): Positive ANA, positive Ro, positive anti-CCP, negative rheumatoid factor, SPEP negative.  She  continues to have some sicca symptoms but they are tolerable.  High risk medication use - PLQ 200 mg AM and 100 mg q PM, prednisone 10 mg po qdPLQ Eye Exam: 01/18/17 (intolerance to Imuran).  She will discontinue Plaquenil.  I will check TB Gold today.  She had SPEP, hepatitis panel, immunoglobulins in September 2017 which were all negative.  At that time the TB gold was indeterminate.  She had a PPD which was negative.  Primary osteoarthritis of both hands when she continues to have some stiffness.  Thymoma - She gets follow-up [facility].  He will return for follow-up appointment is in May.  Other fatigue: Improved  Former smoker    Orders: Orders Placed This Encounter  Procedures  . QuantiFERON-TB Gold Plus  . CK   Meds ordered this encounter  Medications  . predniSONE (DELTASONE) 10 MG tablet    Sig: Take 2 tablets (20 mg total) by mouth daily with breakfast.    Dispense:  60 tablet    Refill:  0    Face-to-face time spent with patient was 30 minutes.  Greater than 50% of time was spent in counseling and coordination of care.  Follow-Up Instructions: Return in about 3 months (around 10/06/2017) for Dermatomyositis, Sjogren's, Osteoarthritis.   Bo Merino, MD  Note - This record has been created using Editor, commissioning.  Chart creation errors have been sought, but may not always  have been located. Such creation errors do not reflect on  the standard of medical care.

## 2017-07-08 DIAGNOSIS — J9859 Other diseases of mediastinum, not elsewhere classified: Secondary | ICD-10-CM | POA: Diagnosis not present

## 2017-07-08 DIAGNOSIS — Z79899 Other long term (current) drug therapy: Secondary | ICD-10-CM | POA: Diagnosis not present

## 2017-07-08 DIAGNOSIS — M3390 Dermatopolymyositis, unspecified, organ involvement unspecified: Secondary | ICD-10-CM | POA: Diagnosis not present

## 2017-07-09 LAB — QUANTIFERON-TB GOLD PLUS
NIL: 0.01 [IU]/mL
QuantiFERON-TB Gold Plus: NEGATIVE
TB1-NIL: 0 IU/mL
TB2-NIL: 0 IU/mL

## 2017-07-09 NOTE — Progress Notes (Signed)
TB gold neg

## 2017-07-12 ENCOUNTER — Ambulatory Visit: Payer: Medicare Other | Attending: Rheumatology | Admitting: Physical Therapy

## 2017-07-12 ENCOUNTER — Encounter: Payer: Self-pay | Admitting: Physical Therapy

## 2017-07-12 DIAGNOSIS — M6281 Muscle weakness (generalized): Secondary | ICD-10-CM | POA: Diagnosis not present

## 2017-07-12 DIAGNOSIS — M79605 Pain in left leg: Secondary | ICD-10-CM | POA: Insufficient documentation

## 2017-07-12 DIAGNOSIS — R262 Difficulty in walking, not elsewhere classified: Secondary | ICD-10-CM | POA: Diagnosis not present

## 2017-07-12 DIAGNOSIS — M79604 Pain in right leg: Secondary | ICD-10-CM | POA: Diagnosis not present

## 2017-07-12 NOTE — Patient Instructions (Addendum)
  Pelvic Tilt    Tighten your stomach "brace yourself" and then slightly rotate pelvis and flatten back by tightening stomach muscles and buttocks. Slowly count to 5 out loud (to be sure you are breathing) Repeat __5__ times per set. Do _1___ sets per session. Do __1-2__ sessions per day.  http://orth.exer.us/135   Copyright  VHI. All rights reserved.  Access Code: [email or web address]  URL: [email or web address]  Date: 07/12/2017  Prepared by: Barry Brunner   Exercises  Hooklying Isometric Clamshell - 5 reps - 1-2 sets - 5 second hold - 1x daily - 7x weekly  Hooklying Isometric Hip Flexion - 5 reps - 1-2 sets - 5 seconds hold - 1x daily - 7x weekly  Isometric Shoulder External Rotation at Wall - 5 reps - 1-2 sets - 5 second hold - 1x daily - 7x weekly

## 2017-07-12 NOTE — Therapy (Signed)
[facility] [address] [place], [place], [address] Phone: [phone]   Fax:  [phone]  Physical Therapy Treatment  Patient Details  Name: Marisa Orozco MRN: [id number] Date of Birth: October 01, 1950 Referring Provider: Bo Merino, MD (Rheumatology)   Encounter Date: 07/12/2017  PT End of Session - 07/12/17 1718    Visit Number  2    Number of Visits  13    Date for PT Re-Evaluation  08/31/17    Authorization Type  Medicare, UHC    PT Start Time  1147    PT Stop Time  1239    PT Time Calculation (min)  52 min    Activity Tolerance  Patient tolerated treatment well    Behavior During Therapy  [facility] for tasks assessed/performed       Past Medical History:  Diagnosis Date  . Allergy   . Cancer ([place])    basal cell carcinoma - face  . 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 Former smoker 02/04/2016  . GERD (gastroesophageal reflux disease)    otc med prn  . PONV (postoperative nausea and vomiting)   . Sjogren's disease ([place]) 02/04/2016   Positive ANA, Positive Ro, Positive CCP, Negative RF, Parotitis, Positive Sicca  . Sjogren's syndrome ([place])    from MD note    Past Surgical History:  Procedure Laterality Date  . BREAST SURGERY     augmentation  . BUNIONECTOMY     right foot  . COLONOSCOPY    . DIAGNOSTIC LAPAROSCOPY    . DILATION AND CURETTAGE OF UTERUS  04/15/2011   Procedure: DILATATION AND CURETTAGE;  Surgeon: [name];  Location: [facility];  Service: Gynecology;  Laterality: N/A;  . MUSCLE BIOPSY Left 01/02/2016   Procedure: MUSCLE BIOPSY LEFT DELTOID;  Surgeon: [name], MD;  Location: [facility];  Service: General;  Laterality: Left;  left deltoid muscle bx  . NASAL SEPTUM SURGERY    . SVD     x 2  . TONSILLECTOMY    . WISDOM TOOTH EXTRACTION      There were no vitals filed for this visit.  Subjective Assessment  - 07/12/17 1710    Subjective  I can't believe what you are telling me. [facility] wants to be the premiere provider in this area and yet I have to drive to the next county to get aquatic therapy? Reports her current exercise routine is walking for ~30 minutes at a pace of 20 minutes/mile. She was trying to do 4x/week and backed down to 2x/week last week when labs came back with CK again elevated.     Pertinent History  prolonged use of Prednisone - is weaning down; thymoma, Sjogren's disease, basal cell carcinoma - face, OA both hands    Limitations  Walking;House hold activities    Patient Stated Goals  to exercise safely    Currently in Pain?  Yes    Pain Score  3     Pain Location  Shoulder    Pain Orientation  Right;Left    Pain Descriptors / Indicators  Aching;Sore    Pain Type  Chronic pain    Pain Onset  More than a month ago    Pain Frequency  Intermittent    Aggravating Factors   reaching up over shoulder height       Established basic HEP with isometric exercises, primarily focused on core and bil  hip musculature with one exercise for her shoulders. See below (all reps, sets completed as outlined).   Pelvic Tilt    Tighten your stomach "brace yourself" and then slightly rotate pelvis and flatten back by tightening stomach muscles and buttocks. Slowly count to 5 out loud (to be sure you are breathing) Repeat __5__ times per set. Do _1___ sets per session. Do __1-2__ sessions per day.  http://orth.exer.us/135   Copyright  VHI. All rights reserved.  Access Code: [email or web address]  URL: [email or web address]  Date: 07/12/2017  Prepared by: Barry Brunner   Exercises  Hooklying Isometric Clamshell - 5 reps - 1-2 sets - 5 second hold - 1x daily - 7x weekly  Hooklying Isometric Hip Flexion - 5 reps - 1-2 sets - 5 seconds hold - 1x daily - 7x weekly  Isometric Shoulder External Rotation at Wall - 5 reps - 1-2 sets - 5 second hold - 1x daily - 7x weekly                          PT Education - 07/12/17 1716    Education Details  process for referral for aquatic therapy at [facility]; no other aquatic therapy programs in [facility]; benefits of aquatic therapy, however must be cautious to not overtax her systems.     Person(s) Educated  Patient    Methods  Explanation    Comprehension  Verbalized understanding       PT Short Term Goals - 07/12/17 1735      PT SHORT TERM GOAL #1   Title  Pt will demonstrate independence with initial HEP/walking program    Time  4    Period  Weeks    Status  New      PT SHORT TERM GOAL #2   Title  Pt will improve functional LE strength with decreased five time sit to stand time to </= 12 seconds    Baseline  15 seconds    Time  4    Period  Weeks    Status  New      PT SHORT TERM GOAL #3   Title  Pt will improve MMT of proximal hip mm by 1/2 mm grade    Baseline  see eval flowsheet    Time  4    Period  Weeks    Status  New      PT SHORT TERM GOAL #4   Title  Pt will negotiate 8 stairs with one rail supervision with improved eccentric control    Time  4    Period  Weeks    Status  New      PT SHORT TERM GOAL #5   Title  Discuss referral to aquatic therapy program in [facility] [facility]) and facilitate referral if patient is agreeable.     Time  1    Period  Weeks    Status  Achieved        PT Long Term Goals - 07/12/17 1736      PT LONG TERM GOAL #1   Title  Pt will demonstrate independence with final gym/wellness HEP, walking program and aquatic exercise    Time  8    Period  Weeks    Status  New      PT LONG TERM GOAL #2   Title  Pt will improve functional LE strength as indicated by decrease in five time sit to stand time to </= 10 seconds  Time  8    Period  Weeks    Status  New      PT LONG TERM GOAL #3   Title  Pt will report being able to stand from bath tub (without going to hands and knees) independently at home and will perform floor > stand with  UE support MOD I    Time  8    Period  Weeks    Status  New      PT LONG TERM GOAL #4   Title  Pt will negotiate 8 stairs, no rails MOD I    Time  8    Period  Weeks    Status  New            Plan - 07/12/17 1721    Clinical Impression Statement  Session focused on discussion of how to progress with her CK levels conintuing to rise. Patient insists both her local physician and [facility] want her to pursue aquatic PT (especially as she tries to work through her flare-up). Process explained and although patient upset that she will have to travel [facility] to [facility], she ultimately was OK with the plan. Initiated gentle isometric exercises with pt educted to push betweeen 25-50% of her maximal effort when initiaing. Initiated her HEP. Patient can benefit from continued therapy. Will wait for her to be assessed by an aquatic PT to determine if her needs can best be met at another clinic.     Rehab Potential  Fair    Clinical Impairments Affecting Rehab Potential  active autoimmune disease    PT Frequency  Other (comment) 2x/week x 4 weeks, 1x/week x 4 weeks    PT Duration  8 weeks    PT Treatment/Interventions  ADLs/Self Care Home Management;Aquatic Therapy;Moist Heat;Gait training;Stair training;Functional mobility training;Therapeutic activities;Therapeutic exercise;Patient/family education;Passive range of motion;Energy conservation;Neuromuscular re-education;Balance training    PT Next Visit Plan  assess floor > stand and reset goal; check for understaning of HEP started 4/8 and how 4/9 appt with aquatic PT went; (determince frequency moving forward)     PT Home Exercise Plan  walking program, aquatic therapy?, gentle strengthening with isometric exercises progressing to low weight/low resistance; eccentric strengthening, balance    Consulted and Agree with Plan of Care  Patient       Patient will benefit from skilled therapeutic intervention in order to improve the following  deficits and impairments:  Decreased strength, Difficulty walking, Impaired UE functional use, Pain, Decreased balance  Visit Diagnosis: Muscle weakness (generalized)  Difficulty in walking, not elsewhere classified     Problem List Patient Active Problem List   Diagnosis Date Noted  . High risk medication use 04/07/2016  . Sjogren's disease ([place]) 02/04/2016  . 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 Former smoker 02/04/2016  . 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 Fatigue 02/15/2012    Rexanne Mano, PT 07/12/2017, 5:37 PM  [facility] [address] [place], [place], [address] Phone: [phone]   Fax:  [phone]  Name: Marisa Orozco MRN: [id number] Date of Birth: Oct 20, 1950

## 2017-07-13 ENCOUNTER — Ambulatory Visit: Payer: Medicare Other

## 2017-07-13 DIAGNOSIS — H2513 Age-related nuclear cataract, bilateral: Secondary | ICD-10-CM | POA: Diagnosis not present

## 2017-07-13 DIAGNOSIS — M3509 Sicca syndrome with other organ involvement: Secondary | ICD-10-CM | POA: Diagnosis not present

## 2017-07-13 DIAGNOSIS — Z79899 Other long term (current) drug therapy: Secondary | ICD-10-CM | POA: Diagnosis not present

## 2017-07-16 ENCOUNTER — Ambulatory Visit: Payer: Medicare Other | Admitting: Rheumatology

## 2017-07-16 DIAGNOSIS — M3390 Dermatopolymyositis, unspecified, organ involvement unspecified: Secondary | ICD-10-CM | POA: Diagnosis not present

## 2017-07-16 DIAGNOSIS — Z85828 Personal history of other malignant neoplasm of skin: Secondary | ICD-10-CM | POA: Diagnosis not present

## 2017-07-19 ENCOUNTER — Ambulatory Visit: Payer: Medicare Other | Admitting: Physical Therapy

## 2017-07-19 ENCOUNTER — Encounter: Payer: Self-pay | Admitting: Physical Therapy

## 2017-07-19 DIAGNOSIS — M6281 Muscle weakness (generalized): Secondary | ICD-10-CM

## 2017-07-19 DIAGNOSIS — R262 Difficulty in walking, not elsewhere classified: Secondary | ICD-10-CM | POA: Diagnosis not present

## 2017-07-19 DIAGNOSIS — M79605 Pain in left leg: Secondary | ICD-10-CM | POA: Diagnosis not present

## 2017-07-19 DIAGNOSIS — M79604 Pain in right leg: Secondary | ICD-10-CM | POA: Diagnosis not present

## 2017-07-19 NOTE — Therapy (Signed)
[facility] [address] [place], [place], [address] Phone: [phone]   Fax:  [phone]  Physical Therapy Treatment  Patient Details  Name: Marisa Orozco MRN: [id number] Date of Birth: October 10, 1950 Referring Provider: Bo Merino, MD (Rheumatology)   Encounter Date: 07/19/2017  PT End of Session - 07/19/17 1625    Visit Number  3    Number of Visits  13    Date for PT Re-Evaluation  08/31/17    Authorization Type  Medicare, UHC    PT Start Time  1017    PT Stop Time  1058    PT Time Calculation (min)  41 min    Activity Tolerance  Patient tolerated treatment well;No increased pain    Behavior During Therapy  WFL for tasks assessed/performed       Past Medical History:  Diagnosis Date  . Allergy   . Cancer ([place])    basal cell carcinoma - face  . 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 Former smoker 02/04/2016  . GERD (gastroesophageal reflux disease)    otc med prn  . PONV (postoperative nausea and vomiting)   . Sjogren's disease ([place]) 02/04/2016   Positive ANA, Positive Ro, Positive CCP, Negative RF, Parotitis, Positive Sicca  . Sjogren's syndrome ([place])    from MD note    Past Surgical History:  Procedure Laterality Date  . BREAST SURGERY     augmentation  . BUNIONECTOMY     right foot  . COLONOSCOPY    . DIAGNOSTIC LAPAROSCOPY    . DILATION AND CURETTAGE OF UTERUS  04/15/2011   Procedure: DILATATION AND CURETTAGE;  Surgeon: [name];  Location: [facility];  Service: Gynecology;  Laterality: N/A;  . MUSCLE BIOPSY Left 01/02/2016   Procedure: MUSCLE BIOPSY LEFT DELTOID;  Surgeon: [name], MD;  Location: [facility];  Service: General;  Laterality: Left;  left deltoid muscle bx  . NASAL SEPTUM SURGERY    . SVD     x 2  . TONSILLECTOMY    . WISDOM TOOTH EXTRACTION      There were no vitals filed for this  visit.  Subjective Assessment - 07/19/17 1023    Subjective  Cancelled her aquatic PT appointment due to woke up hurting all over and decided she should not go. She has re-scheduled. Today she is feeling sore all over. (Does not have labs drawn until Wednesday). Have been doing 10 reps of each exercise. Morning and evening (before bed)    Pertinent History  prolonged use of Prednisone - is weaning down; thymoma, Sjogren's disease, basal cell carcinoma - face, OA both hands    Limitations  Walking;House hold activities    Patient Stated Goals  to exercise safely    Currently in Pain?  Yes    Pain Score  4     Pain Location  Shoulder    Pain Orientation  Right;Left    Pain Descriptors / Indicators  Sore    Pain Type  Chronic pain    Pain Onset  More than a month ago    Pain Frequency  Intermittent        Treatment- Reviewed and completed 10 reps of each exercise given for HEP from last visit and additional visits from today. (see below)  Pelvic Tilt    Tighten your stomach "brace yourself" and then slightly rotate pelvis and flatten back by tightening  stomach muscles and buttocks. Slowly count to 5 out loud (to be sure you are breathing) Repeat __10__ times per set. Do _1___ sets per session. Do __1-2__ sessions per day.  http://[email or web address].exer.us/135   Copyright  VHI. All rights reserved.   Access Code: [email or web address]  URL: [email or web address]  Date: 07/19/2017  Prepared by: Barry Brunner   Exercises  Hooklying Isometric Clamshell - 5 reps - 1-2 sets - 5 second hold - 1x daily - 7x weekly  Hooklying Isometric Hip Flexion - 5 reps - 1-2 sets - 5 seconds hold - 1x daily - 7x weekly  Isometric Shoulder External Rotation at Wall - 5 reps - 1-2 sets - 5 second hold - 1x daily - 7x weekly  Shoulder Shrugs - 5-10 reps - 1-2 sets - 5 hold - 1x daily - 7x weekly  Supine Cervical Retraction with Towel - 5-10 reps - 1-2 sets - 5 hold - 1x daily - 7x weekly  Lying Isometric  cervical sidebending with manual resistance - 08-1008 reps - 1-2 sets - 5 hold - 1x daily - 7x weekly  Standing Isometric Shoulder Internal Rotation at Doorway - 08-1008 reps - 1-2 sets - 5 hold - 1x daily - 7x weekly                          PT Short Term Goals - 07/12/17 1735      PT SHORT TERM GOAL #1   Title  Pt will demonstrate independence with initial HEP/walking program    Time  4    Period  Weeks    Status  New      PT SHORT TERM GOAL #2   Title  Pt will improve functional LE strength with decreased five time sit to stand time to </= 12 seconds    Baseline  15 seconds    Time  4    Period  Weeks    Status  New      PT SHORT TERM GOAL #3   Title  Pt will improve MMT of proximal hip mm by 1/2 mm grade    Baseline  see eval flowsheet    Time  4    Period  Weeks    Status  New      PT SHORT TERM GOAL #4   Title  Pt will negotiate 8 stairs with one rail supervision with improved eccentric control    Time  4    Period  Weeks    Status  New      PT SHORT TERM GOAL #5   Title  Discuss referral to aquatic therapy program in [facility]) and facilitate referral if patient is agreeable.     Time  1    Period  Weeks    Status  Achieved        PT Long Term Goals - 07/12/17 1736      PT LONG TERM GOAL #1   Title  Pt will demonstrate independence with final gym/wellness HEP, walking program and aquatic exercise    Time  8    Period  Weeks    Status  New      PT LONG TERM GOAL #2   Title  Pt will improve functional LE strength as indicated by decrease in five time sit to stand time to </= 10 seconds    Time  8    Period  Weeks    Status  New  PT LONG TERM GOAL #3   Title  Pt will report being able to stand from bath tub (without going to hands and knees) independently at home and will perform floor > stand with UE support MOD I    Time  8    Period  Weeks    Status  New      PT LONG TERM GOAL #4   Title  Pt will negotiate 8 stairs,  no rails MOD I    Time  8    Period  Weeks    Status  New            Plan - 07/19/17 1628    Clinical Impression Statement  Patient did not want to cancel today's session despite feeling sore on arrival. Continued with isometric exercises only in light of active dermatomyositis flare (and primarily in supine). Focused on neck/shoulder muscles due to ?soreness from stress and how she slept. Reported she felt better by the end of the session. Noted she will have labs done 07/21/17 and pt said she could go there early in morning before coming for PT appointment with hopes that we will be able to see her CK results.     Rehab Potential  Fair    Clinical Impairments Affecting Rehab Potential  active autoimmune disease    PT Frequency  Other (comment) 2x/week x 4 weeks, 1x/week x 4 weeks    PT Duration  8 weeks    PT Treatment/Interventions  ADLs/Self Care Home Management;Aquatic Therapy;Moist Heat;Gait training;Stair training;Functional mobility training;Therapeutic activities;Therapeutic exercise;Patient/family education;Passive range of motion;Energy conservation;Neuromuscular re-education;Balance training    PT Next Visit Plan  check to see if newest lab results available for CK levels (if have increased more, recommend cancel PT); if proceeding with session, check for understaning of HEP ex's added 4/15; discuss her frequency/appts scheduled week when she scheduled aquatic PT session (5/2) because she now has 3 visits scheduled that week; assess floor > stand and reset goal;     PT Home Exercise Plan  walking program, aquatic therapy?, gentle strengthening with isometric exercises progressing to low weight/low resistance; eccentric strengthening, balance    Consulted and Agree with Plan of Care  Patient       Patient will benefit from skilled therapeutic intervention in order to improve the following deficits and impairments:  Decreased strength, Difficulty walking, Impaired UE functional use,  Pain, Decreased balance  Visit Diagnosis: Muscle weakness (generalized)     Problem List Patient Active Problem List   Diagnosis Date Noted  . High risk medication use 04/07/2016  . Sjogren's disease ([place]) 02/04/2016  . 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 Former smoker 02/04/2016  . 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 Fatigue 02/15/2012    Rexanne Mano , PT 07/19/2017, 4:40 PM  [facility] [address] [place], [place], [address] Phone: [phone]   Fax:  [phone]  Name: TANSY LOREK MRN: [id number] Date of Birth: [birth year]

## 2017-07-19 NOTE — Patient Instructions (Addendum)
Pelvic Tilt    Tighten your stomach "brace yourself" and then slightly rotate pelvis and flatten back by tightening stomach muscles and buttocks. Slowly count to 5 out loud (to be sure you are breathing) Repeat __10__ times per set. Do _1___ sets per session. Do __1-2__ sessions per day.  http://orth.exer.us/135   Copyright  VHI. All rights reserved.   Access Code: [email or web address]  URL: [email or web address]  Date: 07/19/2017  Prepared by: Barry Brunner   Exercises  Hooklying Isometric Clamshell - 5 reps - 1-2 sets - 5 second hold - 1x daily - 7x weekly  Hooklying Isometric Hip Flexion - 5 reps - 1-2 sets - 5 seconds hold - 1x daily - 7x weekly  Isometric Shoulder External Rotation at Wall - 5 reps - 1-2 sets - 5 second hold - 1x daily - 7x weekly  Shoulder Shrugs - 5-10 reps - 1-2 sets - 5 hold - 1x daily - 7x weekly  Supine Cervical Retraction with Towel - 5-10 reps - 1-2 sets - 5 hold - 1x daily - 7x weekly  Lying Isometric cervical sidebending with manual resistance - 08-1008 reps - 1-2 sets - 5 hold - 1x daily - 7x weekly  Standing Isometric Shoulder Internal Rotation at Doorway - 08-1008 reps - 1-2 sets - 5 hold - 1x daily - 7x weekly

## 2017-07-21 ENCOUNTER — Telehealth: Payer: Self-pay | Admitting: Rheumatology

## 2017-07-21 ENCOUNTER — Other Ambulatory Visit: Payer: Self-pay | Admitting: *Deleted

## 2017-07-21 DIAGNOSIS — M3390 Dermatopolymyositis, unspecified, organ involvement unspecified: Secondary | ICD-10-CM

## 2017-07-21 LAB — CK: Total CK: 233 U/L — ABNORMAL HIGH (ref 29–143)

## 2017-07-21 NOTE — Telephone Encounter (Signed)
Lab order released. 

## 2017-07-21 NOTE — Telephone Encounter (Signed)
Patient is at [facility] on [address] to get her bloodwork done.  Please fax the order to [phone]

## 2017-07-22 ENCOUNTER — Telehealth: Payer: Self-pay | Admitting: *Deleted

## 2017-07-22 MED ORDER — PREDNISONE 10 MG PO TABS: 40.0000 mg | ORAL_TABLET | Freq: Every day | ORAL | 0 refills | Status: DC

## 2017-07-22 NOTE — Telephone Encounter (Signed)
-----   Message from Bo Merino, MD sent at 07/22/2017  8:43 AM EDT ----- I discussed lab results with the patient.  We will increase her prednisone to 40 mg p.o. daily.  Please call in prescription for 10 mg tablets 4 tablets p.o. every morning.  We will check her CK in 2 weeks.  The plan is to start her on methotrexate a t her follow-up visit.

## 2017-07-22 NOTE — Telephone Encounter (Signed)
Prescription sent to the pharmacy.

## 2017-07-22 NOTE — Progress Notes (Signed)
I discussed lab results with the patient.  We will increase her prednisone to 40 mg p.o. daily.  Please call in prescription for 10 mg tablets 4 tablets p.o. every morning.  We will check her CK in 2 weeks.  The plan is to start her on methotrexate at her follow-up visit.

## 2017-07-26 NOTE — Progress Notes (Signed)
Office Visit Note  Patient: Marisa Orozco             Date of Birth: [birth year]           MRN: [id number]             PCP: Caryl Bis, MD Referring: Caryl Bis, MD Visit Date: 08/06/2017 Occupation: @GUAROCC @    Subjective:  Medication management.   History of Present Illness: Marisa Orozco is a 67 y.o. female with history of dermatomyositis, Sjogren's and osteoarthritis.  She states she started having increased pain and discomfort in September.  Her CK levels increased in March.  At that time we increased her prednisone to 20 mg a day.  As she continued to have elevated CK her prednisone was increased to 40 mg a day about 2 weeks ago.  She is feeling much better on increased dose of prednisone.  She also went for a second opinion at [facility] they were in agreement with the current plan and starting methotrexate.  She has been off Plaquenil as it was not effective.  She had intolerance to Imuran in the past.  She has occasional dry mouth issues.  She has some osteoarthritis in her hands which causes discomfort.  Activities of Daily Living:  Patient reports morning stiffness for 0 minute.   Patient Denies nocturnal pain.  Difficulty dressing/grooming: Denies Difficulty climbing stairs: Denies Difficulty getting out of chair: Denies Difficulty using hands for taps, buttons, cutlery, and/or writing: Denies   Review of Systems  Constitutional: Positive for fatigue. Negative for night sweats, weight gain and weight loss.  HENT: Positive for mouth dryness. Negative for mouth sores, trouble swallowing, trouble swallowing and nose dryness.   Eyes: Negative for pain, redness, visual disturbance and dryness.  Respiratory: Negative for cough, shortness of breath and difficulty breathing.   Cardiovascular: Negative for chest pain, palpitations, hypertension, irregular heartbeat and swelling in legs/feet.  Gastrointestinal: Negative for blood in stool, constipation and diarrhea.    Endocrine: Negative for increased urination.  Genitourinary: Negative for vaginal dryness.  Musculoskeletal: Negative for arthralgias, joint pain, joint swelling, myalgias, muscle weakness, morning stiffness, muscle tenderness and myalgias.  Skin: Negative for color change, rash, hair loss, skin tightness, ulcers and sensitivity to sunlight.  Allergic/Immunologic: Negative for susceptible to infections.  Neurological: Negative for dizziness, memory loss, night sweats and weakness.  Hematological: Negative for swollen glands.  Psychiatric/Behavioral: Positive for sleep disturbance. Negative for depressed mood. The patient is not nervous/anxious.     PMFS History:  Patient Active Problem List   Diagnosis Date Noted  . High risk medication use 04/07/2016  . Sjogren's disease ([place]) 02/04/2016  . 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 Former smoker 02/04/2016  . 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 Fatigue 02/15/2012    Past Medical History:  Diagnosis Date  . Allergy   . Cancer ([place])    basal cell carcinoma - face  . 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 Former smoker 02/04/2016  . GERD (gastroesophageal reflux disease)    otc med prn  . PONV (postoperative nausea and vomiting)   . Sjogren's disease ([place]) 02/04/2016   Positive ANA, Positive Ro, Positive CCP, Negative RF, Parotitis, Positive Sicca  . Sjogren's syndrome ([place])    from MD note    Family History  Problem Relation Age of Onset  . COPD Mother   . Heart disease Mother   . Diabetes Father   .  Kidney disease Father   . Epilepsy Daughter   . Colon cancer Neg Hx   . Esophageal cancer Neg Hx   . Stomach cancer Neg Hx   . Rectal cancer Neg Hx    Past Surgical History:  Procedure Laterality Date  . BREAST SURGERY     augmentation  . BUNIONECTOMY     right foot  . COLONOSCOPY    . DIAGNOSTIC LAPAROSCOPY    . DILATION AND CURETTAGE OF UTERUS  04/15/2011   Procedure: DILATATION  AND CURETTAGE;  Surgeon: [name];  Location: [facility];  Service: Gynecology;  Laterality: N/A;  . MUSCLE BIOPSY Left 01/02/2016   Procedure: MUSCLE BIOPSY LEFT DELTOID;  Surgeon: [name], MD;  Location: [facility];  Service: General;  Laterality: Left;  left deltoid muscle bx  . NASAL SEPTUM SURGERY    . SVD     x 2  . TONSILLECTOMY    . WISDOM TOOTH EXTRACTION     Social History   Social History Narrative  . Not on file     Objective: Vital Signs: BP 136/75 (BP Location: Left Arm, Patient Position: Sitting, Cuff Size: Normal)   Pulse 66   Resp 16   Ht 5\' 6"  (1.676 m)   Wt 168 lb (76.2 kg)   BMI 27.12 kg/m    Physical Exam  Constitutional: She is oriented to person, place, and time. She appears well-developed and well-nourished.  HENT:  Head: Normocephalic and atraumatic.  Eyes: Conjunctivae and EOM are normal.  Neck: Normal range of motion.  Cardiovascular: Normal rate, regular rhythm, normal heart sounds and intact distal pulses.  Pulmonary/Chest: Effort normal and breath sounds normal.  Abdominal: Soft. Bowel sounds are normal.  Lymphadenopathy:    She has no cervical adenopathy.  Neurological: She is alert and oriented to person, place, and time.  Skin: Skin is warm and dry. Capillary refill takes less than 2 seconds.  Psychiatric: She has a normal mood and affect. Her behavior is normal.  Nursing note and vitals reviewed.    Musculoskeletal Exam: Spine thoracic lumbar spine good range of motion.  Shoulder joints elbow joints wrist joint MCPs PIPs DIPs were in good range of motion.  Hip joints knee joints ankles MTPs PIPs with good range of motion with no synovitis.  She had good muscle strength 5/5 in both upper and lower extremities.  She was able to get up from the squatting position.  CDAI Exam: No CDAI exam completed.    Investigation: Findings:  September 2017 hepatitis panel negative, SPEP normal, TP MT normal, G6PD normal, IgM  low, HIV negative, PPD negative July 07, 2017 TB Gold negative PLQ eye exam: 01/18/2017  CBC Latest Ref Rng & Units 06/23/2017 04/12/2017 01/12/2017  WBC 3.8 - 10.8 Thousand/uL 12.8(H) 8.4 7.4  Hemoglobin 11.7 - 15.5 g/dL 14.1 14.3 15.6(H)  Hematocrit 35.0 - 45.0 % 42.7 42.5 47.1(H)  Platelets 140 - 400 Thousand/uL 317 215 187   CMP Latest Ref Rng & Units 06/23/2017 04/12/2017 01/12/2017  Glucose 65 - 99 mg/dL 100(H) 95 82  BUN 7 - 25 mg/dL 14 16 14   Creatinine 0.50 - 0.99 mg/dL 0.76 0.72 0.77  Sodium 135 - 146 mmol/L 139 141 142  Potassium 3.5 - 5.3 mmol/L 4.5 4.1 3.8  Chloride 98 - 110 mmol/L 104 106 105  CO2 20 - 32 mmol/L 28 27 30   Calcium 8.6 - 10.4 mg/dL 9.2 9.6 9.6  Total Protein 6.1 - 8.1 g/dL 7.2  7.0 7.1  Total Bilirubin 0.2 - 1.2 mg/dL 0.6 0.6 0.8  Alkaline Phos 33 - 130 U/L - - -  AST 10 - 35 U/L 26 15 14   ALT 6 - 29 U/L 20 14 14     Imaging: No results found.  Speciality Comments: PLQ Eye Exam: 01/18/17 WNL @ [facility]. Follow up 1 year.    Procedures:  No procedures performed Allergies: Codeine; Flagyl [metronidazole hcl]; Nexium [esomeprazole magnesium]; Imuran [azathioprine]; Sulfa antibiotics; and Sulfa drugs cross reactors   Assessment / Plan:     Visit Diagnoses: Dermatomyositis ([place]) - Jo 1+, positive muscle biopsy for dermatomyositis.  Patient is feeling much better on prednisone 40 mg a day.  Her CK is normal.  She also went to [facility] for second opinion and they were in agreement with starting her on methotrexate.  She had intolerance to Imuran in the past.  She tried Plaquenil which she believes caused her symptoms to get worse.  We had detailed discussion regarding use of methotrexate.  Indications side effects contraindications were discussed at length.  Handout was given and consent was taken.  The plan is to start her on methotrexate 0.4 mL subcu weekly for 2 weeks and then increase it by 0.2 mL every 2 weeks if labs are stable.  Her final dose will  be 0.8 mL subcu weekly along with folic acid 2 mg p.o. Daily.  Her labs will be monitored every 2 weeks x 3 and then every 2 months to monitor for drug toxicity.  Drug Counseling  Patient was counseled on the purpose, proper use, and adverse effects of methotrexate including nausea, infection, and signs and symptoms of pneumonitis.  Reviewed instructions with patient to take methotrexate weekly along with folic acid daily.  Discussed the importance of frequent monitoring of kidney and liver function and blood counts, and provided patient with standing lab instructions.  Counseled patient to avoid NSAIDs and alcohol while on methotrexate.  Provided patient with educational materials on methotrexate and answered all questions.  Advised patient to get annual influenza vaccine and to get a pneumococcal vaccine if patient has not already had one.  Patient voiced understanding.  Patient consented to methotrexate use.  Will upload into chart.    Sjogren's syndrome with keratoconjunctivitis sicca ([facility]) - Positive ANA, positive Ro, positive anti-CCP, negative rheumatoid factor, SPEP negative.  She has mild sicca symptoms.  High risk medication use -prednisone 40 mg p.o. daily.PLQ Eye Exam: 01/18/17 (intolerance to Imuran and inadequate response to  hydroxychloroquine)  Long-term use of systemic steroids-side effects of long-term use of steroids were discussed.  We will taper prednisone gradually once spread methotrexate gets into her system.  Screening for osteoporosis-patient reports that she had a DEXA scan at [facility] should bring the report next time.  Use of calcium and vitamin D was discussed.  At this point she does not want to start on any bisphosphonates.  Primary osteoarthritis of both hands-he has some stiffness in her hands.  Thymoma - She gets follow-up [facility].  He will return for follow-up appointment is in May.  Other fatigue-her fatigue is much better on increased dose of  prednisone.  Former smoker    Orders: Orders Placed This Encounter  Procedures  . COMPLETE METABOLIC PANEL WITH GFR  . CBC with Differential/Platelet  . COMPLETE METABOLIC PANEL WITH GFR   Meds ordered this encounter  Medications  . methotrexate 50 MG/2ML injection    Sig: Inject 0.4 mL for 2  weeks. Increase to 0.6 mL for 2 weeks. Increase to 0.8 mL for 2 weeks. Stay on this dose.    Dispense:  4 mL    Refill:  2    Please dispense 2 mL vials with preservative  . TUBERCULIN SYR 1CC/27GX1/2" 27G X 1/2" 1 ML MISC    Sig: Patient to use weekly to inject Methotrexate    Dispense:  12 each    Refill:  3  . folic acid (FOLVITE) 1 MG tablet    Sig: Take 2 tablets (2 mg total) by mouth daily.    Dispense:  180 tablet    Refill:  3    Face-to-face time spent with patient was 40 minutes. >50% of time was spent in counseling and coordination of care.  Follow-Up Instructions: Return for Dermatomyositis, Osteoarthritis.   Bo Merino, MD  Note - This record has been created using Editor, commissioning.  Chart creation errors have been sought, but may not always  have been located. Such creation errors do not reflect on  the standard of medical care.

## 2017-07-27 ENCOUNTER — Other Ambulatory Visit: Payer: Self-pay | Admitting: Thoracic Surgery (Cardiothoracic Vascular Surgery)

## 2017-07-27 ENCOUNTER — Ambulatory Visit: Payer: Medicare Other | Admitting: Physical Therapy

## 2017-07-27 ENCOUNTER — Encounter: Payer: Self-pay | Admitting: Physical Therapy

## 2017-07-27 DIAGNOSIS — R262 Difficulty in walking, not elsewhere classified: Secondary | ICD-10-CM

## 2017-07-27 DIAGNOSIS — M79605 Pain in left leg: Secondary | ICD-10-CM | POA: Diagnosis not present

## 2017-07-27 DIAGNOSIS — M79604 Pain in right leg: Secondary | ICD-10-CM | POA: Diagnosis not present

## 2017-07-27 DIAGNOSIS — M6281 Muscle weakness (generalized): Secondary | ICD-10-CM

## 2017-07-27 DIAGNOSIS — J9859 Other diseases of mediastinum, not elsewhere classified: Secondary | ICD-10-CM

## 2017-07-27 NOTE — Therapy (Signed)
[facility] [address] [place], [place], [address] Phone: [phone]   Fax:  [phone]  Physical Therapy Treatment  Patient Details  Name: Marisa Orozco MRN: [id number] Date of Birth: [birth year] Referring Provider: Bo Merino, MD (Rheumatology)   Encounter Date: 07/27/2017  PT End of Session - 07/27/17 1110    Visit Number  4    Number of Visits  13    Date for PT Re-Evaluation  08/31/17    Authorization Type  Medicare, UHC    PT Start Time  1020    PT Stop Time  1100    PT Time Calculation (min)  40 min    Activity Tolerance  Patient tolerated treatment well;No increased pain    Behavior During Therapy  WFL for tasks assessed/performed       Past Medical History:  Diagnosis Date  . Allergy   . Cancer ([place])    basal cell carcinoma - face  . 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 Former smoker 02/04/2016  . GERD (gastroesophageal reflux disease)    otc med prn  . PONV (postoperative nausea and vomiting)   . Sjogren's disease ([place]) 02/04/2016   Positive ANA, Positive Ro, Positive CCP, Negative RF, Parotitis, Positive Sicca  . Sjogren's syndrome ([place])    from MD note    Past Surgical History:  Procedure Laterality Date  . BREAST SURGERY     augmentation  . BUNIONECTOMY     right foot  . COLONOSCOPY    . DIAGNOSTIC LAPAROSCOPY    . DILATION AND CURETTAGE OF UTERUS  04/15/2011   Procedure: DILATATION AND CURETTAGE;  Surgeon: [name];  Location: [facility];  Service: Gynecology;  Laterality: N/A;  . MUSCLE BIOPSY Left 01/02/2016   Procedure: MUSCLE BIOPSY LEFT DELTOID;  Surgeon: [name], MD;  Location: [facility];  Service: General;  Laterality: Left;  left deltoid muscle bx  . NASAL SEPTUM SURGERY    . SVD     x 2  . TONSILLECTOMY    . WISDOM TOOTH EXTRACTION      There were no vitals filed for this  visit.  Subjective Assessment - 07/27/17 1026    Subjective  CK levels continue to lower but still elevated; increased Prednisone so pt is feeling better.  Going to discuss Methotrexate with physician.  May try functional medicine MD before Methotrexate.  Rescheduled aquatic assessment to 5/2.     Pertinent History  prolonged use of Prednisone - is weaning down; thymoma, Sjogren's disease, basal cell carcinoma - face, OA both hands    Limitations  Walking;House hold activities    Patient Stated Goals  to exercise safely    Currently in Pain?  No/denies      Add the following exercises to HEP; pt return demonstrated with mod cues for technique:   Stabilization: Diaphragmatic Breathing    Lie with knees bent, feet flat. Place one hand on stomach, other on chest. Breathe deeply through nose, lifting belly hand without any motion of hand on chest.  Breathe out through pursed lips - emptying lungs and contracting lower abdominals Repeat __10__ times per set. Do __2__ sets per session. Do __2__ sessions per week.   PELVIC STABILIZATION: Basic Bridge    Exhaling, lift hips. Inhaling, release hips back to floor. Repeat _10__ times. Do ___ times per day.  AFTER 10 REPETITIONS: ADD IN ARMS PRESSING DOWN  IN TO BED/FLOOR WHILE RAISING HIPS   Strengthening: Hip Abduction (Side-Lying)    LYING ON YOUR SIDE OR UP ON ELBOW (ELBOW UNDER SHOULDER, RIBS TUCKED) Tighten muscles on front of TOP thigh, then lift leg __6__ inches from surface, keeping knee STRAIGHT.   REST. Perform leg lift again and kick leg straight back x 10 repetitions.  Roll to other side and repeat each exercise.   Repeat __10__ times per set. Do _2___ sessions per day.     PT Education - 07/27/17 1107    Education provided  Yes    Education Details  diaphragmatic breathing to downregular nervous system; proximal hip strengthening with breathing    Person(s) Educated  Patient    Methods   Explanation;Demonstration;Handout    Comprehension  Verbalized understanding;Returned demonstration       PT Short Term Goals - 07/27/17 1119      PT SHORT TERM GOAL #1   Title  Pt will demonstrate independence with initial HEP/walking program  (All STG due 08/01/17)    Time  4    Period  Weeks    Status  New      PT SHORT TERM GOAL #2   Title  Pt will improve functional LE strength with decreased five time sit to stand time to </= 12 seconds    Baseline  15 seconds    Time  4    Period  Weeks    Status  New      PT SHORT TERM GOAL #3   Title  Pt will improve MMT of proximal hip mm by 1/2 mm grade    Baseline  see eval flowsheet    Time  4    Period  Weeks    Status  New      PT SHORT TERM GOAL #4   Title  Pt will negotiate 8 stairs with one rail supervision with improved eccentric control    Time  4    Period  Weeks    Status  New      PT SHORT TERM GOAL #5   Title  Discuss referral to aquatic therapy program in [facility]) and facilitate referral if patient is agreeable.     Time  1    Period  Weeks    Status  Achieved        PT Long Term Goals - 07/27/17 1120      PT LONG TERM GOAL #1   Title  Pt will demonstrate independence with final gym/wellness HEP, walking program and aquatic exercise  (All LTG 5/28)    Time  8    Period  Weeks    Status  New      PT LONG TERM GOAL #2   Title  Pt will improve functional LE strength as indicated by decrease in five time sit to stand time to </= 10 seconds    Time  8    Period  Weeks    Status  New      PT LONG TERM GOAL #3   Title  Pt will report being able to stand from bath tub (without going to hands and knees) independently at home and will perform floor > stand with UE support MOD I    Time  8    Period  Weeks    Status  New      PT LONG TERM GOAL #4   Title  Pt will negotiate 8 stairs, no rails MOD I  Time  8    Period  Weeks    Status  New            Plan - 07/27/17 1110    Clinical  Impression Statement  Patient's CK levels are decreasing with prednisone and pain has improved; pt feels she is able to tolerate continued therapy and exercises.  Educated pt on importance of diaphragmatic breathing for downregulation of the nervous system; incorporated breathing into proximal hip strengthening exercises for controlled, graded muscle activation and strengthening.  Strengthening exercises focused on proximal hip, proximal shoulder and core activation for stabilization.  Pt tolerated well with no increase in pain.  Will continue to progress as pt is able to tolerate while in active flare.    Rehab Potential  Fair    Clinical Impairments Affecting Rehab Potential  active autoimmune disease    PT Frequency  Other (comment) 2x/week x 4 weeks, 1x/week x 4 weeks    PT Duration  8 weeks    PT Treatment/Interventions  ADLs/Self Care Home Management;Aquatic Therapy;Moist Heat;Gait training;Stair training;Functional mobility training;Therapeutic activities;Therapeutic exercise;Patient/family education;Passive range of motion;Energy conservation;Neuromuscular re-education;Balance training    PT Next Visit Plan  check for understaning of HEP ex's added 4/15 and 4/23; discuss her frequency/appts scheduled week when she scheduled aquatic PT session (5/2) because she now has 3 visits scheduled that week; assess floor > stand and reset goal;     PT Home Exercise Plan  walking program, aquatic therapy?, gentle strengthening with isometric exercises progressing to low weight/low resistance of proximal mm, balance    Consulted and Agree with Plan of Care  Patient       Patient will benefit from skilled therapeutic intervention in order to improve the following deficits and impairments:  Decreased strength, Difficulty walking, Impaired UE functional use, Pain, Decreased balance  Visit Diagnosis: Muscle weakness (generalized)  Difficulty in walking, not elsewhere classified  Pain in left leg  Pain in  right leg     Problem List Patient Active Problem List   Diagnosis Date Noted  . High risk medication use 04/07/2016  . Sjogren's disease ([place]) 02/04/2016  . 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 Former smoker 02/04/2016  . 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 Fatigue 02/15/2012   Rico Junker, PT, DPT 07/27/17    11:21 AM    [facility] [address] [place], [place], [address] Phone: [phone]   Fax:  [phone]  Name: Marisa Orozco MRN: [id number] Date of Birth: 1950/04/14

## 2017-07-27 NOTE — Patient Instructions (Signed)
Stabilization: Diaphragmatic Breathing    Lie with knees bent, feet flat. Place one hand on stomach, other on chest. Breathe deeply through nose, lifting belly hand without any motion of hand on chest.  Breathe out through pursed lips - emptying lungs and contracting lower abdominals Repeat __10__ times per set. Do __2__ sets per session. Do __2__ sessions per week.   PELVIC STABILIZATION: Basic Bridge    Exhaling, lift hips. Inhaling, release hips back to floor. Repeat _10__ times. Do ___ times per day.  AFTER 10 REPETITIONS: ADD IN ARMS PRESSING DOWN IN TO BED/FLOOR WHILE RAISING HIPS   Strengthening: Hip Abduction (Side-Lying)    LYING ON YOUR SIDE OR UP ON ELBOW (ELBOW UNDER SHOULDER, RIBS TUCKED) Tighten muscles on front of TOP thigh, then lift leg __6__ inches from surface, keeping knee STRAIGHT.   REST. Perform leg lift again and kick leg straight back x 10 repetitions.  Roll to other side and repeat each exercise.   Repeat __10__ times per set. Do _2___ sessions per day.

## 2017-07-29 ENCOUNTER — Ambulatory Visit: Payer: Medicare Other | Admitting: Physical Therapy

## 2017-07-29 ENCOUNTER — Encounter: Payer: Self-pay | Admitting: Physical Therapy

## 2017-07-29 DIAGNOSIS — M79605 Pain in left leg: Secondary | ICD-10-CM | POA: Diagnosis not present

## 2017-07-29 DIAGNOSIS — M6281 Muscle weakness (generalized): Secondary | ICD-10-CM | POA: Diagnosis not present

## 2017-07-29 DIAGNOSIS — R262 Difficulty in walking, not elsewhere classified: Secondary | ICD-10-CM

## 2017-07-29 DIAGNOSIS — M79604 Pain in right leg: Secondary | ICD-10-CM | POA: Diagnosis not present

## 2017-07-29 NOTE — Therapy (Signed)
[facility] [address] [place], [place], [address] Phone: [phone]   Fax:  [phone]  Physical Therapy Treatment  Patient Details  Name: Marisa Orozco MRN: [id number] Date of Birth: 31-Dec-1950 Referring Provider: Bo Merino, MD (Rheumatology)   Encounter Date: 07/29/2017  PT End of Session - 07/29/17 1227    Visit Number  5    Number of Visits  13    Date for PT Re-Evaluation  08/31/17    Authorization Type  Medicare, UHC    PT Start Time  1022    PT Stop Time  1108    PT Time Calculation (min)  46 min    Activity Tolerance  Patient tolerated treatment well;No increased pain    Behavior During Therapy  WFL for tasks assessed/performed       Past Medical History:  Diagnosis Date  . Allergy   . Cancer ([place])    basal cell carcinoma - face  . 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 Former smoker 02/04/2016  . GERD (gastroesophageal reflux disease)    otc med prn  . PONV (postoperative nausea and vomiting)   . Sjogren's disease ([place]) 02/04/2016   Positive ANA, Positive Ro, Positive CCP, Negative RF, Parotitis, Positive Sicca  . Sjogren's syndrome ([place])    from MD note    Past Surgical History:  Procedure Laterality Date  . BREAST SURGERY     augmentation  . BUNIONECTOMY     right foot  . COLONOSCOPY    . DIAGNOSTIC LAPAROSCOPY    . DILATION AND CURETTAGE OF UTERUS  04/15/2011   Procedure: DILATATION AND CURETTAGE;  Surgeon: [name];  Location: [facility];  Service: Gynecology;  Laterality: N/A;  . MUSCLE BIOPSY Left 01/02/2016   Procedure: MUSCLE BIOPSY LEFT DELTOID;  Surgeon: [name], MD;  Location: [facility];  Service: General;  Laterality: Left;  left deltoid muscle bx  . NASAL SEPTUM SURGERY    . SVD     x 2  . TONSILLECTOMY    . WISDOM TOOTH EXTRACTION      There were no vitals filed for this  visit.  Subjective Assessment - 07/29/17 1023    Subjective  The exercises are going well.     Pertinent History  prolonged use of Prednisone - is weaning down; thymoma, Sjogren's disease, basal cell carcinoma - face, OA both hands    Limitations  Walking;House hold activities    Patient Stated Goals  to exercise safely    Currently in Pain?  No/denies                       [facility] Adult PT Treatment/Exercise - 07/29/17 1034      Exercises   Exercises  Shoulder;Lumbar      Lumbar Exercises: Seated   Long Arc Quad on [place]  Strengthening;Right;Left;1 set;10 reps    Hip Flexion on Ball  Strengthening;Left;Right;10 reps      Lumbar Exercises: Supine   Bridge  Compliant;10 reps    Bridge Limitations  hamstring bridge with heels on physioball    Bridge with March  Compliant;10 reps straight leg, alternate LE lift off ball    Large Ball Oblique Isometric  10 reps    Large Ball Oblique Isometric Limitations  LE on ball rotating ball to L and R      Shoulder Exercises: Seated  Other Seated Exercises  Seated on physioball with 2lb weights performed 10 reps each: posterior shoulder rolls, forward flexion to 90, forward flexion <> horizontal ABD, flexion + retraction<>protraction, shoulder extension, PNF diagonals D2 flexion<>extension.         Access Code: [email or web address]  URL: [email or web address]  Date: 08/03/2017  Prepared by: [place] March - 10 reps - 1 sets - 1x daily - 4x weekly  Swiss Ball March and Kick - 10 reps - 1 sets - 1x daily - 4x weekly  Bridge with Heels on [employer] - 10 reps - 1 sets - 1x daily - 4x weekly  Single Leg Bridge on [employer] - 10 reps - 1 sets - 1x daily - 4x weekly  Supine Lower Trunk Rotation with Swiss Ball - 10 reps - 1 sets - 1x daily - 4x weekly  Arm Circles on [employer] - 10 reps - 1 sets - 1x daily - 7x weekly  Alternating Shoulder Flexion Seated on Swiss Ball - 10 reps - 1 sets - 1x daily -  4x weekly  Shoulder Horizontal Abduction with Resistance on Swiss Ball - 10 reps - 1 sets - 1x daily - 4x weekly  Row with Anchored Resistance on [employer] - 10 reps - 1 sets - 1x daily - 4x weekly  Seated Shoulder Extension and Scapular Retraction with Resistance on Swiss Ball - 10 reps - 1 sets - 4x daily - 7x weekly  Seated Diagonals With Medicine Diona Foley on [employer] - 10 reps - 1 sets - 1x daily - 4x weekly       PT Education - 07/29/17 1227    Education provided  Yes    Education Details  physioball exercises    Person(s) Educated  Patient    Methods  Explanation;Demonstration    Comprehension  Verbalized understanding;Returned demonstration       PT Short Term Goals - 07/27/17 1119      PT SHORT TERM GOAL #1   Title  Pt will demonstrate independence with initial HEP/walking program  (All STG due 08/01/17)    Time  4    Period  Weeks    Status  New      PT SHORT TERM GOAL #2   Title  Pt will improve functional LE strength with decreased five time sit to stand time to </= 12 seconds    Baseline  15 seconds    Time  4    Period  Weeks    Status  New      PT SHORT TERM GOAL #3   Title  Pt will improve MMT of proximal hip mm by 1/2 mm grade    Baseline  see eval flowsheet    Time  4    Period  Weeks    Status  New      PT SHORT TERM GOAL #4   Title  Pt will negotiate 8 stairs with one rail supervision with improved eccentric control    Time  4    Period  Weeks    Status  New      PT SHORT TERM GOAL #5   Title  Discuss referral to aquatic therapy program in [facility]) and facilitate referral if patient is agreeable.     Time  1    Period  Weeks    Status  Achieved        PT Long Term Goals - 07/27/17 1120  PT LONG TERM GOAL #1   Title  Pt will demonstrate independence with final gym/wellness HEP, walking program and aquatic exercise  (All LTG 5/28)    Time  8    Period  Weeks    Status  New      PT LONG TERM GOAL #2   Title  Pt will improve  functional LE strength as indicated by decrease in five time sit to stand time to </= 10 seconds    Time  8    Period  Weeks    Status  New      PT LONG TERM GOAL #3   Title  Pt will report being able to stand from bath tub (without going to hands and knees) independently at home and will perform floor > stand with UE support MOD I    Time  8    Period  Weeks    Status  New      PT LONG TERM GOAL #4   Title  Pt will negotiate 8 stairs, no rails MOD I    Time  8    Period  Weeks    Status  New            Plan - 07/29/17 1227    Clinical Impression Statement  Pt tolerating HEP well with no increase in pain after performing.  Treatment session focused on addition of core and postural exercises seated on physioball while performing UE strengthening with light dumbells, LE strengthening on physioball and supine stability/core exercises with LE on physioball.  Pt tolerated well but continue to require verbal cues for breathing and sequencing.  One Neuro PT appointment for next week cancelled due to pt starting aquatic therapy next week.  Will continue to progress as pt is able to tolerate.    Rehab Potential  Fair    Clinical Impairments Affecting Rehab Potential  active autoimmune disease    PT Frequency  Other (comment) 2x/week x 4 weeks, 1x/week x 4 weeks    PT Duration  8 weeks    PT Treatment/Interventions  ADLs/Self Care Home Management;Aquatic Therapy;Moist Heat;Gait training;Stair training;Functional mobility training;Therapeutic activities;Therapeutic exercise;Patient/family education;Passive range of motion;Energy conservation;Neuromuscular re-education;Balance training    PT Next Visit Plan   assess floor > stand and reset goal; check STG; gentle yoga for balance.  quadruped exercises over ball    PT Home Exercise Plan  walking program, aquatic therapy?, gentle strengthening with isometric exercises progressing to low weight/low resistance of proximal mm, balance    Consulted  and Agree with Plan of Care  Patient       Patient will benefit from skilled therapeutic intervention in order to improve the following deficits and impairments:  Decreased strength, Difficulty walking, Impaired UE functional use, Pain, Decreased balance  Visit Diagnosis: Muscle weakness (generalized)  Difficulty in walking, not elsewhere classified  Pain in left leg  Pain in right leg     Problem List Patient Active Problem List   Diagnosis Date Noted  . High risk medication use 04/07/2016  . Sjogren's disease ([place]) 02/04/2016  . 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 Former smoker 02/04/2016  . 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 Fatigue 02/15/2012    Rico Junker, PT, DPT 08/03/17    3:24 PM    [facility] [address] [place], [place], [address] Phone: [phone]   Fax:  [phone]  Name: OKLA QAZI MRN: [id number] Date of Birth: Feb 06, 1951

## 2017-08-01 NOTE — Patient Instructions (Addendum)
Access Code: [email or web address]  URL: [email or web address]  Date: 08/03/2017  Prepared by: Misty Stanley   Exercises  Swiss Ball March - 10 reps - 1 sets - 1x daily - 7x weekly  Swiss Ball March and Kick - 10 reps - 1 sets - 1x daily - 4x weekly  Bridge with Heels on [employer] - 10 reps - 1 sets - 1x daily - 4x weekly  Single Leg Bridge on [employer] - 10 reps - 1 sets - 1x daily - 7x weekly  Supine Lower Trunk Rotation with Swiss Ball - 10 reps - 1 sets - 1x daily - 4x weekly  Arm Circles on [employer] - 10 reps - 1 sets - 1x daily - 7x weekly  Alternating Shoulder Flexion Seated on Swiss Ball - 10 reps - 1 sets - 1x daily - 7x weekly  Shoulder Horizontal Abduction with Resistance on Swiss Ball - 10 reps - 1 sets - 1x daily - 7x weekly  Row with Anchored Resistance on [employer] - 10 reps - 1 sets - 1x daily - 7x weekly  Seated Shoulder Extension and Scapular Retraction with Resistance on Swiss Ball - 10 reps - 1 sets - 1x daily - 7x weekly  Seated Diagonals With Medicine Diona Foley on [employer] - 10 reps - 1 sets - 1x daily - 7x weekly

## 2017-08-04 ENCOUNTER — Ambulatory Visit: Payer: Medicare Other | Attending: Rheumatology | Admitting: Physical Therapy

## 2017-08-04 ENCOUNTER — Telehealth: Payer: Self-pay | Admitting: Rheumatology

## 2017-08-04 ENCOUNTER — Encounter: Payer: Self-pay | Admitting: Physical Therapy

## 2017-08-04 ENCOUNTER — Other Ambulatory Visit: Payer: Self-pay | Admitting: *Deleted

## 2017-08-04 DIAGNOSIS — M3313 Other dermatomyositis without myopathy: Secondary | ICD-10-CM

## 2017-08-04 DIAGNOSIS — R262 Difficulty in walking, not elsewhere classified: Secondary | ICD-10-CM | POA: Diagnosis not present

## 2017-08-04 DIAGNOSIS — M79605 Pain in left leg: Secondary | ICD-10-CM | POA: Diagnosis not present

## 2017-08-04 DIAGNOSIS — M6281 Muscle weakness (generalized): Secondary | ICD-10-CM | POA: Diagnosis not present

## 2017-08-04 DIAGNOSIS — M3390 Dermatopolymyositis, unspecified, organ involvement unspecified: Secondary | ICD-10-CM

## 2017-08-04 DIAGNOSIS — M79604 Pain in right leg: Secondary | ICD-10-CM

## 2017-08-04 NOTE — Therapy (Signed)
[facility] [address] [place], [place], [address] Phone: [phone]   Fax:  [phone]  Physical Therapy Treatment  Patient Details  Name: Marisa Orozco MRN: [id number] Date of Birth: 07/10/1950 Referring Provider: Bo Merino, MD (Rheumatology)   Encounter Date: 08/04/2017  PT End of Session - 08/04/17 1541    Visit Number  6    Number of Visits  13    Date for PT Re-Evaluation  08/31/17    Authorization Type  Medicare, UHC    PT Start Time  1022    PT Stop Time  1100    PT Time Calculation (min)  38 min    Activity Tolerance  Patient tolerated treatment well;No increased pain    Behavior During Therapy  WFL for tasks assessed/performed       Past Medical History:  Diagnosis Date  . Allergy   . Cancer ([place])    basal cell carcinoma - face  . 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 Former smoker 02/04/2016  . GERD (gastroesophageal reflux disease)    otc med prn  . PONV (postoperative nausea and vomiting)   . Sjogren's disease ([place]) 02/04/2016   Positive ANA, Positive Ro, Positive CCP, Negative RF, Parotitis, Positive Sicca  . Sjogren's syndrome ([place])    from MD note    Past Surgical History:  Procedure Laterality Date  . BREAST SURGERY     augmentation  . BUNIONECTOMY     right foot  . COLONOSCOPY    . DIAGNOSTIC LAPAROSCOPY    . DILATION AND CURETTAGE OF UTERUS  04/15/2011   Procedure: DILATATION AND CURETTAGE;  Surgeon: [name];  Location: [facility];  Service: Gynecology;  Laterality: N/A;  . MUSCLE BIOPSY Left 01/02/2016   Procedure: MUSCLE BIOPSY LEFT DELTOID;  Surgeon: [name], MD;  Location: [facility];  Service: General;  Laterality: Left;  left deltoid muscle bx  . NASAL SEPTUM SURGERY    . SVD     x 2  . TONSILLECTOMY    . WISDOM TOOTH EXTRACTION      There were no vitals filed for this  visit.  Subjective Assessment - 08/04/17 1032    Subjective  Had a hard time stabilizing on ball at home; goes to aquatic therapy tomorrow.      Pertinent History  prolonged use of Prednisone - is weaning down; thymoma, Sjogren's disease, basal cell carcinoma - face, OA both hands    Limitations  Walking;House hold activities    Patient Stated Goals  to exercise safely    Currently in Pain?  No/denies         [facility] PT Assessment - 08/04/17 1035      ROM / Strength   AROM / PROM / Strength  Strength      Strength   Overall Strength  Deficits    Overall Strength Comments  Hip flexor: 4/5, hip ABD and hip extension 4+/5, hip IR/ER 5/5      Ambulation/Gait   Stairs  Yes    Stairs Assistance  6: Modified independent (Device/Increase time)    Stairs Assistance Details (indicate cue type and reason)  no rail today; improved eccentric control    Stair Management Technique  No rails;Alternating pattern;Forwards    Number of Stairs  8    Height of Stairs  6      Standardized Balance Assessment   Standardized  Balance Assessment  Five Times Sit to Stand    Five times sit to stand comments   12 seconds without UE support, improved eccentric control                   [facility] Adult PT Treatment/Exercise - 08/04/17 1035      Lumbar Exercises: Standing   Forward Lunge  10 reps R/LLE static lunge with UE support on physioball      Lumbar Exercises: Quadruped   Straight Leg Raise  10 reps hip extension<>flexion in quadruped    Other Quadruped Lumbar Exercises  quadruped LE arc taps to L and R, 5 reps each LE             PT Education - 08/04/17 1540    Education provided  Yes    Education Details  performed static lunges with physioball and UE/LE WB exercises in quadruped to assess tolerance - will add to HEP if pt tolerates    Person(s) Educated  Patient    Methods  Explanation    Comprehension  Verbalized understanding       PT Short Term Goals - 08/04/17 1033       PT SHORT TERM GOAL #1   Title  Pt will demonstrate independence with initial HEP/walking program  (All STG due 08/01/17)    Time  4    Period  Weeks    Status  Achieved      PT SHORT TERM GOAL #2   Title  Pt will improve functional LE strength with decreased five time sit to stand time to </= 12 seconds    Baseline  15 seconds > 12 seconds    Time  4    Period  Weeks    Status  Achieved      PT SHORT TERM GOAL #3   Title  Pt will improve MMT of proximal hip mm by 1/2 mm grade    Baseline  see eval flowsheet    Time  4    Period  Weeks    Status  Achieved      PT SHORT TERM GOAL #4   Title  Pt will negotiate 8 stairs with one rail supervision with improved eccentric control    Time  4    Period  Weeks    Status  Achieved      PT SHORT TERM GOAL #5   Title  Discuss referral to aquatic therapy program in [place] [facility]) and facilitate referral if patient is agreeable.     Time  1    Period  Weeks    Status  Achieved        PT Long Term Goals - 08/04/17 1043      PT LONG TERM GOAL #1   Title  Pt will demonstrate independence with final gym/wellness HEP, walking program and aquatic exercise  (All LTG 5/28)    Time  8    Period  Weeks    Status  New    Target Date  08/31/17      PT LONG TERM GOAL #2   Title  Pt will improve functional LE strength as indicated by decrease in five time sit to stand time to </= 10 seconds    Time  8    Period  Weeks    Status  New    Target Date  08/31/17      PT LONG TERM GOAL #3   Title  Pt will report being  able to stand from bath tub (without going to hands and knees) independently at home and will perform floor > stand with UE support MOD I    Time  8    Period  Weeks    Status  New    Target Date  08/31/17      PT LONG TERM GOAL #4   Title  Pt will negotiate 12 stairs, no rails MOD I    Time  8    Period  Weeks    Status  Revised    Target Date  08/31/17            Plan - 08/04/17 1542    Clinical Impression  Statement  Treatment session focused on assessment of LE strength and progress towards STG.  Despite patient in ongoing flare pt has experienced an improvement in LE strength as indicated by increase in Manual mm strength and decrease in five time sit to stand.  Pt has met all STG and will initiate aquatic therapy tomorrow.  Initiated quadruped exercises for proximal mm stabilization and strengthening - pt reported some wrist pain which resolved when pt cued to make hands into a fist.  Will continue to address and progress towards LTG with combination of aquatic and land based therapy.    Rehab Potential  Fair    Clinical Impairments Affecting Rehab Potential  active autoimmune disease    PT Frequency  Other (comment) 2x/week x 4 weeks, 1x/week x 4 weeks    PT Duration  8 weeks    PT Treatment/Interventions  ADLs/Self Care Home Management;Aquatic Therapy;Moist Heat;Gait training;Stair training;Functional mobility training;Therapeutic activities;Therapeutic exercise;Patient/family education;Passive range of motion;Energy conservation;Neuromuscular re-education;Balance training    PT Next Visit Plan  How was aquatic therapy?  decrease to 1x/week at neuro? assess floor > stand and reset goal; gentle yoga for balance.  add quadruped exercises to HEP?    PT Home Exercise Plan  walking program, aquatic therapy?, gentle strengthening with isometric exercises progressing to low weight/low resistance of proximal mm, balance    Consulted and Agree with Plan of Care  Patient       Patient will benefit from skilled therapeutic intervention in order to improve the following deficits and impairments:  Decreased strength, Difficulty walking, Impaired UE functional use, Pain, Decreased balance  Visit Diagnosis: Muscle weakness (generalized)  Difficulty in walking, not elsewhere classified  Pain in left leg  Pain in right leg     Problem List Patient Active Problem List   Diagnosis Date Noted  . High  risk medication use 04/07/2016  . Sjogren's disease ([place]) 02/04/2016  . 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 Former smoker 02/04/2016  . 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 Fatigue 02/15/2012    Rico Junker, PT, DPT 08/04/17    3:47 PM    [facility] [address] [place], [place], [address] Phone: [phone]   Fax:  [phone]  Name: Marisa Orozco MRN: [id number] Date of Birth: [birth year]

## 2017-08-04 NOTE — Telephone Encounter (Signed)
Lab orders released.  

## 2017-08-04 NOTE — Telephone Encounter (Signed)
Patient called stating she is at [facility] on [address] and needs her labwork orders sent to their office.

## 2017-08-05 ENCOUNTER — Other Ambulatory Visit: Payer: Self-pay

## 2017-08-05 ENCOUNTER — Ambulatory Visit: Payer: Medicare Other | Attending: Rheumatology

## 2017-08-05 DIAGNOSIS — M79604 Pain in right leg: Secondary | ICD-10-CM

## 2017-08-05 DIAGNOSIS — R262 Difficulty in walking, not elsewhere classified: Secondary | ICD-10-CM | POA: Diagnosis not present

## 2017-08-05 DIAGNOSIS — M6281 Muscle weakness (generalized): Secondary | ICD-10-CM | POA: Diagnosis not present

## 2017-08-05 DIAGNOSIS — M79605 Pain in left leg: Secondary | ICD-10-CM

## 2017-08-05 LAB — CK: CK TOTAL: 138 U/L (ref 29–143)

## 2017-08-05 NOTE — Therapy (Signed)
[place] SERVICES [address] [place], [place], [address] Phone: [phone]   Fax:  [phone]  Physical Therapy Treatment  Patient Details  Name: Marisa Orozco MRN: [id number] Date of Birth: 04/15/1950 Referring Provider: Bo Merino, MD (Rheumatology)   Encounter Date: 08/05/2017  PT End of Session - 08/05/17 1524    Visit Number  7    Number of Visits  13    Date for PT Re-Evaluation  08/31/17    Authorization Type  Medicare, UHC    PT Start Time  1115    PT Stop Time  1215    PT Time Calculation (min)  60 min    Activity Tolerance  Patient tolerated treatment well;No increased pain    Behavior During Therapy  WFL for tasks assessed/performed       Past Medical History:  Diagnosis Date  . Allergy   . Cancer ([place])    basal cell carcinoma - face  . 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 Former smoker 02/04/2016  . GERD (gastroesophageal reflux disease)    otc med prn  . PONV (postoperative nausea and vomiting)   . Sjogren's disease ([place]) 02/04/2016   Positive ANA, Positive Ro, Positive CCP, Negative RF, Parotitis, Positive Sicca  . Sjogren's syndrome ([place])    from MD note    Past Surgical History:  Procedure Laterality Date  . BREAST SURGERY     augmentation  . BUNIONECTOMY     right foot  . COLONOSCOPY    . DIAGNOSTIC LAPAROSCOPY    . DILATION AND CURETTAGE OF UTERUS  04/15/2011   Procedure: DILATATION AND CURETTAGE;  Surgeon: [name];  Location: [facility];  Service: Gynecology;  Laterality: N/A;  . MUSCLE BIOPSY Left 01/02/2016   Procedure: MUSCLE BIOPSY LEFT DELTOID;  Surgeon: [name], MD;  Location: [facility];  Service: General;  Laterality: Left;  left deltoid muscle bx  . NASAL SEPTUM SURGERY    . SVD     x 2  . TONSILLECTOMY    . WISDOM TOOTH EXTRACTION      There were no vitals filed for this  visit.  Subjective Assessment - 08/05/17 1520    Subjective  Pt reports general weakness with flare of autoimmune disorder that was associated with debilitating pain, but is improving slowly. Pt currently on Prednisone, which is controlling pain. Pt continues to note a lot of upper body, glut and core/hip flexor weakness.     Pertinent History  prolonged use of Prednisone - is weaning down; thymoma, Sjogren's disease, basal cell carcinoma - face, OA both hands         [facility] PT Assessment - 08/04/17 1035      ROM / Strength   AROM / PROM / Strength  Strength      Strength   Overall Strength  Deficits    Overall Strength Comments  Hip flexor: 4/5, hip ABD and hip extension 4+/5, hip IR/ER 5/5      Ambulation/Gait   Stairs  Yes    Stairs Assistance  6: Modified independent (Device/Increase time)    Stairs Assistance Details (indicate cue type and reason)  no rail today; improved eccentric control    Stair Management Technique  No rails;Alternating pattern;Forwards    Number of Stairs  8    Height of Stairs  6      Standardized Balance Assessment  Standardized Balance Assessment  Five Times Sit to Stand    Five times sit to stand comments   12 seconds without UE support, improved eccentric control      Enters/exits pool via ramp Participates in the following   Ambulation, blue dumbbells  4 L fwd  4 L side  Core with LE work  hip abd/add  hip flex/ext  Squats  Core with UE work, [employer], 2 x 10 ea  Triceps press downs  Sh abd/add  Sh flex/ext  Sh horiz abd/add  Bench, core/LE, 1# ankle weights, 2 x 10 ea  partial SKTC tuck, B Single SLR up and outs, B  Step ups, no UE support, lead R/L 20x ea  Modified planks at rail, 3 x 30 sec each  Fwd  Side             [facility] Adult PT Treatment/Exercise - 08/04/17 1035      Lumbar Exercises: Standing   Forward Lunge  10 reps R/LLE static lunge with UE support on physioball      Lumbar Exercises: Quadruped    Straight Leg Raise  10 reps hip extension<>flexion in quadruped    Other Quadruped Lumbar Exercises  quadruped LE arc taps to L and R, 5 reps each LE             PT Education - 08/05/17 1522    Education provided  Yes    Education Details  [employer] properties as it pertains to exercise/posture and benefits of aquatic PT. Core strengthening with UE, LE work. Concectric versus eccentric work, [employer]       PT Short Term Goals - 08/04/17 1033      PT SHORT TERM GOAL #1   Title  Pt will demonstrate independence with initial HEP/walking program  (All STG due 08/01/17)    Time  4    Period  Weeks    Status  Achieved      PT SHORT TERM GOAL #2   Title  Pt will improve functional LE strength with decreased five time sit to stand time to </= 12 seconds    Baseline  15 seconds > 12 seconds    Time  4    Period  Weeks    Status  Achieved      PT SHORT TERM GOAL #3   Title  Pt will improve MMT of proximal hip mm by 1/2 mm grade    Baseline  see eval flowsheet    Time  4    Period  Weeks    Status  Achieved      PT SHORT TERM GOAL #4   Title  Pt will negotiate 8 stairs with one rail supervision with improved eccentric control    Time  4    Period  Weeks    Status  Achieved      PT SHORT TERM GOAL #5   Title  Discuss referral to aquatic therapy program in [place] [facility]) and facilitate referral if patient is agreeable.     Time  1    Period  Weeks    Status  Achieved        PT Long Term Goals - 08/04/17 1043      PT LONG TERM GOAL #1   Title  Pt will demonstrate independence with final gym/wellness HEP, walking program and aquatic exercise  (All LTG 5/28)    Time  8    Period  Weeks    Status  New    Target Date  08/31/17      PT LONG TERM GOAL #2   Title  Pt will improve functional LE strength as indicated by decrease in five time sit to stand time to </= 10 seconds    Time  8    Period  Weeks    Status  New    Target Date  08/31/17      PT LONG TERM  GOAL #3   Title  Pt will report being able to stand from bath tub (without going to hands and knees) independently at home and will perform floor > stand with UE support MOD I    Time  8    Period  Weeks    Status  New    Target Date  08/31/17      PT LONG TERM GOAL #4   Title  Pt will negotiate 12 stairs, no rails MOD I    Time  8    Period  Weeks    Status  Revised    Target Date  08/31/17            Plan - 08/05/17 1525    Clinical Impression Statement  Pt tolerated session well with good understanding of concepts. Eager to work. Pt felt great ability to work well in the water without adverse affects. Pt requests continuing with all aquatic exercise at this time with follow ups in clinic for reassessment; will discuss with primary therapist.     Rehab Potential  Fair    Clinical Impairments Affecting Rehab Potential  active autoimmune disease    PT Frequency  Other (comment) 2x/week x 4 weeks, 1x/week x 4 weeks    PT Duration  8 weeks    PT Treatment/Interventions  ADLs/Self Care Home Management;Aquatic Therapy;Moist Heat;Gait training;Stair training;Functional mobility training;Therapeutic activities;Therapeutic exercise;Patient/family education;Passive range of motion;Energy conservation;Neuromuscular re-education;Balance training    PT Next Visit Plan  How was aquatic therapy?  decrease to 1x/week at neuro? assess floor > stand and reset goal; gentle yoga for balance.  add quadruped exercises to HEP?    PT Home Exercise Plan  walking program, aquatic therapy?, gentle strengthening with isometric exercises progressing to low weight/low resistance of proximal mm, balance    Consulted and Agree with Plan of Care  Patient       Patient will benefit from skilled therapeutic intervention in order to improve the following deficits and impairments:  Decreased strength, Difficulty walking, Impaired UE functional use, Pain, Decreased balance  Visit Diagnosis: Muscle weakness  (generalized)  Difficulty in walking, not elsewhere classified  Pain in left leg  Pain in right leg     Problem List Patient Active Problem List   Diagnosis Date Noted  . High risk medication use 04/07/2016  . Sjogren's disease ([place]) 02/04/2016  . 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 Former smoker 02/04/2016  . 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 Fatigue 02/15/2012    Larae Grooms 08/05/2017, 3:28 PM  [place] [facility] SERVICES [address] [place], [place], [address] Phone: [phone]   Fax:  [phone]  Name: KIYOMI PALLO MRN: [id number] Date of Birth: 01/08/1951

## 2017-08-06 ENCOUNTER — Encounter: Payer: Self-pay | Admitting: Rheumatology

## 2017-08-06 ENCOUNTER — Ambulatory Visit: Payer: Medicare Other | Admitting: Physical Therapy

## 2017-08-06 ENCOUNTER — Ambulatory Visit (INDEPENDENT_AMBULATORY_CARE_PROVIDER_SITE_OTHER): Payer: Medicare Other | Admitting: Rheumatology

## 2017-08-06 VITALS — BP 136/75 | HR 66 | Resp 16 | Ht 66.0 in | Wt 168.0 lb

## 2017-08-06 DIAGNOSIS — Z87891 Personal history of nicotine dependence: Secondary | ICD-10-CM | POA: Diagnosis not present

## 2017-08-06 DIAGNOSIS — R5383 Other fatigue: Secondary | ICD-10-CM

## 2017-08-06 DIAGNOSIS — Z7952 Long term (current) use of systemic steroids: Secondary | ICD-10-CM

## 2017-08-06 DIAGNOSIS — Z79899 Other long term (current) drug therapy: Secondary | ICD-10-CM

## 2017-08-06 DIAGNOSIS — M3501 Sicca syndrome with keratoconjunctivitis: Secondary | ICD-10-CM

## 2017-08-06 DIAGNOSIS — D150 Benign neoplasm of thymus: Secondary | ICD-10-CM | POA: Diagnosis not present

## 2017-08-06 DIAGNOSIS — Z1231 Encounter for screening mammogram for malignant neoplasm of breast: Secondary | ICD-10-CM | POA: Diagnosis not present

## 2017-08-06 DIAGNOSIS — M19041 Primary osteoarthritis, right hand: Secondary | ICD-10-CM | POA: Diagnosis not present

## 2017-08-06 DIAGNOSIS — M3390 Dermatopolymyositis, unspecified, organ involvement unspecified: Secondary | ICD-10-CM | POA: Diagnosis not present

## 2017-08-06 DIAGNOSIS — M19042 Primary osteoarthritis, left hand: Secondary | ICD-10-CM

## 2017-08-06 DIAGNOSIS — D4989 Neoplasm of unspecified behavior of other specified sites: Secondary | ICD-10-CM

## 2017-08-06 MED ORDER — METHOTREXATE SODIUM CHEMO INJECTION 50 MG/2ML: INTRAMUSCULAR | 2 refills | Status: DC

## 2017-08-06 MED ORDER — FOLIC ACID 1 MG PO TABS: 2.0000 mg | ORAL_TABLET | Freq: Every day | ORAL | 3 refills | Status: DC

## 2017-08-06 MED ORDER — "TUBERCULIN SYRINGE 27G X 1/2"" 1 ML MISC": 3 refills | Status: DC

## 2017-08-06 NOTE — Patient Instructions (Signed)
Standing Labs We placed an order today for your standing lab work.    Please come back and get your standing labs in 2 weeks x 3 and then every 2 months  We have open lab Monday through Friday from 8:30-11:30 AM and 1:30-4:00 PM  at the office of Dr. Bo Merino.   You may experience shorter wait times on Monday and Friday afternoons. The office is located at [address] [place], [place],  [address] No appointment is necessary.   Labs are drawn by [facility].  You may receive a bill from [facility] for your lab work. If you have any questions regarding directions or hours of operation,  please call [phone].       Methotrexate tablets What is this medicine? METHOTREXATE (METH oh TREX ate) is a chemotherapy drug used to treat cancer including breast cancer, leukemia, and lymphoma. This medicine can also be used to treat psoriasis and certain kinds of arthritis. This medicine may be used for other purposes; ask your health care provider or pharmacist if you have questions. COMMON BRAND NAME(S): Rheumatrex, Trexall What should I tell my health care provider before I take this medicine? They need to know if you have any of these conditions: -fluid in the stomach area or lungs -if you often drink alcohol -infection or immune system problems -kidney disease or on hemodialysis -liver disease -low blood counts, like low white cell, platelet, or red cell counts -lung disease -radiation therapy -stomach ulcers -ulcerative colitis -an unusual or allergic reaction to methotrexate, other medicines, foods, dyes, or preservatives -pregnant or trying to get pregnant -breast-feeding How should I use this medicine? Take this medicine by mouth with a glass of water. Follow the directions on the prescription label. Take your medicine at regular intervals. Do not take it more often than directed. Do not stop taking except on your doctor's advice. Make sure you know why you are taking  this medicine and how often you should take it. If this medicine is used for a condition that is not cancer, like arthritis or psoriasis, it should be taken weekly, NOT daily. Taking this medicine more often than directed can cause serious side effects, even death. Talk to your healthcare provider about safe handling and disposal of this medicine. You may need to take special precautions. Talk to your pediatrician regarding the use of this medicine in children. While this drug may be prescribed for selected conditions, precautions do apply. Overdosage: If you think you have taken too much of this medicine contact a poison control center or emergency room at once. NOTE: This medicine is only for you. Do not share this medicine with others. What if I miss a dose? If you miss a dose, talk with your doctor or health care professional. Do not take double or extra doses. What may interact with this medicine? This medicine may interact with the following medication: -acitretin -aspirin and aspirin-like medicines including salicylates -azathioprine -certain antibiotics like penicillins, tetracycline, and chloramphenicol -cyclosporine -gold -hydroxychloroquine -live virus vaccines -NSAIDs, medicines for pain and inflammation, like ibuprofen or naproxen -other cytotoxic agents -penicillamine -phenylbutazone -phenytoin -probenecid -retinoids such as isotretinoin and tretinoin -steroid medicines like prednisone or cortisone -sulfonamides like sulfasalazine and trimethoprim/sulfamethoxazole -theophylline This list may not describe all possible interactions. Give your health care provider a list of all the medicines, herbs, non-prescription drugs, or dietary supplements you use. Also tell them if you smoke, drink alcohol, or use illegal drugs. Some items may interact with your medicine. What  should I watch for while using this medicine? Avoid alcoholic drinks. This medicine can make you more  sensitive to the sun. Keep out of the sun. If you cannot avoid being in the sun, wear protective clothing and use sunscreen. Do not use sun lamps or tanning beds/booths. You may need blood work done while you are taking this medicine. Call your doctor or health care professional for advice if you get a fever, chills or sore throat, or other symptoms of a cold or flu. Do not treat yourself. This drug decreases your body's ability to fight infections. Try to avoid being around people who are sick. This medicine may increase your risk to bruise or bleed. Call your doctor or health care professional if you notice any unusual bleeding. Check with your doctor or health care professional if you get an attack of severe diarrhea, nausea and vomiting, or if you sweat a lot. The loss of too much body fluid can make it dangerous for you to take this medicine. Talk to your doctor about your risk of cancer. You may be more at risk for certain types of cancers if you take this medicine. Both men and women must use effective birth control with this medicine. Do not become pregnant while taking this medicine or until at least 1 normal menstrual cycle has occurred after stopping it. Women should inform their doctor if they wish to become pregnant or think they might be pregnant. Men should not father a child while taking this medicine and for 3 months after stopping it. There is a potential for serious side effects to an unborn child. Talk to your health care professional or pharmacist for more information. Do not breast-feed an infant while taking this medicine. What side effects may I notice from receiving this medicine? Side effects that you should report to your doctor or health care professional as soon as possible: -allergic reactions like skin rash, itching or hives, swelling of the face, lips, or tongue -breathing problems or shortness of breath -diarrhea -dry, nonproductive cough -low blood counts - this  medicine may decrease the number of white blood cells, red blood cells and platelets. You may be at increased risk for infections and bleeding. -mouth sores -redness, blistering, peeling or loosening of the skin, including inside the mouth -signs of infection - fever or chills, cough, sore throat, pain or trouble passing urine -signs and symptoms of bleeding such as bloody or black, tarry stools; red or dark-brown urine; spitting up blood or brown material that looks like coffee grounds; red spots on the skin; unusual bruising or bleeding from the eye, gums, or nose -signs and symptoms of kidney injury like trouble passing urine or change in the amount of urine -signs and symptoms of liver injury like dark yellow or brown urine; general ill feeling or flu-like symptoms; light-colored stools; loss of appetite; nausea; right upper belly pain; unusually weak or tired; yellowing of the eyes or skin Side effects that usually do not require medical attention (report to your doctor or health care professional if they continue or are bothersome): -dizziness -hair loss -tiredness -upset stomach -vomiting This list may not describe all possible side effects. Call your doctor for medical advice about side effects. You may report side effects to FDA at 1-800-FDA-1088. Where should I keep my medicine? Keep out of the reach of children. Store at room temperature between 20 and 25 degrees C (68 and 77 degrees F). Protect from light. Throw away any unused medicine after  the expiration date. NOTE: This sheet is a summary. It may not cover all possible information. If you have questions about this medicine, talk to your doctor, pharmacist, or health care provider.  2018 Elsevier/Gold Standard (2014-11-26 05:39:22)

## 2017-08-07 LAB — COMPLETE METABOLIC PANEL WITH GFR
AG Ratio: 1.5 (calc) (ref 1.0–2.5)
ALT: 20 U/L (ref 6–29)
AST: 18 U/L (ref 10–35)
Albumin: 4.1 g/dL (ref 3.6–5.1)
Alkaline phosphatase (APISO): 50 U/L (ref 33–130)
BILIRUBIN TOTAL: 0.6 mg/dL (ref 0.2–1.2)
BUN: 22 mg/dL (ref 7–25)
CALCIUM: 9.5 mg/dL (ref 8.6–10.4)
CHLORIDE: 106 mmol/L (ref 98–110)
CO2: 30 mmol/L (ref 20–32)
Creat: 0.82 mg/dL (ref 0.50–0.99)
GFR, EST AFRICAN AMERICAN: 86 mL/min/{1.73_m2} (ref >=60)
GFR, EST NON AFRICAN AMERICAN: 75 mL/min/{1.73_m2} (ref >=60)
Globulin: 2.7 g/dL (calc) (ref 1.9–3.7)
Glucose, Bld: 92 mg/dL (ref 65–99)
POTASSIUM: 3.8 mmol/L (ref 3.5–5.3)
Sodium: 144 mmol/L (ref 135–146)
TOTAL PROTEIN: 6.8 g/dL (ref 6.1–8.1)

## 2017-08-09 ENCOUNTER — Encounter: Payer: Self-pay | Admitting: Physical Therapy

## 2017-08-09 ENCOUNTER — Ambulatory Visit: Payer: Medicare Other | Admitting: Physical Therapy

## 2017-08-09 DIAGNOSIS — M6281 Muscle weakness (generalized): Secondary | ICD-10-CM

## 2017-08-09 DIAGNOSIS — M79604 Pain in right leg: Secondary | ICD-10-CM | POA: Diagnosis not present

## 2017-08-09 DIAGNOSIS — M79605 Pain in left leg: Secondary | ICD-10-CM | POA: Diagnosis not present

## 2017-08-09 DIAGNOSIS — R262 Difficulty in walking, not elsewhere classified: Secondary | ICD-10-CM | POA: Diagnosis not present

## 2017-08-09 NOTE — Patient Instructions (Signed)
Quads / HF, Lunge    Step forwards with left leg into a lunge, keeping knee over ankle. Lower back knee down to floor as pictured below:    Hold for 2-3 seconds keeping your balance, raise back knee back off the floor and step to meet left leg. Hold side of couch or bed for support if needed. Step forwards with right leg and repeat.  Perform 2-3 times each leg.

## 2017-08-09 NOTE — Therapy (Signed)
[facility] [address] [place], [place], [address] Phone: [phone]   Fax:  [phone]  Physical Therapy Treatment  Patient Details  Name: Marisa Orozco MRN: [id number] Date of Birth: 02/27/1951 Referring Provider: Bo Merino, MD (Rheumatology)   Encounter Date: 08/09/2017  PT End of Session - 08/09/17 2101    Visit Number  8    Number of Visits  13    Date for PT Re-Evaluation  08/31/17    Authorization Type  Medicare, UHC    PT Start Time  1408    PT Stop Time  1445    PT Time Calculation (min)  37 min    Activity Tolerance  Patient tolerated treatment well    Behavior During Therapy  [facility] for tasks assessed/performed       Past Medical History:  Diagnosis Date  . Allergy   . Cancer ([place])    basal cell carcinoma - face  . 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 Former smoker 02/04/2016  . GERD (gastroesophageal reflux disease)    otc med prn  . PONV (postoperative nausea and vomiting)   . Sjogren's disease ([place]) 02/04/2016   Positive ANA, Positive Ro, Positive CCP, Negative RF, Parotitis, Positive Sicca  . Sjogren's syndrome ([place])    from MD note    Past Surgical History:  Procedure Laterality Date  . BREAST SURGERY     augmentation  . BUNIONECTOMY     right foot  . COLONOSCOPY    . DIAGNOSTIC LAPAROSCOPY    . DILATION AND CURETTAGE OF UTERUS  04/15/2011   Procedure: DILATATION AND CURETTAGE;  Surgeon: [name];  Location: [facility];  Service: Gynecology;  Laterality: N/A;  . MUSCLE BIOPSY Left 01/02/2016   Procedure: MUSCLE BIOPSY LEFT DELTOID;  Surgeon: [name], MD;  Location: [facility];  Service: General;  Laterality: Left;  left deltoid muscle bx  . NASAL SEPTUM SURGERY    . SVD     x 2  . TONSILLECTOMY    . WISDOM TOOTH EXTRACTION      There were no vitals filed for this  visit.                    [facility] Adult PT Treatment/Exercise - 08/09/17 1434      Transfers   Transfers  Floor to Transfer    Floor to Transfer  6: Modified independent (Device/Increase time)    Comments  performs stand <> sit <> floor mod I with use of UE support on mat and coming into half kneeling > sit back on mat.  Practiced transitioning from standing <> high lunge <> low lunge on R and LE for proximal hip stability and strength training to improve proximal hip strength for floor/tub <> stand transfer      Therapeutic Activites    Therapeutic Activities  Other Therapeutic Activities    Other Therapeutic Activities  Discussed pt's aquatic PT experience and benefits of aquatics especially during a flare.  Also discussed the importance of transitioning to a community wellness program that pt felt she would be able to maintain. Pt is currently not a member at a pool but will be looking into membership at [facility] or [employer] to continue aquatic exercises.  Pt will also benefit from intermittent land based therapy sessions to also incorporate land based exercises to wellness program.  Pt would like  to continue with aquatic therapy 2x/week and return to neuro PT for re-assessment.             PT Education - 08/09/17 2100    Education provided  Yes    Education Details  plan for remainder of visits, floor <> stand transfer and exercises    Person(s) Educated  Patient    Methods  Explanation;Demonstration    Comprehension  Verbalized understanding;Returned demonstration       PT Short Term Goals - 08/04/17 1033      PT SHORT TERM GOAL #1   Title  Pt will demonstrate independence with initial HEP/walking program  (All STG due 08/01/17)    Time  4    Period  Weeks    Status  Achieved      PT SHORT TERM GOAL #2   Title  Pt will improve functional LE strength with decreased five time sit to stand time to </= 12 seconds    Baseline  15 seconds > 12 seconds    Time   4    Period  Weeks    Status  Achieved      PT SHORT TERM GOAL #3   Title  Pt will improve MMT of proximal hip mm by 1/2 mm grade    Baseline  see eval flowsheet    Time  4    Period  Weeks    Status  Achieved      PT SHORT TERM GOAL #4   Title  Pt will negotiate 8 stairs with one rail supervision with improved eccentric control    Time  4    Period  Weeks    Status  Achieved      PT SHORT TERM GOAL #5   Title  Discuss referral to aquatic therapy program in [place] [facility]) and facilitate referral if patient is agreeable.     Time  1    Period  Weeks    Status  Achieved        PT Long Term Goals - 08/04/17 1043      PT LONG TERM GOAL #1   Title  Pt will demonstrate independence with final gym/wellness HEP, walking program and aquatic exercise  (All LTG 5/28)    Time  8    Period  Weeks    Status  New    Target Date  08/31/17      PT LONG TERM GOAL #2   Title  Pt will improve functional LE strength as indicated by decrease in five time sit to stand time to </= 10 seconds    Time  8    Period  Weeks    Status  New    Target Date  08/31/17      PT LONG TERM GOAL #3   Title  Pt will report being able to stand from bath tub (without going to hands and knees) independently at home and will perform floor > stand with UE support MOD I    Time  8    Period  Weeks    Status  New    Target Date  08/31/17      PT LONG TERM GOAL #4   Title  Pt will negotiate 12 stairs, no rails MOD I    Time  8    Period  Weeks    Status  Revised    Target Date  08/31/17            Plan -  08/09/17 2102    Clinical Impression Statement  Pt returns today to discuss aquatic PT and plan for future visits.  Pt would like to continue with aquatic PT and return to Neuro PT for re-assessment and then transition to community wellness program with both aquatic and land based exercises for HEP.  Will f/u with aquatic therapist to confirm plan.    Rehab Potential  Fair    Clinical  Impairments Affecting Rehab Potential  active autoimmune disease    PT Frequency  Other (comment) 2x/week x 4 weeks, 1x/week x 4 weeks    PT Duration  8 weeks    PT Treatment/Interventions  ADLs/Self Care Home Management;Aquatic Therapy;Moist Heat;Gait training;Stair training;Functional mobility training;Therapeutic activities;Therapeutic exercise;Patient/family education;Passive range of motion;Energy conservation;Neuromuscular re-education;Balance training    PT Next Visit Plan  Aquatic.   Neuro: continue proximal hip strengthening -gentle yoga/pilates, floor <> stand, tall kneeling <> half kneeling, standing balance - tandem, SLS    PT Home Exercise Plan  walking program, aquatic therapy, gentle strengthening with isometric exercises progressing to low weight/low resistance of proximal mm, balance    Consulted and Agree with Plan of Care  Patient       Patient will benefit from skilled therapeutic intervention in order to improve the following deficits and impairments:  Decreased strength, Difficulty walking, Impaired UE functional use, Pain, Decreased balance  Visit Diagnosis: Muscle weakness (generalized)  Difficulty in walking, not elsewhere classified  Pain in left leg  Pain in right leg     Problem List Patient Active Problem List   Diagnosis Date Noted  . High risk medication use 04/07/2016  . Sjogren's disease ([place]) 02/04/2016  . 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 Former smoker 02/04/2016  . 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 Fatigue 02/15/2012    Rico Junker, PT, DPT 08/09/17    9:09 PM    [facility] [address] [place], [place], [address] Phone: [phone]   Fax:  [phone]  Name: TEJASVI BRISSETT MRN: [id number] Date of Birth: 24-Jan-1951

## 2017-08-16 ENCOUNTER — Ambulatory Visit: Payer: Medicare Other | Admitting: Physical Therapy

## 2017-08-16 DIAGNOSIS — R262 Difficulty in walking, not elsewhere classified: Secondary | ICD-10-CM

## 2017-08-16 DIAGNOSIS — M6281 Muscle weakness (generalized): Secondary | ICD-10-CM | POA: Diagnosis not present

## 2017-08-16 DIAGNOSIS — M79604 Pain in right leg: Secondary | ICD-10-CM | POA: Diagnosis not present

## 2017-08-16 DIAGNOSIS — M79605 Pain in left leg: Secondary | ICD-10-CM

## 2017-08-16 NOTE — Patient Instructions (Addendum)
Access Code: [email or web address]  URL: [email or web address]   Tandem Stance on Foam Pad with Eyes Closed - 20 reps - 4 sets - 1x daily - 5x weekly   Single Leg Stance on Foam Pad - 2 sets - 10 hold - 1x daily - 5x weekly   Braided Sidestepping - 10 reps - 4 sets - 1x daily - 5x weekly   Standing with Feet Together on Foam Pad - 20 reps - 4 sets - 1x daily - 5x weekly   Runner's Step up with Arms Forward - 10 reps - 1x daily - 5x weekly   Lateral Step Up with Counter Support - 10 reps - 1x daily - 5x weekly

## 2017-08-16 NOTE — Therapy (Signed)
[facility] [address] [place], [place], [address] Phone: [phone]   Fax:  [phone]  Physical Therapy Treatment  Patient Details  Name: Marisa Orozco MRN: [id number] Date of Birth: 1950-10-26 Referring Provider: Bo Merino, MD (Rheumatology)   Encounter Date: 08/16/2017  PT End of Session - 08/16/17 1338    Visit Number  9    Number of Visits  13    Date for PT Re-Evaluation  08/31/17    Authorization Type  Medicare, UHC    PT Start Time  1021    PT Stop Time  1102    PT Time Calculation (min)  41 min    Activity Tolerance  Patient tolerated treatment well    Behavior During Therapy  [facility] for tasks assessed/performed       Past Medical History:  Diagnosis Date  . Allergy   . Cancer ([place])    basal cell carcinoma - face  . 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 Former smoker 02/04/2016  . GERD (gastroesophageal reflux disease)    otc med prn  . PONV (postoperative nausea and vomiting)   . Sjogren's disease ([place]) 02/04/2016   Positive ANA, Positive Ro, Positive CCP, Negative RF, Parotitis, Positive Sicca  . Sjogren's syndrome ([place])    from MD note    Past Surgical History:  Procedure Laterality Date  . BREAST SURGERY     augmentation  . BUNIONECTOMY     right foot  . COLONOSCOPY    . DIAGNOSTIC LAPAROSCOPY    . DILATION AND CURETTAGE OF UTERUS  04/15/2011   Procedure: DILATATION AND CURETTAGE;  Surgeon: [name];  Location: [facility];  Service: Gynecology;  Laterality: N/A;  . MUSCLE BIOPSY Left 01/02/2016   Procedure: MUSCLE BIOPSY LEFT DELTOID;  Surgeon: [name], MD;  Location: [facility];  Service: General;  Laterality: Left;  left deltoid muscle bx  . NASAL SEPTUM SURGERY    . SVD     x 2  . TONSILLECTOMY    . WISDOM TOOTH EXTRACTION      There were no vitals filed for this visit.  Subjective Assessment  - 08/16/17 1031    Subjective  Not feeling great today.  Hands swollen today.  Had second Methotrexate injection this morning, no nausea.  Needs code for [place] again.    Pertinent History  prolonged use of Prednisone - is weaning down; thymoma, Sjogren's disease, basal cell carcinoma - face, OA both hands    Currently in Pain?  No/denies                            Balance Exercises - 08/16/17 1035      Balance Exercises: Standing   Tandem Stance  Eyes open;Foam/compliant surface;Other reps (comment) solid surface and then on foam; pulsing UE front/back x 20    SLS  Eyes open;Solid surface;Foam/compliant surface;2 reps;10 secs holding tension with bilat UE yellow t-band, front/back    Step Ups  Forward;Lateral;4 inch no UE support, 10 reps each LE    Other Standing Exercises  Standing with feet together on solid surface, compliant surface performing 20 reps pulsing UE front/back.  Changed to red theraband for all stability exercises on compliant surface to increase challenge; shoes off on compliant.  At counter performed braiding (stepping over and behind) to L and R x 4 reps  total       Access Code: [email or web address]  URL: [email or web address]   Tandem Stance on Foam Pad with Eyes Closed - 20 reps - 4 sets - 1x daily - 5x weekly   Single Leg Stance on Foam Pad - 2 sets - 10 hold - 1x daily - 5x weekly   Braided Sidestepping - 10 reps - 4 sets - 1x daily - 5x weekly   Standing with Feet Together on Foam Pad - 20 reps - 4 sets - 1x daily - 5x weekly   Runner's Step up with Arms Forward - 10 reps - 1x daily - 5x weekly   Lateral Step Up with Counter Support - 10 reps - 1x daily - 5x weekly    PT Education - 08/16/17 1338    Education provided  Yes    Education Details  updated visits, plan for future visits and revised HEP    Person(s) Educated  Patient    Methods  Explanation;Demonstration    Comprehension  Verbalized understanding;Returned  demonstration       PT Short Term Goals - 08/04/17 1033      PT SHORT TERM GOAL #1   Title  Pt will demonstrate independence with initial HEP/walking program  (All STG due 08/01/17)    Time  4    Period  Weeks    Status  Achieved      PT SHORT TERM GOAL #2   Title  Pt will improve functional LE strength with decreased five time sit to stand time to </= 12 seconds    Baseline  15 seconds > 12 seconds    Time  4    Period  Weeks    Status  Achieved      PT SHORT TERM GOAL #3   Title  Pt will improve MMT of proximal hip mm by 1/2 mm grade    Baseline  see eval flowsheet    Time  4    Period  Weeks    Status  Achieved      PT SHORT TERM GOAL #4   Title  Pt will negotiate 8 stairs with one rail supervision with improved eccentric control    Time  4    Period  Weeks    Status  Achieved      PT SHORT TERM GOAL #5   Title  Discuss referral to aquatic therapy program in [facility]) and facilitate referral if patient is agreeable.     Time  1    Period  Weeks    Status  Achieved        PT Long Term Goals - 08/04/17 1043      PT LONG TERM GOAL #1   Title  Pt will demonstrate independence with final gym/wellness HEP, walking program and aquatic exercise  (All LTG 5/28)    Time  8    Period  Weeks    Status  New    Target Date  08/31/17      PT LONG TERM GOAL #2   Title  Pt will improve functional LE strength as indicated by decrease in five time sit to stand time to </= 10 seconds    Time  8    Period  Weeks    Status  New    Target Date  08/31/17      PT LONG TERM GOAL #3   Title  Pt will report being able to stand from bath tub (without going to hands  and knees) independently at home and will perform floor > stand with UE support MOD I    Time  8    Period  Weeks    Status  New    Target Date  08/31/17      PT LONG TERM GOAL #4   Title  Pt will negotiate 12 stairs, no rails MOD I    Time  8    Period  Weeks    Status  Revised    Target Date  08/31/17             Plan - 08/16/17 1339    Clinical Impression Statement  Continued to discuss and make plan for final visits and new plan of care.  Pt agreeable to plan.  Continued to review proximal UE and LE strengthening, stability and balance exercises with resistance of theraband and stability on solid surface and then compliant surface.  Also performed more dynamic proximal hip strengthening with step ups and braiding.  Pt tolerated well; exercises added to [place].     Rehab Potential  Fair    Clinical Impairments Affecting Rehab Potential  active autoimmune disease    PT Frequency  Other (comment) 2x/week x 4 weeks, 1x/week x 4 weeks    PT Duration  8 weeks    PT Treatment/Interventions  ADLs/Self Care Home Management;Aquatic Therapy;Moist Heat;Gait training;Stair training;Functional mobility training;Therapeutic activities;Therapeutic exercise;Patient/family education;Passive range of motion;Energy conservation;Neuromuscular re-education;Balance training    PT Next Visit Plan  Aquatic.   Neuro: continue proximal hip strengthening -gentle yoga/pilates, floor <> stand, tall kneeling <> half kneeling, standing balance - tandem.  quadruped if able    PT Home Exercise Plan  walking program, aquatic therapy, gentle strengthening with isometric exercises progressing to low weight/low resistance of proximal mm, balance    Consulted and Agree with Plan of Care  Patient       Patient will benefit from skilled therapeutic intervention in order to improve the following deficits and impairments:  Decreased strength, Difficulty walking, Impaired UE functional use, Pain, Decreased balance  Visit Diagnosis: Muscle weakness (generalized)  Difficulty in walking, not elsewhere classified  Pain in left leg  Pain in right leg     Problem List Patient Active Problem List   Diagnosis Date Noted  . High risk medication use 04/07/2016  . Sjogren's disease ([place]) 02/04/2016  . Dermatomyositis  ([place]) 02/04/2016  . Former smoker 02/04/2016  . 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 Fatigue 02/15/2012   Rico Junker, PT, DPT 08/16/17    1:43 PM    [facility] [address] [place], [place], [address] Phone: [phone]   Fax:  [phone]  Name: Marisa Orozco MRN: [id number] Date of Birth: 08/10/1950

## 2017-08-18 ENCOUNTER — Other Ambulatory Visit: Payer: Self-pay | Admitting: *Deleted

## 2017-08-18 ENCOUNTER — Encounter: Payer: Self-pay | Admitting: Rheumatology

## 2017-08-18 ENCOUNTER — Other Ambulatory Visit: Payer: Self-pay | Admitting: Rheumatology

## 2017-08-18 DIAGNOSIS — M3390 Dermatopolymyositis, unspecified, organ involvement unspecified: Secondary | ICD-10-CM

## 2017-08-18 DIAGNOSIS — Z79899 Other long term (current) drug therapy: Secondary | ICD-10-CM

## 2017-08-18 MED ORDER — PREDNISONE 10 MG PO TABS: 40.0000 mg | ORAL_TABLET | Freq: Every day | ORAL | 0 refills | Status: DC

## 2017-08-18 NOTE — Telephone Encounter (Signed)
Last Visit: 08/06/17 Next Visit: 09/08/17  Okay to refill per Dr. Estanislado Pandy

## 2017-08-19 ENCOUNTER — Ambulatory Visit: Payer: Medicare Other

## 2017-08-19 ENCOUNTER — Other Ambulatory Visit: Payer: Self-pay

## 2017-08-19 DIAGNOSIS — M79605 Pain in left leg: Secondary | ICD-10-CM | POA: Diagnosis not present

## 2017-08-19 DIAGNOSIS — M79604 Pain in right leg: Secondary | ICD-10-CM | POA: Diagnosis not present

## 2017-08-19 DIAGNOSIS — Z79899 Other long term (current) drug therapy: Secondary | ICD-10-CM | POA: Diagnosis not present

## 2017-08-19 DIAGNOSIS — M6281 Muscle weakness (generalized): Secondary | ICD-10-CM | POA: Diagnosis not present

## 2017-08-19 DIAGNOSIS — R262 Difficulty in walking, not elsewhere classified: Secondary | ICD-10-CM | POA: Diagnosis not present

## 2017-08-19 DIAGNOSIS — M3390 Dermatopolymyositis, unspecified, organ involvement unspecified: Secondary | ICD-10-CM | POA: Diagnosis not present

## 2017-08-19 NOTE — Therapy (Signed)
[place] MAIN [facility] SERVICES [address] [place], [place], [address] Phone: [phone]   Fax:  [phone]  Physical Therapy Treatment  Patient Details  Name: Marisa Orozco MRN: [id number] Date of Birth: Feb 04, 1951 Referring Provider: Bo Merino, MD (Rheumatology)   Encounter Date: 08/19/2017  PT End of Session - 08/19/17 1448    Visit Number  10    Number of Visits  13    Date for PT Re-Evaluation  08/31/17    PT Start Time  1130    PT Stop Time  1235    PT Time Calculation (min)  65 min    Activity Tolerance  Patient tolerated treatment well    Behavior During Therapy  [facility] for tasks assessed/performed       Past Medical History:  Diagnosis Date  . Allergy   . Cancer ([place])    basal cell carcinoma - face  . 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 Former smoker 02/04/2016  . GERD (gastroesophageal reflux disease)    otc med prn  . PONV (postoperative nausea and vomiting)   . Sjogren's disease ([place]) 02/04/2016   Positive ANA, Positive Ro, Positive CCP, Negative RF, Parotitis, Positive Sicca  . Sjogren's syndrome ([place])    from MD note    Past Surgical History:  Procedure Laterality Date  . BREAST SURGERY     augmentation  . BUNIONECTOMY     right foot  . COLONOSCOPY    . DIAGNOSTIC LAPAROSCOPY    . DILATION AND CURETTAGE OF UTERUS  04/15/2011   Procedure: DILATATION AND CURETTAGE;  Surgeon: [name];  Location: [facility];  Service: Gynecology;  Laterality: N/A;  . MUSCLE BIOPSY Left 01/02/2016   Procedure: MUSCLE BIOPSY LEFT DELTOID;  Surgeon: [name], MD;  Location: [place];  Service: General;  Laterality: Left;  left deltoid muscle bx  . NASAL SEPTUM SURGERY    . SVD     x 2  . TONSILLECTOMY    . WISDOM TOOTH EXTRACTION      There were no vitals filed for this visit.  Subjective Assessment - 08/19/17 1446    Subjective  Pt with  no voiced complaints post last session or today. Pt felt she tolerated aquatic session well and eager to continue.     Pertinent History  prolonged use of Prednisone - is weaning down; thymoma, Sjogren's disease, basal cell carcinoma - face, OA both hands      Enters/exits via ramp   Ambulation, blue dumbbells  4 L fwd  4 L side   Core with LE strengthening, 25x ea  Hip abd/add, B  Hip flex/ext, B  Squats  Sumo squats  Suspended (rest time between due to initial education), 1 min each  Jog  Jack  Ski  X [employer] with UE strengthening, 2 x 10 ea  Green dumbbells   Triceps press downs   Sh abd/add   Sh flex/ext (shoulders slightly out of water)   Sh horiz abd/add  Core, seated bench  1 # ankle weights, B, 3 x 10 ea   Partial knee tucks   Straight leg, up and outs, use speed  Suspended work, 1 min each, no rest break between  [employer]  Ski  X [employer] activity with ball toss  Side stepping, 4L  SLS, R/L 1 min each  Modified Planks, 3 x 45  sec each  Fwd  Side, R/L (last rep in starfish position)                          PT Education - 08/19/17 1447    Education provided  Yes    Education Details  continued core strengthening, suspended position and work, Insurance underwriter activities, continued with proper modified plank form and progression       PT Short Term Goals - 08/04/17 1033      PT SHORT TERM GOAL #1   Title  Pt will demonstrate independence with initial HEP/walking program  (All STG due 08/01/17)    Time  4    Period  Weeks    Status  Achieved      PT SHORT TERM GOAL #2   Title  Pt will improve functional LE strength with decreased five time sit to stand time to </= 12 seconds    Baseline  15 seconds > 12 seconds    Time  4    Period  Weeks    Status  Achieved      PT SHORT TERM GOAL #3   Title  Pt will improve MMT of proximal hip mm by 1/2 mm grade    Baseline  see eval flowsheet    Time  4    Period  Weeks    Status   Achieved      PT SHORT TERM GOAL #4   Title  Pt will negotiate 8 stairs with one rail supervision with improved eccentric control    Time  4    Period  Weeks    Status  Achieved      PT SHORT TERM GOAL #5   Title  Discuss referral to aquatic therapy program in [facility]) and facilitate referral if patient is agreeable.     Time  1    Period  Weeks    Status  Achieved        PT Long Term Goals - 08/04/17 1043      PT LONG TERM GOAL #1   Title  Pt will demonstrate independence with final gym/wellness HEP, walking program and aquatic exercise  (All LTG 5/28)    Time  8    Period  Weeks    Status  New    Target Date  08/31/17      PT LONG TERM GOAL #2   Title  Pt will improve functional LE strength as indicated by decrease in five time sit to stand time to </= 10 seconds    Time  8    Period  Weeks    Status  New    Target Date  08/31/17      PT LONG TERM GOAL #3   Title  Pt will report being able to stand from bath tub (without going to hands and knees) independently at home and will perform floor > stand with UE support MOD I    Time  8    Period  Weeks    Status  New    Target Date  08/31/17      PT LONG TERM GOAL #4   Title  Pt will negotiate 12 stairs, no rails MOD I    Time  8    Period  Weeks    Status  Revised    Target Date  08/31/17            Plan - 08/19/17 1449  Clinical Impression Statement  Pt tolerated new exercises well demonstrating good work effort. Progressed core with UE strengthening (green dumbbells), modified plank holds, seated bench core work with added set of 10 each and balance work to include SLS for progressed strength as well. Pt challenged with bouts of increased speed (resistive and cardio work) in suspended position incorporated as intervals between strength work. Continue to progress this template of training.     Rehab Potential  Fair    Clinical Impairments Affecting Rehab Potential  active autoimmune disease    PT  Frequency  Other (comment) 2x/week x 4 weeks, 1x/week x 4 weeks    PT Duration  8 weeks    PT Treatment/Interventions  ADLs/Self Care Home Management;Aquatic Therapy;Moist Heat;Gait training;Stair training;Functional mobility training;Therapeutic activities;Therapeutic exercise;Patient/family education;Passive range of motion;Energy conservation;Neuromuscular re-education;Balance training    PT Next Visit Plan  Aquatic.   Neuro: continue proximal hip strengthening -gentle yoga/pilates, floor <> stand, tall kneeling <> half kneeling, standing balance - tandem.  quadruped if able    PT Home Exercise Plan  walking program, aquatic therapy, gentle strengthening with isometric exercises progressing to low weight/low resistance of proximal mm, balance    Consulted and Agree with Plan of Care  Patient       Patient will benefit from skilled therapeutic intervention in order to improve the following deficits and impairments:  Decreased strength, Difficulty walking, Impaired UE functional use, Pain, Decreased balance  Visit Diagnosis: Muscle weakness (generalized)  Difficulty in walking, not elsewhere classified  Pain in left leg  Pain in right leg     Problem List Patient Active Problem List   Diagnosis Date Noted  . High risk medication use 04/07/2016  . Sjogren's disease ([place]) 02/04/2016  . 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 Former smoker 02/04/2016  . 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 Fatigue 02/15/2012    Larae Grooms 08/19/2017, 2:55 PM  [place] MAIN [facility] SERVICES [address] [place], [place], [address] Phone: [phone]   Fax:  [phone]  Name: Marisa Orozco MRN: [id number] Date of Birth: July 04, 1950

## 2017-08-20 LAB — COMPLETE METABOLIC PANEL WITH GFR
AG Ratio: 1.7 (calc) (ref 1.0–2.5)
ALBUMIN MSPROF: 4 g/dL (ref 3.6–5.1)
ALT: 25 U/L (ref 6–29)
AST: 15 U/L (ref 10–35)
Alkaline phosphatase (APISO): 57 U/L (ref 33–130)
BUN: 19 mg/dL (ref 7–25)
CO2: 31 mmol/L (ref 20–32)
Calcium: 9.4 mg/dL (ref 8.6–10.4)
Chloride: 106 mmol/L (ref 98–110)
Creat: 0.82 mg/dL (ref 0.50–0.99)
GFR, Est African American: 86 mL/min/{1.73_m2} (ref >=60)
GFR, Est Non African American: 75 mL/min/{1.73_m2} (ref >=60)
GLUCOSE: 86 mg/dL (ref 65–99)
Globulin: 2.4 g/dL (calc) (ref 1.9–3.7)
Potassium: 4.1 mmol/L (ref 3.5–5.3)
Sodium: 141 mmol/L (ref 135–146)
TOTAL PROTEIN: 6.4 g/dL (ref 6.1–8.1)
Total Bilirubin: 0.4 mg/dL (ref 0.2–1.2)

## 2017-08-20 LAB — CBC WITH DIFFERENTIAL/PLATELET
BASOS ABS: 62 {cells}/uL (ref 0–200)
Basophils Relative: 0.6 %
Eosinophils Absolute: 426 cells/uL (ref 15–500)
Eosinophils Relative: 4.1 %
HEMATOCRIT: 44 % (ref 35.0–45.0)
HEMOGLOBIN: 14.4 g/dL (ref 11.7–15.5)
LYMPHS ABS: 2891 {cells}/uL (ref 850–3900)
MCH: 28.3 pg (ref 27.0–33.0)
MCHC: 32.7 g/dL (ref 32.0–36.0)
MCV: 86.4 fL (ref 80.0–100.0)
MPV: 10.7 fL (ref 7.5–12.5)
Monocytes Relative: 8.2 %
NEUTROS ABS: 6167 {cells}/uL (ref 1500–7800)
NEUTROS PCT: 59.3 %
Platelets: 240 10*3/uL (ref 140–400)
RBC: 5.09 10*6/uL (ref 3.80–5.10)
RDW: 14.3 % (ref 11.0–15.0)
Total Lymphocyte: 27.8 %
WBC mixed population: 853 cells/uL (ref 200–950)
WBC: 10.4 10*3/uL (ref 3.8–10.8)

## 2017-08-20 LAB — CK: Total CK: 50 U/L (ref 29–143)

## 2017-08-22 ENCOUNTER — Encounter: Payer: Self-pay | Admitting: Rheumatology

## 2017-08-23 ENCOUNTER — Other Ambulatory Visit: Payer: Self-pay | Admitting: Rheumatology

## 2017-08-23 ENCOUNTER — Encounter: Payer: Self-pay | Admitting: Physical Therapy

## 2017-08-23 ENCOUNTER — Ambulatory Visit: Payer: Medicare Other | Admitting: Physical Therapy

## 2017-08-23 DIAGNOSIS — M79604 Pain in right leg: Secondary | ICD-10-CM | POA: Diagnosis not present

## 2017-08-23 DIAGNOSIS — M6281 Muscle weakness (generalized): Secondary | ICD-10-CM

## 2017-08-23 DIAGNOSIS — R262 Difficulty in walking, not elsewhere classified: Secondary | ICD-10-CM | POA: Diagnosis not present

## 2017-08-23 DIAGNOSIS — M79605 Pain in left leg: Secondary | ICD-10-CM | POA: Diagnosis not present

## 2017-08-23 MED ORDER — PREDNISONE 5 MG PO TABS: ORAL_TABLET | ORAL | 0 refills | Status: DC

## 2017-08-23 NOTE — Therapy (Signed)
[place] [address] [place], [place], [address] Phone: [phone]   Fax:  [phone]  Physical Therapy Treatment  Patient Details  Name: Marisa Orozco MRN: [id number] Date of Birth: 12-28-1950 Referring Provider: Bo Merino, MD (Rheumatology)   Encounter Date: 08/23/2017  PT End of Session - 08/23/17 1123    Visit Number  11    Number of Visits  13    Date for PT Re-Evaluation  08/31/17    Authorization Type  Medicare, [employer]    PT Start Time  1020    PT Stop Time  1105    PT Time Calculation (min)  45 min    Activity Tolerance  Patient tolerated treatment well    Behavior During Therapy  [facility] for tasks assessed/performed       Past Medical History:  Diagnosis Date  . Allergy   . Cancer ([place])    basal cell carcinoma - face  . 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 Former smoker 02/04/2016  . GERD (gastroesophageal reflux disease)    otc med prn  . PONV (postoperative nausea and vomiting)   . Sjogren's disease ([place]) 02/04/2016   Positive ANA, Positive Ro, Positive CCP, Negative RF, Parotitis, Positive Sicca  . Sjogren's syndrome ([place])    from MD note    Past Surgical History:  Procedure Laterality Date  . BREAST SURGERY     augmentation  . BUNIONECTOMY     right foot  . COLONOSCOPY    . DIAGNOSTIC LAPAROSCOPY    . DILATION AND CURETTAGE OF UTERUS  04/15/2011   Procedure: DILATATION AND CURETTAGE;  Surgeon: [name];  Location: [facility];  Service: Gynecology;  Laterality: N/A;  . MUSCLE BIOPSY Left 01/02/2016   Procedure: MUSCLE BIOPSY LEFT DELTOID;  Surgeon: [name], MD;  Location: [place];  Service: General;  Laterality: Left;  left deltoid muscle bx  . NASAL SEPTUM SURGERY    . SVD     x 2  . TONSILLECTOMY    . WISDOM TOOTH EXTRACTION      There were no vitals filed for this visit.  Subjective  Assessment - 08/23/17 1022    Subjective  She "ramped it up in aquatic therapy" but tolerating it well.  Feeling really good, CK levels are normal.  On the same amount of Prednisone, weaning down 5mg /month.    Pertinent History  prolonged use of Prednisone - is weaning down; thymoma, Sjogren's disease, basal cell carcinoma - face, OA both hands    Currently in Pain?  No/denies                       [facility] Adult PT Treatment/Exercise - 08/23/17 1059      Therapeutic Activites    Therapeutic Activities  Other Therapeutic Activities    Other Therapeutic Activities  adjusted HEP on MedBridge removing exercises that no longer provide sufficient challenge; re-organized exercises and adjusted frequency so that pt performs balance exercises 5x/week including days she performs aquatic but only performs LE and UE strengthening exercises 3x/week on days when she is not performing aquatic therapy.        Neuro Re-ed    Neuro Re-ed Details   NMR in Quadruped and tall kneeling for proximal shoulder, hip and core strengthening performing 10 reps each: cat-cow spinal flexion<> extension, alternating UE raises, alternating LE hip extension, alternating contralateral  UE and LE raises, alternating contralateral UE and LE raises with ABD, tall kneeling squats to L and R, center squats with UE flexion holding 3lb weight, side squats with diagonal UE raises with 3lb weight, alternating tall > half kneeling with shoulder flexion with 3lb weight.            Balance Exercises - 08/23/17 1121      Balance Exercises: Standing   Standing Eyes Opened  Narrow base of support (BOS);Head turns;Foam/compliant surface;Other reps (comment) rockerboard ant/post 10 reps head nods/turns    Standing Eyes Closed  Narrow base of support (BOS);Foam/compliant surface;30 secs;3 reps rockerboard ant/post        PT Education - 08/23/17 1123    Education provided  Yes    Education Details  adjusted MedBridge HEP and  frequency of exercises    Person(s) Educated  Patient    Methods  Explanation    Comprehension  Verbalized understanding       PT Short Term Goals - 08/23/17 1130      PT SHORT TERM GOAL #1   Title  Pt will demonstrate independence with ongoing MedBridge HEP ([id number])  (All STG due 09/30/17)    Time  4    Period  Weeks    Status  Achieved    Target Date  09/30/17      PT SHORT TERM GOAL #2   Title  Patient will initiate community wellness program at [employer] or [facility] with pool available and initiate aquatic HEP.    Baseline  --    Time  4    Period  Weeks    Status  New    Target Date  09/30/17      PT SHORT TERM GOAL #3   Title  Pt will improve MMT of proximal hip mm to 5/5 bilaterally    Baseline  --    Time  4    Period  Weeks    Status  Revised    Target Date  09/30/17      PT SHORT TERM GOAL #4   Title  Pt will negotiate 8 stairs alternating sequence, MOD I without rails    Time  4    Period  Weeks    Status  Revised    Target Date  09/30/17      PT SHORT TERM GOAL #5   Title  --    Time  --    Period  --    Status  --        PT Long Term Goals - 08/23/17 1129      PT LONG TERM GOAL #1   Title  Pt will demonstrate independence with final gym/wellness HEP, walking program and aquatic exercise  (All LTG due 7/27)    Time  8    Period  Weeks    Status  On-going    Target Date  10/30/17      PT LONG TERM GOAL #2   Title  Pt will improve functional LE strength as indicated by decrease in five time sit to stand time to </= 10 seconds    Baseline  12 seconds    Time  8    Period  Weeks    Status  On-going    Target Date  10/30/17      PT LONG TERM GOAL #3   Title  Pt will report being able to stand from bath tub (without going to hands and knees) independently at  home and will perform floor > stand with UE support MOD I    Time  8    Period  Weeks    Status  On-going    Target Date  10/30/17      PT LONG TERM GOAL #4   Title  Pt will  negotiate 12 stairs, no rails MOD I    Time  8    Period  Weeks    Status  On-going    Target Date  10/30/17            Plan - 08/23/17 1123    Clinical Impression Statement  To avoid over exertion as pt continues to wean off of Prenisone adjusted HEP - consolidated exercises and adjusted frequency of strengthening exercises.  Pt encouraged to perform balance exercises each day but allow 2 days of full rest.  Pt demonstrated improved tolerance of NMR in weight bearing in tall kneeling and quadruped today but did report significant LE fatigue at end of tall kneeling actitvities.  Progressed corner balance today to more unstable support surface.  Pt is making good progress towards goals and would benefit from ongoing land based and aquatic physical therapy to continue to address impairments in UE, LE and core strength, endurance and balance.      Rehab Potential  Fair    Clinical Impairments Affecting Rehab Potential  active autoimmune disease    PT Frequency  2x / week    PT Duration  8 weeks PT re-assessment every 30 days    PT Treatment/Interventions  ADLs/Self Care Home Management;Aquatic Therapy;Moist Heat;Gait training;Stair training;Functional mobility training;Therapeutic activities;Therapeutic exercise;Patient/family education;Passive range of motion;Energy conservation;Neuromuscular re-education;Balance training    PT Next Visit Plan  Aquatic.    Neuro: assess STG on 6/26    PT Home Exercise Plan  walking program, aquatic therapy, gentle strengthening with isometric exercises progressing to low weight/low resistance of proximal mm, balance    Consulted and Agree with Plan of Care  Patient       Patient will benefit from skilled therapeutic intervention in order to improve the following deficits and impairments:  Decreased strength, Difficulty walking, Impaired UE functional use, Pain, Decreased balance  Visit Diagnosis: Muscle weakness (generalized)  Difficulty in walking, not  elsewhere classified  Pain in left leg  Pain in right leg     Problem List Patient Active Problem List   Diagnosis Date Noted  . High risk medication use 04/07/2016  . Sjogren's disease ([place]) 02/04/2016  . 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 Former smoker 02/04/2016  . 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 Fatigue 02/15/2012    Rico Junker, PT, DPT 08/23/17    11:36 AM    [facility] [address] [place], [place], [address] Phone: [phone]   Fax:  [phone]  Name: Marisa Orozco MRN: [id number] Date of Birth: 20-Sep-1950

## 2017-08-23 NOTE — Telephone Encounter (Signed)
Last Visit: 08/06/17 Next Visit: 09/08/17  Okay to refill per Dr. Estanislado Pandy

## 2017-08-24 ENCOUNTER — Telehealth: Payer: Self-pay | Admitting: Rheumatology

## 2017-08-24 NOTE — Telephone Encounter (Signed)
Patient called stating she was returning  Andrea's call regarding her labwork results. 

## 2017-08-24 NOTE — Telephone Encounter (Signed)
Returned patient's call and advised patient of lab results. Patient verbalized understanding.

## 2017-08-26 ENCOUNTER — Ambulatory Visit: Payer: Medicare Other

## 2017-08-26 ENCOUNTER — Other Ambulatory Visit: Payer: Self-pay

## 2017-08-26 DIAGNOSIS — M79604 Pain in right leg: Secondary | ICD-10-CM

## 2017-08-26 DIAGNOSIS — M6281 Muscle weakness (generalized): Secondary | ICD-10-CM | POA: Diagnosis not present

## 2017-08-26 DIAGNOSIS — M79605 Pain in left leg: Secondary | ICD-10-CM

## 2017-08-26 DIAGNOSIS — R262 Difficulty in walking, not elsewhere classified: Secondary | ICD-10-CM

## 2017-08-26 NOTE — Progress Notes (Signed)
Office Visit Note  Patient: Marisa Orozco             Date of Birth: 1950/12/31           MRN: [id number]             PCP: Caryl Bis, MD Referring: Caryl Bis, MD Visit Date: 09/08/2017 Occupation: @GUAROCC @    Subjective:  Medication management.   History of Present Illness: Marisa Orozco is a 67 y.o. female with history of dermatomyositis and Sjogren's.  She states she has been on prednisone 35 mg p.o. daily for 2 weeks now.  She has not noticed any worsening of her symptoms.  She continues to do well on high-dose prednisone.  She is planning to taper it down further.  She is on methotrexate 0.8 mL subcu weekly.  She denies any increased muscle weakness or tenderness.  She does feel some fatigue after the methotrexate dose.  Activities of Daily Living:  Patient reports morning stiffness for 0 minute.   Patient Denies nocturnal pain.  Difficulty dressing/grooming: Denies Difficulty climbing stairs: Denies Difficulty getting out of chair: Denies Difficulty using hands for taps, buttons, cutlery, and/or writing: Denies   Review of Systems  Constitutional: Positive for fatigue. Negative for night sweats, weight gain and weight loss.  HENT: Negative for mouth sores, trouble swallowing, trouble swallowing, mouth dryness and nose dryness.   Eyes: Negative for pain, redness, visual disturbance and dryness.  Respiratory: Negative for cough, shortness of breath and difficulty breathing.   Cardiovascular: Negative for chest pain, palpitations, hypertension, irregular heartbeat and swelling in legs/feet.  Gastrointestinal: Negative for blood in stool, constipation and diarrhea.  Endocrine: Negative for increased urination.  Genitourinary: Negative for vaginal dryness.  Musculoskeletal: Negative for arthralgias, joint pain, joint swelling, myalgias, muscle weakness, morning stiffness, muscle tenderness and myalgias.  Skin: Negative for color change, rash, hair loss, skin  tightness, ulcers and sensitivity to sunlight.  Allergic/Immunologic: Negative for susceptible to infections.  Neurological: Negative for dizziness, memory loss, night sweats and weakness.  Hematological: Negative for swollen glands.  Psychiatric/Behavioral: Positive for sleep disturbance. Negative for depressed mood. The patient is not nervous/anxious.     PMFS History:  Patient Active Problem List   Diagnosis Date Noted  . Primary osteoarthritis of both hands 09/08/2017  . High risk medication use 04/07/2016  . Sjogren's disease ([place]) 02/04/2016  . 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 Former smoker 02/04/2016  . 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 Fatigue 02/15/2012    Past Medical History:  Diagnosis Date  . Allergy   . Cancer ([place])    basal cell carcinoma - face  . 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 Former smoker 02/04/2016  . GERD (gastroesophageal reflux disease)    otc med prn  . PONV (postoperative nausea and vomiting)   . Sjogren's disease ([place]) 02/04/2016   Positive ANA, Positive Ro, Positive CCP, Negative RF, Parotitis, Positive Sicca  . Sjogren's syndrome ([place])    from MD note    Family History  Problem Relation Age of Onset  . COPD Mother   . Heart disease Mother   . Diabetes Father   . Kidney disease Father   . Epilepsy Daughter   . Colon cancer Neg Hx   . Esophageal cancer Neg Hx   . Stomach cancer Neg Hx   . Rectal cancer Neg Hx    Past Surgical History:  Procedure Laterality Date  .  BREAST SURGERY     augmentation  . BUNIONECTOMY     right foot  . COLONOSCOPY    . DIAGNOSTIC LAPAROSCOPY    . DILATION AND CURETTAGE OF UTERUS  04/15/2011   Procedure: DILATATION AND CURETTAGE;  Surgeon: [name];  Location: [facility];  Service: Gynecology;  Laterality: N/A;  . MUSCLE BIOPSY Left 01/02/2016   Procedure: MUSCLE BIOPSY LEFT DELTOID;  Surgeon: [name], MD;  Location: [facility];   Service: General;  Laterality: Left;  left deltoid muscle bx  . NASAL SEPTUM SURGERY    . SVD     x 2  . TONSILLECTOMY    . WISDOM TOOTH EXTRACTION     Social History   Social History Narrative  . Not on file     Objective: Vital Signs: BP 120/61 (BP Location: Left Arm, Patient Position: Sitting, Cuff Size: Normal)   Pulse 65   Resp 14   Ht 5\' 6"  (1.676 m)   Wt 167 lb 8 oz (76 kg)   BMI 27.04 kg/m    Physical Exam  Constitutional: She is oriented to person, place, and time. She appears well-developed and well-nourished.  HENT:  Head: Normocephalic and atraumatic.  Eyes: Conjunctivae and EOM are normal.  Neck: Normal range of motion.  Cardiovascular: Normal rate, regular rhythm, normal heart sounds and intact distal pulses.  Pulmonary/Chest: Effort normal and breath sounds normal.  Abdominal: Soft. Bowel sounds are normal.  Lymphadenopathy:    She has no cervical adenopathy.  Neurological: She is alert and oriented to person, place, and time.  Skin: Skin is warm and dry. Capillary refill takes less than 2 seconds.  Psychiatric: She has a normal mood and affect. Her behavior is normal.  Nursing note and vitals reviewed.    Musculoskeletal Exam: C-spine thoracic lumbar spine good range of motion.  Shoulder joints elbow joints wrist joint MCPs PIPs DIPs were in good range of motion.  Hip joints knee joints ankles MTPs PIPs DIPs in good range of motion.  She is no muscular weakness or muscle tenderness on examination.  CDAI Exam: No CDAI exam completed.    Investigation: No additional findings. CBC Latest Ref Rng & Units 09/02/2017 08/19/2017 06/23/2017  WBC 3.8 - 10.8 Thousand/uL 9.7 10.4 12.8(H)  Hemoglobin 11.7 - 15.5 g/dL 14.5 14.4 14.1  Hematocrit 35.0 - 45.0 % 43.6 44.0 42.7  Platelets 140 - 400 Thousand/uL 222 240 317   CMP Latest Ref Rng & Units 09/02/2017 08/19/2017 08/06/2017  Glucose 65 - 99 mg/dL 73 86 92  BUN 7 - 25 mg/dL 20 19 22   Creatinine 0.50 - 0.99  mg/dL 0.77 0.82 0.82  Sodium 135 - 146 mmol/L 142 141 144  Potassium 3.5 - 5.3 mmol/L 4.0 4.1 3.8  Chloride 98 - 110 mmol/L 106 106 106  CO2 20 - 32 mmol/L 27 31 30   Calcium 8.6 - 10.4 mg/dL 9.0 9.4 9.5  Total Protein 6.1 - 8.1 g/dL 6.4 6.4 6.8  Total Bilirubin 0.2 - 1.2 mg/dL 0.6 0.4 0.6  Alkaline Phos 33 - 130 U/L - - -  AST 10 - 35 U/L 20 15 18   ALT 6 - 29 U/L 30(H) 25 20    Imaging: Ct Chest Wo Contrast  Result Date: 08/31/2017 CLINICAL DATA:  Follow-up of mediastinal mass.  Diagnosed in 2017. EXAM: CT CHEST WITHOUT CONTRAST TECHNIQUE: Multidetector CT imaging of the chest was performed following the standard protocol without IV contrast. COMPARISON:  02/23/2017 FINDINGS: Cardiovascular:  Aortic atherosclerosis. Normal heart size, without pericardial effusion. Mediastinum/Nodes: No supraclavicular adenopathy. No middle mediastinal or hilar adenopathy. Tiny hiatal hernia. Anterior mediastinal soft tissue density lesion measures 2.3 by 1.0 cm on image 68/2 versus 2.4 x 1.0 cm on the prior. 3.8 cm craniocaudal on coronal image 38 today versus 5.1 cm at the same level on the prior. Lungs/Pleura: No pleural fluid.  Clear lungs. Upper Abdomen: Normal imaged portions of the liver, spleen, pancreas, adrenal glands, kidneys, gallbladder. Musculoskeletal: Bilateral calcified breast implants. Similar appearance of right-sided extracapsular rupture. No acute osseous abnormality. IMPRESSION: 1. Slight decrease in size of anterior mediastinal soft tissue density, again favoring a benign etiology. Favor thymic hyperplasia. 2. Coronary artery atherosclerosis. Aortic Atherosclerosis (ICD10-I70.0). 3.  Tiny hiatal hernia. Electronically Signed   By: Abigail Miyamoto M.D.   On: 08/31/2017 10:53    Speciality Comments: PLQ Eye Exam: 07/12/17 WNL @ [facility]. Follow up 1 year.    Procedures:  No procedures performed Allergies: Codeine; Flagyl [metronidazole hcl]; Nexium [esomeprazole magnesium];  Imuran [azathioprine]; Sulfa antibiotics; and Sulfa drugs cross reactors   Assessment / Plan:     Visit Diagnoses: Dermatomyositis ([place]) - Jo 1+, positive muscle biopsy for dermatomyositis.  Patient is doing much better on high-dose prednisone and methotrexate.  She is aware of the long-term side effects of the prednisone.  We had detailed discussion regarding prednisone taper.  She was tapering prednisone by 5 mg every month.  She is willing to taper it by 5 mg every 2 weeks now.  We will continue to monitor her CBC CMP and CK every month.  If she has any worsening of symptoms she should notify me.  She has not noticed any side effects of from methotrexate except for fatigue.  Her strength is improved since she has been doing physical therapy.  Sjogren's syndrome with keratoconjunctivitis sicca (HCC) - Positive ANA, positive Ro, positive anti-CCP, negative rheumatoid factor, SPEP negative.   High risk medication use - TKP5.4 ml sq qwk, folic acid 2 mg po qd, prednisone 35 mg p.o. daily.  Her LFTs are mildly elevated we will continue to monitor.  She will discontinue milk thistle.  Primary osteoarthritis of both hands-she is not having much stiffness in her hands currently.  Other fatigue-her fatigue is better although she does experience some fatigue from methotrexate use.  Thymoma - She gets follow-up [facility].  Patient reports her recent CT scan showed decreasing size of thymoma.  Former smoker    Orders: No orders of the defined types were placed in this encounter.  No orders of the defined types were placed in this encounter.   Face-to-face time spent with patient was 30 minutes.> 50% of time was spent in counseling and coordination of care.  Follow-Up Instructions: Return in about 3 months (around 12/09/2017) for Dermatomyositis, Sjogren's.   Bo Merino, MD  Note - This record has been created using Editor, commissioning.  Chart creation errors have been sought, but may not always    have been located. Such creation errors do not reflect on  the standard of medical care.

## 2017-08-26 NOTE — Therapy (Signed)
[place] MAIN [facility] SERVICES [address] [place], [place], [address] Phone: [phone]   Fax:  [phone]  Physical Therapy Treatment  Patient Details  Name: Marisa Orozco MRN: [id number] Date of Birth: [birth year] Referring Provider: Bo Merino, MD (Rheumatology)   Encounter Date: 08/26/2017  PT End of Session - 08/26/17 1740    Visit Number  12    Number of Visits  13    Date for PT Re-Evaluation  08/31/17    PT Start Time  1210    PT Stop Time  1315    PT Time Calculation (min)  65 min    Activity Tolerance  Patient tolerated treatment well    Behavior During Therapy  [facility] for tasks assessed/performed       Past Medical History:  Diagnosis Date  . Allergy   . Cancer ([place])    basal cell carcinoma - face  . 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 Former smoker 02/04/2016  . GERD (gastroesophageal reflux disease)    otc med prn  . PONV (postoperative nausea and vomiting)   . Sjogren's disease ([place]) 02/04/2016   Positive ANA, Positive Ro, Positive CCP, Negative RF, Parotitis, Positive Sicca  . Sjogren's syndrome ([place])    from MD note    Past Surgical History:  Procedure Laterality Date  . BREAST SURGERY     augmentation  . BUNIONECTOMY     right foot  . COLONOSCOPY    . DIAGNOSTIC LAPAROSCOPY    . DILATION AND CURETTAGE OF UTERUS  04/15/2011   Procedure: DILATATION AND CURETTAGE;  Surgeon: [name];  Location: [facility];  Service: Gynecology;  Laterality: N/A;  . MUSCLE BIOPSY Left 01/02/2016   Procedure: MUSCLE BIOPSY LEFT DELTOID;  Surgeon: [name], MD;  Location: [facility];  Service: General;  Laterality: Left;  left deltoid muscle bx  . NASAL SEPTUM SURGERY    . SVD     x 2  . TONSILLECTOMY    . WISDOM TOOTH EXTRACTION      There were no vitals filed for this visit.  Subjective Assessment - 08/26/17 1737    Subjective  Pt  reports tolerating last session well, but did note some mild R groing soreness; unsure if related. Discussion with pt that she did perform activities using adductor muscles, which may have caused. Pt noted soreness only for a day and did not interrupt her function. Pt agreeable to progression of core/LE work today.       Enters/exits via ramp  Ambulation  Fwd 4 L  sidestep 2 L   sidestep with squat 2 L  High plank with dynamic core/LE work, 20x each  SKTC  Single leg jack  Up and over ext  High side plank, B, 3 x 45 sec  Stand ball to knee tuck, 2 x 10 B  Oblique side press, blue dumbbell, B, 20x each  Squat with green dumbbells, 20x   Side lunge with green dumbbells, B , 25x ea. Increased learn time for form  Core with UE strength, 2 x 10 ea, green dumbbells  Triceps press downs  Sh abd/add  Sh flex/ext   Sh horiz abd/add  Stretch, B 3 x 10 sec each  hamstrings  hip flexor with lateral overhead reach  PT Education - 08/26/17 1739    Education provided  Yes    Education Details  Progression of core, high plank work, understanding of use of dumbells to increase various LE/core exercises.        PT Short Term Goals - 08/23/17 1130      PT SHORT TERM GOAL #1   Title  Pt will demonstrate independence with ongoing [employer] HEP ([id number])  (All STG due 09/30/17)    Time  4    Period  Weeks    Status  Achieved    Target Date  09/30/17      PT SHORT TERM GOAL #2   Title  Patient will initiate community wellness program at [employer] or [employer] with pool available and initiate aquatic HEP.    Baseline  --    Time  4    Period  Weeks    Status  New    Target Date  09/30/17      PT SHORT TERM GOAL #3   Title  Pt will improve MMT of proximal hip mm to 5/5 bilaterally    Baseline  --    Time  4    Period  Weeks    Status  Revised    Target Date  09/30/17      PT SHORT TERM GOAL #4   Title  Pt will negotiate 8 stairs  alternating sequence, MOD I without rails    Time  4    Period  Weeks    Status  Revised    Target Date  09/30/17      PT SHORT TERM GOAL #5   Title  --    Time  --    Period  --    Status  --        PT Long Term Goals - 08/23/17 1129      PT LONG TERM GOAL #1   Title  Pt will demonstrate independence with final gym/wellness HEP, walking program and aquatic exercise  (All LTG due 7/27)    Time  8    Period  Weeks    Status  On-going    Target Date  10/30/17      PT LONG TERM GOAL #2   Title  Pt will improve functional LE strength as indicated by decrease in five time sit to stand time to </= 10 seconds    Baseline  12 seconds    Time  8    Period  Weeks    Status  On-going    Target Date  10/30/17      PT LONG TERM GOAL #3   Title  Pt will report being able to stand from bath tub (without going to hands and knees) independently at home and will perform floor > stand with UE support MOD I    Time  8    Period  Weeks    Status  On-going    Target Date  10/30/17      PT LONG TERM GOAL #4   Title  Pt will negotiate 12 stairs, no rails MOD I    Time  8    Period  Weeks    Status  On-going    Target Date  10/30/17            Plan - 08/26/17 1741    Clinical Impression Statement  Pt tolerated session well. Verbal and tactile cues used for technique corrections. Pt challenged, but able to perform with  work and understands concept of challenge with correct form and ability to perform brings change.     Rehab Potential  Fair    Clinical Impairments Affecting Rehab Potential  active autoimmune disease    PT Frequency  2x / week    PT Duration  8 weeks PT re-assessment every 30 days    PT Treatment/Interventions  ADLs/Self Care Home Management;Aquatic Therapy;Moist Heat;Gait training;Stair training;Functional mobility training;Therapeutic activities;Therapeutic exercise;Patient/family education;Passive range of motion;Energy conservation;Neuromuscular  re-education;Balance training    PT Next Visit Plan  Aquatic.    Neuro: assess STG on 6/26    PT Home Exercise Plan  walking program, aquatic therapy, gentle strengthening with isometric exercises progressing to low weight/low resistance of proximal mm, balance    Consulted and Agree with Plan of Care  Patient       Patient will benefit from skilled therapeutic intervention in order to improve the following deficits and impairments:  Decreased strength, Difficulty walking, Impaired UE functional use, Pain, Decreased balance  Visit Diagnosis: Muscle weakness (generalized)  Difficulty in walking, not elsewhere classified  Pain in left leg  Pain in right leg     Problem List Patient Active Problem List   Diagnosis Date Noted  . High risk medication use 04/07/2016  . Sjogren's disease ([place]) 02/04/2016  . 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 Former smoker 02/04/2016  . 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 Fatigue 02/15/2012    Larae Grooms 08/26/2017, 5:44 PM  [place] MAIN [facility] SERVICES [address] [place], [place], [address] Phone: [phone]   Fax:  [phone]  Name: SHALAWN WYNDER MRN: [id number] Date of Birth: May 04, 1950

## 2017-08-31 ENCOUNTER — Ambulatory Visit
Admission: RE | Admit: 2017-08-31 | Discharge: 2017-08-31 | Disposition: A | Discharge status: Home / Self Care | Payer: Medicare Other | Source: Ambulatory Visit | Attending: Thoracic Surgery (Cardiothoracic Vascular Surgery) | Admitting: Thoracic Surgery (Cardiothoracic Vascular Surgery)

## 2017-08-31 ENCOUNTER — Ambulatory Visit (INDEPENDENT_AMBULATORY_CARE_PROVIDER_SITE_OTHER): Payer: Medicare Other | Admitting: Thoracic Surgery (Cardiothoracic Vascular Surgery)

## 2017-08-31 ENCOUNTER — Other Ambulatory Visit: Payer: Self-pay

## 2017-08-31 VITALS — BP 154/75 | HR 73 | Resp 16 | Ht 66.0 in | Wt 166.0 lb

## 2017-08-31 DIAGNOSIS — J9859 Other diseases of mediastinum, not elsewhere classified: Secondary | ICD-10-CM

## 2017-08-31 DIAGNOSIS — J9851 Mediastinitis: Secondary | ICD-10-CM | POA: Diagnosis not present

## 2017-08-31 NOTE — Progress Notes (Signed)
[address]       [place] [address]             [phone]     HPI: Marisa Orozco returns for a scheduled follow-up visit  Marisa Orozco is a 67 year old woman with a history of Sjogren's syndrome, dermatomyositis and chronic fatigue.  She has a remote history of tobacco abuse.  She was found to have an anterior mediastinal mass on a CT in 2017.  There was low-grade activity on the PET/CT.  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  We discussed surgical resection versus radiographic follow-up.  She was aware of the there could be some improvement in her autoimmune symptoms with thymectomy.  She did not wish to have surgery.  She chose to be followed radiographically.  I last saw her in November 2018.  She was on prednisone and Plaquenil at that time.  She was only on 6 mg of prednisone daily.  In the interim since her last visit she has had worsening symptoms.  She has been started on methotrexate.  Prednisone at 20 mg daily was not effective so was increased to 40 mg daily.  She is currently trying to wean down on that.  Past Medical History:  Diagnosis Date  . Allergy   . Cancer ([place])    basal cell carcinoma - face  . 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 Former smoker 02/04/2016  . GERD (gastroesophageal reflux disease)    otc med prn  . PONV (postoperative nausea and vomiting)   . Sjogren's disease ([place]) 02/04/2016   Positive ANA, Positive Ro, Positive CCP, Negative RF, Parotitis, Positive Sicca  . Sjogren's syndrome ([place])    from MD note    Current Outpatient Medications  Medication Sig Dispense Refill  . BIOTIN PO Take 8 mg by mouth daily.    Marland Kitchen BORON PO Take 30 mg by mouth.    . Calcium Citrate-Vitamin D (CALCIUM CITRATE+D3 PO) Take by mouth daily.    . Cholecalciferol (VITAMIN D3) 1000 UNIT/SPRAY LIQD Take 5 sprays by mouth daily.     . Coenzyme Q10 (COQ10) 100 MG CAPS Take by  mouth every morning.    . Cyanocobalamin (VITAMIN B-12) 1000 MCG/15ML LIQD Take by mouth daily.    . Flaxseed Oil OIL Take 15 mLs by mouth daily.    . folic acid (FOLVITE) 1 MG tablet Take 1 tablet (1 mg total) daily by mouth. 90 tablet 3  . Ginger, Zingiber officinalis, (GINGER ROOT PO) Take by mouth.    Marland Kitchen ibuprofen (ADVIL,MOTRIN) 200 MG tablet Take 400 mg by mouth daily as needed for moderate pain.     Marland Kitchen MAGNESIUM MALATE PO Take by mouth.    . methotrexate 50 MG/2ML injection Inject 0.4 mL for 2 weeks. Increase to 0.6 mL for 2 weeks. Increase to 0.8 mL for 2 weeks. Stay on this dose. 4 mL 2  . Multiple Vitamins-Minerals (MULTIVITAMIN ADULT PO) Take by mouth daily.    . Naltrexone POWD 1.5 mg by Does not apply route daily.     . Omega-3 Fatty Acids (FISH OIL) 1000 MG CAPS Take by mouth.    Marland Kitchen OVER THE COUNTER MEDICATION daily.    . predniSONE (DELTASONE) 1 MG tablet Take 4 tablets (4 mg total) by mouth daily with breakfast. 120 tablet 0  . predniSONE (DELTASONE) 10 MG tablet Take 4 tablets (40 mg  total) by mouth daily with breakfast. (Patient taking differently: Take 30 mg by mouth daily with breakfast. ) 120 tablet 0  . predniSONE (DELTASONE) 5 MG tablet Take 1 tab per day. 60 tablet 0  . pyridOXINE (VITAMIN B-6) 50 MG tablet Take 50 mg by mouth daily.    . TUBERCULIN SYR 1CC/27GX1/2" 27G X 1/2" 1 ML MISC Patient to use weekly to inject Methotrexate 12 each 3  . TURMERIC PO Take by mouth daily.    Marland Kitchen VITAMIN K PO Take by mouth.     No current facility-administered medications for this visit.     Physical Exam  BP (!) 154/75 (BP Location: Right Arm, Patient Position: Sitting, Cuff Size: Large)   Pulse 73   Resp 16   Ht 5\' 6"  (1.676 m)   Wt 166 lb (75.3 kg)   SpO2 98% Comment: RA  BMI 26.34 kg/m  67 year old woman in no acute distress Alert and oriented x3 with no focal deficits Mild cushingoid appearance No cervical or subclavicular adenopathy Cardiac regular rate and rhythm normal  S1-S2 Lungs clear with equal breath sounds bilaterally  Diagnostic Tests: CT CHEST WITHOUT CONTRAST  TECHNIQUE: Multidetector CT imaging of the chest was performed following the standard protocol without IV contrast.  COMPARISON:  02/23/2017  FINDINGS: Cardiovascular: Aortic atherosclerosis. Normal heart size, without pericardial effusion.  Mediastinum/Nodes: No supraclavicular adenopathy. No middle mediastinal or hilar adenopathy. Tiny hiatal hernia.  Anterior mediastinal soft tissue density lesion measures 2.3 by 1.0 cm on image 68/2 versus 2.4 x 1.0 cm on the prior. 3.8 cm craniocaudal on coronal image 38 today versus 5.1 cm at the same level on the prior.  Lungs/Pleura: No pleural fluid.  Clear lungs.  Upper Abdomen: Normal imaged portions of the liver, spleen, pancreas, adrenal glands, kidneys, gallbladder.  Musculoskeletal: Bilateral calcified breast implants. Similar appearance of right-sided extracapsular rupture. No acute osseous abnormality.  IMPRESSION: 1. Slight decrease in size of anterior mediastinal soft tissue density, again favoring a benign etiology. Favor thymic hyperplasia. 2. Coronary artery atherosclerosis. Aortic Atherosclerosis (ICD10-I70.0). 3.  Tiny hiatal hernia.   Electronically Signed   By: Abigail Miyamoto M.D.   On: 08/31/2017 10:53 I personally reviewed the CT images and concur with the findings noted above.  Impression: Marisa Orozco is a 67 year old woman with a history of dermatomyositis and Sjogren's syndrome.  She was found to have an anterior mediastinal mass back in 2017.  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  We discussed potential benefits of thymectomy as relates to her autoimmune disease.  She did not wish to have surgery.  The thymic hyperplasia is actually slightly smaller on today's exam than it was previously.  This would argue against a neoplastic source.  This could be due to the prednisone.  Unfortunately she is  developing some sequelae from the high dose of prednisone.  We again discussed that there have been reports of improvement with Sjogren's syndrome after thymectomy.  This is not as well established as the results with myasthenia gravis.  She does not wish to have surgery.  Plan: Return in 1 year with CT chest to follow-up anterior mediastinal abnormality  Melrose Nakayama, MD [facility] [phone]

## 2017-09-01 ENCOUNTER — Other Ambulatory Visit: Payer: Self-pay

## 2017-09-01 ENCOUNTER — Encounter: Payer: Self-pay | Admitting: Rheumatology

## 2017-09-01 DIAGNOSIS — M3313 Other dermatomyositis without myopathy: Secondary | ICD-10-CM

## 2017-09-01 DIAGNOSIS — M3390 Dermatopolymyositis, unspecified, organ involvement unspecified: Secondary | ICD-10-CM

## 2017-09-01 DIAGNOSIS — Z79899 Other long term (current) drug therapy: Secondary | ICD-10-CM

## 2017-09-02 ENCOUNTER — Ambulatory Visit: Payer: Medicare Other

## 2017-09-02 ENCOUNTER — Ambulatory Visit: Payer: Medicare Other | Admitting: Physical Therapy

## 2017-09-02 ENCOUNTER — Other Ambulatory Visit: Payer: Self-pay

## 2017-09-02 DIAGNOSIS — M79605 Pain in left leg: Secondary | ICD-10-CM

## 2017-09-02 DIAGNOSIS — M6281 Muscle weakness (generalized): Secondary | ICD-10-CM | POA: Diagnosis not present

## 2017-09-02 DIAGNOSIS — R262 Difficulty in walking, not elsewhere classified: Secondary | ICD-10-CM

## 2017-09-02 DIAGNOSIS — M79604 Pain in right leg: Secondary | ICD-10-CM | POA: Diagnosis not present

## 2017-09-02 DIAGNOSIS — M3390 Dermatopolymyositis, unspecified, organ involvement unspecified: Secondary | ICD-10-CM | POA: Diagnosis not present

## 2017-09-02 DIAGNOSIS — Z79899 Other long term (current) drug therapy: Secondary | ICD-10-CM | POA: Diagnosis not present

## 2017-09-02 NOTE — Therapy (Signed)
[place] MAIN [facility] SERVICES [address] [place], [place], [address] Phone: [phone]   Fax:  [phone]  Physical Therapy Treatment  Patient Details  Name: Marisa Orozco MRN: [id number] Date of Birth: January 15, 1951 Referring Provider: Bo Merino, MD (Rheumatology)   Encounter Date: 09/02/2017  PT End of Session - 09/02/17 1637    Visit Number  13    Number of Visits  13    Date for PT Re-Evaluation  08/31/17    PT Start Time  1210    PT Stop Time  1315    PT Time Calculation (min)  65 min    Activity Tolerance  Patient tolerated treatment well    Behavior During Therapy  [facility] for tasks assessed/performed       Past Medical History:  Diagnosis Date  . Allergy   . Cancer ([place])    basal cell carcinoma - face  . 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 Former smoker 02/04/2016  . GERD (gastroesophageal reflux disease)    otc med prn  . PONV (postoperative nausea and vomiting)   . Sjogren's disease ([place]) 02/04/2016   Positive ANA, Positive Ro, Positive CCP, Negative RF, Parotitis, Positive Sicca  . Sjogren's syndrome ([place])    from MD note    Past Surgical History:  Procedure Laterality Date  . BREAST SURGERY     augmentation  . BUNIONECTOMY     right foot  . COLONOSCOPY    . DIAGNOSTIC LAPAROSCOPY    . DILATION AND CURETTAGE OF UTERUS  04/15/2011   Procedure: DILATATION AND CURETTAGE;  Surgeon: [name];  Location: [place] [facility];  Service: Gynecology;  Laterality: N/A;  . MUSCLE BIOPSY Left 01/02/2016   Procedure: MUSCLE BIOPSY LEFT DELTOID;  Surgeon: [name], MD;  Location: [place];  Service: General;  Laterality: Left;  left deltoid muscle bx  . NASAL SEPTUM SURGERY    . SVD     x 2  . TONSILLECTOMY    . WISDOM TOOTH EXTRACTION      There were no vitals filed for this visit.  Subjective Assessment - 09/02/17 1634    Subjective  Pt  reports no pain currently or post last session. Overall feeling fairly well; notes slowly tapering steroid use.     Pertinent History  prolonged use of Prednisone - is weaning down; thymoma, Sjogren's disease, basal cell carcinoma - face, OA both hands      Enters/exits via ramp  Ambulation  2L fwd  2L sidestep  2L sidestep with squat  2L fwd lunge  Core/LE strength  Squats, green dumbbells, 20x  Side lunge, B, green dumbbells, 20x ea  Oblique fwd cruch, 1 blue dumbbell, B, 20x   Suspended work, 75 sec ea (total 5 min)  Jog  Jack   Ski  X jack  Active recovery 1L  Core with UE work, green dumbbells in stable 90/90 lunge position, 20x each  Triceps press downs  Sh abd/add  Sh flex/ext  Sh horiz abd/add  Suspended work as above 5 min  2L active recovery   Core/LE 2# ankle wts  Stand SKTC with UE overhead to knee, B, 2 x 10 ea  Fall to recover fwd 2L  Side, B, 1 L ea  Suspend as above, 5 min  High plank core/LE work, 2# ankle wts  Hip ext, B, 2 x 10 ea  Single leg jack,  B, 2 x 10 ea  Hip ext with 90 deg knee flex glut pulse, B, 2 x 10  Stretching, B, 3 x 10 sec each  Seated hamstrings  Seated fig 4 piriformis  Stand hip flexor with lateral reach                         PT Education - 09/02/17 1635    Education provided  Yes    Education Details  progression of core work, suspended work intervals with strength circuits, fall to recover abdominal work, High plank position core with LE strengthening    Person(s) Educated  Patient    Methods  Explanation;Demonstration    Comprehension  Verbalized understanding;Returned demonstration;Verbal cues required       PT Short Term Goals - 08/23/17 1130      PT SHORT TERM GOAL #1   Title  Pt will demonstrate independence with ongoing MedBridge HEP (QW6FDVCE)  (All STG due 09/30/17)    Time  4    Period  Weeks    Status  Achieved    Target Date  09/30/17      PT SHORT TERM GOAL #2   Title   Patient will initiate community wellness program at [employer] or [employer] with pool available and initiate aquatic HEP.    Baseline  --    Time  4    Period  Weeks    Status  New    Target Date  09/30/17      PT SHORT TERM GOAL #3   Title  Pt will improve MMT of proximal hip mm to 5/5 bilaterally    Baseline  --    Time  4    Period  Weeks    Status  Revised    Target Date  09/30/17      PT SHORT TERM GOAL #4   Title  Pt will negotiate 8 stairs alternating sequence, MOD I without rails    Time  4    Period  Weeks    Status  Revised    Target Date  09/30/17      PT SHORT TERM GOAL #5   Title  --    Time  --    Period  --    Status  --        PT Long Term Goals - 08/23/17 1129      PT LONG TERM GOAL #1   Title  Pt will demonstrate independence with final gym/wellness HEP, walking program and aquatic exercise  (All LTG due 7/27)    Time  8    Period  Weeks    Status  On-going    Target Date  10/30/17      PT LONG TERM GOAL #2   Title  Pt will improve functional LE strength as indicated by decrease in five time sit to stand time to </= 10 seconds    Baseline  12 seconds    Time  8    Period  Weeks    Status  On-going    Target Date  10/30/17      PT LONG TERM GOAL #3   Title  Pt will report being able to stand from bath tub (without going to hands and knees) independently at home and will perform floor > stand with UE support MOD I    Time  8    Period  Weeks    Status  On-going  Target Date  10/30/17      PT LONG TERM GOAL #4   Title  Pt will negotiate 12 stairs, no rails MOD I    Time  8    Period  Weeks    Status  On-going    Target Date  10/30/17            Plan - 09/02/17 1637    Clinical Impression Statement  Pt tolerated session well. Demonstrated great work with suspended work for strengthening and increased cardio efforts. Progressed abdominal work and LE work with less support in Actuary. Plan to continue to progress with  eventual plan for sustainable aquatic HEP    Rehab Potential  Fair    Clinical Impairments Affecting Rehab Potential  active autoimmune disease    PT Frequency  2x / week    PT Duration  8 weeks PT re-assessment every 30 days    PT Treatment/Interventions  ADLs/Self Care Home Management;Aquatic Therapy;Moist Heat;Gait training;Stair training;Functional mobility training;Therapeutic activities;Therapeutic exercise;Patient/family education;Passive range of motion;Energy conservation;Neuromuscular re-education;Balance training    PT Next Visit Plan  Aquatic.    Neuro: assess STG on 6/26    PT Home Exercise Plan  walking program, aquatic therapy, gentle strengthening with isometric exercises progressing to low weight/low resistance of proximal mm, balance    Consulted and Agree with Plan of Care  Patient       Patient will benefit from skilled therapeutic intervention in order to improve the following deficits and impairments:  Decreased strength, Difficulty walking, Impaired UE functional use, Pain, Decreased balance  Visit Diagnosis: Muscle weakness (generalized)  Difficulty in walking, not elsewhere classified  Pain in left leg  Pain in right leg     Problem List Patient Active Problem List   Diagnosis Date Noted  . High risk medication use 04/07/2016  . Sjogren's disease ([place]) 02/04/2016  . 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 Former smoker 02/04/2016  . 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 Fatigue 02/15/2012    Larae Grooms 09/02/2017, 4:40 PM  [place] MAIN [facility] SERVICES [address] [place], [place], [address] Phone: [phone]   Fax:  [phone]  Name: DELAYNI STREED MRN: [id number] Date of Birth: 1951-03-22

## 2017-09-03 ENCOUNTER — Encounter: Payer: Self-pay | Admitting: Rheumatology

## 2017-09-03 LAB — COMPLETE METABOLIC PANEL WITH GFR
AG RATIO: 1.8 (calc) (ref 1.0–2.5)
ALKALINE PHOSPHATASE (APISO): 49 U/L (ref 33–130)
ALT: 30 U/L — ABNORMAL HIGH (ref 6–29)
AST: 20 U/L (ref 10–35)
Albumin: 4.1 g/dL (ref 3.6–5.1)
BILIRUBIN TOTAL: 0.6 mg/dL (ref 0.2–1.2)
BUN: 20 mg/dL (ref 7–25)
CHLORIDE: 106 mmol/L (ref 98–110)
CO2: 27 mmol/L (ref 20–32)
Calcium: 9 mg/dL (ref 8.6–10.4)
Creat: 0.77 mg/dL (ref 0.50–0.99)
GFR, Est African American: 93 mL/min/{1.73_m2} (ref >=60)
GFR, Est Non African American: 80 mL/min/{1.73_m2} (ref >=60)
GLOBULIN: 2.3 g/dL (ref 1.9–3.7)
Glucose, Bld: 73 mg/dL (ref 65–99)
POTASSIUM: 4 mmol/L (ref 3.5–5.3)
SODIUM: 142 mmol/L (ref 135–146)
Total Protein: 6.4 g/dL (ref 6.1–8.1)

## 2017-09-03 LAB — CBC WITH DIFFERENTIAL/PLATELET
BASOS ABS: 39 {cells}/uL (ref 0–200)
BASOS PCT: 0.4 %
EOS PCT: 2.2 %
Eosinophils Absolute: 213 cells/uL (ref 15–500)
HCT: 43.6 % (ref 35.0–45.0)
HEMOGLOBIN: 14.5 g/dL (ref 11.7–15.5)
LYMPHS ABS: 2949 {cells}/uL (ref 850–3900)
MCH: 28.7 pg (ref 27.0–33.0)
MCHC: 33.3 g/dL (ref 32.0–36.0)
MCV: 86.3 fL (ref 80.0–100.0)
MONOS PCT: 7.9 %
MPV: 10.8 fL (ref 7.5–12.5)
NEUTROS ABS: 5733 {cells}/uL (ref 1500–7800)
Neutrophils Relative %: 59.1 %
Platelets: 222 10*3/uL (ref 140–400)
RBC: 5.05 10*6/uL (ref 3.80–5.10)
RDW: 15.7 % — ABNORMAL HIGH (ref 11.0–15.0)
Total Lymphocyte: 30.4 %
WBC mixed population: 766 cells/uL (ref 200–950)
WBC: 9.7 10*3/uL (ref 3.8–10.8)

## 2017-09-03 LAB — CK: CK TOTAL: 28 U/L — AB (ref 29–143)

## 2017-09-03 NOTE — Progress Notes (Signed)
Labs are stable.

## 2017-09-08 ENCOUNTER — Encounter: Payer: Self-pay | Admitting: Rheumatology

## 2017-09-08 ENCOUNTER — Ambulatory Visit (INDEPENDENT_AMBULATORY_CARE_PROVIDER_SITE_OTHER): Payer: Medicare Other | Admitting: Rheumatology

## 2017-09-08 VITALS — BP 120/61 | HR 65 | Resp 14 | Ht 66.0 in | Wt 167.5 lb

## 2017-09-08 DIAGNOSIS — M3501 Sicca syndrome with keratoconjunctivitis: Secondary | ICD-10-CM | POA: Diagnosis not present

## 2017-09-08 DIAGNOSIS — R5383 Other fatigue: Secondary | ICD-10-CM | POA: Diagnosis not present

## 2017-09-08 DIAGNOSIS — D150 Benign neoplasm of thymus: Secondary | ICD-10-CM | POA: Diagnosis not present

## 2017-09-08 DIAGNOSIS — D4989 Neoplasm of unspecified behavior of other specified sites: Secondary | ICD-10-CM

## 2017-09-08 DIAGNOSIS — Z87891 Personal history of nicotine dependence: Secondary | ICD-10-CM

## 2017-09-08 DIAGNOSIS — Z79899 Other long term (current) drug therapy: Secondary | ICD-10-CM

## 2017-09-08 DIAGNOSIS — M3390 Dermatopolymyositis, unspecified, organ involvement unspecified: Secondary | ICD-10-CM

## 2017-09-08 DIAGNOSIS — M19042 Primary osteoarthritis, left hand: Secondary | ICD-10-CM

## 2017-09-08 DIAGNOSIS — M19041 Primary osteoarthritis, right hand: Secondary | ICD-10-CM

## 2017-09-08 NOTE — Patient Instructions (Signed)
Standing Labs We placed an order today for your standing lab work.    Please come back and get your standing labs in  CBC, CMP and CK every  month  We have open lab Monday through Friday from 8:30-11:30 AM and 1:30-4:00 PM  at the office of Dr. Bo Merino.   You may experience shorter wait times on Monday and Friday afternoons. The office is located at [address] [place], [place], [place] [address] No appointment is necessary.   Labs are drawn by [facility].  You may receive a bill from [facility] for your lab work. If you have any questions regarding directions or hours of operation,  please call [phone].

## 2017-09-09 ENCOUNTER — Ambulatory Visit: Payer: Medicare Other

## 2017-09-13 DIAGNOSIS — C37 Malignant neoplasm of thymus: Secondary | ICD-10-CM | POA: Diagnosis not present

## 2017-09-13 DIAGNOSIS — E119 Type 2 diabetes mellitus without complications: Secondary | ICD-10-CM | POA: Diagnosis not present

## 2017-09-13 DIAGNOSIS — Z9189 Other specified personal risk factors, not elsewhere classified: Secondary | ICD-10-CM | POA: Diagnosis not present

## 2017-09-13 DIAGNOSIS — R002 Palpitations: Secondary | ICD-10-CM | POA: Diagnosis not present

## 2017-09-13 DIAGNOSIS — E8881 Metabolic syndrome: Secondary | ICD-10-CM | POA: Diagnosis not present

## 2017-09-13 DIAGNOSIS — E782 Mixed hyperlipidemia: Secondary | ICD-10-CM | POA: Diagnosis not present

## 2017-09-13 DIAGNOSIS — E039 Hypothyroidism, unspecified: Secondary | ICD-10-CM | POA: Diagnosis not present

## 2017-09-13 DIAGNOSIS — M1990 Unspecified osteoarthritis, unspecified site: Secondary | ICD-10-CM | POA: Diagnosis not present

## 2017-09-13 DIAGNOSIS — K210 Gastro-esophageal reflux disease with esophagitis: Secondary | ICD-10-CM | POA: Diagnosis not present

## 2017-09-14 ENCOUNTER — Ambulatory Visit: Payer: Medicare Other | Attending: Rheumatology

## 2017-09-14 ENCOUNTER — Other Ambulatory Visit: Payer: Self-pay

## 2017-09-14 DIAGNOSIS — R262 Difficulty in walking, not elsewhere classified: Secondary | ICD-10-CM | POA: Diagnosis not present

## 2017-09-14 DIAGNOSIS — M79605 Pain in left leg: Secondary | ICD-10-CM | POA: Diagnosis not present

## 2017-09-14 DIAGNOSIS — M6281 Muscle weakness (generalized): Secondary | ICD-10-CM | POA: Insufficient documentation

## 2017-09-14 DIAGNOSIS — M79604 Pain in right leg: Secondary | ICD-10-CM | POA: Diagnosis not present

## 2017-09-14 NOTE — Therapy (Signed)
[place] MAIN [facility] SERVICES [address] [place], [place], [address] Phone: [phone]   Fax:  [phone]  Physical Therapy Treatment  Patient Details  Name: Marisa Orozco MRN: [id number] Date of Birth: 06-Feb-1951 Referring Provider: Bo Merino, MD (Rheumatology)   Encounter Date: 09/14/2017  PT End of Session - 09/14/17 1525    Visit Number  14    Number of Visits  21    Date for PT Re-Evaluation  10/30/17    PT Start Time  1105    PT Stop Time  1205    PT Time Calculation (min)  60 min    Activity Tolerance  Patient tolerated treatment well    Behavior During Therapy  [facility] for tasks assessed/performed       Past Medical History:  Diagnosis Date  . Allergy   . Cancer ([place])    basal cell carcinoma - face  . 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 Former smoker 02/04/2016  . GERD (gastroesophageal reflux disease)    otc med prn  . PONV (postoperative nausea and vomiting)   . Sjogren's disease ([place]) 02/04/2016   Positive ANA, Positive Ro, Positive CCP, Negative RF, Parotitis, Positive Sicca  . Sjogren's syndrome ([place])    from MD note    Past Surgical History:  Procedure Laterality Date  . BREAST SURGERY     augmentation  . BUNIONECTOMY     right foot  . COLONOSCOPY    . DIAGNOSTIC LAPAROSCOPY    . DILATION AND CURETTAGE OF UTERUS  04/15/2011   Procedure: DILATATION AND CURETTAGE;  Surgeon: [name];  Location: [facility] [place] [facility];  Service: Gynecology;  Laterality: N/A;  . MUSCLE BIOPSY Left 01/02/2016   Procedure: MUSCLE BIOPSY LEFT DELTOID;  Surgeon: [name], MD;  Location: [place];  Service: General;  Laterality: Left;  left deltoid muscle bx  . NASAL SEPTUM SURGERY    . SVD     x 2  . TONSILLECTOMY    . WISDOM TOOTH EXTRACTION      There were no vitals filed for this visit.  Subjective Assessment - 09/14/17 1513    Subjective  Pt  reports mild soreness in B hamstrings post last session for a couple of days that was not limiting, but could "feel the work" Pt denies stretching furhter post session. Currently denies pain. Pt does feels sh is getting stronger and states rheumatologist was pleased that pt was able to get down to floor and up again without use of UEs. Pt states she is tapering Prednisone use every two weeks.       Ambulation, no dumbbells today  4 L fwd  2 L side with squat  2 L lunge  Step LE strengthening/cardio  2# wts   Quick step ups, R/L lead 1 min each   Ipsilateral step up with opposite hip ext, B 20x each   Quick step ups, R/L lead, 1 min each   Ipsilateral side step up with opposite hip abd, B 20x each   Quick step ups, R/L lead, 30 sec each  High plank, strengthening  2# wts   Hip ext, B 2 x 10 each   Knee tucks, B 2 x 10 each  Bench core/LE speed work  Bike, 1 min  Scissor, 1 min  High side plank  B 2 x 1 min each  Bench core/LE speed work  [employer] 1  min  Scissor 1 min  Core with UE strength, no wall support  Red dumbbells, 20x each   Triceps press down   Sh abd/add   Flex/ext   Sh horiz abd/add  Fall to recover  Fwd 2 L  Side R/L 2 L ea  Stretching, B 3 x 15 sec ea  hamstrings  hip flexor with lateral trunk stretch                        PT Education - 09/14/17 1515    Education provided  Yes    Education Details  Step work, use of speed work in shorter intervals (1-2) minutes between several strength exercises. core with UE without wall support    Person(s) Educated  Patient    Methods  Explanation;Demonstration    Comprehension  Verbalized understanding;Returned demonstration;Verbal cues required       PT Short Term Goals - 08/23/17 1130      PT SHORT TERM GOAL #1   Title  Pt will demonstrate independence with ongoing MedBridge HEP (QW6FDVCE)  (All STG due 09/30/17)    Time  4    Period  Weeks    Status  Achieved    Target Date  09/30/17       PT SHORT TERM GOAL #2   Title  Patient will initiate community wellness program at [employer] or [employer] with pool available and initiate aquatic HEP.    Baseline  --    Time  4    Period  Weeks    Status  New    Target Date  09/30/17      PT SHORT TERM GOAL #3   Title  Pt will improve MMT of proximal hip mm to 5/5 bilaterally    Baseline  --    Time  4    Period  Weeks    Status  Revised    Target Date  09/30/17      PT SHORT TERM GOAL #4   Title  Pt will negotiate 8 stairs alternating sequence, MOD I without rails    Time  4    Period  Weeks    Status  Revised    Target Date  09/30/17      PT SHORT TERM GOAL #5   Title  --    Time  --    Period  --    Status  --        PT Long Term Goals - 08/23/17 1129      PT LONG TERM GOAL #1   Title  Pt will demonstrate independence with final gym/wellness HEP, walking program and aquatic exercise  (All LTG due 7/27)    Time  8    Period  Weeks    Status  On-going    Target Date  10/30/17      PT LONG TERM GOAL #2   Title  Pt will improve functional LE strength as indicated by decrease in five time sit to stand time to </= 10 seconds    Baseline  12 seconds    Time  8    Period  Weeks    Status  On-going    Target Date  10/30/17      PT LONG TERM GOAL #3   Title  Pt will report being able to stand from bath tub (without going to hands and knees) independently at home and will perform floor > stand with UE support  MOD I    Time  8    Period  Weeks    Status  On-going    Target Date  10/30/17      PT LONG TERM GOAL #4   Title  Pt will negotiate 12 stairs, no rails MOD I    Time  8    Period  Weeks    Status  On-going    Target Date  10/30/17            Plan - 09/14/17 1526    Clinical Impression Statement  Pt tolerated session well; some fatigue noted with speed work. Educated on slowing if needed versus stopping all together. Demonstrated core with UE work well without wall support maintaining good  neutral pelvic lunge position    Rehab Potential  Fair    Clinical Impairments Affecting Rehab Potential  active autoimmune disease    PT Frequency  2x / week    PT Duration  8 weeks PT re-assessment every 30 days    PT Treatment/Interventions  ADLs/Self Care Home Management;Aquatic Therapy;Moist Heat;Gait training;Stair training;Functional mobility training;Therapeutic activities;Therapeutic exercise;Patient/family education;Passive range of motion;Energy conservation;Neuromuscular re-education;Balance training    PT Next Visit Plan  Aquatic.    Neuro: assess STG on 6/26    PT Home Exercise Plan  walking program, aquatic therapy, gentle strengthening with isometric exercises progressing to low weight/low resistance of proximal mm, balance    Consulted and Agree with Plan of Care  Patient       Patient will benefit from skilled therapeutic intervention in order to improve the following deficits and impairments:  Decreased strength, Difficulty walking, Impaired UE functional use, Pain, Decreased balance  Visit Diagnosis: Muscle weakness (generalized)  Difficulty in walking, not elsewhere classified  Pain in left leg  Pain in right leg     Problem List Patient Active Problem List   Diagnosis Date Noted  . Primary osteoarthritis of both hands 09/08/2017  . High risk medication use 04/07/2016  . Sjogren's disease ([place]) 02/04/2016  . 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 Former smoker 02/04/2016  . 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 Fatigue 02/15/2012    Larae Grooms 09/14/2017, 3:29 PM  [place] MAIN [facility] SERVICES [address] [place], [place], [address] Phone: [phone]   Fax:  [phone]  Name: OCEANE FOSSE MRN: [id number] Date of Birth: [birth year]

## 2017-09-15 DIAGNOSIS — E8881 Metabolic syndrome: Secondary | ICD-10-CM | POA: Diagnosis not present

## 2017-09-15 DIAGNOSIS — Z1331 Encounter for screening for depression: Secondary | ICD-10-CM | POA: Diagnosis not present

## 2017-09-15 DIAGNOSIS — Z6826 Body mass index (BMI) 26.0-26.9, adult: Secondary | ICD-10-CM | POA: Diagnosis not present

## 2017-09-15 DIAGNOSIS — Z1389 Encounter for screening for other disorder: Secondary | ICD-10-CM | POA: Diagnosis not present

## 2017-09-15 DIAGNOSIS — I700 Atherosclerosis of aorta: Secondary | ICD-10-CM | POA: Diagnosis not present

## 2017-09-15 DIAGNOSIS — R7301 Impaired fasting glucose: Secondary | ICD-10-CM | POA: Diagnosis not present

## 2017-09-15 DIAGNOSIS — E782 Mixed hyperlipidemia: Secondary | ICD-10-CM | POA: Diagnosis not present

## 2017-09-15 DIAGNOSIS — K7581 Nonalcoholic steatohepatitis (NASH): Secondary | ICD-10-CM | POA: Diagnosis not present

## 2017-09-16 ENCOUNTER — Ambulatory Visit: Payer: Medicare Other

## 2017-09-21 ENCOUNTER — Other Ambulatory Visit: Payer: Self-pay

## 2017-09-21 ENCOUNTER — Ambulatory Visit: Payer: Medicare Other

## 2017-09-21 DIAGNOSIS — M6281 Muscle weakness (generalized): Secondary | ICD-10-CM | POA: Diagnosis not present

## 2017-09-21 DIAGNOSIS — M79604 Pain in right leg: Secondary | ICD-10-CM | POA: Diagnosis not present

## 2017-09-21 DIAGNOSIS — R262 Difficulty in walking, not elsewhere classified: Secondary | ICD-10-CM | POA: Diagnosis not present

## 2017-09-21 DIAGNOSIS — M79605 Pain in left leg: Secondary | ICD-10-CM | POA: Diagnosis not present

## 2017-09-21 NOTE — Therapy (Signed)
[place] MAIN [facility] SERVICES [address] [place], [place], [address] Phone: [phone]   Fax:  [phone]  Physical Therapy Treatment  Patient Details  Name: Marisa Orozco MRN: [id number] Date of Birth: [birth year] Referring Provider: Bo Merino, MD (Rheumatology)   Encounter Date: 09/21/2017  PT End of Session - 09/21/17 1556    Visit Number  15    Number of Visits  21    Date for PT Re-Evaluation  10/30/17    Authorization Type  Medicare, UHC    PT Start Time  1200    PT Stop Time  1300    PT Time Calculation (min)  60 min    Activity Tolerance  Patient tolerated treatment well    Behavior During Therapy  [facility] for tasks assessed/performed       Past Medical History:  Diagnosis Date  . Allergy   . Cancer ([place])    basal cell carcinoma - face  . 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 Former smoker 02/04/2016  . GERD (gastroesophageal reflux disease)    otc med prn  . PONV (postoperative nausea and vomiting)   . Sjogren's disease ([place]) 02/04/2016   Positive ANA, Positive Ro, Positive CCP, Negative RF, Parotitis, Positive Sicca  . Sjogren's syndrome ([place])    from MD note    Past Surgical History:  Procedure Laterality Date  . BREAST SURGERY     augmentation  . BUNIONECTOMY     right foot  . COLONOSCOPY    . DIAGNOSTIC LAPAROSCOPY    . DILATION AND CURETTAGE OF UTERUS  04/15/2011   Procedure: DILATATION AND CURETTAGE;  Surgeon: [name];  Location: [place] [facility];  Service: Gynecology;  Laterality: N/A;  . MUSCLE BIOPSY Left 01/02/2016   Procedure: MUSCLE BIOPSY LEFT DELTOID;  Surgeon: [name], MD;  Location: [place];  Service: General;  Laterality: Left;  left deltoid muscle bx  . NASAL SEPTUM SURGERY    . SVD     x 2  . TONSILLECTOMY    . WISDOM TOOTH EXTRACTION      There were no vitals filed for this visit.  Subjective Assessment -  09/21/17 1554    Subjective  Denies problems post last session; no complaints of pain/problems currently.     Pertinent History  prolonged use of Prednisone - is weaning down; thymoma, Sjogren's disease, basal cell carcinoma - face, OA both hands      Enters/exits via ramp  Ambulation  Fwd 2L  Side with squat 2L  Fwd lunging 2L  Mod High plank speed work (using bench); increased time with rest between exercises for education; 75 sec each  Mountain climber  Cross tuck  Barnabas Lister  Quick hip ext  LE strength, blue dumbells  Squats, 20x  Side lunges, 1 blue dumbbell, B 20x ea   Speed work in [employer] high plank, 75 sec each (5 min total)  Mountain climber  Cross tuck  Barnabas Lister   Quick hip ext   Fall to recover, super noodle  Fwd 2L  Side B 2 L each  Speed work 5 min total as above  Core strengthening  Stand abdominal crunch, 1 blue dumbell, 30x  Stand oblique crunch, 1 blue dumbbell, 30x ea  Stand knee arms overhead to knee lift crunch, R/L 30x ea  Side plank, B  1 min hold each   with top leg knee crunch, 10x  ea  with top leg SLR, 10x ea                         PT Education - 09/21/17 1554    Education provided  Yes    Education Details  speed/cardio work in Haematologist. Continued work on core/LE strength with use of strength and cardio circuits. Progressed side planks       PT Short Term Goals - 08/23/17 1130      PT SHORT TERM GOAL #1   Title  Pt will demonstrate independence with ongoing MedBridge HEP (QW6FDVCE)  (All STG due 09/30/17)    Time  4    Period  Weeks    Status  Achieved    Target Date  09/30/17      PT SHORT TERM GOAL #2   Title  Patient will initiate community wellness program at [employer] or [employer] with pool available and initiate aquatic HEP.    Baseline  --    Time  4    Period  Weeks    Status  New    Target Date  09/30/17      PT SHORT TERM GOAL #3   Title  Pt will improve MMT of proximal hip mm to 5/5 bilaterally     Baseline  --    Time  4    Period  Weeks    Status  Revised    Target Date  09/30/17      PT SHORT TERM GOAL #4   Title  Pt will negotiate 8 stairs alternating sequence, MOD I without rails    Time  4    Period  Weeks    Status  Revised    Target Date  09/30/17      PT SHORT TERM GOAL #5   Title  --    Time  --    Period  --    Status  --        PT Long Term Goals - 08/23/17 1129      PT LONG TERM GOAL #1   Title  Pt will demonstrate independence with final gym/wellness HEP, walking program and aquatic exercise  (All LTG due 7/27)    Time  8    Period  Weeks    Status  On-going    Target Date  10/30/17      PT LONG TERM GOAL #2   Title  Pt will improve functional LE strength as indicated by decrease in five time sit to stand time to </= 10 seconds    Baseline  12 seconds    Time  8    Period  Weeks    Status  On-going    Target Date  10/30/17      PT LONG TERM GOAL #3   Title  Pt will report being able to stand from bath tub (without going to hands and knees) independently at home and will perform floor > stand with UE support MOD I    Time  8    Period  Weeks    Status  On-going    Target Date  10/30/17      PT LONG TERM GOAL #4   Title  Pt will negotiate 12 stairs, no rails MOD I    Time  8    Period  Weeks    Status  On-going    Target Date  10/30/17  Plan - 09/21/17 1556    Clinical Impression Statement  Pt tolerated session well. Requires additional cueing to maintain good form with high plank during speed work, but improving throughout session. Side plank progression challenging, but a good challenge that will promote strength. Continue to progress strength    Rehab Potential  Fair    Clinical Impairments Affecting Rehab Potential  active autoimmune disease    PT Frequency  2x / week    PT Duration  8 weeks PT re-assessment every 30 days    PT Treatment/Interventions  ADLs/Self Care Home Management;Aquatic Therapy;Moist Heat;Gait  training;Stair training;Functional mobility training;Therapeutic activities;Therapeutic exercise;Patient/family education;Passive range of motion;Energy conservation;Neuromuscular re-education;Balance training    PT Next Visit Plan  Aquatic.    Neuro: assess STG on 6/26    PT Home Exercise Plan  walking program, aquatic therapy, gentle strengthening with isometric exercises progressing to low weight/low resistance of proximal mm, balance    Consulted and Agree with Plan of Care  Patient       Patient will benefit from skilled therapeutic intervention in order to improve the following deficits and impairments:  Decreased strength, Difficulty walking, Impaired UE functional use, Pain, Decreased balance  Visit Diagnosis: Muscle weakness (generalized)  Difficulty in walking, not elsewhere classified  Pain in left leg  Pain in right leg     Problem List Patient Active Problem List   Diagnosis Date Noted  . Primary osteoarthritis of both hands 09/08/2017  . High risk medication use 04/07/2016  . Sjogren's disease ([place]) 02/04/2016  . 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 Former smoker 02/04/2016  . 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 Fatigue 02/15/2012    Larae Grooms 09/21/2017, 3:59 PM  [place] MAIN [facility] SERVICES [address] [place], [place], [address] Phone: [phone]   Fax:  [phone]  Name: STEPAHNIE CAMPO MRN: [id number] Date of Birth: December 11, 1950

## 2017-09-22 NOTE — Progress Notes (Signed)
Cardiology Office Note    Date:  09/23/2017   ID:  Marisa Orozco, DOB [birth year], MRN [id number]  PCP:  Caryl Bis, MD  Cardiologist:   Candee Furbish, MD     History of Present Illness:  Marisa Orozco is a 67 y.o. female here for follow-up palpitations. Seen on 01/01/16 by Cecilie Kicks, NP. Prior echo showed normal ejection fraction of 65-70% with grade 1 diastolic dysfunction. Heart rate has been 110 in the past with no awareness of this. Minimal ankle edema improved off of prednisone.  She has not described any palpitations recently. 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 Overall she is doing quite well.  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 See below. Has a PET scan upcoming.  09/23/2017 - mtx prednisone, flair   Past Medical History:  Diagnosis Date  . Allergy   . Cancer ([place])    basal cell carcinoma - face  . 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 Former smoker 02/04/2016  . GERD (gastroesophageal reflux disease)    otc med prn  . PONV (postoperative nausea and vomiting)   . Sjogren's disease ([place]) 02/04/2016   Positive ANA, Positive Ro, Positive CCP, Negative RF, Parotitis, Positive Sicca  . Sjogren's syndrome ([place])    from MD note    Past Surgical History:  Procedure Laterality Date  . BREAST SURGERY     augmentation  . BUNIONECTOMY     right foot  . COLONOSCOPY    . DIAGNOSTIC LAPAROSCOPY    . DILATION AND CURETTAGE OF UTERUS  04/15/2011   Procedure: DILATATION AND CURETTAGE;  Surgeon: [name];  Location: [facility];  Service: Gynecology;  Laterality: N/A;  . MUSCLE BIOPSY Left 01/02/2016   Procedure: MUSCLE BIOPSY LEFT DELTOID;  Surgeon: [name], MD;  Location: [facility];  Service: General;  Laterality: Left;  left deltoid muscle bx  . NASAL SEPTUM SURGERY    . SVD     x 2    . TONSILLECTOMY    . WISDOM TOOTH EXTRACTION      Current Medications: Outpatient Medications Prior to Visit  Medication Sig Dispense Refill  . BIOTIN PO Take 8 mg by mouth daily.    Marland Kitchen BORON PO Take 30 mg by mouth daily.     . Calcium Citrate-Vitamin D (CALCIUM CITRATE+D3 PO) Take by mouth daily.    . Cholecalciferol (VITAMIN D3) 1000 UNIT/SPRAY LIQD Take 5 sprays by mouth daily.     . Coenzyme Q10 (COQ10) 100 MG CAPS Take by mouth every morning.    . folic acid (FOLVITE) 1 MG tablet Take 2 mg by mouth daily.    . Ginger, Zingiber officinalis, (GINGER ROOT PO) Take 1 tablet by mouth daily.     . methotrexate 50 MG/2ML injection Inject 50 mg/m2 into the muscle once a week.    . Multiple Vitamins-Minerals (MULTIVITAMIN ADULT PO) Take 1 tablet by mouth daily.     . predniSONE (DELTASONE) 10 MG tablet Take 25 mg by mouth daily with breakfast.    . TUBERCULIN SYR 1CC/27GX1/2" 27G X 1/2" 1 ML MISC Patient to use weekly to inject Methotrexate 12 each 3  . TURMERIC PO Take 1 capsule by mouth daily.     Marland Kitchen VITAMIN K PO Take 1 tablet by mouth daily.     Marland Kitchen  METHOTREXATE SODIUM IJ Inject 0.8 mLs as directed once a week.    . folic acid (FOLVITE) 1 MG tablet Take 1 tablet (1 mg total) daily by mouth. (Patient taking differently: Take 2 mg by mouth daily. ) 90 tablet 3  . MAGNESIUM MALATE PO Take by mouth.    . methotrexate 50 MG/2ML injection Inject 0.4 mL for 2 weeks. Increase to 0.6 mL for 2 weeks. Increase to 0.8 mL for 2 weeks. Stay on this dose. (Patient taking differently: Inject 50 mg/m2 into the muscle once a week. Patient takes 0.18mL once a week) 4 mL 2  . MILK THISTLE PO Take by mouth daily.    Marland Kitchen OVER THE COUNTER MEDICATION     . predniSONE (DELTASONE) 1 MG tablet Take 4 tablets (4 mg total) by mouth daily with breakfast. (Patient not taking: Reported on 09/08/2017) 120 tablet 0  . predniSONE (DELTASONE) 10 MG tablet Take 4 tablets (40 mg total) by mouth daily with breakfast. (Patient not  taking: Reported on 09/23/2017) 120 tablet 0  . predniSONE (DELTASONE) 5 MG tablet Take 1 tab per day. (Patient not taking: Reported on 09/08/2017) 60 tablet 0   No facility-administered medications prior to visit.      Allergies:   Codeine; Flagyl [metronidazole hcl]; Nexium [esomeprazole magnesium]; Imuran [azathioprine]; Sulfa antibiotics; and Sulfa drugs cross reactors   Social History   Socioeconomic History  . Marital status: Married    Spouse name: Not on file  . Number of children: Not on file  . Years of education: Not on file  . Highest education level: Not on file  Occupational History  . Not on file  Social Needs  . Financial resource strain: Not on file  . Food insecurity:    Worry: Not on file    Inability: Not on file  . Transportation needs:    Medical: Not on file    Non-medical: Not on file  Tobacco Use  . Smoking status: Former Smoker    Packs/day: 1.00    Years: 25.00    Pack years: 25.00    Types: Cigarettes    Last attempt to quit: 08/05/2003    Years since quitting: 14.1  . Smokeless tobacco: Never Used  Substance and Sexual Activity  . Alcohol use: Not Currently    Comment: socially - wine  . Drug use: No  . Sexual activity: Yes    Birth control/protection: None  Lifestyle  . Physical activity:    Days per week: Not on file    Minutes per session: Not on file  . Stress: Not on file  Relationships  . Social connections:    Talks on phone: Not on file    Gets together: Not on file    Attends religious service: Not on file    Active member of club or organization: Not on file    Attends meetings of clubs or organizations: Not on file    Relationship status: Not on file  Other Topics Concern  . Not on file  Social History Narrative  . Not on file     Family History:  The patient's family history includes COPD in her mother; Diabetes in her father; Epilepsy in her daughter; Heart disease in her mother; Kidney disease in her father.   ROS:     Please see the history of present illness.    Review of Systems  All other systems reviewed and are negative.    PHYSICAL EXAM:   VS:  BP 120/80   Pulse (!) 59   Ht 5\' 6"  (1.676 m)   Wt 168 lb (76.2 kg)   BMI 27.12 kg/m    GEN: Well nourished, well developed, in no acute distress  HEENT: normal  Neck: no JVD, carotid bruits, or masses Cardiac: RRR; no murmurs, rubs, or gallops,no edema  Respiratory:  clear to auscultation bilaterally, normal work of breathing GI: soft, nontender, nondistended, + BS MS: no deformity or atrophy  Skin: warm and dry, no rash Neuro:  Alert and Oriented x 3, Strength and sensation are intact Psych: euthymic mood, full affect   Wt Readings from Last 3 Encounters:  09/23/17 168 lb (76.2 kg)  09/08/17 167 lb 8 oz (76 kg)  08/31/17 166 lb (75.3 kg)      Studies/Labs Reviewed:   EKG: 09/23/2017-sinus bradycardia rate 59 with no other abnormalities personally viewed  Echocardiogram 12/23/2015 - Left ventricle: The cavity size was normal. There was mild focal   basal hypertrophy of the septum. Systolic function was vigorous.   The estimated ejection fraction was in the range of 65% to 70%.   Wall motion was normal; there were no regional wall motion   abnormalities. Doppler parameters are consistent with abnormal   left ventricular relaxation (grade 1 diastolic dysfunction).   Doppler parameters are consistent with elevated ventricular   end-diastolic filling pressure. - Aortic valve: Trileaflet; normal thickness leaflets.   Transvalvular velocity was within the normal range. There was no   stenosis. There was no regurgitation. - Aortic root: The aortic root was normal in size. - Ascending aorta: The ascending aorta was normal in size. - Mitral valve: Structurally normal valve. There was no   regurgitation. - Left atrium: The atrium was normal in size. - Right ventricle: The cavity size was normal. Wall thickness was   normal. Systolic  function was normal. - Tricuspid valve: There was trivial regurgitation. - Pulmonic valve: There was no regurgitation. - Pulmonary arteries: The main pulmonary artery was normal-sized.   Systolic pressure was within the normal range. - Inferior vena cava: The vessel was normal in size. - Pericardium, extracardiac: There was no pericardial effusion.  Recent Labs: 09/02/2017: ALT 30; BUN 20; Creat 0.77; Hemoglobin 14.5; Platelets 222; Potassium 4.0; Sodium 142   Lipid Panel No results found for: CHOL, TRIG, HDL, CHOLHDL, VLDL, LDLCALC, LDLDIRECT  Additional studies/ records that were reviewed today include:  Prior records reviewed, echo, lab work    ASSESSMENT:    1. Palpitations   2. Dermatomyositis ([place])   3. Sjogren's syndrome with myopathy ([place])      PLAN:  In order of problems listed above:  Sjogren's syndrome/dermatomyositis  -Doing very well, normal echocardiogram.  She is currently tapering down prednisone.  She is on methotrexate.  - No signs of infiltrative disease.  - It may be reasonable to repeat echocardiogram in 5 years, or sooner if symptoms develop.  No changes at this point.  Thymus mass   - She has seen both Dr. Roxan Hockey and Dr. Maryjane Hurter at [facility].  - CT scan improved, smaller according to records  -Continue to monitor.  Palpitations  - Tachycardia was likely related to her dermatomyositis flare.  Overall she seems to be doing quite well.  No significant racing heart episodes.  Need to monitor.  Normal EF.  Medication Adjustments/Labs and Tests Ordered: Current medicines are reviewed at length with the patient today.  Concerns regarding medicines are outlined above.  Medication changes, Labs and Tests  ordered today are listed in the Patient Instructions below. Patient Instructions  Medication Instructions:  The current medical regimen is effective;  continue present plan and medications.  Follow-Up: Follow up in 1 year with Dr. Marlou Porch.  You  will receive a letter in the mail 2 months before you are due.  Please call us when you receive this letter to schedule your follow up appointment.  If you need a refill on your cardiac medications before your next appointment, please call your pharmacy.  Thank you for choosing [facility]!!        Signed, Candee Furbish, MD  09/23/2017 8:53 AM    [address], [place], [place]  [address] Phone: [phone]; Fax: [phone]

## 2017-09-23 ENCOUNTER — Ambulatory Visit (INDEPENDENT_AMBULATORY_CARE_PROVIDER_SITE_OTHER): Payer: Medicare Other | Admitting: Cardiology

## 2017-09-23 ENCOUNTER — Encounter: Payer: Self-pay | Admitting: Cardiology

## 2017-09-23 VITALS — BP 120/80 | HR 59 | Ht 66.0 in | Wt 168.0 lb

## 2017-09-23 DIAGNOSIS — R002 Palpitations: Secondary | ICD-10-CM

## 2017-09-23 DIAGNOSIS — M3503 Sicca syndrome with myopathy: Secondary | ICD-10-CM

## 2017-09-23 DIAGNOSIS — M3390 Dermatopolymyositis, unspecified, organ involvement unspecified: Secondary | ICD-10-CM | POA: Diagnosis not present

## 2017-09-23 NOTE — Patient Instructions (Signed)

## 2017-09-29 ENCOUNTER — Ambulatory Visit: Payer: Medicare Other | Attending: Rheumatology | Admitting: Physical Therapy

## 2017-09-29 ENCOUNTER — Encounter: Payer: Self-pay | Admitting: Physical Therapy

## 2017-09-29 DIAGNOSIS — R262 Difficulty in walking, not elsewhere classified: Secondary | ICD-10-CM | POA: Diagnosis not present

## 2017-09-29 DIAGNOSIS — M6281 Muscle weakness (generalized): Secondary | ICD-10-CM | POA: Diagnosis not present

## 2017-09-29 DIAGNOSIS — M79605 Pain in left leg: Secondary | ICD-10-CM | POA: Diagnosis not present

## 2017-09-29 DIAGNOSIS — M79604 Pain in right leg: Secondary | ICD-10-CM | POA: Insufficient documentation

## 2017-09-29 NOTE — Patient Instructions (Signed)
Access Code: [email or web address]  URL: [email or web address]  Date: 09/29/2017  Prepared by: Misty Stanley   Program Notes  Balance exercises: standing on one leg, tandem stand, feet together on foam - perform on aquatic days.   Non-aquatic days - Perform ball exercises, arm/neck exercises and step ups for leg strengthening   Exercises  Isometric Shoulder External Rotation at Wall - 5 reps - 1-2 sets - 5 second hold - 1x daily - 3x weekly  Standing Isometric Shoulder Internal Rotation at Doorway - 08-1008 reps - 1-2 sets - 5 hold - 1x daily - 3x weekly  Supine Cervical Retraction with Towel - 5-10 reps - 1-2 sets - 5 hold - 1x daily - 3x weekly  Lying Isometric cervical sidebending with manual resistance - 08-1008 reps - 1-2 sets - 5 hold - 1x daily - 3x weekly  Swiss Ball March - 10 reps - 1-2 sets - 1x daily - 3x weekly  Swiss Ball March and Kick - 10 reps - 1-2 sets - 1x daily - 3x weekly  Bridge with Heels on [employer] - 10 reps - 1 sets - 1x daily - 3x weekly  Supine Lower Trunk Rotation with Swiss Ball - 10 reps - 1 sets - 1x daily - 3x weekly  Arm Circles on [employer] - 10 reps - 1 sets - 1x daily - 3x weekly  Alternating Shoulder Flexion Seated on Swiss Ball - 10 reps - 1 sets - 1x daily - 3x weekly  Shoulder Horizontal Abduction with Resistance on Swiss Ball - 10 reps - 1 sets - 1x daily - 3x weekly  Row with Anchored Resistance on [employer] - 10 reps - 1 sets - 1x daily - 3x weekly  Seated Shoulder Extension and Scapular Retraction with Resistance on Swiss Ball - 10 reps - 1 sets - 1x daily - 3x weekly  Seated Diagonals With Medicine Ball on [employer] - 10 reps - 1 sets - 1x daily - 3x weekly  Braided Sidestepping - 10 reps - 4 sets - 1x daily - 3x weekly  Tandem Stance on Foam Pad with Eyes Closed - 20 reps - 4 sets - 1x daily - 5x weekly  Single Leg Stance on Foam Pad - 2 sets - 10 hold - 1x daily - 5x weekly  Standing with Feet Together on Foam Pad - 20 reps - 4 sets - 1x  daily - 5x weekly  Runner's Step up with Arms Forward - 10 reps - 1x daily - 3x weekly  Lateral Step Up with Counter Support - 10 reps - 1x daily - 3x weekly  Clamshell - 10 reps - 2 sets - 1x daily - 3x weekly  Bridge with Hip Abduction and Resistance - 10 reps - 2 sets - 1x daily - 3x weekly  Marching Bridge - 10 reps - 2 sets - 1x daily - 3x weekly

## 2017-09-29 NOTE — Therapy (Signed)
[facility] [address] [place], [place], [address] Phone: [phone]   Fax:  [phone]  Physical Therapy Treatment  Patient Details  Name: Marisa Orozco MRN: [id number] Date of Birth: 09-01-1950 Referring Provider: Bo Merino, MD (Rheumatology)   Encounter Date: 09/29/2017  PT End of Session - 09/29/17 1354    Visit Number  16    Number of Visits  21    Date for PT Re-Evaluation  10/30/17    Authorization Type  Medicare, [employer]HC    PT Start Time  [phone]    PT Stop Time  1020    PT Time Calculation (min)  39 min    Activity Tolerance  Patient tolerated treatment well    Behavior During Therapy  [facility] for tasks assessed/performed       Past Medical History:  Diagnosis Date  . Allergy   . Cancer ([place])    basal cell carcinoma - face  . 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 Former smoker 02/04/2016  . GERD (gastroesophageal reflux disease)    otc med prn  . PONV (postoperative nausea and vomiting)   . Sjogren's disease ([place]) 02/04/2016   Positive ANA, Positive Ro, Positive CCP, Negative RF, Parotitis, Positive Sicca  . Sjogren's syndrome ([place])    from MD note    Past Surgical History:  Procedure Laterality Date  . BREAST SURGERY     augmentation  . BUNIONECTOMY     right foot  . COLONOSCOPY    . DIAGNOSTIC LAPAROSCOPY    . DILATION AND CURETTAGE OF UTERUS  04/15/2011   Procedure: DILATATION AND CURETTAGE;  Surgeon: [name];  Location: [facility];  Service: Gynecology;  Laterality: N/A;  . MUSCLE BIOPSY Left 01/02/2016   Procedure: MUSCLE BIOPSY LEFT DELTOID;  Surgeon: [name], MD;  Location: [facility];  Service: General;  Laterality: Left;  left deltoid muscle bx  . NASAL SEPTUM SURGERY    . SVD     x 2  . TONSILLECTOMY    . WISDOM TOOTH EXTRACTION      There were no vitals filed for this visit.  Subjective  Assessment - 09/29/17 0949    Subjective  Pt feels stronger - does not have to pause as long to step off the curb, can get down into the tub and back up without difficulty.      Pertinent History  prolonged use of Prednisone - is weaning down; thymoma, Sjogren's disease, basal cell carcinoma - face, OA both hands    Limitations  Walking;House hold activities    Patient Stated Goals  to exercise safely    Currently in Pain?  No/denies         [facility] PT Assessment - 09/29/17 0953      ROM / Strength   AROM / PROM / Strength  Strength      Strength   Overall Strength  Deficits    Overall Strength Comments  hip flexor 4/5, hip ABD and extension in standing 5/5 - able to resist maximum resistance.  Hip IR 5/5 and ER: 4+/5      Transfers   Transfers  Floor to Transfer    Floor to Transfer  6: Modified independent (Device/Increase time)    Comments  when pushing up with RLE pt able to perform without touching furniture.  When pushing up with LLE pt requires light finger tip touch to  mat to stabilize.      Ambulation/Gait   Stairs  Yes    Stairs Assistance  7: Independent    Stair Management Technique  No rails;Alternating pattern;Forwards    Number of Stairs  12    Height of Stairs  6    Ramp  7: Independent    Curb  7: Independent                           PT Education - 09/29/17 1353    Education provided  Yes    Education Details  progress towards goals and areas to focus on for next 4 weeks, updated land HEP    Person(s) Educated  Patient    Methods  Explanation;Demonstration    Comprehension  Verbalized understanding;Returned demonstration      Access Code: [email or web address]  URL: [email or web address]  Date: 09/29/2017  Prepared by: Misty Stanley   Program Notes  Balance exercises: standing on one leg, tandem stand, feet together on foam - perform on aquatic days.   Non-aquatic days - Perform ball exercises, arm/neck exercises and step ups for  leg strengthening   Exercises added to HEP:  Removed supine clamshell and added: Clamshell - 10 reps - 2 sets - 1x daily - 3x weekly  Bridge with Hip Abduction and Resistance - 10 reps - 2 sets - 1x daily - 3x weekly  Marching Bridge - 10 reps - 2 sets - 1x daily - 3x weekly    PT Short Term Goals - 09/29/17 0951      PT SHORT TERM GOAL #1   Title  Pt will demonstrate independence with ongoing [employer] HEP ([email or web address])  (All STG due 09/30/17)    Baseline  independent with land based exercises, still awaiting aquatic HEP    Time  4    Period  Weeks    Status  Partially Met      PT SHORT TERM GOAL #2   Title  Patient will initiate community wellness program at [facility] or [facility] with pool available and initiate aquatic HEP.    Baseline  has not joined community wellness center yet    Time  4    Period  Weeks    Status  Not Met      PT SHORT TERM GOAL #3   Title  Pt will improve MMT of proximal hip mm to 5/5 bilaterally    Time  4    Period  Weeks    Status  Partially Met      PT SHORT TERM GOAL #4   Title  Pt will negotiate 8 stairs alternating sequence, MOD I without rails    Time  4    Period  Weeks    Status  Achieved        PT Long Term Goals - 09/29/17 1524      PT LONG TERM GOAL #1   Title  Pt will demonstrate independence with final gym/wellness HEP, walking program and aquatic exercise  (All LTG due 7/27)    Time  8    Period  Weeks    Status  On-going    Target Date  10/30/17      PT LONG TERM GOAL #2   Title  Pt will improve functional LE strength as indicated by decrease in five time sit to stand time to </= 10 seconds without UE support    Baseline  12 seconds    Time  8    Period  Weeks    Status  Revised    Target Date  10/30/17      PT LONG TERM GOAL #3   Title  Pt will report being able to stand from bath tub (without going to hands and knees) independently at home and will perform floor > stand without touching furniture MOD I    Time  8     Period  Weeks    Status  Revised    Target Date  10/30/17      PT LONG TERM GOAL #4   Title  Pt will negotiate 12 stairs, no rails MOD I    Time  8    Period  Weeks    Status  Achieved            Plan - 09/29/17 1515    Clinical Impression Statement  Patient returns for assessment of progress towards LTG.  Pt has met 1/4 LTG with ability to perform stairs without rails, alternating sequence.  Pt did not meet community wellness goal - she has investigated two places to do aquatic exercises at D/C but has not joined.  Pt has partially met HEP and MMT goals.  Pt is independent with land based HEP but has not received aquatic HEP yet and will require remainder of visits to learn aquatic HEP and try in a community setting.  Pt has improved in LE strength but continues to present with increased weakness in hip flexion and hip ER, L > R weakness creating increased instability during floor > stand.  Will continue to address to progress towards LTG and be independent with both land and aquatic based HEP in community.    Rehab Potential  Fair    Clinical Impairments Affecting Rehab Potential  active autoimmune disease    PT Frequency  2x / week    PT Duration  8 weeks PT re-assessment every 30 days    PT Treatment/Interventions  ADLs/Self Care Home Management;Aquatic Therapy;Moist Heat;Gait training;Stair training;Functional mobility training;Therapeutic activities;Therapeutic exercise;Patient/family education;Passive range of motion;Energy conservation;Neuromuscular re-education;Balance training    PT Next Visit Plan  Aquatic.  Pt needs to continue to work on and strengthen proximal hip- hip flexion, ER, IR, extension.  Recommended she take aquatic HEP to [facility] or [facility] and try all exercises alone and then discuss difficulties/questions with you to prepare for D/C.    Neuro: assess LTG on 7/24    PT Home Exercise Plan  walking program, aquatic therapy, gentle strengthening with isometric  exercises progressing to low weight/low resistance of proximal mm, balance    Consulted and Agree with Plan of Care  Patient       Patient will benefit from skilled therapeutic intervention in order to improve the following deficits and impairments:  Decreased strength, Difficulty walking, Impaired UE functional use, Pain, Decreased balance  Visit Diagnosis: Muscle weakness (generalized)  Difficulty in walking, not elsewhere classified  Pain in right leg  Pain in left leg     Problem List Patient Active Problem List   Diagnosis Date Noted  . Primary osteoarthritis of both hands 09/08/2017  . High risk medication use 04/07/2016  . Sjogren's disease ([place]) 02/04/2016  . 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 Former smoker 02/04/2016  . 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 Fatigue 02/15/2012    Rico Junker, PT, DPT 09/29/17    3:31 PM    [facility] [address] [place], [place], [address]  Phone: [phone]   Fax:  [phone]  Name: Marisa Orozco MRN: [id number] Date of Birth: [birth year]

## 2017-09-30 ENCOUNTER — Ambulatory Visit: Payer: Medicare Other | Admitting: Rheumatology

## 2017-10-03 ENCOUNTER — Encounter: Payer: Self-pay | Admitting: Rheumatology

## 2017-10-04 ENCOUNTER — Other Ambulatory Visit: Payer: Self-pay | Admitting: *Deleted

## 2017-10-04 DIAGNOSIS — Z79899 Other long term (current) drug therapy: Secondary | ICD-10-CM

## 2017-10-05 ENCOUNTER — Ambulatory Visit: Payer: Medicare Other

## 2017-10-05 DIAGNOSIS — J9859 Other diseases of mediastinum, not elsewhere classified: Secondary | ICD-10-CM | POA: Diagnosis not present

## 2017-10-05 DIAGNOSIS — Z79899 Other long term (current) drug therapy: Secondary | ICD-10-CM | POA: Diagnosis not present

## 2017-10-06 LAB — CBC WITH DIFFERENTIAL/PLATELET
BASOS ABS: 42 {cells}/uL (ref 0–200)
Basophils Relative: 0.4 %
EOS PCT: 0.6 %
Eosinophils Absolute: 62 cells/uL (ref 15–500)
HEMATOCRIT: 44.5 % (ref 35.0–45.0)
Hemoglobin: 14.9 g/dL (ref 11.7–15.5)
LYMPHS ABS: 3172 {cells}/uL (ref 850–3900)
MCH: 29.2 pg (ref 27.0–33.0)
MCHC: 33.5 g/dL (ref 32.0–36.0)
MCV: 87.1 fL (ref 80.0–100.0)
MPV: 10.9 fL (ref 7.5–12.5)
Monocytes Relative: 7.2 %
NEUTROS PCT: 61.3 %
Neutro Abs: 6375 cells/uL (ref 1500–7800)
Platelets: 207 10*3/uL (ref 140–400)
RBC: 5.11 10*6/uL — ABNORMAL HIGH (ref 3.80–5.10)
RDW: 16.9 % — ABNORMAL HIGH (ref 11.0–15.0)
TOTAL LYMPHOCYTE: 30.5 %
WBC mixed population: 749 cells/uL (ref 200–950)
WBC: 10.4 10*3/uL (ref 3.8–10.8)

## 2017-10-06 LAB — COMPLETE METABOLIC PANEL WITH GFR
AG RATIO: 1.8 (calc) (ref 1.0–2.5)
ALKALINE PHOSPHATASE (APISO): 50 U/L (ref 33–130)
ALT: 24 U/L (ref 6–29)
AST: 20 U/L (ref 10–35)
Albumin: 4.3 g/dL (ref 3.6–5.1)
BILIRUBIN TOTAL: 0.7 mg/dL (ref 0.2–1.2)
BUN: 20 mg/dL (ref 7–25)
CO2: 29 mmol/L (ref 20–32)
Calcium: 9.8 mg/dL (ref 8.6–10.4)
Chloride: 107 mmol/L (ref 98–110)
Creat: 0.82 mg/dL (ref 0.50–0.99)
GFR, Est African American: 86 mL/min/{1.73_m2} (ref >=60)
GFR, Est Non African American: 75 mL/min/{1.73_m2} (ref >=60)
GLUCOSE: 89 mg/dL (ref 65–99)
Globulin: 2.4 g/dL (calc) (ref 1.9–3.7)
POTASSIUM: 4 mmol/L (ref 3.5–5.3)
Sodium: 145 mmol/L (ref 135–146)
TOTAL PROTEIN: 6.7 g/dL (ref 6.1–8.1)

## 2017-10-06 LAB — CK: CK TOTAL: 16 U/L — AB (ref 29–143)

## 2017-10-08 ENCOUNTER — Telehealth: Payer: Self-pay | Admitting: Rheumatology

## 2017-10-08 NOTE — Telephone Encounter (Signed)
Left message to advise patient of lab results.  

## 2017-10-08 NOTE — Telephone Encounter (Signed)
Patient left a voicemail stating she was returning your call.   

## 2017-10-12 ENCOUNTER — Ambulatory Visit: Payer: Medicare Other

## 2017-10-14 ENCOUNTER — Ambulatory Visit: Payer: Medicare Other | Attending: Rheumatology

## 2017-10-14 ENCOUNTER — Other Ambulatory Visit: Payer: Self-pay

## 2017-10-14 DIAGNOSIS — R262 Difficulty in walking, not elsewhere classified: Secondary | ICD-10-CM | POA: Diagnosis not present

## 2017-10-14 DIAGNOSIS — M79605 Pain in left leg: Secondary | ICD-10-CM

## 2017-10-14 DIAGNOSIS — M79604 Pain in right leg: Secondary | ICD-10-CM | POA: Diagnosis not present

## 2017-10-14 DIAGNOSIS — M6281 Muscle weakness (generalized): Secondary | ICD-10-CM | POA: Diagnosis not present

## 2017-10-14 NOTE — Therapy (Signed)
[place] MAIN [facility] SERVICES [address] [place] [address], [place], [address] Phone: [phone]   Fax:  [phone]  Physical Therapy Treatment  Patient Details  Name: Marisa Orozco MRN: [id number] Date of Birth: 1950-06-08 Referring Provider: Bo Merino, MD (Rheumatology)   Encounter Date: 10/14/2017  PT End of Session - 10/14/17 1733    Visit Number  17    Number of Visits  21    Date for PT Re-Evaluation  10/30/17    Authorization Type  Medicare, UHC    PT Start Time  1200    PT Stop Time  1310    PT Time Calculation (min)  70 min    Activity Tolerance  Patient tolerated treatment well    Behavior During Therapy  [facility] for tasks assessed/performed       Past Medical History:  Diagnosis Date  . Allergy   . Cancer ([place])    basal cell carcinoma - face  . 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 Former smoker 02/04/2016  . GERD (gastroesophageal reflux disease)    otc med prn  . PONV (postoperative nausea and vomiting)   . Sjogren's disease ([place]) 02/04/2016   Positive ANA, Positive Ro, Positive CCP, Negative RF, Parotitis, Positive Sicca  . Sjogren's syndrome ([place])    from MD note    Past Surgical History:  Procedure Laterality Date  . BREAST SURGERY     augmentation  . BUNIONECTOMY     right foot  . COLONOSCOPY    . DIAGNOSTIC LAPAROSCOPY    . DILATION AND CURETTAGE OF UTERUS  04/15/2011   Procedure: DILATATION AND CURETTAGE;  Surgeon: [name];  Location: [facility];  Service: Gynecology;  Laterality: N/A;  . MUSCLE BIOPSY Left 01/02/2016   Procedure: MUSCLE BIOPSY LEFT DELTOID;  Surgeon: [name], MD;  Location: [facility];  Service: General;  Laterality: Left;  left deltoid muscle bx  . NASAL SEPTUM SURGERY    . SVD     x 2  . TONSILLECTOMY    . WISDOM TOOTH EXTRACTION      There were no vitals filed for this visit.  Subjective Assessment -  10/14/17 1731    Subjective  Pt reports tolerating aquatic sessions well. Pt notes continued weaning of steroid use. No significant pain/issues this day.     Pertinent History  prolonged use of Prednisone - is weaning down; thymoma, Sjogren's disease, basal cell carcinoma - face, OA both hands      Enters/exits via ramp  Ambulation  Fwd 4 L  Side with squat 4 L  Plank position at step, 2#, B 3 x 10 ea  Hip ext  Hip abd  Ham curl  Suspended work, 5 min (75 sec ea)  Jog  Jack   Ski  X jack  2 L active recovery walk with arm drag (chest opener)  Core strengthening, 30x each, 2 green dumbbells  Fwd crunch  B lateral crunch  Suspend work as above  2 L active recovery with arm drag  LE strength, 2 green dumbbells, 25x ea  Squat  Fwd lunges, B  Side lunges, B  Suspended work as above, 5 min  1 L active recovery posterior with fwd arm drag  Active stretching, 3x ea  Hamstrings  Hip flexor/quad  LB/lateral flank  PT Education - 10/14/17 1732    Education provided  Yes    Education Details  working through [employer] with warm up, strength and cardio alternating sets and cool down. Education on progression/regression of programs depending on current symptoms at that time.        PT Short Term Goals - 09/29/17 0951      PT SHORT TERM GOAL #1   Title  Pt will demonstrate independence with ongoing [employer] HEP ([id number])  (All STG due 09/30/17)    Baseline  independent with land based exercises, still awaiting aquatic HEP    Time  4    Period  Weeks    Status  Partially Met      PT SHORT TERM GOAL #2   Title  Patient will initiate community wellness program at [employer] or [facility] with pool available and initiate aquatic HEP.    Baseline  has not joined community wellness center yet    Time  4    Period  Weeks    Status  Not Met      PT SHORT TERM GOAL #3   Title  Pt will improve MMT of proximal hip mm to 5/5  bilaterally    Time  4    Period  Weeks    Status  Partially Met      PT SHORT TERM GOAL #4   Title  Pt will negotiate 8 stairs alternating sequence, MOD I without rails    Time  4    Period  Weeks    Status  Achieved        PT Long Term Goals - 09/29/17 1524      PT LONG TERM GOAL #1   Title  Pt will demonstrate independence with final gym/wellness HEP, walking program and aquatic exercise  (All LTG due 7/27)    Time  8    Period  Weeks    Status  On-going    Target Date  10/30/17      PT LONG TERM GOAL #2   Title  Pt will improve functional LE strength as indicated by decrease in five time sit to stand time to </= 10 seconds without UE support    Baseline  12 seconds    Time  8    Period  Weeks    Status  Revised    Target Date  10/30/17      PT LONG TERM GOAL #3   Title  Pt will report being able to stand from bath tub (without going to hands and knees) independently at home and will perform floor > stand without touching furniture MOD I    Time  8    Period  Weeks    Status  Revised    Target Date  10/30/17      PT LONG TERM GOAL #4   Title  Pt will negotiate 12 stairs, no rails MOD I    Time  8    Period  Weeks    Status  Achieved            Plan - 10/14/17 1734    Clinical Impression Statement  Pt tolerated session well; some increased time during strengthening sets for learning (pt feeling in proper areas with proper form). Plan to have 1 written program for pt next visit with alternative strength sets to allow for variation/change in program. Continue to allow for proficienc with program and greater independence.     Rehab Potential  Fair    Clinical Impairments Affecting Rehab Potential  active autoimmune disease    PT Frequency  2x / week    PT Duration  8 weeks PT re-assessment every 30 days    PT Treatment/Interventions  ADLs/Self Care Home Management;Aquatic Therapy;Moist Heat;Gait training;Stair training;Functional mobility training;Therapeutic  activities;Therapeutic exercise;Patient/family education;Passive range of motion;Energy conservation;Neuromuscular re-education;Balance training    PT Next Visit Plan  Aquatic.  Pt needs to continue to work on and strengthen proximal hip- hip flexion, ER, IR, extension.  Recommended she take aquatic HEP to [facility] or [facility] and try all exercises alone and then discuss difficulties/questions with you to prepare for D/C.    Neuro: assess LTG on 7/24    PT Home Exercise Plan  walking program, aquatic therapy, gentle strengthening with isometric exercises progressing to low weight/low resistance of proximal mm, balance    Consulted and Agree with Plan of Care  Patient       Patient will benefit from skilled therapeutic intervention in order to improve the following deficits and impairments:  Decreased strength, Difficulty walking, Impaired UE functional use, Pain, Decreased balance  Visit Diagnosis: Muscle weakness (generalized)  Difficulty in walking, not elsewhere classified  Pain in right leg  Pain in left leg     Problem List Patient Active Problem List   Diagnosis Date Noted  . Primary osteoarthritis of both hands 09/08/2017  . High risk medication use 04/07/2016  . Sjogren's disease ([place]) 02/04/2016  . 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 Former smoker 02/04/2016  . 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 Fatigue 02/15/2012    Larae Grooms 10/14/2017, 5:36 PM  [place] MAIN [facility] SERVICES [address] [place], [place], [address] Phone: [phone]   Fax:  [phone]  Name: Marisa Orozco MRN: [id number] Date of Birth: 10-22-1950

## 2017-10-15 DIAGNOSIS — M3390 Dermatopolymyositis, unspecified, organ involvement unspecified: Secondary | ICD-10-CM | POA: Diagnosis not present

## 2017-10-15 DIAGNOSIS — Z85828 Personal history of other malignant neoplasm of skin: Secondary | ICD-10-CM | POA: Diagnosis not present

## 2017-10-16 ENCOUNTER — Encounter: Payer: Self-pay | Admitting: Rheumatology

## 2017-10-18 ENCOUNTER — Encounter: Payer: Self-pay | Admitting: Rheumatology

## 2017-10-18 ENCOUNTER — Other Ambulatory Visit: Payer: Self-pay | Admitting: *Deleted

## 2017-10-18 MED ORDER — METHOTREXATE SODIUM CHEMO INJECTION 50 MG/2ML: 20.0000 mg | INTRAMUSCULAR | 0 refills | Status: DC

## 2017-10-18 MED ORDER — PREDNISONE 5 MG PO TABS: ORAL_TABLET | ORAL | 0 refills | Status: DC

## 2017-10-18 NOTE — Telephone Encounter (Signed)
Last Visit: 09/08/17 Next Visit: 12/09/17 Labs: 10/05/17 CMP WNL. CBC stable. CK is 16.   Okay to refill per Dr. Estanislado Pandy

## 2017-10-18 NOTE — Telephone Encounter (Signed)
She can taper prednisone by 2.5 mg every 2 weeks until she reaches 10 mg of prednisone and then stay on 10 mg a day.

## 2017-10-19 ENCOUNTER — Ambulatory Visit: Payer: Medicare Other

## 2017-10-19 ENCOUNTER — Other Ambulatory Visit: Payer: Self-pay

## 2017-10-19 DIAGNOSIS — M79604 Pain in right leg: Secondary | ICD-10-CM

## 2017-10-19 DIAGNOSIS — M6281 Muscle weakness (generalized): Secondary | ICD-10-CM | POA: Diagnosis not present

## 2017-10-19 DIAGNOSIS — M79605 Pain in left leg: Secondary | ICD-10-CM

## 2017-10-19 DIAGNOSIS — R262 Difficulty in walking, not elsewhere classified: Secondary | ICD-10-CM

## 2017-10-19 NOTE — Therapy (Signed)
[place] MAIN [facility] SERVICES [address] [place], [place], [address] Phone: [phone]   Fax:  [phone]  Physical Therapy Treatment  Patient Details  Name: Marisa Orozco MRN: [id number] Date of Birth: June 16, 1950 Referring Provider: Bo Merino, MD (Rheumatology)   Encounter Date: 10/19/2017  PT End of Session - 10/19/17 1555    Visit Number  18    Number of Visits  21    Date for PT Re-Evaluation  10/30/17    Authorization Type  Medicare, UHC    PT Start Time  1210    PT Stop Time  1300    PT Time Calculation (min)  50 min       Past Medical History:  Diagnosis Date  . Allergy   . Cancer ([place])    basal cell carcinoma - face  . 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 Former smoker 02/04/2016  . GERD (gastroesophageal reflux disease)    otc med prn  . PONV (postoperative nausea and vomiting)   . Sjogren's disease ([place]) 02/04/2016   Positive ANA, Positive Ro, Positive CCP, Negative RF, Parotitis, Positive Sicca  . Sjogren's syndrome ([place])    from MD note    Past Surgical History:  Procedure Laterality Date  . BREAST SURGERY     augmentation  . BUNIONECTOMY     right foot  . COLONOSCOPY    . DIAGNOSTIC LAPAROSCOPY    . DILATION AND CURETTAGE OF UTERUS  04/15/2011   Procedure: DILATATION AND CURETTAGE;  Surgeon: [name];  Location: [facility];  Service: Gynecology;  Laterality: N/A;  . MUSCLE BIOPSY Left 01/02/2016   Procedure: MUSCLE BIOPSY LEFT DELTOID;  Surgeon: [name], MD;  Location: [place];  Service: General;  Laterality: Left;  left deltoid muscle bx  . NASAL SEPTUM SURGERY    . SVD     x 2  . TONSILLECTOMY    . WISDOM TOOTH EXTRACTION      There were no vitals filed for this visit.  Subjective Assessment - 10/19/17 1552    Subjective  Pt denies any problems post last session. Feels well today except for headache.        Walking warm up (done independently) x 10 min  Fwd, 4L  Sidestep with squat, 4L  Fwd lunge, 4L  Glute strength, 20x ea; all R then all L  Hip abd  Hip 45 deg angle back  Hip ext  Cardio interval, suspended position, 1 min each (total 19mn)  Jog  [employer]  X jacks  Moguls  2 L active recovery walk with arm drag/chest opener  Lower extremity strength, green dumbbells, 20x ea  Squats  Side lunges  Fwd lunges  Cardio interval as above followed by active recovery lap  Core with upper extremity work  Lunge position, alternated each exercise, 10x each   Triceps press downs   Sh ab/add   Sh flex/ext   Sh horiz ab/add  Cardio interval as above followed by active recovery lap  Active recovery stretching  hamstrings  hip flexor/quads                          PT Education - 10/19/17 1554    Education provided  Yes    Education Details  Worked on aquatic HEP. Addition of moguls in cardio intervals    Person(s) Educated  Patient    Methods  Explanation;Demonstration    Comprehension  Verbalized understanding;Returned demonstration;Verbal cues required       PT Short Term Goals - 09/29/17 0951      PT SHORT TERM GOAL #1   Title  Pt will demonstrate independence with ongoing [employer] HEP (QW6FDVCE)  (All STG due 09/30/17)    Baseline  independent with land based exercises, still awaiting aquatic HEP    Time  4    Period  Weeks    Status  Partially Met      PT SHORT TERM GOAL #2   Title  Patient will initiate community wellness program at [facility] or [facility] with pool available and initiate aquatic HEP.    Baseline  has not joined community wellness center yet    Time  4    Period  Weeks    Status  Not Met      PT SHORT TERM GOAL #3   Title  Pt will improve MMT of proximal hip mm to 5/5 bilaterally    Time  4    Period  Weeks    Status  Partially Met      PT SHORT TERM GOAL #4   Title  Pt will negotiate 8 stairs alternating  sequence, MOD I without rails    Time  4    Period  Weeks    Status  Achieved        PT Long Term Goals - 09/29/17 1524      PT LONG TERM GOAL #1   Title  Pt will demonstrate independence with final gym/wellness HEP, walking program and aquatic exercise  (All LTG due 7/27)    Time  8    Period  Weeks    Status  On-going    Target Date  10/30/17      PT LONG TERM GOAL #2   Title  Pt will improve functional LE strength as indicated by decrease in five time sit to stand time to </= 10 seconds without UE support    Baseline  12 seconds    Time  8    Period  Weeks    Status  Revised    Target Date  10/30/17      PT LONG TERM GOAL #3   Title  Pt will report being able to stand from bath tub (without going to hands and knees) independently at home and will perform floor > stand without touching furniture MOD I    Time  8    Period  Weeks    Status  Revised    Target Date  10/30/17      PT LONG TERM GOAL #4   Title  Pt will negotiate 12 stairs, no rails MOD I    Time  8    Period  Weeks    Status  Achieved            Plan - 10/19/17 1556    Clinical Impression Statement  Pt tolerating session well. Learning HEP for eventual independence with program. Requires verbal cues at this time.     Rehab Potential  Fair    Clinical Impairments Affecting Rehab Potential  active autoimmune disease    PT Frequency  2x / week    PT Duration  8 weeks PT re-assessment every 30 days    PT Treatment/Interventions  ADLs/Self Care Home Management;Aquatic Therapy;Moist Heat;Gait training;Stair training;Functional mobility training;Therapeutic activities;Therapeutic exercise;Patient/family education;Passive range of motion;Energy conservation;Neuromuscular re-education;Balance training  PT Next Visit Plan  Aquatic.  Pt needs to continue to work on and strengthen proximal hip- hip flexion, ER, IR, extension.  Recommended she take aquatic HEP to [facility] or [facility] [place] [facility] and try all exercises  alone and then discuss difficulties/questions with you to prepare for D/C.    Neuro: assess LTG on 7/24    PT Home Exercise Plan  walking program, aquatic therapy, gentle strengthening with isometric exercises progressing to low weight/low resistance of proximal mm, balance    Consulted and Agree with Plan of Care  Patient       Patient will benefit from skilled therapeutic intervention in order to improve the following deficits and impairments:  Decreased strength, Difficulty walking, Impaired UE functional use, Pain, Decreased balance  Visit Diagnosis: Muscle weakness (generalized)  Difficulty in walking, not elsewhere classified  Pain in right leg  Pain in left leg     Problem List Patient Active Problem List   Diagnosis Date Noted  . Primary osteoarthritis of both hands 09/08/2017  . High risk medication use 04/07/2016  . Sjogren's disease ([place]) 02/04/2016  . 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 Former smoker 02/04/2016  . 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 Fatigue 02/15/2012    Larae Grooms 10/19/2017, 3:59 PM  [place] MAIN [facility] SERVICES [address] [place], [place], [address] Phone: [phone]   Fax:  [phone]  Name: CECILE GILLISPIE MRN: [id number] Date of Birth: [birth year]

## 2017-10-25 ENCOUNTER — Telehealth: Payer: Self-pay | Admitting: Physician Assistant

## 2017-10-25 NOTE — Telephone Encounter (Signed)
Patient called stating she was returning your call.   °

## 2017-10-26 ENCOUNTER — Telehealth: Payer: Self-pay | Admitting: Rheumatology

## 2017-10-26 NOTE — Telephone Encounter (Signed)
Patient left a voicemail stating that she was sorry she missed your call.

## 2017-10-26 NOTE — Telephone Encounter (Signed)
Attempted to contact the patient and left message for patient to call the office.  

## 2017-10-27 ENCOUNTER — Encounter: Payer: Self-pay | Admitting: Physical Therapy

## 2017-10-27 ENCOUNTER — Ambulatory Visit: Payer: Medicare Other | Attending: Rheumatology | Admitting: Physical Therapy

## 2017-10-27 DIAGNOSIS — M6281 Muscle weakness (generalized): Secondary | ICD-10-CM

## 2017-10-27 DIAGNOSIS — R262 Difficulty in walking, not elsewhere classified: Secondary | ICD-10-CM | POA: Diagnosis not present

## 2017-10-27 DIAGNOSIS — M79604 Pain in right leg: Secondary | ICD-10-CM | POA: Diagnosis not present

## 2017-10-27 DIAGNOSIS — M79605 Pain in left leg: Secondary | ICD-10-CM | POA: Diagnosis not present

## 2017-10-27 MED ORDER — METHOTREXATE SODIUM CHEMO INJECTION 50 MG/2ML: 20.0000 mg | INTRAMUSCULAR | 2 refills | Status: DC

## 2017-10-27 NOTE — Therapy (Signed)
[facility] [address] [place], [place], [address] Phone: [phone]   Fax:  [phone]  Physical Therapy Treatment  Patient Details  Name: Marisa Orozco MRN: [id number] Date of Birth: [birth year] Referring Provider: Bo Merino, MD (Rheumatology)   Encounter Date: 10/27/2017  PT End of Session - 10/27/17 2045    Visit Number  19    Number of Visits  23    Date for PT Re-Evaluation  11/29/17    Authorization Type  Medicare, UHC    PT Start Time  1022    PT Stop Time  1100    PT Time Calculation (min)  38 min    Activity Tolerance  Patient tolerated treatment well    Behavior During Therapy  [facility] for tasks assessed/performed       Past Medical History:  Diagnosis Date  . Allergy   . Cancer ([place])    basal cell carcinoma - face  . 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 Former smoker 02/04/2016  . GERD (gastroesophageal reflux disease)    otc med prn  . PONV (postoperative nausea and vomiting)   . Sjogren's disease ([place]) 02/04/2016   Positive ANA, Positive Ro, Positive CCP, Negative RF, Parotitis, Positive Sicca  . Sjogren's syndrome ([place])    from MD note    Past Surgical History:  Procedure Laterality Date  . BREAST SURGERY     augmentation  . BUNIONECTOMY     right foot  . COLONOSCOPY    . DIAGNOSTIC LAPAROSCOPY    . DILATION AND CURETTAGE OF UTERUS  04/15/2011   Procedure: DILATATION AND CURETTAGE;  Surgeon: [name];  Location: [facility];  Service: Gynecology;  Laterality: N/A;  . MUSCLE BIOPSY Left 01/02/2016   Procedure: MUSCLE BIOPSY LEFT DELTOID;  Surgeon: [name], MD;  Location: [facility];  Service: General;  Laterality: Left;  left deltoid muscle bx  . NASAL SEPTUM SURGERY    . SVD     x 2  . TONSILLECTOMY    . WISDOM TOOTH EXTRACTION      There were no vitals filed for this visit.  Subjective  Assessment - 10/27/17 1028    Subjective  Continues to go down on prednisone without any significant side effects.  Visited two pilates classes and plans to go to the [employer].  Has joined [employer] for aquatic exercises and arthritis class.      Pertinent History  prolonged use of Prednisone - is weaning down; thymoma, Sjogren's disease, basal cell carcinoma - face, OA both hands    Limitations  Walking;House hold activities    Patient Stated Goals  to exercise safely    Currently in Pain?  No/denies         [facility] PT Assessment - 10/27/17 1040      Standardized Balance Assessment   Standardized Balance Assessment  Five Times Sit to Stand    Five times sit to stand comments   9.06 seconds without UE support                   [facility] Adult PT Treatment/Exercise - 10/27/17 1040      Transfers   Transfers  Floor to Transfer    Floor to Transfer  6: Modified independent (Device/Increase time)    Comments  MOD I without UE support standing from RLE; LLE still requires UE support  Ambulation/Gait   Stairs  Yes    Stairs Assistance  7: Independent    Stair Management Technique  No rails;Alternating pattern;Forwards    Number of Stairs  16    Height of Stairs  6      Updated and finalized land based HEP as below; removed forwards and lateral step ups and added side stepping on stairs and pulsed lunges  Access Code: [email or web address]  URL: [email or web address]  Date: 10/27/2017  Prepared by: Misty Stanley   Program Notes  Balance exercises: standing on one leg, tandem stand, feet together on foam - perform on aquatic days.   Non-aquatic days - Perform ball exercises, arm/neck exercises, leg exercises   Exercises  Isometric Shoulder External Rotation at Wall - 5 reps - 1-2 sets - 5 second hold - 1x daily - 3x weekly  Standing Isometric Shoulder Internal Rotation at Doorway - 08-1008 reps - 1-2 sets - 5 hold - 1x daily - 3x weekly  Supine  Cervical Retraction with Towel - 5-10 reps - 1-2 sets - 5 hold - 1x daily - 3x weekly  Lying Isometric cervical sidebending with manual resistance - 08-1008 reps - 1-2 sets - 5 hold - 1x daily - 3x weekly  Swiss Ball March - 10 reps - 1-2 sets - 1x daily - 3x weekly  Swiss Ball March and Kick - 10 reps - 1-2 sets - 1x daily - 3x weekly  Bridge with Heels on [employer] - 10 reps - 1 sets - 1x daily - 3x weekly  Supine Lower Trunk Rotation with Swiss Ball - 10 reps - 1 sets - 1x daily - 3x weekly  Arm Circles on [employer] - 10 reps - 1 sets - 1x daily - 3x weekly  Alternating Shoulder Flexion Seated on Swiss Ball - 10 reps - 1 sets - 1x daily - 3x weekly  Shoulder Horizontal Abduction with Resistance on Swiss Ball - 10 reps - 1 sets - 1x daily - 3x weekly  Row with Anchored Resistance on [employer] - 10 reps - 1 sets - 1x daily - 3x weekly  Seated Shoulder Extension and Scapular Retraction with Resistance on Swiss Ball - 10 reps - 1 sets - 1x daily - 3x weekly  Seated Diagonals With Medicine Ball on [employer] - 10 reps - 1 sets - 1x daily - 3x weekly  Braided Sidestepping - 10 reps - 4 sets - 1x daily - 3x weekly  Tandem Stance on Foam Pad with Eyes Closed - 20 reps - 4 sets - 1x daily - 5x weekly  Single Leg Stance on Foam Pad - 2 sets - 10 hold - 1x daily - 5x weekly  Standing with Feet Together on Foam Pad - 20 reps - 4 sets - 1x daily - 5x weekly  Clamshell - 10 reps - 2 sets - 1x daily - 3x weekly  Bridge with Hip Abduction and Resistance - 10 reps - 2 sets - 1x daily - 3x weekly  Marching Bridge - 10 reps - 2 sets - 1x daily - 3x weekly  Walk up Stairs with Assist - 4 sets - 1x daily - 3x weekly  Pulse Lunge - 5 reps - 2 sets - 1x daily - 3x weekly        PT Education - 10/27/17 2044    Education provided  Yes    Education Details  progress towards goals; plan to recertify for 2-3 more visits to finalize aquatic HEP.  Land based HEP updated    Person(s) Educated  Patient     Methods  Explanation    Comprehension  Verbalized understanding       PT Short Term Goals - 09/29/17 0951      PT SHORT TERM GOAL #1   Title  Pt will demonstrate independence with ongoing MedBridge HEP ([email or web address])  (All STG due 09/30/17)    Baseline  independent with land based exercises, still awaiting aquatic HEP    Time  4    Period  Weeks    Status  Partially Met      PT SHORT TERM GOAL #2   Title  Patient will initiate community wellness program at [facility] or [facility] with pool available and initiate aquatic HEP.    Baseline  has not joined community wellness center yet    Time  4    Period  Weeks    Status  Not Met      PT SHORT TERM GOAL #3   Title  Pt will improve MMT of proximal hip mm to 5/5 bilaterally    Time  4    Period  Weeks    Status  Partially Met      PT SHORT TERM GOAL #4   Title  Pt will negotiate 8 stairs alternating sequence, MOD I without rails    Time  4    Period  Weeks    Status  Achieved        PT Long Term Goals - 10/27/17 1030      PT LONG TERM GOAL #1   Title  Pt will demonstrate independence with final gym/wellness HEP, walking program and aquatic exercise  (All LTG due 7/27)    Time  8    Period  Weeks    Status  On-going      PT LONG TERM GOAL #2   Title  Pt will improve functional LE strength as indicated by decrease in five time sit to stand time to </= 10 seconds without UE support    Baseline  9 seconds in chair without UE support    Time  8    Period  Weeks    Status  Achieved      PT LONG TERM GOAL #3   Title  Pt will report being able to stand from bath tub (without going to hands and knees) independently at home and will perform floor > stand without touching furniture MOD I    Time  8    Period  Weeks    Status  Partially Met      PT LONG TERM GOAL #4   Title  Pt will negotiate 12 stairs, no rails MOD I    Time  8    Period  Weeks    Status  Achieved            Plan - 10/27/17 2047    Clinical Impression  Statement  Performed assessment of progress towards LTG.  Pt has made good progress and has met 2/4 LTG, partially met floor transfer goal and HEP goal is ongoing.  Pt demonstrates improvement in balance and LE strength as evidenced by decrease in time to perform 5 time sit to stand, ability to stand from the floor with RLE without use of UE for support and ability to negotiate 16 stairs, alternating sequence without use of UE.  Pt continues to present with LLE weakness and instability and will benefit from 2-3 more visits  to finalize aquatic exercises for community wellness and HEP to maintain gains; land based HEP finalized today.      Rehab Potential  Fair    Clinical Impairments Affecting Rehab Potential  active autoimmune disease    PT Frequency  1x / week    PT Duration  4 weeks PT re-assessment every 30 days    PT Treatment/Interventions  ADLs/Self Care Home Management;Aquatic Therapy;Moist Heat;Gait training;Stair training;Functional mobility training;Therapeutic activities;Therapeutic exercise;Patient/family education;Passive range of motion;Energy conservation;Neuromuscular re-education;Balance training    PT Next Visit Plan  Finalize aquatic HEP; once completed route to me for D/C.  Thanks!    PT Home Exercise Plan  MedBridge: [email or web address]    Consulted and Agree with Plan of Care  Patient       Patient will benefit from skilled therapeutic intervention in order to improve the following deficits and impairments:  Decreased strength, Difficulty walking, Pain, Decreased balance, Impaired UE functional use  Visit Diagnosis: Muscle weakness (generalized)  Difficulty in walking, not elsewhere classified  Pain in right leg  Pain in left leg     Problem List Patient Active Problem List   Diagnosis Date Noted  . Primary osteoarthritis of both hands 09/08/2017  . High risk medication use 04/07/2016  . Sjogren's disease ([place]) 02/04/2016  . 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 Former  smoker 02/04/2016  . 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 Fatigue 02/15/2012    Rico Junker, PT, DPT 10/27/17    9:05 PM    [facility] [address] [place], [place], [address] Phone: [phone]   Fax:  [phone]  Name: AUTYMN OMLOR MRN: [id number] Date of Birth: 1950-05-11

## 2017-10-27 NOTE — Patient Instructions (Addendum)
Access Code: [email or web address]  URL: [email or web address]  Date: 10/27/2017  Prepared by: Misty Stanley   Program Notes  Balance exercises: standing on one leg, tandem stand, feet together on foam - perform on aquatic days.   Non-aquatic days - Perform ball exercises, arm/neck exercises and step ups for leg strengthening   Exercises  Isometric Shoulder External Rotation at Wall - 5 reps - 1-2 sets - 5 second hold - 1x daily - 3x weekly  Standing Isometric Shoulder Internal Rotation at Doorway - 08-1008 reps - 1-2 sets - 5 hold - 1x daily - 3x weekly  Supine Cervical Retraction with Towel - 5-10 reps - 1-2 sets - 5 hold - 1x daily - 3x weekly  Lying Isometric cervical sidebending with manual resistance - 08-1008 reps - 1-2 sets - 5 hold - 1x daily - 3x weekly  Swiss Ball March - 10 reps - 1-2 sets - 1x daily - 3x weekly  Swiss Ball March and Kick - 10 reps - 1-2 sets - 1x daily - 3x weekly  Bridge with Heels on [employer] - 10 reps - 1 sets - 1x daily - 3x weekly  Supine Lower Trunk Rotation with Swiss Ball - 10 reps - 1 sets - 1x daily - 3x weekly  Arm Circles on [employer] - 10 reps - 1 sets - 1x daily - 3x weekly  Alternating Shoulder Flexion Seated on Swiss Ball - 10 reps - 1 sets - 1x daily - 3x weekly  Shoulder Horizontal Abduction with Resistance on Swiss Ball - 10 reps - 1 sets - 1x daily - 3x weekly  Row with Anchored Resistance on [employer] - 10 reps - 1 sets - 1x daily - 3x weekly  Seated Shoulder Extension and Scapular Retraction with Resistance on Swiss Ball - 10 reps - 1 sets - 1x daily - 3x weekly  Seated Diagonals With Medicine Ball on [employer] - 10 reps - 1 sets - 1x daily - 3x weekly  Braided Sidestepping - 10 reps - 4 sets - 1x daily - 3x weekly  Tandem Stance on Foam Pad with Eyes Closed - 20 reps - 4 sets - 1x daily - 5x weekly  Single Leg Stance on Foam Pad - 2 sets - 10 hold - 1x daily - 5x weekly  Standing with Feet Together on Foam Pad - 20 reps - 4 sets - 1x  daily - 5x weekly  Clamshell - 10 reps - 2 sets - 1x daily - 3x weekly  Bridge with Hip Abduction and Resistance - 10 reps - 2 sets - 1x daily - 3x weekly  Marching Bridge - 10 reps - 2 sets - 1x daily - 3x weekly  Walk up Stairs with Assist - 4 sets - 1x daily - 3x weekly  Pulse Lunge - 5 reps - 2 sets - 1x daily - 3x weekly

## 2017-10-27 NOTE — Telephone Encounter (Signed)
Patient states she is having trouble with the with drawing up the MTX. Patient states she would like to pick the MTX in 4 ML vials. Patient states it is taking her 45 minutes to pull up medication. Patient would like to receive 4 mL vials.   Last Visit: 09/08/17 Next Visit: 12/09/17 Labs: 10/05/17 CMP WNL. CBC stable. CK is 16.  Okay to refill per Dr. Estanislado Pandy

## 2017-11-02 ENCOUNTER — Encounter: Payer: Self-pay | Admitting: Rheumatology

## 2017-11-02 ENCOUNTER — Ambulatory Visit: Payer: Medicare Other

## 2017-11-02 ENCOUNTER — Other Ambulatory Visit: Payer: Self-pay

## 2017-11-02 DIAGNOSIS — M79604 Pain in right leg: Secondary | ICD-10-CM | POA: Diagnosis not present

## 2017-11-02 DIAGNOSIS — M79605 Pain in left leg: Secondary | ICD-10-CM

## 2017-11-02 DIAGNOSIS — R262 Difficulty in walking, not elsewhere classified: Secondary | ICD-10-CM | POA: Diagnosis not present

## 2017-11-02 DIAGNOSIS — M6281 Muscle weakness (generalized): Secondary | ICD-10-CM | POA: Diagnosis not present

## 2017-11-02 NOTE — Patient Instructions (Signed)
Handout on standing hamstrings/hip flexor stretch, seated SKTC/cross body piriformis and fig 4 piriformis.

## 2017-11-02 NOTE — Therapy (Signed)
[place] [facility]IN [facility] SERVICES [address] [place], [place], [address] Phone: [phone]   Fax:  [phone]  Physical Therapy Treatment  Patient Details  Name: Marisa Orozco MRN: [id number] Date of Birth: Jan 26, 1951 Referring Provider: Bo Merino, MD (Rheumatology)   Encounter Date: 11/02/2017  PT End of Session - 11/02/17 1657    Visit Number  20    Number of Visits  23    Date for PT Re-Evaluation  11/29/17    PT Start Time  1045    PT Stop Time  1150    PT Time Calculation (min)  65 min    Activity Tolerance  Patient tolerated treatment well    Behavior During Therapy  [facility] for tasks assessed/performed       Past Medical History:  Diagnosis Date  . Allergy   . Cancer ([place])    basal cell carcinoma - face  . 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 Former smoker 02/04/2016  . GERD (gastroesophageal reflux disease)    otc med prn  . PONV (postoperative nausea and vomiting)   . Sjogren's disease ([place]) 02/04/2016   Positive ANA, Positive Ro, Positive CCP, Negative RF, Parotitis, Positive Sicca  . Sjogren's syndrome ([place])    from MD note    Past Surgical History:  Procedure Laterality Date  . BREAST SURGERY     augmentation  . BUNIONECTOMY     right foot  . COLONOSCOPY    . DIAGNOSTIC LAPAROSCOPY    . DILATION AND CURETTAGE OF UTERUS  04/15/2011   Procedure: DILATATION AND CURETTAGE;  Surgeon: [name];  Location: [facility];  Service: Gynecology;  Laterality: N/A;  . MUSCLE BIOPSY Left 01/02/2016   Procedure: MUSCLE BIOPSY LEFT DELTOID;  Surgeon: [name], MD;  Location: [facility];  Service: General;  Laterality: Left;  left deltoid muscle bx  . NASAL SEPTUM SURGERY    . SVD     x 2  . TONSILLECTOMY    . WISDOM TOOTH EXTRACTION      There were no vitals filed for this visit.  Subjective Assessment - 11/02/17 1653    Subjective  No  voiced complaints     Pertinent History  prolonged use of Prednisone - is weaning down; thymoma, Sjogren's disease, basal cell carcinoma - face, OA both hands      Ambulation  2 L fwd  2 L side with squat  2 L lunging   Education on pulse lunge  Glute strength  Ipsilateral side hip abd, 45 degree angle ext, ext 30x ea, B  Suspended work 1 min ea  Jog  Jack  Ski   X jack  [employer]  2 L active recover with arm drag  Core with UE in static lunge position (alternate lunge position ea ex)  Suspended work as above, 5 min  LE strength, green dumbbells  resisted squats  resisted side lunges, B  resisted fwd lunges, B  Suspended work as above, 5 min  Fall to recover with super noodle  2 L fwd  2 L side, B  Planks  Fwd 3 x 1 min, 1 reg and 2 with 1 leg ext  Side, B 2 x 1 min  Side , B 1 min with knee tucks and straight leg flexion to neutral  Active stretching  hip flexors, quad  hamstrings  static QL/ITB  Seated SKTC, piriformis, fig 4  PT Education - 11/02/17 1653    Education provided  Yes    Education Details  Pt working through [employer] with increasing independence; requires some verbal cues and some demonstration throughout.     Comprehension  Verbal cues required       PT Short Term Goals - 10/27/17 2110      PT SHORT TERM GOAL #1   Title  = LTG        PT Long Term Goals - 10/27/17 2106      PT LONG TERM GOAL #1   Title  Pt will demonstrate independence with final gym/wellness HEP, walking program and aquatic exercises to perform at [facility]    Time  4    Period  Weeks    Status  Revised    Target Date  11/29/17            Plan - 11/02/17 1658    Clinical Impression Statement  Pt requires some cues for proper technique with squat hold with UE/core strengthening also with lunging. Pt educated in adding pulse lunge with explanation that buoyant dumbbells are required for good  strengthening and the buoyancy with make ascending part of exercise easier; also pt made aware of many other hip strengthening including lunge activities pt does perform.     Rehab Potential  Fair    Clinical Impairments Affecting Rehab Potential  active autoimmune disease    PT Frequency  1x / week    PT Duration  4 weeks PT re-assessment every 30 days    PT Treatment/Interventions  ADLs/Self Care Home Management;Aquatic Therapy;Moist Heat;Gait training;Stair training;Functional mobility training;Therapeutic activities;Therapeutic exercise;Patient/family education;Passive range of motion;Energy conservation;Neuromuscular re-education;Balance training    PT Next Visit Plan  Finalize aquatic HEP; once completed route to me for D/C.  Thanks!    PT Home Exercise Plan  MedBridge: [email or web address]    Consulted and Agree with Plan of Care  Patient       Patient will benefit from skilled therapeutic intervention in order to improve the following deficits and impairments:  Decreased strength, Difficulty walking, Pain, Decreased balance, Impaired UE functional use  Visit Diagnosis: Muscle weakness (generalized)  Difficulty in walking, not elsewhere classified  Pain in right leg  Pain in left leg     Problem List Patient Active Problem List   Diagnosis Date Noted  . Primary osteoarthritis of both hands 09/08/2017  . High risk medication use 04/07/2016  . Sjogren's disease ([place]) 02/04/2016  . 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 Former smoker 02/04/2016  . 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 Fatigue 02/15/2012    Marisa Orozco 11/02/2017, 5:02 PM  [place] [facility] SERVICES [address] [place], [place], [address] Phone: [phone]   Fax:  [phone]  Name: Marisa Orozco MRN: [id number] Date of Birth: Feb 19, 1951

## 2017-11-07 ENCOUNTER — Encounter: Payer: Self-pay | Admitting: Rheumatology

## 2017-11-08 ENCOUNTER — Other Ambulatory Visit: Payer: Self-pay

## 2017-11-08 DIAGNOSIS — Z79899 Other long term (current) drug therapy: Secondary | ICD-10-CM

## 2017-11-09 ENCOUNTER — Ambulatory Visit: Payer: Medicare Other

## 2017-11-09 ENCOUNTER — Encounter: Payer: Self-pay | Admitting: Physical Therapy

## 2017-11-09 ENCOUNTER — Ambulatory Visit: Payer: Medicare Other | Attending: Rheumatology | Admitting: Physical Therapy

## 2017-11-09 DIAGNOSIS — M79605 Pain in left leg: Secondary | ICD-10-CM

## 2017-11-09 DIAGNOSIS — M79604 Pain in right leg: Secondary | ICD-10-CM | POA: Diagnosis not present

## 2017-11-09 DIAGNOSIS — M6281 Muscle weakness (generalized): Secondary | ICD-10-CM | POA: Diagnosis not present

## 2017-11-09 DIAGNOSIS — Z79899 Other long term (current) drug therapy: Secondary | ICD-10-CM | POA: Diagnosis not present

## 2017-11-09 DIAGNOSIS — R262 Difficulty in walking, not elsewhere classified: Secondary | ICD-10-CM | POA: Diagnosis not present

## 2017-11-09 NOTE — Therapy (Addendum)
[place] [facility] SERVICES [place], [place], [address] Phone: [phone]   Fax:  [phone]  Physical Therapy Treatment  Patient Details  Name: MAKAYLI BRACKEN MRN: [id number] Date of Birth: 01-15-1951 Referring Provider: Bo Merino, MD (Rheumatology)   Encounter Date: 11/09/2017    Past Medical History:  Diagnosis Date  . Allergy   . Cancer ([place])    basal cell carcinoma - face  . 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 Former smoker 02/04/2016  . GERD (gastroesophageal reflux disease)    otc med prn  . PONV (postoperative nausea and vomiting)   . Sjogren's disease ([place]) 02/04/2016   Positive ANA, Positive Ro, Positive CCP, Negative RF, Parotitis, Positive Sicca  . Sjogren's syndrome ([place])    from MD note    Past Surgical History:  Procedure Laterality Date  . BREAST SURGERY     augmentation  . BUNIONECTOMY     right foot  . COLONOSCOPY    . DIAGNOSTIC LAPAROSCOPY    . DILATION AND CURETTAGE OF UTERUS  04/15/2011   Procedure: DILATATION AND CURETTAGE;  Surgeon: [name];  Location: [facility];  Service: Gynecology;  Laterality: N/A;  . MUSCLE BIOPSY Left 01/02/2016   Procedure: MUSCLE BIOPSY LEFT DELTOID;  Surgeon: [name], MD;  Location: [facility];  Service: General;  Laterality: Left;  left deltoid muscle bx  . NASAL SEPTUM SURGERY    . SVD     x 2  . TONSILLECTOMY    . WISDOM TOOTH EXTRACTION      There were no vitals filed for this visit.   Aquatic Therapy  Ambulation  2 L fwd  2 L side with squat  2 L lunging   Education on pulse lunge  Glute strength  Ipsilateral side hip abd, 45 degree angle ext, ext 30x ea, B  Suspended work 1 min ea  Jog  Jack  Ski   X jack  [employer]  2 L active recover with arm drag  Core with UE in static lunge position (alternate lunge position ea ex)  Suspended work as above,  5 min  LE strength, green dumbbells  resisted squats  resisted side lunges, B  resisted fwd lunges, B  Suspended work as above, 5 min  Fall to recover with super noodle  2 L fwd  2 L side, B  Planks  Fwd 3 x 1 min, 1 reg and 2 with 1 leg ext  Side, B 2 x 1 min  Side , B 1 min with knee tucks and straight leg flexion to neutral  Active stretching  hip flexors, quad  hamstrings  static QL/ITB  Seated SKTC, piriformis, fig 4     Education     Education Provided Yes   Education Details HEP review. Pt was able to recall program with assist of handout and min verbal cues to clarify exercises.     Person Educated: Patient  Methods: Explanation   Comprehension: verbal cues required     PT Short Term Goals - 10/27/17 2110      PT SHORT TERM GOAL #1   Title  = LTG        PT Long Term Goals - 10/27/17 2106      PT LONG TERM GOAL #1   Title  Pt will demonstrate independence with final gym/wellness HEP, walking program and aquatic exercises to perform at  [facility]    Time  4    Period  Weeks    Status  Revised    Target Date  11/29/17              Patient will benefit from skilled therapeutic intervention in order to improve the following deficits and impairments:  Decreased strength, Difficulty walking, Pain, Decreased balance, Impaired UE functional use  Visit Diagnosis: Muscle weakness (generalized)  Difficulty in walking, not elsewhere classified  Pain in right leg  Pain in left leg     Problem List Patient Active Problem List   Diagnosis Date Noted  . Primary osteoarthritis of both hands 09/08/2017  . High risk medication use 04/07/2016  . Sjogren's disease ([place]) 02/04/2016  . 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 Former smoker 02/04/2016  . 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 Fatigue 02/15/2012   Chesley Noon, PTA 11/16/17, 1:18 PM  Chesley Noon 11/16/2017, 1:18 PM  [place] MA[facility]  SERVICES [address] [place], [place], [address] Phone: [phone]   Fax:  [phone]  Name: RYLINN LINZY MRN: [id number] Date of Birth: 22-Oct-1950

## 2017-11-10 LAB — CBC WITH DIFFERENTIAL/PLATELET
BASOS PCT: 0.7 %
Basophils Absolute: 64 cells/uL (ref 0–200)
Eosinophils Absolute: 100 cells/uL (ref 15–500)
Eosinophils Relative: 1.1 %
HEMATOCRIT: 47.4 % — AB (ref 35.0–45.0)
HEMOGLOBIN: 15.8 g/dL — AB (ref 11.7–15.5)
LYMPHS ABS: 2958 {cells}/uL (ref 850–3900)
MCH: 30.4 pg (ref 27.0–33.0)
MCHC: 33.3 g/dL (ref 32.0–36.0)
MCV: 91.3 fL (ref 80.0–100.0)
MPV: 11.3 fL (ref 7.5–12.5)
Monocytes Relative: 7.1 %
NEUTROS ABS: 5333 {cells}/uL (ref 1500–7800)
Neutrophils Relative %: 58.6 %
Platelets: 209 10*3/uL (ref 140–400)
RBC: 5.19 10*6/uL — AB (ref 3.80–5.10)
RDW: 15.7 % — ABNORMAL HIGH (ref 11.0–15.0)
Total Lymphocyte: 32.5 %
WBC: 9.1 10*3/uL (ref 3.8–10.8)
WBCMIX: 646 {cells}/uL (ref 200–950)

## 2017-11-10 LAB — COMPLETE METABOLIC PANEL WITH GFR
AG RATIO: 2 (calc) (ref 1.0–2.5)
ALBUMIN MSPROF: 4.5 g/dL (ref 3.6–5.1)
ALKALINE PHOSPHATASE (APISO): 49 U/L (ref 33–130)
ALT: 25 U/L (ref 6–29)
AST: 18 U/L (ref 10–35)
BUN: 15 mg/dL (ref 7–25)
CO2: 28 mmol/L (ref 20–32)
Calcium: 9.6 mg/dL (ref 8.6–10.4)
Chloride: 106 mmol/L (ref 98–110)
Creat: 0.81 mg/dL (ref 0.50–0.99)
GFR, EST NON AFRICAN AMERICAN: 76 mL/min/{1.73_m2} (ref >=60)
GFR, Est African American: 88 mL/min/{1.73_m2} (ref >=60)
Globulin: 2.2 g/dL (calc) (ref 1.9–3.7)
Glucose, Bld: 84 mg/dL (ref 65–99)
POTASSIUM: 4.1 mmol/L (ref 3.5–5.3)
SODIUM: 144 mmol/L (ref 135–146)
Total Bilirubin: 0.6 mg/dL (ref 0.2–1.2)
Total Protein: 6.7 g/dL (ref 6.1–8.1)

## 2017-11-10 LAB — CK: CK TOTAL: 25 U/L — AB (ref 29–143)

## 2017-11-10 NOTE — Progress Notes (Signed)
Labs are stable.

## 2017-11-16 ENCOUNTER — Ambulatory Visit: Payer: Medicare Other

## 2017-11-23 ENCOUNTER — Ambulatory Visit: Payer: Medicare Other

## 2017-11-23 ENCOUNTER — Other Ambulatory Visit: Payer: Self-pay

## 2017-11-23 DIAGNOSIS — M79605 Pain in left leg: Secondary | ICD-10-CM | POA: Diagnosis not present

## 2017-11-23 DIAGNOSIS — R262 Difficulty in walking, not elsewhere classified: Secondary | ICD-10-CM | POA: Diagnosis not present

## 2017-11-23 DIAGNOSIS — M6281 Muscle weakness (generalized): Secondary | ICD-10-CM

## 2017-11-23 DIAGNOSIS — M79604 Pain in right leg: Secondary | ICD-10-CM

## 2017-11-23 NOTE — Patient Instructions (Signed)
Pt given laminated written aquatic HEP

## 2017-11-23 NOTE — Therapy (Signed)
[place] MAIN [facility] SERVICES [address] [place], [place], [address] Phone: [phone]   Fax:  [phone]  Physical Therapy Treatment  Patient Details  Name: Marisa Orozco MRN: [id number] Date of Birth: May 30, 1950 Referring Provider: Bo Merino, MD (Rheumatology)   Encounter Date: 11/23/2017  PT End of Session - 11/23/17 1509    Visit Number  22    Number of Visits  23    Date for PT Re-Evaluation  11/29/17    Authorization Type  Medicare, UHC    PT Start Time  0940    PT Stop Time  1030    PT Time Calculation (min)  50 min    Activity Tolerance  Patient tolerated treatment well    Behavior During Therapy  [facility] for tasks assessed/performed       Past Medical History:  Diagnosis Date  . Allergy   . Cancer ([place])    basal cell carcinoma - face  . 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 Former smoker 02/04/2016  . GERD (gastroesophageal reflux disease)    otc med prn  . PONV (postoperative nausea and vomiting)   . Sjogren's disease ([place]) 02/04/2016   Positive ANA, Positive Ro, Positive CCP, Negative RF, Parotitis, Positive Sicca  . Sjogren's syndrome ([place])    from MD note    Past Surgical History:  Procedure Laterality Date  . BREAST SURGERY     augmentation  . BUNIONECTOMY     right foot  . COLONOSCOPY    . DIAGNOSTIC LAPAROSCOPY    . DILATION AND CURETTAGE OF UTERUS  04/15/2011   Procedure: DILATATION AND CURETTAGE;  Surgeon: [name];  Location: [facility];  Service: Gynecology;  Laterality: N/A;  . MUSCLE BIOPSY Left 01/02/2016   Procedure: MUSCLE BIOPSY LEFT DELTOID;  Surgeon: [name], MD;  Location: [place] [facility];  Service: General;  Laterality: Left;  left deltoid muscle bx  . NASAL SEPTUM SURGERY    . SVD     x 2  . TONSILLECTOMY    . WISDOM TOOTH EXTRACTION      There were no vitals filed for this visit.  Subjective Assessment -  11/23/17 1506    Subjective  Pt has no voiced complaints. Pt aware today is last aquatic day and feels prepared to continue independently.     Pertinent History  prolonged use of Prednisone - is weaning down; thymoma, Sjogren's disease, basal cell carcinoma - face, OA both hands      Enters/exits via ramp  Warm up ambulation  2 L fwd  2 L side with squat  2 L fwd lunge   Glute strengthening, 20x each ipsilateral side; B  hip abd  hip 45 degree angle ext  hip ext  Cardio interval, 1 min ea (5 min total); followed by 1 L recovery with posterior arm drag  jog  jack  ski  x jack  mogul  LE strength, green dumbbells, 20x ea  squat  side lunge, B  stepping fwd lunge, B  Cardio interval as above   Core with UE work in lunge working position, 20x ea B (alternating leg position each set)  triceps pressdown  sh abd/add  sh flex/ext  sh horiz abd/add  Cardio interval as above  Core strength, green dumbbells, 20x ea  fwd flex  side flex, B  Cardio interval as above  Total body strength, high plank position,  20x ea B  alternating knee tucks  alternating cross knee tucks  alternating hip abd  Cardio interval as above  Cool down active stretching  hamstrings/quads/hip flexor  Abd/adductors                                PT Education - 11/23/17 1509    Education provided  Yes    Education Details  difference of focus between lunging exercises.       PT Short Term Goals - 10/27/17 2110      PT SHORT TERM GOAL #1   Title  = LTG        PT Long Term Goals - 10/27/17 2106      PT LONG TERM GOAL #1   Title  Pt will demonstrate independence with final gym/wellness HEP, walking program and aquatic exercises to perform at [facility]    Time  4    Period  Weeks    Status  Revised    Target Date  11/29/17            Plan - 11/23/17 1510    Clinical Impression Statement  Pt performs aquatic program well with only  occasional cues for improved technique. Pt performs 50 minutes with instruction and an additional 10 to finish independently. Pt has joined a senior center with indoor pool to continue aquatics independently    Rehab Potential  Fair    Clinical Impairments Affecting Rehab Potential  active autoimmune disease    PT Frequency  1x / week    PT Duration  4 weeks    PT Treatment/Interventions  ADLs/Self Care Home Management;Aquatic Therapy;Moist Heat;Gait training;Stair training;Functional mobility training;Therapeutic activities;Therapeutic exercise;Patient/family education;Passive range of motion;Energy conservation;Neuromuscular re-education;Balance training    PT Next Visit Plan  continue with HEP and route to primary PT for D/C when appropriate    PT Home Exercise Plan  MedBridge: [email or web address]    Consulted and Agree with Plan of Care  Patient       Patient will benefit from skilled therapeutic intervention in order to improve the following deficits and impairments:  Decreased strength, Difficulty walking, Pain, Decreased balance, Impaired UE functional use  Visit Diagnosis: Muscle weakness (generalized)  Difficulty in walking, not elsewhere classified  Pain in right leg  Pain in left leg     Problem List Patient Active Problem List   Diagnosis Date Noted  . Primary osteoarthritis of both hands 09/08/2017  . High risk medication use 04/07/2016  . Sjogren's disease ([place]) 02/04/2016  . 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 Former smoker 02/04/2016  . 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 Fatigue 02/15/2012    Larae Grooms 11/23/2017, 3:12 PM  [place] MAIN [facility] SERVICES [address] [place], [place], [address] Phone: [phone]   Fax:  [phone]  Name: Marisa Orozco MRN: [id number] Date of Birth: 08/17/1950

## 2017-11-25 NOTE — Progress Notes (Signed)
Office Visit Note  Patient: Marisa Orozco             Date of Birth: 1950-09-04           MRN: [id number]             PCP: Caryl Bis, MD Referring: Caryl Bis, MD Visit Date: 12/09/2017 Occupation: @GUAROCC @  Subjective:  Medication management  History of Present Illness: Marisa Orozco is a 67 y.o. female history of dermatomyositis Sjogren's and osteoarthritis.  She has been on tapering dose of prednisone.  She is currently taking prednisone 10 mg a day.  She denies any muscle weakness or tenderness.  She has occasional discomfort in her hands.  He needs to have sicca symptoms.  She experiences some fatigue after the dose of methotrexate otherwise her fatigue overall is better.  She has noticed some redness on her face,she was seen by Dr. Elvera Lennox.  He diagnosed her with possible rosacea and advised MetroGel topical.  Activities of Daily Living:  Patient reports morning stiffness for 0 minutes.   Patient Denies nocturnal pain.  Difficulty dressing/grooming: Denies Difficulty climbing stairs: Denies Difficulty getting out of chair: Denies Difficulty using hands for taps, buttons, cutlery, and/or writing: Denies  Review of Systems  Constitutional: Positive for fatigue. Negative for night sweats, weight gain and weight loss.  HENT: Positive for mouth dryness. Negative for mouth sores, trouble swallowing, trouble swallowing and nose dryness.   Eyes: Positive for dryness. Negative for pain, redness and visual disturbance.  Respiratory: Negative for cough, shortness of breath and difficulty breathing.   Cardiovascular: Negative for chest pain, palpitations, hypertension, irregular heartbeat and swelling in legs/feet.  Gastrointestinal: Negative for blood in stool, constipation and diarrhea.  Endocrine: Negative for increased urination.  Genitourinary: Negative for difficulty urinating and vaginal dryness.  Musculoskeletal: Positive for arthralgias and joint pain. Negative  for joint swelling, myalgias, muscle weakness, morning stiffness, muscle tenderness and myalgias.  Skin: Positive for rash and sensitivity to sunlight. Negative for color change, hair loss, skin tightness and ulcers.  Allergic/Immunologic: Negative for susceptible to infections.  Neurological: Negative for dizziness, numbness, headaches, memory loss, night sweats and weakness.  Hematological: Negative for swollen glands.  Psychiatric/Behavioral: Negative for depressed mood and sleep disturbance. The patient is not nervous/anxious.     PMFS History:  Patient Active Problem List   Diagnosis Date Noted  . Primary osteoarthritis of both hands 09/08/2017  . High risk medication use 04/07/2016  . Sjogren's disease ([place]) 02/04/2016  . 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 Former smoker 02/04/2016  . 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 Fatigue 02/15/2012    Past Medical History:  Diagnosis Date  . Allergy   . Cancer ([place])    basal cell carcinoma - face  . 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 Former smoker 02/04/2016  . GERD (gastroesophageal reflux disease)    otc med prn  . PONV (postoperative nausea and vomiting)   . Sjogren's disease ([place]) 02/04/2016   Positive ANA, Positive Ro, Positive CCP, Negative RF, Parotitis, Positive Sicca  . Sjogren's syndrome ([place])    from MD note    Family History  Problem Relation Age of Onset  . COPD Mother   . Heart disease Mother   . Diabetes Father   . Kidney disease Father   . Epilepsy Daughter   . Colon cancer Neg Hx   . Esophageal cancer Neg Hx   .  Stomach cancer Neg Hx   . Rectal cancer Neg Hx    Past Surgical History:  Procedure Laterality Date  . BREAST SURGERY     augmentation  . BUNIONECTOMY     right foot  . COLONOSCOPY    . DIAGNOSTIC LAPAROSCOPY    . DILATION AND CURETTAGE OF UTERUS  04/15/2011   Procedure: DILATATION AND CURETTAGE;  Surgeon: [name];  Location: [facility];  Service:  Gynecology;  Laterality: N/A;  . MUSCLE BIOPSY Left 01/02/2016   Procedure: MUSCLE BIOPSY LEFT DELTOID;  Surgeon: [name], MD;  Location: [facility];  Service: General;  Laterality: Left;  left deltoid muscle bx  . NASAL SEPTUM SURGERY    . SVD     x 2  . TONSILLECTOMY    . WISDOM TOOTH EXTRACTION     Social History   Social History Narrative  . Not on file    Objective: Vital Signs: BP (!) 149/75 (BP Location: Left Arm, Patient Position: Sitting, Cuff Size: Normal)   Pulse 72   Ht 5\' 6"  (1.676 m)   Wt 162 lb 12.8 oz (73.8 kg)   BMI 26.28 kg/m    Physical Exam  Constitutional: She is oriented to person, place, and time. She appears well-developed and well-nourished.  HENT:  Head: Normocephalic and atraumatic.  Eyes: Conjunctivae and EOM are normal.  Neck: Normal range of motion.  Cardiovascular: Normal rate, regular rhythm, normal heart sounds and intact distal pulses.  Pulmonary/Chest: Effort normal and breath sounds normal.  Abdominal: Soft. Bowel sounds are normal.  Lymphadenopathy:    She has no cervical adenopathy.  Neurological: She is alert and oriented to person, place, and time.  Skin: Skin is warm and dry. Capillary refill takes less than 2 seconds.  Facial erythema noted.  Psychiatric: She has a normal mood and affect. Her behavior is normal.  Nursing note and vitals reviewed.    Musculoskeletal Exam: C-spine thoracic lumbar spine good range of motion.  Shoulder joints elbow joints wrist joint MCPs PIPs DIPs were in good range of motion with no synovitis.  Hip joints knee joints ankles MTPs PIPs been good range of motion with no synovitis.  CDAI Exam: CDAI Score: Not documented Patient Global Assessment: Not documented; Provider Global Assessment: Not documented Swollen: Not documented; Tender: Not documented Joint Exam   Not documented   There is currently no information documented on the homunculus. Go to the Rheumatology activity  and complete the homunculus joint exam.  Investigation: No additional findings.  Imaging: No results found.  Recent Labs: Lab Results  Component Value Date   WBC 6.8 12/07/2017   HGB 14.7 12/07/2017   PLT 211 12/07/2017   NA 144 12/07/2017   K 3.5 12/07/2017   CL 105 12/07/2017   CO2 30 12/07/2017   GLUCOSE 70 12/07/2017   BUN 15 12/07/2017   CREATININE 0.97 12/07/2017   BILITOT 0.8 12/07/2017   ALKPHOS 44 10/12/2016   AST 37 (H) 12/07/2017   ALT 37 (H) 12/07/2017   PROT 6.7 12/07/2017   ALBUMIN 4.2 10/12/2016   CALCIUM 9.5 12/07/2017   GFRAA 71 12/07/2017   QFTBGOLDPLUS NEGATIVE 07/07/2017  CK 23  Speciality Comments: PLQ Eye Exam: 07/12/17 WNL @ [facility] Follow up 1 year.  Procedures:  No procedures performed Allergies: Codeine; Flagyl [metronidazole hcl]; Nexium [esomeprazole magnesium]; Imuran [azathioprine]; Sulfa antibiotics; and Sulfa drugs cross reactors   Assessment / Plan:     Visit Diagnoses: Dermatomyositis (  [place]) - Jo 1+, positive muscle biopsy for dermatomyositis.  Patient is doing well on combination of methotrexate and prednisone.  She has no muscular weakness or tenderness.  We discussed decreasing prednisone by 1 mg every 2 months.  She will continue on current dose of methotrexate.  She has mild facial erythema for which she her dermatologist advised MetroGel topical.  We will see response to that.  Her CK is in desirable range.  Sjogren's syndrome with keratoconjunctivitis sicca ([place]) -she continues to have sicca symptoms.  Over-the-counter products were discussed.  Positive ANA, positive Ro, positive anti-CCP, negative rheumatoid factor, SPEP negative.   High risk medication use - MTX 0.8 ml sq q wk, folic acid 2mg  po qd, prednisone 10 mg po qd. her labs are stable.  LFTs are mildly elevated we will continue to monitor.  Primary osteoarthritis of both hands-she has mild discomfort from osteoarthritis.  Other fatigue-she relates  fatigue to methotrexate dose.  Thymoma - She gets follow-up [facility].    Former smoker   Orders: No orders of the defined types were placed in this encounter.  Meds ordered this encounter  Medications  . predniSONE (DELTASONE) 5 MG tablet    Sig: Take 1 tablet (5 mg total) by mouth daily with breakfast.    Dispense:  60 tablet    Refill:  0  . predniSONE (DELTASONE) 1 MG tablet    Sig: Take 4 tablets by mouth daily with one 5mg  tablet. Follow taper as scheduled.    Dispense:  240 tablet    Refill:  0    Face-to-face time spent with patient was 30 minutes. Greater than 50% of time was spent in counseling and coordination of care.  Follow-Up Instructions: Return in about 3 months (around 03/10/2018) for Dermatomyositis, Sjogren's.   Bo Merino, MD  Note - This record has been created using Editor, commissioning.  Chart creation errors have been sought, but may not always  have been located. Such creation errors do not reflect on  the standard of medical care.

## 2017-11-30 ENCOUNTER — Other Ambulatory Visit: Payer: Self-pay | Admitting: *Deleted

## 2017-11-30 ENCOUNTER — Encounter: Payer: Self-pay | Admitting: Rheumatology

## 2017-11-30 DIAGNOSIS — Z79899 Other long term (current) drug therapy: Secondary | ICD-10-CM

## 2017-11-30 DIAGNOSIS — Z8639 Personal history of other endocrine, nutritional and metabolic disease: Secondary | ICD-10-CM

## 2017-12-07 DIAGNOSIS — L718 Other rosacea: Secondary | ICD-10-CM | POA: Diagnosis not present

## 2017-12-07 DIAGNOSIS — M3501 Sicca syndrome with keratoconjunctivitis: Secondary | ICD-10-CM | POA: Diagnosis not present

## 2017-12-07 DIAGNOSIS — E559 Vitamin D deficiency, unspecified: Secondary | ICD-10-CM | POA: Diagnosis not present

## 2017-12-07 DIAGNOSIS — Z85828 Personal history of other malignant neoplasm of skin: Secondary | ICD-10-CM | POA: Diagnosis not present

## 2017-12-07 DIAGNOSIS — Z79899 Other long term (current) drug therapy: Secondary | ICD-10-CM | POA: Diagnosis not present

## 2017-12-07 DIAGNOSIS — R5383 Other fatigue: Secondary | ICD-10-CM | POA: Diagnosis not present

## 2017-12-07 DIAGNOSIS — M3390 Dermatopolymyositis, unspecified, organ involvement unspecified: Secondary | ICD-10-CM | POA: Diagnosis not present

## 2017-12-07 DIAGNOSIS — Z8639 Personal history of other endocrine, nutritional and metabolic disease: Secondary | ICD-10-CM | POA: Diagnosis not present

## 2017-12-08 LAB — CBC WITH DIFFERENTIAL/PLATELET
BASOS PCT: 0.3 %
Basophils Absolute: 20 cells/uL (ref 0–200)
Eosinophils Absolute: 252 cells/uL (ref 15–500)
Eosinophils Relative: 3.7 %
HCT: 44.4 % (ref 35.0–45.0)
Hemoglobin: 14.7 g/dL (ref 11.7–15.5)
Lymphs Abs: 2149 cells/uL (ref 850–3900)
MCH: 30.8 pg (ref 27.0–33.0)
MCHC: 33.1 g/dL (ref 32.0–36.0)
MCV: 92.9 fL (ref 80.0–100.0)
MPV: 10.7 fL (ref 7.5–12.5)
Monocytes Relative: 7.2 %
NEUTROS PCT: 57.2 %
Neutro Abs: 3890 cells/uL (ref 1500–7800)
PLATELETS: 211 10*3/uL (ref 140–400)
RBC: 4.78 10*6/uL (ref 3.80–5.10)
RDW: 14.4 % (ref 11.0–15.0)
Total Lymphocyte: 31.6 %
WBC mixed population: 490 cells/uL (ref 200–950)
WBC: 6.8 10*3/uL (ref 3.8–10.8)

## 2017-12-08 LAB — COMPLETE METABOLIC PANEL WITH GFR
AG Ratio: 1.8 (calc) (ref 1.0–2.5)
ALBUMIN MSPROF: 4.3 g/dL (ref 3.6–5.1)
ALT: 37 U/L — ABNORMAL HIGH (ref 6–29)
AST: 37 U/L — ABNORMAL HIGH (ref 10–35)
Alkaline phosphatase (APISO): 45 U/L (ref 33–130)
BUN: 15 mg/dL (ref 7–25)
CALCIUM: 9.5 mg/dL (ref 8.6–10.4)
CO2: 30 mmol/L (ref 20–32)
CREATININE: 0.97 mg/dL (ref 0.50–0.99)
Chloride: 105 mmol/L (ref 98–110)
GFR, EST NON AFRICAN AMERICAN: 61 mL/min/{1.73_m2} (ref >=60)
GFR, Est African American: 71 mL/min/{1.73_m2} (ref >=60)
GLOBULIN: 2.4 g/dL (ref 1.9–3.7)
Glucose, Bld: 70 mg/dL (ref 65–99)
Potassium: 3.5 mmol/L (ref 3.5–5.3)
SODIUM: 144 mmol/L (ref 135–146)
Total Bilirubin: 0.8 mg/dL (ref 0.2–1.2)
Total Protein: 6.7 g/dL (ref 6.1–8.1)

## 2017-12-08 LAB — VITAMIN D 25 HYDROXY (VIT D DEFICIENCY, FRACTURES): VIT D 25 HYDROXY: 42 ng/mL (ref 30–100)

## 2017-12-08 LAB — CK: Total CK: 23 U/L — ABNORMAL LOW (ref 29–143)

## 2017-12-08 NOTE — Progress Notes (Signed)
Labs are stable.

## 2017-12-09 ENCOUNTER — Ambulatory Visit (INDEPENDENT_AMBULATORY_CARE_PROVIDER_SITE_OTHER): Payer: Medicare Other | Admitting: Rheumatology

## 2017-12-09 ENCOUNTER — Encounter: Payer: Self-pay | Admitting: Rheumatology

## 2017-12-09 VITALS — BP 149/75 | HR 72 | Ht 66.0 in | Wt 162.8 lb

## 2017-12-09 DIAGNOSIS — M3501 Sicca syndrome with keratoconjunctivitis: Secondary | ICD-10-CM | POA: Diagnosis not present

## 2017-12-09 DIAGNOSIS — M19041 Primary osteoarthritis, right hand: Secondary | ICD-10-CM | POA: Diagnosis not present

## 2017-12-09 DIAGNOSIS — Z87891 Personal history of nicotine dependence: Secondary | ICD-10-CM | POA: Diagnosis not present

## 2017-12-09 DIAGNOSIS — M3390 Dermatopolymyositis, unspecified, organ involvement unspecified: Secondary | ICD-10-CM | POA: Diagnosis not present

## 2017-12-09 DIAGNOSIS — M19042 Primary osteoarthritis, left hand: Secondary | ICD-10-CM

## 2017-12-09 DIAGNOSIS — D150 Benign neoplasm of thymus: Secondary | ICD-10-CM | POA: Diagnosis not present

## 2017-12-09 DIAGNOSIS — Z79899 Other long term (current) drug therapy: Secondary | ICD-10-CM | POA: Diagnosis not present

## 2017-12-09 DIAGNOSIS — D4989 Neoplasm of unspecified behavior of other specified sites: Secondary | ICD-10-CM

## 2017-12-09 DIAGNOSIS — R5383 Other fatigue: Secondary | ICD-10-CM | POA: Diagnosis not present

## 2017-12-09 MED ORDER — PREDNISONE 1 MG PO TABS: ORAL_TABLET | ORAL | 0 refills | Status: DC

## 2017-12-09 MED ORDER — PREDNISONE 5 MG PO TABS: 5.0000 mg | ORAL_TABLET | Freq: Every day | ORAL | 0 refills | Status: DC

## 2017-12-09 NOTE — Patient Instructions (Signed)
Standing Labs We placed an order today for your standing lab work.    Please come back and get your standing labs in October and every month  We have open lab Monday through Friday from 8:30-11:30 AM and 1:30-4:00 PM  at the office of Dr. Bo Merino.   You may experience shorter wait times on Monday and Friday afternoons. The office is located at [address] [place], [place], [place] [address] No appointment is necessary.   Labs are drawn by [facility].  You may receive a bill from [facility] for your lab work. If you have any questions regarding directions or hours of operation,  please call [phone].

## 2017-12-10 ENCOUNTER — Other Ambulatory Visit: Payer: Self-pay | Admitting: Rheumatology

## 2017-12-13 ENCOUNTER — Telehealth: Payer: Self-pay | Admitting: *Deleted

## 2017-12-13 ENCOUNTER — Encounter: Payer: Self-pay | Admitting: Rheumatology

## 2017-12-13 MED ORDER — PREDNISONE 1 MG PO TABS: ORAL_TABLET | ORAL | 0 refills | Status: DC

## 2017-12-13 MED ORDER — PREDNISONE 5 MG PO TABS: 5.0000 mg | ORAL_TABLET | Freq: Every day | ORAL | 0 refills | Status: DC

## 2017-12-14 NOTE — Telephone Encounter (Signed)
Prescriptions resent to the correct pharmacy.

## 2017-12-29 ENCOUNTER — Encounter: Payer: Self-pay | Admitting: Rheumatology

## 2017-12-29 ENCOUNTER — Telehealth: Payer: Self-pay | Admitting: Rheumatology

## 2017-12-29 DIAGNOSIS — Z79899 Other long term (current) drug therapy: Secondary | ICD-10-CM

## 2017-12-29 DIAGNOSIS — M3390 Dermatopolymyositis, unspecified, organ involvement unspecified: Secondary | ICD-10-CM | POA: Diagnosis not present

## 2017-12-29 NOTE — Telephone Encounter (Signed)
Sed Rate and CK have been released, per Seth Bake.

## 2017-12-29 NOTE — Telephone Encounter (Signed)
Patient called requesting her labwork orders be sent to [facility] on [address].  Patient states she will be going today.

## 2017-12-30 ENCOUNTER — Encounter: Payer: Self-pay | Admitting: Rheumatology

## 2017-12-30 LAB — CK: Total CK: 30 U/L (ref 29–143)

## 2017-12-30 LAB — SEDIMENTATION RATE: SED RATE: 2 mm/h (ref 0–30)

## 2017-12-30 NOTE — Telephone Encounter (Signed)
Labs are normal.  There is no indication of flare.

## 2018-01-07 DIAGNOSIS — Z23 Encounter for immunization: Secondary | ICD-10-CM | POA: Diagnosis not present

## 2018-01-10 DIAGNOSIS — M3390 Dermatopolymyositis, unspecified, organ involvement unspecified: Secondary | ICD-10-CM | POA: Diagnosis not present

## 2018-01-10 DIAGNOSIS — Z85828 Personal history of other malignant neoplasm of skin: Secondary | ICD-10-CM | POA: Diagnosis not present

## 2018-01-17 ENCOUNTER — Other Ambulatory Visit: Payer: Self-pay | Admitting: Rheumatology

## 2018-01-17 ENCOUNTER — Encounter: Payer: Self-pay | Admitting: Rheumatology

## 2018-01-17 MED ORDER — "TUBERCULIN SYRINGE 27G X 1/2"" 1 ML MISC": 3 refills | Status: DC

## 2018-01-17 NOTE — Telephone Encounter (Signed)
Last Visit: 12/09/17 Next visit: 03/15/18 Labs: 12/07/17 Stable  Okay to refill per Dr. Estanislado Pandy

## 2018-02-03 ENCOUNTER — Encounter: Payer: Self-pay | Admitting: Rheumatology

## 2018-02-03 ENCOUNTER — Telehealth: Payer: Self-pay | Admitting: *Deleted

## 2018-02-03 ENCOUNTER — Ambulatory Visit (INDEPENDENT_AMBULATORY_CARE_PROVIDER_SITE_OTHER): Payer: Medicare Other

## 2018-02-03 ENCOUNTER — Ambulatory Visit (INDEPENDENT_AMBULATORY_CARE_PROVIDER_SITE_OTHER): Payer: Medicare Other | Admitting: Rheumatology

## 2018-02-03 VITALS — BP 132/76 | HR 67 | Resp 12 | Ht 66.5 in | Wt 173.8 lb

## 2018-02-03 DIAGNOSIS — M19042 Primary osteoarthritis, left hand: Secondary | ICD-10-CM | POA: Diagnosis not present

## 2018-02-03 DIAGNOSIS — M25561 Pain in right knee: Secondary | ICD-10-CM | POA: Diagnosis not present

## 2018-02-03 DIAGNOSIS — D4989 Neoplasm of unspecified behavior of other specified sites: Secondary | ICD-10-CM

## 2018-02-03 DIAGNOSIS — M3390 Dermatopolymyositis, unspecified, organ involvement unspecified: Secondary | ICD-10-CM | POA: Diagnosis not present

## 2018-02-03 DIAGNOSIS — Z79899 Other long term (current) drug therapy: Secondary | ICD-10-CM

## 2018-02-03 DIAGNOSIS — D150 Benign neoplasm of thymus: Secondary | ICD-10-CM

## 2018-02-03 DIAGNOSIS — Z8639 Personal history of other endocrine, nutritional and metabolic disease: Secondary | ICD-10-CM

## 2018-02-03 DIAGNOSIS — M1711 Unilateral primary osteoarthritis, right knee: Secondary | ICD-10-CM | POA: Diagnosis not present

## 2018-02-03 DIAGNOSIS — Z87891 Personal history of nicotine dependence: Secondary | ICD-10-CM

## 2018-02-03 DIAGNOSIS — M3501 Sicca syndrome with keratoconjunctivitis: Secondary | ICD-10-CM | POA: Diagnosis not present

## 2018-02-03 DIAGNOSIS — M19041 Primary osteoarthritis, right hand: Secondary | ICD-10-CM | POA: Diagnosis not present

## 2018-02-03 MED ORDER — HYDROXYCHLOROQUINE SULFATE 200 MG PO TABS: 200.0000 mg | ORAL_TABLET | Freq: Two times a day (BID) | ORAL | 0 refills | Status: DC

## 2018-02-03 NOTE — Progress Notes (Signed)
Pharmacy Note Subjective: Patient presents today to the [facility] to see Dr. Estanislado Pandy.   Patient seen by the pharmacist for counseling on Plaquenil.  Current regimen includes prednisone 10 mg daily, methotrexate 0.8 ml weekly and folic acid 2 mg daily.  Previous therapy includes Imuran (inadequate response/flare) and Plaquenil.  Objective:  CBC    Component Value Date/Time   WBC 6.8 12/07/2017 0000   RBC 4.78 12/07/2017 0000   HGB 14.7 12/07/2017 0000   HCT 44.4 12/07/2017 0000   PLT 211 12/07/2017 0000   MCV 92.9 12/07/2017 0000   MCH 30.8 12/07/2017 0000   MCHC 33.1 12/07/2017 0000   RDW 14.4 12/07/2017 0000   LYMPHSABS 2,149 12/07/2017 0000   MONOABS 360 10/12/2016 1202   EOSABS 252 12/07/2017 0000   BASOSABS 20 12/07/2017 0000   Immunization History  Administered Date(s) Administered  . Influenza, High Dose Seasonal PF 01/07/2018  . Pneumococcal Conjugate-13 01/05/2016  . Zoster Recombinat (Shingrix) 08/11/2017, 12/17/2017  Patient reports Pneumovax 23 in 2018.  Assessment/Plan:  Patient was counseled on the purpose, proper use, and adverse effects of hydroxychloroquine including nausea/diarrhea, skin rash, headaches, and sun sensitivity.  Discussed importance of annual eye exams while on hydroxychloroquine to monitor to ocular toxicity and discussed importance of frequent laboratory monitoring. Standing orders are in place.  Provided patient with eye exam form for baseline ophthalmologic exam.  Provided patient with educational materials on hydroxychloroquine and answered all questions.  Patient consented to hydroxychloroquine.  Will upload consent in the media tab.    Marisa Orozco, PharmD, [facility] Rheumatology Clinical Pharmacist  02/03/2018 2:45 PM

## 2018-02-03 NOTE — Telephone Encounter (Signed)
Patient has been scheduled for an appointment on 02/03/18 at 2 pm.

## 2018-02-03 NOTE — Patient Instructions (Addendum)
Hydroxychloroquine tablets What is this medicine? HYDROXYCHLOROQUINE (hye drox ee KLOR oh kwin) is used to treat rheumatoid arthritis and systemic lupus erythematosus. It is also used to treat malaria. This medicine may be used for other purposes; ask your health care provider or pharmacist if you have questions. COMMON BRAND NAME(S): Plaquenil, Quineprox What should I tell my health care provider before I take this medicine? They need to know if you have any of these conditions: -diabetes -eye disease, vision problems -G6PD deficiency -history of blood diseases -history of irregular heartbeat -if you often drink alcohol -kidney disease -liver disease -porphyria -psoriasis -seizures -an unusual or allergic reaction to chloroquine, hydroxychloroquine, other medicines, foods, dyes, or preservatives -pregnant or trying to get pregnant -breast-feeding How should I use this medicine? Take this medicine by mouth with a glass of water. Follow the directions on the prescription label. Avoid taking antacids within 4 hours of taking this medicine. It is best to separate these medicines by at least 4 hours. Do not cut, crush or chew this medicine. You can take it with or without food. If it upsets your stomach, take it with food. Take your medicine at regular intervals. Do not take your medicine more often than directed. Take all of your medicine as directed even if you think you are better. Do not skip doses or stop your medicine early. Talk to your pediatrician regarding the use of this medicine in children. While this drug may be prescribed for selected conditions, precautions do apply. Overdosage: If you think you have taken too much of this medicine contact a poison control center or emergency room at once. NOTE: This medicine is only for you. Do not share this medicine with others. What if I miss a dose? If you miss a dose, take it as soon as you can. If it is almost time for your next dose,  take only that dose. Do not take double or extra doses. What may interact with this medicine? Do not take this medicine with any of the following medications: -cisapride -dofetilide -dronedarone -live virus vaccines -penicillamine -pimozide -thioridazine -ziprasidone This medicine may also interact with the following medications: -ampicillin -antacids -cimetidine -cyclosporine -digoxin -medicines for diabetes, like insulin, glipizide, glyburide -medicines for seizures like carbamazepine, phenobarbital, phenytoin -mefloquine -methotrexate -other medicines that prolong the QT interval (cause an abnormal heart rhythm) -praziquantel This list may not describe all possible interactions. Give your health care provider a list of all the medicines, herbs, non-prescription drugs, or dietary supplements you use. Also tell them if you smoke, drink alcohol, or use illegal drugs. Some items may interact with your medicine. What should I watch for while using this medicine? Tell your doctor or healthcare professional if your symptoms do not start to get better or if they get worse. Avoid taking antacids within 4 hours of taking this medicine. It is best to separate these medicines by at least 4 hours. Tell your doctor or health care professional right away if you have any change in your eyesight. Your vision and blood may be tested before and during use of this medicine. This medicine can make you more sensitive to the sun. Keep out of the sun. If you cannot avoid being in the sun, wear protective clothing and use sunscreen. Do not use sun lamps or tanning beds/booths. What side effects may I notice from receiving this medicine? Side effects that you should report to your doctor or health care professional as soon as possible: -allergic reactions like skin rash,  itching or hives, swelling of the face, lips, or tongue -changes in vision -decreased hearing or ringing of the ears -redness,  blistering, peeling or loosening of the skin, including inside the mouth -seizures -sensitivity to light -signs and symptoms of a dangerous change in heartbeat or heart rhythm like chest pain; dizziness; fast or irregular heartbeat; palpitations; feeling faint or lightheaded, falls; breathing problems -signs and symptoms of liver injury like dark yellow or brown urine; general ill feeling or flu-like symptoms; light-colored stools; loss of appetite; nausea; right upper belly pain; unusually weak or tired; yellowing of the eyes or skin -signs and symptoms of low blood sugar such as feeling anxious; confusion; dizziness; increased hunger; unusually weak or tired; sweating; shakiness; cold; irritable; headache; blurred vision; fast heartbeat; loss of consciousness -uncontrollable head, mouth, neck, arm, or leg movements Side effects that usually do not require medical attention (report to your doctor or health care professional if they continue or are bothersome): -anxious -diarrhea -dizziness -hair loss -headache -irritable -loss of appetite -nausea, vomiting -stomach pain This list may not describe all possible side effects. Call your doctor for medical advice about side effects. You may report side effects to FDA at 1-800-FDA-1088. Where should I keep my medicine? Keep out of the reach of children. In children, this medicine can cause overdose with small doses. Store at room temperature between 15 and 30 degrees C (59 and 86 degrees F). Protect from moisture and light. Throw away any unused medicine after the expiration date. NOTE: This sheet is a summary. It may not cover all possible information. If you have questions about this medicine, talk to your doctor, pharmacist, or health care provider.  2018 Elsevier/Gold Standard (2015-11-06 14:16:15)  Knee Exercises Ask your health care provider which exercises are safe for you. Do exercises exactly as told by your health care provider and  adjust them as directed. It is normal to feel mild stretching, pulling, tightness, or discomfort as you do these exercises, but you should stop right away if you feel sudden pain or your pain gets worse.Do not begin these exercises until told by your health care provider. STRETCHING AND RANGE OF MOTION EXERCISES These exercises warm up your muscles and joints and improve the movement and flexibility of your knee. These exercises also help to relieve pain, numbness, and tingling. Exercise A: Knee Extension, Prone 1. Lie on your abdomen on a bed. 2. Place your left / right knee just beyond the edge of the surface so your knee is not on the bed. You can put a towel under your left / right thigh just above your knee for comfort. 3. Relax your leg muscles and allow gravity to straighten your knee. You should feel a stretch behind your left / right knee. 4. Hold this position for __________ seconds. 5. Scoot up so your knee is supported between repetitions. Repeat __________ times. Complete this stretch __________ times a day. Exercise B: Knee Flexion, Active  1. Lie on your back with both knees straight. If this causes back discomfort, bend your left / right knee so your foot is flat on the floor. 2. Slowly slide your left / right heel back toward your buttocks until you feel a gentle stretch in the front of your knee or thigh. 3. Hold this position for __________ seconds. 4. Slowly slide your left / right heel back to the starting position. Repeat __________ times. Complete this exercise __________ times a day. Exercise C: Quadriceps, Prone  1. Lie on your  abdomen on a firm surface, such as a bed or padded floor. 2. Bend your left / right knee and hold your ankle. If you cannot reach your ankle or pant leg, loop a belt around your foot and grab the belt instead. 3. Gently pull your heel toward your buttocks. Your knee should not slide out to the side. You should feel a stretch in the front of your  thigh and knee. 4. Hold this position for __________ seconds. Repeat __________ times. Complete this stretch __________ times a day. Exercise D: Hamstring, Supine 1. Lie on your back. 2. Loop a belt or towel over the ball of your left / right foot. The ball of your foot is on the walking surface, right under your toes. 3. Straighten your left / right knee and slowly pull on the belt to raise your leg until you feel a gentle stretch behind your knee. ? Do not let your left / right knee bend while you do this. ? Keep your other leg flat on the floor. 4. Hold this position for __________ seconds. Repeat __________ times. Complete this stretch __________ times a day. STRENGTHENING EXERCISES These exercises build strength and endurance in your knee. Endurance is the ability to use your muscles for a long time, even after they get tired. Exercise E: Quadriceps, Isometric  1. Lie on your back with your left / right leg extended and your other knee bent. Put a rolled towel or small pillow under your knee if told by your health care provider. 2. Slowly tense the muscles in the front of your left / right thigh. You should see your kneecap slide up toward your hip or see increased dimpling just above the knee. This motion will push the back of the knee toward the floor. 3. For __________ seconds, keep the muscle as tight as you can without increasing your pain. 4. Relax the muscles slowly and completely. Repeat __________ times. Complete this exercise __________ times a day. Exercise F: Straight Leg Raises - Quadriceps 1. Lie on your back with your left / right leg extended and your other knee bent. 2. Tense the muscles in the front of your left / right thigh. You should see your kneecap slide up or see increased dimpling just above the knee. Your thigh may even shake a bit. 3. Keep these muscles tight as you raise your leg 4-6 inches (10-15 cm) off the floor. Do not let your knee bend. 4. Hold this  position for __________ seconds. 5. Keep these muscles tense as you lower your leg. 6. Relax your muscles slowly and completely after each repetition. Repeat __________ times. Complete this exercise __________ times a day. Exercise G: Hamstring, Isometric 1. Lie on your back on a firm surface. 2. Bend your left / right knee approximately __________ degrees. 3. Dig your left / right heel into the surface as if you are trying to pull it toward your buttocks. Tighten the muscles in the back of your thighs to dig as hard as you can without increasing any pain. 4. Hold this position for __________ seconds. 5. Release the tension gradually and allow your muscles to relax completely for __________ seconds after each repetition. Repeat __________ times. Complete this exercise __________ times a day. Exercise H: Hamstring Curls  If told by your health care provider, do this exercise while wearing ankle weights. Begin with __________ weights. Then increase the weight by 1 lb (0.5 kg) increments. Do not wear ankle weights that are more than  __________. 1. Lie on your abdomen with your legs straight. 2. Bend your left / right knee as far as you can without feeling pain. Keep your hips flat against the floor. 3. Hold this position for __________ seconds. 4. Slowly lower your leg to the starting position.  Repeat __________ times. Complete this exercise __________ times a day. Exercise I: Squats (Quadriceps) 1. Stand in front of a table, with your feet and knees pointing straight ahead. You may rest your hands on the table for balance but not for support. 2. Slowly bend your knees and lower your hips like you are going to sit in a chair. ? Keep your weight over your heels, not over your toes. ? Keep your lower legs upright so they are parallel with the table legs. ? Do not let your hips go lower than your knees. ? Do not bend lower than told by your health care provider. ? If your knee pain increases,  do not bend as low. 3. Hold the squat position for __________ seconds. 4. Slowly push with your legs to return to standing. Do not use your hands to pull yourself to standing. Repeat __________ times. Complete this exercise __________ times a day. Exercise J: Wall Slides (Quadriceps)  1. Lean your back against a smooth wall or door while you walk your feet out 18-24 inches (46-61 cm) from it. 2. Place your feet hip-width apart. 3. Slowly slide down the wall or door until your knees bend __________ degrees. Keep your knees over your heels, not over your toes. Keep your knees in line with your hips. 4. Hold for __________ seconds. Repeat __________ times. Complete this exercise __________ times a day. Exercise K: Straight Leg Raises - Hip Abductors 1. Lie on your side with your left / right leg in the top position. Lie so your head, shoulder, knee, and hip line up. You may bend your bottom knee to help you keep your balance. 2. Roll your hips slightly forward so your hips are stacked directly over each other and your left / right knee is facing forward. 3. Leading with your heel, lift your top leg 4-6 inches (10-15 cm). You should feel the muscles in your outer hip lifting. ? Do not let your foot drift forward. ? Do not let your knee roll toward the ceiling. 4. Hold this position for __________ seconds. 5. Slowly return your leg to the starting position. 6. Let your muscles relax completely after each repetition. Repeat __________ times. Complete this exercise __________ times a day. Exercise L: Straight Leg Raises - Hip Extensors 1. Lie on your abdomen on a firm surface. You can put a pillow under your hips if that is more comfortable. 2. Tense the muscles in your buttocks and lift your left / right leg about 4-6 inches (10-15 cm). Keep your knee straight as you lift your leg. 3. Hold this position for __________ seconds. 4. Slowly lower your leg to the starting position. 5. Let your leg  relax completely after each repetition. Repeat __________ times. Complete this exercise __________ times a day. This information is not intended to replace advice given to you by your health care provider. Make sure you discuss any questions you have with your health care provider. Document Released: 02/04/2005 Document Revised: 12/16/2015 Document Reviewed: 01/27/2015 Elsevier Interactive Patient Education  2018 [place] are taking a medication(s) that can suppress your immune system.  The following immunizations are recommended: . Flu annually . Pneumonia (Pneumovax 23  and Prevnar 13 after age 85) . Shingrix . Hepatitis B  Please check with your PCP to make sure you are up to date.

## 2018-02-03 NOTE — Progress Notes (Signed)
Office Visit Note  Patient: Marisa Orozco             Date of Birth: 1950/05/02           MRN: [id number]             PCP: Caryl Bis, MD Referring: Caryl Bis, MD Visit Date: 02/03/2018 Occupation: @GUAROCC @  Subjective:  Right knee pain  History of Present Illness: Marisa Orozco is a 67 y.o. female with history of dermatomyositis, Sjogren's and osteoarthritis.  She states she has been having pain and discomfort in her right knee joint.  She feels that the right knee joint gives out on her at times.  She had similar symptoms last July when she had cortisone injection and had a good response to the cortisone injection.  At that time x-ray of her knee joint done at [facility] office was unremarkable.  She is also noticed some redness on her skin but no rash.  She would like to go back on Plaquenil as she felt that her skin symptoms improved while she was on Plaquenil.  Activities of Daily Living:  Patient reports morning stiffness for 0 minute.   Patient Denies nocturnal pain.  Difficulty dressing/grooming: Denies Difficulty climbing stairs: Denies Difficulty getting out of chair: Denies Difficulty using hands for taps, buttons, cutlery, and/or writing: Denies  Review of Systems  Constitutional: Negative for fatigue, night sweats, weight gain and weight loss.  HENT: Positive for mouth dryness. Negative for mouth sores, trouble swallowing, trouble swallowing and nose dryness.   Eyes: Negative for pain, redness, visual disturbance and dryness.  Respiratory: Negative for cough, shortness of breath and difficulty breathing.   Cardiovascular: Negative for chest pain, palpitations, hypertension, irregular heartbeat and swelling in legs/feet.  Gastrointestinal: Negative for blood in stool, constipation and diarrhea.  Endocrine: Negative for increased urination.  Genitourinary: Negative for vaginal dryness.  Musculoskeletal: Positive for arthralgias and joint pain. Negative  for joint swelling, myalgias, muscle weakness, morning stiffness, muscle tenderness and myalgias.  Skin: Positive for rash. Negative for color change, hair loss, skin tightness, ulcers and sensitivity to sunlight.  Allergic/Immunologic: Negative for susceptible to infections.  Neurological: Negative for dizziness, memory loss, night sweats and weakness.  Hematological: Negative for swollen glands.  Psychiatric/Behavioral: Negative for depressed mood and sleep disturbance. The patient is not nervous/anxious.     PMFS History:  Patient Active Problem List   Diagnosis Date Noted  . Primary osteoarthritis of right knee 02/03/2018  . Primary osteoarthritis of both hands 09/08/2017  . High risk medication use 04/07/2016  . Sjogren's disease ([place]) 02/04/2016  . Dermatomyositis ([place]) 02/04/2016  . Former smoker 02/04/2016  . 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 Fatigue 02/15/2012    Past Medical History:  Diagnosis Date  . Allergy   . Cancer ([place])    basal cell carcinoma - face  . Dermatomyositis ([place]) 02/04/2016   Jo 1 Positive  Skin Bx- Subtle interface Dermatitis   . Former smoker 02/04/2016  . GERD (gastroesophageal reflux disease)    otc med prn  . PONV (postoperative nausea and vomiting)   . Sjogren's disease ([place]) 02/04/2016   Positive ANA, Positive Ro, Positive CCP, Negative RF, Parotitis, Positive Sicca  . Sjogren's syndrome ([place])    from MD note    Family History  Problem Relation Age of Onset  . COPD Mother   . Heart disease Mother   . Diabetes Father   . Kidney disease Father   . Epilepsy  Daughter   . Colon cancer Neg Hx   . Esophageal cancer Neg Hx   . Stomach cancer Neg Hx   . Rectal cancer Neg Hx    Past Surgical History:  Procedure Laterality Date  . BREAST SURGERY     augmentation  . BUNIONECTOMY     right foot  . COLONOSCOPY    . DIAGNOSTIC LAPAROSCOPY    . DILATION AND CURETTAGE OF UTERUS  04/15/2011   Procedure: DILATATION AND CURETTAGE;  Surgeon: [name];   Location: [facility];  Service: Gynecology;  Laterality: N/A;  . MUSCLE BIOPSY Left 01/02/2016   Procedure: MUSCLE BIOPSY LEFT DELTOID;  Surgeon: [name], MD;  Location: [facility];  Service: General;  Laterality: Left;  left deltoid muscle bx  . NASAL SEPTUM SURGERY    . SVD     x 2  . TONSILLECTOMY    . WISDOM TOOTH EXTRACTION     Social History   Social History Narrative  . Not on file    Objective: Vital Signs: BP 132/76 (BP Location: Left Arm, Patient Position: Sitting, Cuff Size: Normal)   Pulse 67   Resp 12   Ht 5' 6.5" (1.689 m)   Wt 173 lb 12.8 oz (78.8 kg)   BMI 27.63 kg/m    Physical Exam  Constitutional: She is oriented to person, place, and time. She appears well-developed and well-nourished.  HENT:  Head: Normocephalic and atraumatic.  Eyes: Conjunctivae and EOM are normal.  Neck: Normal range of motion.  Cardiovascular: Normal rate, regular rhythm, normal heart sounds and intact distal pulses.  Pulmonary/Chest: Effort normal and breath sounds normal.  Abdominal: Soft. Bowel sounds are normal.  Lymphadenopathy:    She has no cervical adenopathy.  Neurological: She is alert and oriented to person, place, and time.  Skin: Skin is warm and dry. Capillary refill takes less than 2 seconds. There is erythema.  On face and upper extremeties  Psychiatric: She has a normal mood and affect. Her behavior is normal.  Nursing note and vitals reviewed.    Musculoskeletal Exam: C-spine thoracic lumbar spine good range of motion.  Shoulder joints elbow joints wrist joint MCPs PIPs DIPs were in good range of motion.  Hip joints knee joints ankles MTPs PIPs were in good range of motion.  She has some warmth on palpation of her right knee joint.  CDAI Exam: CDAI Score: Not documented Patient Global Assessment: Not documented; Provider Global Assessment: Not documented Swollen: Not documented; Tender: Not documented Joint Exam   Not documented   There  is currently no information documented on the homunculus. Go to the Rheumatology activity and complete the homunculus joint exam.  Investigation: No additional findings.  Imaging: Xr Knee 3 View Right  Result Date: 02/03/2018 Moderate medial compartment narrowing was noted.  Moderate patellofemoral narrowing was noted.  No chondrocalcinosis was noted.  Medial and intercondylar osteophytes were noted. Impression: These findings are consistent with osteoarthritis of the knee joint and chondromalacia patella.   Recent Labs: Lab Results  Component Value Date   WBC 6.8 12/07/2017   HGB 14.7 12/07/2017   PLT 211 12/07/2017   NA 144 12/07/2017   K 3.5 12/07/2017   CL 105 12/07/2017   CO2 30 12/07/2017   GLUCOSE 70 12/07/2017   BUN 15 12/07/2017   CREATININE 0.97 12/07/2017   BILITOT 0.8 12/07/2017   ALKPHOS 44 10/12/2016   AST 37 (H) 12/07/2017   ALT 37 (H) 12/07/2017  PROT 6.7 12/07/2017   ALBUMIN 4.2 10/12/2016   CALCIUM 9.5 12/07/2017   GFRAA 71 12/07/2017   QFTBGOLDPLUS NEGATIVE 07/07/2017    Speciality Comments: PLQ Eye Exam: 07/12/17 WNL @ [facility]. Follow up 1 year.  Procedures:  No procedures performed Allergies: Codeine; Flagyl [metronidazole hcl]; Nexium [esomeprazole magnesium]; Imuran [azathioprine]; Sulfa antibiotics; and Sulfa drugs cross reactors   Assessment / Plan:     Visit Diagnoses: Acute pain of right knee -she has been having increased pain and discomfort in her right knee joint.  She feels that her right knee joint will give out on her.  She had similar symptoms last year which responded to cortisone injection.  She states her symptoms are much worse this time.  Plan: XR KNEE 3 VIEW RIGHT.  X-ray of the knee joint showed moderate osteoarthritis and moderate chondromalacia patella.  I will schedule MRI of her right knee joint to rule out internal derangement.  Weight loss diet and exercise was discussed.  Dermatomyositis ([place]) - Jo 1+,  positive muscle biopsy for dermatomyositis.  She is on methotrexate and prednisone 10 mg p.o. daily.  She is unable to taper prednisone.  She has noticed increased redness on her skin.  She believes her redness on the skin was better while she was on Plaquenil.  She would like to restart on Plaquenil.  Per her request I will call in prescription for Plaquenil 200 mg p.o. twice daily Monday through Friday.  She is concerned that she may be able to get a flare of dermatomyositis.  We will continue to monitor her labs.- Plan: CK  High risk medication use - MTX 0.8 ml sq q wk, folic acid 2mg  po qd, prednisone 10 mg po qd.  - Plan: CBC with Differential/Platelet, COMPLETE METABOLIC PANEL WITH GFR today.  Sjogren's syndrome with keratoconjunctivitis sicca (HCC)-controlled with over-the-counter medications.  Primary osteoarthritis of both hands-she had mild osteoarthritis in her hands which is not symptomatic.  Thymoma  Former smoker  History of vitamin D deficiency-she is on supplement.  Orders: Orders Placed This Encounter  Procedures  . XR KNEE 3 VIEW RIGHT  . MR KNEE RIGHT WO CONTRAST  . CBC with Differential/Platelet  . COMPLETE METABOLIC PANEL WITH GFR  . CK   Meds ordered this encounter  Medications  . hydroxychloroquine (PLAQUENIL) 200 MG tablet    Sig: Take 1 tablet (200 mg total) by mouth 2 (two) times daily.    Dispense:  180 tablet    Refill:  0    Face-to-face time spent with patient was 30 minutes. Greater than 50% of time was spent in counseling and coordination of care.  Follow-Up Instructions: Return in about 3 months (around 05/06/2018) for Dermatomyositis, Sjogren's syndrome, Osteoarthritis.   Bo Merino, MD  Note - This record has been created using Editor, commissioning.  Chart creation errors have been sought, but may not always  have been located. Such creation errors do not reflect on  the standard of medical care.

## 2018-02-04 LAB — COMPLETE METABOLIC PANEL WITH GFR
AG RATIO: 2.1 (calc) (ref 1.0–2.5)
ALT: 38 U/L — AB (ref 6–29)
AST: 26 U/L (ref 10–35)
Albumin: 4.8 g/dL (ref 3.6–5.1)
Alkaline phosphatase (APISO): 61 U/L (ref 33–130)
BILIRUBIN TOTAL: 0.8 mg/dL (ref 0.2–1.2)
BUN: 12 mg/dL (ref 7–25)
CHLORIDE: 107 mmol/L (ref 98–110)
CO2: 27 mmol/L (ref 20–32)
Calcium: 9.9 mg/dL (ref 8.6–10.4)
Creat: 0.88 mg/dL (ref 0.50–0.99)
GFR, EST AFRICAN AMERICAN: 79 mL/min/{1.73_m2} (ref >=60)
GFR, EST NON AFRICAN AMERICAN: 68 mL/min/{1.73_m2} (ref >=60)
GLUCOSE: 108 mg/dL — AB (ref 65–99)
Globulin: 2.3 g/dL (calc) (ref 1.9–3.7)
POTASSIUM: 4.6 mmol/L (ref 3.5–5.3)
SODIUM: 142 mmol/L (ref 135–146)
TOTAL PROTEIN: 7.1 g/dL (ref 6.1–8.1)

## 2018-02-04 LAB — CBC WITH DIFFERENTIAL/PLATELET
BASOS ABS: 29 {cells}/uL (ref 0–200)
Basophils Relative: 0.4 %
Eosinophils Absolute: 37 cells/uL (ref 15–500)
Eosinophils Relative: 0.5 %
HEMATOCRIT: 43.1 % (ref 35.0–45.0)
Hemoglobin: 14.6 g/dL (ref 11.7–15.5)
LYMPHS ABS: 1365 {cells}/uL (ref 850–3900)
MCH: 32.2 pg (ref 27.0–33.0)
MCHC: 33.9 g/dL (ref 32.0–36.0)
MCV: 94.9 fL (ref 80.0–100.0)
MPV: 11.5 fL (ref 7.5–12.5)
Monocytes Relative: 4.8 %
Neutro Abs: 5519 cells/uL (ref 1500–7800)
Neutrophils Relative %: 75.6 %
Platelets: 220 10*3/uL (ref 140–400)
RBC: 4.54 10*6/uL (ref 3.80–5.10)
RDW: 13.1 % (ref 11.0–15.0)
Total Lymphocyte: 18.7 %
WBC: 7.3 10*3/uL (ref 3.8–10.8)
WBCMIX: 350 {cells}/uL (ref 200–950)

## 2018-02-04 LAB — CK: Total CK: 37 U/L (ref 29–143)

## 2018-02-04 NOTE — Progress Notes (Signed)
WNLs

## 2018-02-11 ENCOUNTER — Encounter: Payer: Self-pay | Admitting: Gastroenterology

## 2018-02-15 ENCOUNTER — Encounter: Payer: Self-pay | Admitting: Rheumatology

## 2018-02-15 ENCOUNTER — Other Ambulatory Visit: Payer: Self-pay | Admitting: Rheumatology

## 2018-02-15 ENCOUNTER — Encounter: Payer: Self-pay | Admitting: Gastroenterology

## 2018-02-15 MED ORDER — PREDNISONE 5 MG PO TABS: 5.0000 mg | ORAL_TABLET | Freq: Every day | ORAL | 0 refills | Status: DC

## 2018-02-15 NOTE — Telephone Encounter (Signed)
Last Visit: 02/03/18 Next visit: 03/15/18  Okay to refill per Dr. Estanislado Pandy

## 2018-02-16 ENCOUNTER — Telehealth: Payer: Self-pay | Admitting: Rheumatology

## 2018-02-16 MED ORDER — PREDNISONE 1 MG PO TABS: ORAL_TABLET | ORAL | 0 refills | Status: DC

## 2018-02-16 NOTE — Telephone Encounter (Signed)
Patient called stating she would like to talk to Dr. Estanislado Pandy directly for clarification on her dosage of Prednisone.  Patient states she also left a message on her MyChart.  Patient requested a return call.

## 2018-02-16 NOTE — Telephone Encounter (Signed)
Per Dr. Estanislado Pandy patient may resume Prednisone taper schedule if she would like to and taper by 1 mg every 2 months. Patient advised and verbalized understanding.

## 2018-02-21 ENCOUNTER — Other Ambulatory Visit: Payer: Self-pay

## 2018-03-01 NOTE — Progress Notes (Signed)
Office Visit Note  Patient: Marisa Orozco             Date of Birth: 11-03-1950           MRN: [id number]             PCP: Caryl Bis, MD Referring: Caryl Bis, MD Visit Date: 03/15/2018 Occupation: @GUAROCC @  Subjective:  Pain in right knee.   History of Present Illness: Marisa Orozco is a 67 y.o. female with history of dermatomyositis, Sjogren's and osteoarthritis.  She recently acquired right knee joint meniscal tear.  She was evaluated by Dr. Durward Fortes who suggested a cortisone injection.  She wants to hold off the cortisone injection at this point.  She believes the symptoms are improving.  She denies any increased muscle weakness or tenderness.  Not having much discomfort in her hands.  Activities of Daily Living:  Patient reports morning stiffness for 0 minute.   Patient Denies nocturnal pain.  Difficulty dressing/grooming: Denies Difficulty climbing stairs: Reports Difficulty getting out of chair: Denies Difficulty using hands for taps, buttons, cutlery, and/or writing: Denies  Review of Systems  Constitutional: Negative for fatigue, night sweats, weight gain and weight loss.  HENT: Positive for mouth dryness. Negative for mouth sores, trouble swallowing, trouble swallowing and nose dryness.   Eyes: Positive for dryness. Negative for pain, redness and visual disturbance.  Respiratory: Negative for cough, shortness of breath and difficulty breathing.   Cardiovascular: Negative for chest pain, palpitations, hypertension, irregular heartbeat and swelling in legs/feet.  Gastrointestinal: Negative for blood in stool, constipation and diarrhea.  Endocrine: Negative for increased urination.  Genitourinary: Negative for vaginal dryness.  Musculoskeletal: Positive for arthralgias and joint pain. Negative for joint swelling, myalgias, muscle weakness, morning stiffness, muscle tenderness and myalgias.  Skin: Negative for color change, rash, hair loss, skin tightness,  ulcers and sensitivity to sunlight.  Allergic/Immunologic: Negative for susceptible to infections.  Neurological: Negative for dizziness, memory loss, night sweats and weakness.  Hematological: Negative for swollen glands.  Psychiatric/Behavioral: Positive for sleep disturbance. Negative for depressed mood. The patient is not nervous/anxious.     PMFS History:  Patient Active Problem List   Diagnosis Date Noted  . Primary osteoarthritis of right knee 02/03/2018  . Primary osteoarthritis of both hands 09/08/2017  . High risk medication use 04/07/2016  . Sjogren's disease ([place]) 02/04/2016  . Dermatomyositis ([place]) 02/04/2016  . Former smoker 02/04/2016  . 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 Fatigue 02/15/2012    Past Medical History:  Diagnosis Date  . Allergy   . Cancer ([place])    basal cell carcinoma - face  . Dermatomyositis ([place]) 02/04/2016   Jo 1 Positive  Skin Bx- Subtle interface Dermatitis   . Former smoker 02/04/2016  . GERD (gastroesophageal reflux disease)    otc med prn  . PONV (postoperative nausea and vomiting)   . Sjogren's disease ([place]) 02/04/2016   Positive ANA, Positive Ro, Positive CCP, Negative RF, Parotitis, Positive Sicca  . Sjogren's syndrome ([place])    from MD note    Family History  Problem Relation Age of Onset  . COPD Mother   . Heart disease Mother   . Diabetes Father   . Kidney disease Father   . Epilepsy Daughter   . Colon cancer Neg Hx   . Esophageal cancer Neg Hx   . Stomach cancer Neg Hx   . Rectal cancer Neg Hx    Past Surgical History:  Procedure Laterality Date  .  BREAST SURGERY     augmentation  . BUNIONECTOMY     right foot  . COLONOSCOPY    . DIAGNOSTIC LAPAROSCOPY    . DILATION AND CURETTAGE OF UTERUS  04/15/2011   Procedure: DILATATION AND CURETTAGE;  Surgeon: [name];  Location: [facility];  Service: Gynecology;  Laterality: N/A;  . MUSCLE BIOPSY Left 01/02/2016   Procedure: MUSCLE BIOPSY LEFT DELTOID;  Surgeon: [name], MD;   Location: [facility];  Service: General;  Laterality: Left;  left deltoid muscle bx  . NASAL SEPTUM SURGERY    . SVD     x 2  . TONSILLECTOMY    . WISDOM TOOTH EXTRACTION     Social History   Social History Narrative  . Not on file   Immunization History  Administered Date(s) Administered  . Influenza, High Dose Seasonal PF 01/07/2018  . Influenza-Unspecified 01/04/2014  . Pneumococcal Conjugate-13 01/05/2016  . Zoster Recombinat (Shingrix) 08/11/2017, 12/17/2017    Objective: Vital Signs: BP 134/72 (BP Location: Left Arm, Patient Position: Sitting, Cuff Size: Normal)   Pulse 75   Resp 13   Ht 5' 6.5" (1.689 m)   Wt 175 lb (79.4 kg)   BMI 27.82 kg/m    Physical Exam  Constitutional: She is oriented to person, place, and time. She appears well-developed and well-nourished.  HENT:  Head: Normocephalic and atraumatic.  Eyes: Conjunctivae and EOM are normal.  Neck: Normal range of motion.  Cardiovascular: Normal rate, regular rhythm, normal heart sounds and intact distal pulses.  Pulmonary/Chest: Effort normal and breath sounds normal.  Abdominal: Soft. Bowel sounds are normal.  Lymphadenopathy:    She has no cervical adenopathy.  Neurological: She is alert and oriented to person, place, and time.  Skin: Skin is warm and dry. Capillary refill takes less than 2 seconds.  Psychiatric: She has a normal mood and affect. Her behavior is normal.  Nursing note and vitals reviewed.    Musculoskeletal Exam: C-spine thoracic lumbar spine good range of motion.  Shoulder joints elbow joints wrist joint MCPs PIPs DIPs been good range of motion.  She has some DIP and PIP thickening in her hands consistent with osteoarthritis.  Hip joints were in good range of motion.  She had a small effusion and warmth in her right knee joint.  There was no muscular weakness or tenderness on examination.  CDAI Exam: CDAI Score: Not documented Patient Global Assessment: Not documented;  Provider Global Assessment: Not documented Swollen: Not documented; Tender: Not documented Joint Exam   Not documented   There is currently no information documented on the homunculus. Go to the Rheumatology activity and complete the homunculus joint exam.  Investigation: No additional findings.  Imaging: Mr Knee Right Wo Contrast  Result Date: 03/09/2018 CLINICAL DATA:  Acute right knee pain and instability. EXAM: MRI OF THE RIGHT KNEE WITHOUT CONTRAST TECHNIQUE: Multiplanar, multisequence MR imaging of the knee was performed. No intravenous contrast was administered. COMPARISON:  02/03/2018 FINDINGS: MENISCI Medial meniscus: Large radial tear of the root of the posterior horn medial meniscus with phantom meniscus sign on image 14/3. Lateral meniscus:  Unremarkable LIGAMENTS Cruciates:  Unremarkable Collaterals:  Unremarkable CARTILAGE Patellofemoral: Mild degenerative chondral thinning along the patella and medially in the femoral trochlear groove. Small focus of subcortical marrow edema posteromedially along the femoral trochlear groove. Medial: Moderate degenerative chondral thinning. Mild subcortical marrow edema along the medial tibial rim. Lateral:  Unremarkable Joint:  Small knee effusion with superior plica. Popliteal  Fossa:  Unremarkable Extensor Mechanism:  Unremarkable Bones: No significant extra-articular osseous abnormalities identified. Other: No supplemental non-categorized findings. IMPRESSION: 1. Large radial tear of the root of the posterior horn medial meniscus. 2. Small knee effusion with superior plica. 3. Moderate degenerative chondral thinning in the medial compartment and mild chondral thinning along the patella. Electronically Signed   By: Van Clines M.D.   On: 03/09/2018 11:55    Recent Labs: Lab Results  Component Value Date   WBC 7.7 03/14/2018   HGB 15.4 03/14/2018   PLT 238 03/14/2018   NA 144 03/14/2018   K 4.0 03/14/2018   CL 106 03/14/2018   CO2 30  03/14/2018   GLUCOSE 83 03/14/2018   BUN 12 03/14/2018   CREATININE 0.86 03/14/2018   BILITOT 0.5 03/14/2018   ALKPHOS 44 10/12/2016   AST 15 03/14/2018   ALT 18 03/14/2018   PROT 7.1 03/14/2018   ALBUMIN 4.2 10/12/2016   CALCIUM 9.9 03/14/2018   GFRAA 81 03/14/2018   QFTBGOLDPLUS NEGATIVE 07/07/2017    Speciality Comments: PLQ Eye Exam: 07/12/17 WNL @ [facility]. Follow up 1 year.  Procedures:  No procedures performed Allergies: Codeine; Flagyl [metronidazole hcl]; Nexium [esomeprazole magnesium]; Imuran [azathioprine]; Sulfa antibiotics; and Sulfa drugs cross reactors   Assessment / Plan:     Visit Diagnoses: Dermatomyositis ([place]) - Jo 1+, positive muscle biopsy for dermatomyositis. -Patient has been doing well on current combination of medications.  She has been tapering prednisone by 0.5 mg every month.  She has not noticed any increased muscle weakness or pain.  We will check CK with her next blood work.  Her labs have been stable so far.  Plan: CK  High risk medication use - Current regimen includes methotrexate 0.8 mL's weekly, Plaquenil 200 mg twice daily, folic acid 2 mg daily, prednisone 9 mg daily.  Most recent CBC/CMP within normal limits on 02/03/2018.  Standing orders are in place.  Recommend Pneumovax 23 as indicated.  Recommend yearly dental exams.  Sjogren's syndrome with keratoconjunctivitis sicca ([facility])-over-the-counter products has been helpful.  Primary osteoarthritis of both hands-she is not having much discomfort in her hands currently.  Chronic pain of right knee-recent MRI showed meniscus tear.  She was seen by Dr. Durward Fortes who is recommended cortisone injection.  Patient wants to hold off cortisone shot at this point.  History of vitamin D deficiency-she is on vitamin D supplement.  Osteopenia of multiple sites-her last bone density was 2 years ago which showed osteopenia.  I am concerned that she is on long-term prednisone.  She will be  getting bone density next month.  We may have to put her on bisphosphonates based on the bone density results.  She does not want to take bisphosphonates at this time.  Thymoma-she is followed up at [facility].  Former smoker   Orders: Orders Placed This Encounter  Procedures  . CK   No orders of the defined types were placed in this encounter.  .  Follow-Up Instructions: Return in about 3 months (around 06/14/2018) for Dermatomyositis, Osteoarthritis, Sjogren's.   Bo Merino, MD  Note - This record has been created using Editor, [employer].  Chart creation errors have been sought, but may not always  have been located. Such creation errors do not reflect on  the standard of medical care.

## 2018-03-08 ENCOUNTER — Ambulatory Visit (HOSPITAL_COMMUNITY)
Admission: RE | Admit: 2018-03-08 | Discharge: 2018-03-08 | Disposition: A | Discharge status: Home / Self Care | Payer: Medicare Other | Source: Ambulatory Visit | Attending: Rheumatology | Admitting: Rheumatology

## 2018-03-08 DIAGNOSIS — L718 Other rosacea: Secondary | ICD-10-CM | POA: Diagnosis not present

## 2018-03-08 DIAGNOSIS — Z85828 Personal history of other malignant neoplasm of skin: Secondary | ICD-10-CM | POA: Diagnosis not present

## 2018-03-08 DIAGNOSIS — S83241A Other tear of medial meniscus, current injury, right knee, initial encounter: Secondary | ICD-10-CM | POA: Insufficient documentation

## 2018-03-08 DIAGNOSIS — M6751 Plica syndrome, right knee: Secondary | ICD-10-CM | POA: Insufficient documentation

## 2018-03-08 DIAGNOSIS — M25461 Effusion, right knee: Secondary | ICD-10-CM | POA: Diagnosis not present

## 2018-03-08 DIAGNOSIS — X58XXXA Exposure to other specified factors, initial encounter: Secondary | ICD-10-CM | POA: Insufficient documentation

## 2018-03-08 DIAGNOSIS — M25561 Pain in right knee: Secondary | ICD-10-CM | POA: Insufficient documentation

## 2018-03-08 DIAGNOSIS — M3390 Dermatopolymyositis, unspecified, organ involvement unspecified: Secondary | ICD-10-CM | POA: Diagnosis not present

## 2018-03-09 NOTE — Progress Notes (Signed)
Please notify patient that she has a torn meniscus.  She will need evaluation by an orthopedic surgeon.

## 2018-03-11 ENCOUNTER — Encounter: Payer: Self-pay | Admitting: Rheumatology

## 2018-03-14 ENCOUNTER — Other Ambulatory Visit: Payer: Self-pay

## 2018-03-14 ENCOUNTER — Encounter (INDEPENDENT_AMBULATORY_CARE_PROVIDER_SITE_OTHER): Payer: Self-pay | Admitting: Orthopaedic Surgery

## 2018-03-14 ENCOUNTER — Ambulatory Visit (INDEPENDENT_AMBULATORY_CARE_PROVIDER_SITE_OTHER): Payer: Medicare Other | Admitting: Orthopaedic Surgery

## 2018-03-14 VITALS — BP 121/81 | Ht 66.0 in | Wt 171.0 lb

## 2018-03-14 DIAGNOSIS — G8929 Other chronic pain: Secondary | ICD-10-CM | POA: Diagnosis not present

## 2018-03-14 DIAGNOSIS — Z79899 Other long term (current) drug therapy: Secondary | ICD-10-CM | POA: Diagnosis not present

## 2018-03-14 DIAGNOSIS — M25561 Pain in right knee: Secondary | ICD-10-CM | POA: Diagnosis not present

## 2018-03-14 NOTE — Progress Notes (Signed)
Office Visit Note   Patient: Marisa Orozco           Date of Birth: 1950-09-24           MRN: [id number] Visit Date: 03/14/2018              Requested by: Caryl Bis, MD [place], [place] [address] PCP: Caryl Bis, MD   Assessment & Plan: Visit Diagnoses:  1. Chronic pain of right knee     Plan: Right knee pain is presently minimally symptomatic.  Appears to be a combination of mild arthritis in the medial compartment and a tear of the root of the posterior horn of the medial meniscus.  Long discussion regarding treatment options for the arthritis and the meniscal tearing.  As she is having minimal discomfort at this point will hold off on surgery.  She will check with Dr Estanislado Pandy regarding a cortisone injection.  Plan to see back as needed.  Follow-Up Instructions: Return if symptoms worsen or fail to improve.   Orders:  No orders of the defined types were placed in this encounter.  No orders of the defined types were placed in this encounter.     Procedures: No procedures performed   Clinical Data: No additional findings.   Subjective: Chief Complaint  Patient presents with  . Right Knee - Pain  . Knee Pain    Mid October right knee began to buckle while walking or going up and down steps, states has to walk sideways when using steps. Some swelling, tight when bending knee. Most pain is medial knee inferior patella, sharp stabbing at times. No medications for pain, no ice. Stopped walking for exercise, states made knee worse. Previously seen by Dr. Estanislado Pandy 02/03/18  Has had prior cortisone injection over a year ago that made a big difference with her knee pain.  Only has had a recent exacerbation as mentioned above.  MRI scan demonstrated a combination of issues in the medial compartment of the right knee with a tear of the meniscus and arthritis  HPI  Review of Systems   Objective: Vital Signs: BP 121/81 (BP Location: Left Arm, Patient  Position: Sitting, Cuff Size: Normal)   Ht 5\' 6"  (1.676 m)   Wt 171 lb (77.6 kg)   BMI 27.60 kg/m   Physical Exam  Constitutional: She is oriented to person, place, and time. She appears well-developed and well-nourished.  HENT:  Mouth/Throat: Oropharynx is clear and moist.  Eyes: Pupils are equal, round, and reactive to light. EOM are normal.  Pulmonary/Chest: Effort normal.  Neurological: She is alert and oriented to person, place, and time.  Skin: Skin is warm and dry.  Psychiatric: She has a normal mood and affect. Her behavior is normal.    Ortho Exam awake alert and oriented x3.  Comfortable sitting.  Examination of the right knee was relatively benign.  No effusion.  No instability.  No significant medial or lateral joint pain.  No patellar crepitation.  No calf discomfort.  No distal edema.  Straight leg raise negative.  Painless range of motion right hip  Specialty Comments:  No specialty comments available.  Imaging: No results found.   PMFS History: Patient Active Problem List   Diagnosis Date Noted  . Primary osteoarthritis of right knee 02/03/2018  . Primary osteoarthritis of both hands 09/08/2017  . High risk medication use 04/07/2016  . Sjogren's disease ([place]) 02/04/2016  . Dermatomyositis ([place]) 02/04/2016  . Former  smoker 02/04/2016  . 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 Fatigue 02/15/2012   Past Medical History:  Diagnosis Date  . Allergy   . Cancer ([place])    basal cell carcinoma - face  . Dermatomyositis ([place]) 02/04/2016   Jo 1 Positive  Skin Bx- Subtle interface Dermatitis   . Former smoker 02/04/2016  . GERD (gastroesophageal reflux disease)    otc med prn  . PONV (postoperative nausea and vomiting)   . Sjogren's disease ([place]) 02/04/2016   Positive ANA, Positive Ro, Positive CCP, Negative RF, Parotitis, Positive Sicca  . Sjogren's syndrome ([place])    from MD note    Family History  Problem Relation Age of Onset  . COPD Mother   . Heart disease Mother   .  Diabetes Father   . Kidney disease Father   . Epilepsy Daughter   . Colon cancer Neg Hx   . Esophageal cancer Neg Hx   . Stomach cancer Neg Hx   . Rectal cancer Neg Hx     Past Surgical History:  Procedure Laterality Date  . BREAST SURGERY     augmentation  . BUNIONECTOMY     right foot  . COLONOSCOPY    . DIAGNOSTIC LAPAROSCOPY    . DILATION AND CURETTAGE OF UTERUS  04/15/2011   Procedure: DILATATION AND CURETTAGE;  Surgeon: [name];  Location: [facility];  Service: Gynecology;  Laterality: N/A;  . MUSCLE BIOPSY Left 01/02/2016   Procedure: MUSCLE BIOPSY LEFT DELTOID;  Surgeon: [name], MD;  Location: [facility];  Service: General;  Laterality: Left;  left deltoid muscle bx  . NASAL SEPTUM SURGERY    . SVD     x 2  . TONSILLECTOMY    . WISDOM TOOTH EXTRACTION     Social History   Occupational History  . Not on file  Tobacco Use  . Smoking status: Former Smoker    Packs/day: 1.00    Years: 25.00    Pack years: 25.00    Types: Cigarettes    Last attempt to quit: 08/05/2003    Years since quitting: 14.6  . Smokeless tobacco: Never Used  Substance and Sexual Activity  . Alcohol use: Not Currently    Comment: socially - wine  . Drug use: No  . Sexual activity: Yes    Birth control/protection: None     Garald Balding, MD   Note - This record has been created using [employer].  Chart creation errors have been sought, but may not always  have been located. Such creation errors do not reflect on  the standard of medical care.

## 2018-03-15 ENCOUNTER — Encounter: Payer: Self-pay | Admitting: Rheumatology

## 2018-03-15 ENCOUNTER — Ambulatory Visit (INDEPENDENT_AMBULATORY_CARE_PROVIDER_SITE_OTHER): Payer: Medicare Other | Admitting: Rheumatology

## 2018-03-15 VITALS — BP 134/72 | HR 75 | Resp 13 | Ht 66.5 in | Wt 175.0 lb

## 2018-03-15 DIAGNOSIS — M8589 Other specified disorders of bone density and structure, multiple sites: Secondary | ICD-10-CM

## 2018-03-15 DIAGNOSIS — D4989 Neoplasm of unspecified behavior of other specified sites: Secondary | ICD-10-CM

## 2018-03-15 DIAGNOSIS — M3501 Sicca syndrome with keratoconjunctivitis: Secondary | ICD-10-CM | POA: Diagnosis not present

## 2018-03-15 DIAGNOSIS — M25561 Pain in right knee: Secondary | ICD-10-CM

## 2018-03-15 DIAGNOSIS — G8929 Other chronic pain: Secondary | ICD-10-CM | POA: Diagnosis not present

## 2018-03-15 DIAGNOSIS — Z79899 Other long term (current) drug therapy: Secondary | ICD-10-CM

## 2018-03-15 DIAGNOSIS — M19041 Primary osteoarthritis, right hand: Secondary | ICD-10-CM | POA: Diagnosis not present

## 2018-03-15 DIAGNOSIS — M19042 Primary osteoarthritis, left hand: Secondary | ICD-10-CM

## 2018-03-15 DIAGNOSIS — M3390 Dermatopolymyositis, unspecified, organ involvement unspecified: Secondary | ICD-10-CM | POA: Diagnosis not present

## 2018-03-15 DIAGNOSIS — D150 Benign neoplasm of thymus: Secondary | ICD-10-CM | POA: Diagnosis not present

## 2018-03-15 DIAGNOSIS — Z8639 Personal history of other endocrine, nutritional and metabolic disease: Secondary | ICD-10-CM

## 2018-03-15 DIAGNOSIS — Z87891 Personal history of nicotine dependence: Secondary | ICD-10-CM | POA: Diagnosis not present

## 2018-03-15 LAB — COMPLETE METABOLIC PANEL WITH GFR
AG Ratio: 1.6 (calc) (ref 1.0–2.5)
ALKALINE PHOSPHATASE (APISO): 55 U/L (ref 33–130)
ALT: 18 U/L (ref 6–29)
AST: 15 U/L (ref 10–35)
Albumin: 4.4 g/dL (ref 3.6–5.1)
BUN: 12 mg/dL (ref 7–25)
CO2: 30 mmol/L (ref 20–32)
Calcium: 9.9 mg/dL (ref 8.6–10.4)
Chloride: 106 mmol/L (ref 98–110)
Creat: 0.86 mg/dL (ref 0.50–0.99)
GFR, Est African American: 81 mL/min/{1.73_m2} (ref >=60)
GFR, Est Non African American: 70 mL/min/{1.73_m2} (ref >=60)
Globulin: 2.7 g/dL (calc) (ref 1.9–3.7)
Glucose, Bld: 83 mg/dL (ref 65–99)
Potassium: 4 mmol/L (ref 3.5–5.3)
Sodium: 144 mmol/L (ref 135–146)
Total Bilirubin: 0.5 mg/dL (ref 0.2–1.2)
Total Protein: 7.1 g/dL (ref 6.1–8.1)

## 2018-03-15 LAB — CBC WITH DIFFERENTIAL/PLATELET
Basophils Absolute: 77 cells/uL (ref 0–200)
Basophils Relative: 1 %
Eosinophils Absolute: 231 cells/uL (ref 15–500)
Eosinophils Relative: 3 %
HCT: 46 % — ABNORMAL HIGH (ref 35.0–45.0)
HEMOGLOBIN: 15.4 g/dL (ref 11.7–15.5)
Lymphs Abs: 2287 cells/uL (ref 850–3900)
MCH: 32 pg (ref 27.0–33.0)
MCHC: 33.5 g/dL (ref 32.0–36.0)
MCV: 95.4 fL (ref 80.0–100.0)
MPV: 11.3 fL (ref 7.5–12.5)
Monocytes Relative: 9.2 %
Neutro Abs: 4397 cells/uL (ref 1500–7800)
Neutrophils Relative %: 57.1 %
Platelets: 238 10*3/uL (ref 140–400)
RBC: 4.82 10*6/uL (ref 3.80–5.10)
RDW: 13.1 % (ref 11.0–15.0)
Total Lymphocyte: 29.7 %
WBC mixed population: 708 cells/uL (ref 200–950)
WBC: 7.7 10*3/uL (ref 3.8–10.8)

## 2018-03-22 DIAGNOSIS — R7301 Impaired fasting glucose: Secondary | ICD-10-CM | POA: Diagnosis not present

## 2018-03-22 DIAGNOSIS — M1990 Unspecified osteoarthritis, unspecified site: Secondary | ICD-10-CM | POA: Diagnosis not present

## 2018-03-22 DIAGNOSIS — K210 Gastro-esophageal reflux disease with esophagitis: Secondary | ICD-10-CM | POA: Diagnosis not present

## 2018-03-22 DIAGNOSIS — E119 Type 2 diabetes mellitus without complications: Secondary | ICD-10-CM | POA: Diagnosis not present

## 2018-03-22 DIAGNOSIS — Z9189 Other specified personal risk factors, not elsewhere classified: Secondary | ICD-10-CM | POA: Diagnosis not present

## 2018-03-22 DIAGNOSIS — E8881 Metabolic syndrome: Secondary | ICD-10-CM | POA: Diagnosis not present

## 2018-03-22 DIAGNOSIS — E039 Hypothyroidism, unspecified: Secondary | ICD-10-CM | POA: Diagnosis not present

## 2018-03-22 DIAGNOSIS — E782 Mixed hyperlipidemia: Secondary | ICD-10-CM | POA: Diagnosis not present

## 2018-03-25 ENCOUNTER — Ambulatory Visit (AMBULATORY_SURGERY_CENTER): Payer: Self-pay

## 2018-03-25 VITALS — Ht 66.5 in | Wt 176.4 lb

## 2018-03-25 DIAGNOSIS — Z23 Encounter for immunization: Secondary | ICD-10-CM | POA: Diagnosis not present

## 2018-03-25 DIAGNOSIS — Z86010 Personal history of colonic polyps: Secondary | ICD-10-CM

## 2018-03-25 DIAGNOSIS — Z0001 Encounter for general adult medical examination with abnormal findings: Secondary | ICD-10-CM | POA: Diagnosis not present

## 2018-03-25 DIAGNOSIS — Z6827 Body mass index (BMI) 27.0-27.9, adult: Secondary | ICD-10-CM | POA: Diagnosis not present

## 2018-03-25 LAB — TSH
TSH: 4.18 (ref 0.41–5.90)
TSH: 4.89 (ref 0.41–5.90)

## 2018-03-25 MED ORDER — NA SULFATE-K SULFATE-MG SULF 17.5-3.13-1.6 GM/177ML PO SOLN: 1.0000 | Freq: Once | ORAL | 0 refills | Status: AC

## 2018-03-25 NOTE — Progress Notes (Signed)
Denies allergies to eggs or soy products. Denies complication of anesthesia or sedation. Denies use of weight loss medication. Denies use of O2.   Emmi instructions declined.  

## 2018-03-31 ENCOUNTER — Encounter: Payer: Self-pay | Admitting: Rheumatology

## 2018-03-31 MED ORDER — FOLIC ACID 1 MG PO TABS: 2.0000 mg | ORAL_TABLET | Freq: Every day | ORAL | 3 refills | Status: DC

## 2018-03-31 NOTE — Telephone Encounter (Signed)
Last Visit: 03/15/18 Next Visit: 06/17/18  Okay to refill per Dr. Estanislado Pandy

## 2018-04-07 ENCOUNTER — Ambulatory Visit (INDEPENDENT_AMBULATORY_CARE_PROVIDER_SITE_OTHER): Payer: Medicare Other | Admitting: Orthopaedic Surgery

## 2018-04-08 ENCOUNTER — Encounter: Payer: Self-pay | Admitting: Gastroenterology

## 2018-04-08 ENCOUNTER — Ambulatory Visit (AMBULATORY_SURGERY_CENTER): Payer: Medicare Other | Admitting: Gastroenterology

## 2018-04-08 VITALS — BP 117/56 | HR 60 | Temp 97.3°F | Resp 29 | Ht 66.5 in | Wt 175.0 lb

## 2018-04-08 DIAGNOSIS — D123 Benign neoplasm of transverse colon: Secondary | ICD-10-CM

## 2018-04-08 DIAGNOSIS — Z86010 Personal history of colonic polyps: Secondary | ICD-10-CM | POA: Diagnosis not present

## 2018-04-08 DIAGNOSIS — K219 Gastro-esophageal reflux disease without esophagitis: Secondary | ICD-10-CM | POA: Diagnosis not present

## 2018-04-08 DIAGNOSIS — F419 Anxiety disorder, unspecified: Secondary | ICD-10-CM | POA: Diagnosis not present

## 2018-04-08 DIAGNOSIS — Z1211 Encounter for screening for malignant neoplasm of colon: Secondary | ICD-10-CM | POA: Diagnosis not present

## 2018-04-08 MED ORDER — SODIUM CHLORIDE 0.9 % IV SOLN: 500.0000 mL | Freq: Once | INTRAVENOUS | Status: DC

## 2018-04-08 NOTE — Op Note (Signed)
[facility] Patient Name: Marisa Orozco Procedure Date: 04/08/2018 10:54 AM MRN: [id number] Endoscopist: Ladene Artist , MD Age: 68 Referring MD:  Date of Birth: Apr 26, 1950 Gender: Female Account #: [id number] Procedure:                Colonoscopy Indications:              Surveillance: Personal history of adenomatous                            polyps on last colonoscopy 5 years ago Medicines:                Monitored Anesthesia Care Procedure:                Pre-Anesthesia Assessment:                           - Prior to the procedure, a History and Physical                            was performed, and patient medications and                            allergies were reviewed. The patient's tolerance of                            previous anesthesia was also reviewed. The risks                            and benefits of the procedure and the sedation                            options and risks were discussed with the patient.                            All questions were answered, and informed consent                            was obtained. Prior Anticoagulants: The patient has                            taken no previous anticoagulant or antiplatelet                            agents. ASA Grade Assessment: II - A patient with                            mild systemic disease. After reviewing the risks                            and benefits, the patient was deemed in                            satisfactory condition to undergo the procedure.  After obtaining informed consent, the colonoscope                            was passed under direct vision. Throughout the                            procedure, the patient's blood pressure, pulse, and                            oxygen saturations were monitored continuously. The                            Colonoscope was introduced through the anus and                            advanced to the the cecum,  identified by                            appendiceal orifice and ileocecal valve. The                            ileocecal valve, appendiceal orifice, and rectum                            were photographed. The quality of the bowel                            preparation was good. The colonoscopy was performed                            without difficulty. The patient tolerated the                            procedure well. Scope In: 11:06:20 AM Scope Out: 11:20:56 AM Scope Withdrawal Time: 0 hours 11 minutes 17 seconds  Total Procedure Duration: 0 hours 14 minutes 36 seconds  Findings:                 The perianal and digital rectal examinations were                            normal.                           A 8 mm polyp was found in the transverse colon. The                            polyp was sessile. The polyp was removed with a                            cold snare. Resection and retrieval were complete.                           A few small-mouthed diverticula were found in the  sigmoid colon. There was no evidence of                            diverticular bleeding.                           The exam was otherwise without abnormality on                            direct and retroflexion views. Complications:            No immediate complications. Estimated blood loss:                            None. Estimated Blood Loss:     Estimated blood loss: none. Impression:               - One 8 mm polyp in the transverse colon, removed                            with a cold snare. Resected and retrieved.                           - Mild diverticulosis in the sigmoid colon.                           - The examination was otherwise normal on direct                            and retroflexion views. Recommendation:           - Repeat colonoscopy in 5 years for surveillance.                           - Patient has a contact number available for                             emergencies. The signs and symptoms of potential                            delayed complications were discussed with the                            patient. Return to normal activities tomorrow.                            Written discharge instructions were provided to the                            patient.                           - Resume previous diet.                           - Continue present medications.                           -  Await pathology results. Ladene Artist, MD 04/08/2018 11:26:02 AM This report has been signed electronically.

## 2018-04-08 NOTE — Patient Instructions (Signed)
Information on polyps and diverticulosis given to you today.  Await pathology results.  YOU HAD AN ENDOSCOPIC PROCEDURE TODAY AT [facility]:   Refer to the procedure report that was given to you for any specific questions about what was found during the examination.  If the procedure report does not answer your questions, please call your gastroenterologist to clarify.  If you requested that your care partner not be given the details of your procedure findings, then the procedure report has been included in a sealed envelope for you to review at your convenience later.  YOU SHOULD EXPECT: Some feelings of bloating in the abdomen. Passage of more gas than usual.  Walking can help get rid of the air that was put into your GI tract during the procedure and reduce the bloating. If you had a lower endoscopy (such as a colonoscopy or flexible sigmoidoscopy) you may notice spotting of blood in your stool or on the toilet paper. If you underwent a bowel prep for your procedure, you may not have a normal bowel movement for a few days.  Please Note:  You might notice some irritation and congestion in your nose or some drainage.  This is from the oxygen used during your procedure.  There is no need for concern and it should clear up in a day or so.  SYMPTOMS TO REPORT IMMEDIATELY:   Following lower endoscopy (colonoscopy or flexible sigmoidoscopy):  Excessive amounts of blood in the stool  Significant tenderness or worsening of abdominal pains  Swelling of the abdomen that is new, acute  Fever of 100F or higher  For urgent or emergent issues, a gastroenterologist can be reached at any hour by calling [phone].   DIET:  We do recommend a small meal at first, but then you may proceed to your regular diet.  Drink plenty of fluids but you should avoid alcoholic beverages for 24 hours.  ACTIVITY:  You should plan to take it easy for the rest of today and you should NOT DRIVE or use  heavy machinery until tomorrow (because of the sedation medicines used during the test).    FOLLOW UP: Our staff will call the number listed on your records the next business day following your procedure to check on you and address any questions or concerns that you may have regarding the information given to you following your procedure. If we do not reach you, we will leave a message.  However, if you are feeling well and you are not experiencing any problems, there is no need to return our call.  We will assume that you have returned to your regular daily activities without incident.  If any biopsies were taken you will be contacted by phone or by letter within the next 1-3 weeks.  Please call us at [phone] if you have not heard about the biopsies in 3 weeks.    SIGNATURES/CONFIDENTIALITY: You and/or your care partner have signed paperwork which will be entered into your electronic medical record.  These signatures attest to the fact that that the information above on your After Visit Summary has been reviewed and is understood.  Full responsibility of the confidentiality of this discharge information lies with you and/or your care-partner.

## 2018-04-08 NOTE — Progress Notes (Signed)
PT taken to PACU. Monitors in place. VSS. Report given to RN. 

## 2018-04-08 NOTE — Progress Notes (Signed)
Called to room to assist during endoscopic procedure.  Patient ID and intended procedure confirmed with present staff. Received instructions for my participation in the procedure from the performing physician.  

## 2018-04-11 ENCOUNTER — Telehealth: Payer: Self-pay

## 2018-04-11 NOTE — Telephone Encounter (Signed)
Left message on f/u call 

## 2018-04-11 NOTE — Telephone Encounter (Signed)
  Follow up Call-  Call back number 04/08/2018  Post procedure Call Back phone  # [phone]  Permission to leave phone message Yes  Some recent data might be hidden     Patient questions:  Do you have a fever, pain , or abdominal swelling? No Pain Score  0 Have you tolerated food without any problems? Yes  Have you been able to return to your normal activities? Yes  Do you have any questions about your discharge instructions: Diet   No Medications  No Follow up visit  No  Do you have questions or concerns about your Care? No  Actions: * If pain score is 4 or above: No action needed, pain <4

## 2018-04-14 ENCOUNTER — Encounter: Payer: Self-pay | Admitting: Gastroenterology

## 2018-04-14 ENCOUNTER — Encounter: Payer: Self-pay | Admitting: Rheumatology

## 2018-04-14 MED ORDER — METHOTREXATE SODIUM CHEMO INJECTION 50 MG/2ML: INTRAMUSCULAR | 0 refills | Status: DC

## 2018-04-14 NOTE — Telephone Encounter (Signed)
Last Visit: 03/15/18 Next visit: 06/17/18 Labs: 03/14/18 WNL  Okay to refill per Dr. Estanislado Pandy

## 2018-04-16 ENCOUNTER — Other Ambulatory Visit: Payer: Self-pay | Admitting: Rheumatology

## 2018-04-17 ENCOUNTER — Other Ambulatory Visit: Payer: Self-pay | Admitting: Rheumatology

## 2018-04-18 NOTE — Telephone Encounter (Signed)
Last Visit: 03/15/18 Next Visit: 06/17/18  Okay to refill per Dr. Estanislado Pandy

## 2018-04-21 ENCOUNTER — Encounter: Payer: Self-pay | Admitting: Rheumatology

## 2018-04-21 ENCOUNTER — Telehealth: Payer: Self-pay | Admitting: Pharmacy Technician

## 2018-04-21 NOTE — Telephone Encounter (Signed)
Received a Prior Authorization request from [facility] for methotrexate vials. Authorization has been submitted to patient's insurance via Cover My Meds. Will update once we receive a response.

## 2018-04-22 ENCOUNTER — Encounter: Payer: Self-pay | Admitting: *Deleted

## 2018-04-25 NOTE — Telephone Encounter (Signed)
Prior Authorization approved 04/19/18- until further notice.

## 2018-05-07 ENCOUNTER — Other Ambulatory Visit: Payer: Self-pay | Admitting: Rheumatology

## 2018-05-07 DIAGNOSIS — M3501 Sicca syndrome with keratoconjunctivitis: Secondary | ICD-10-CM

## 2018-05-09 MED ORDER — HYDROXYCHLOROQUINE SULFATE 200 MG PO TABS: 200.0000 mg | ORAL_TABLET | Freq: Two times a day (BID) | ORAL | 0 refills | Status: DC

## 2018-05-09 NOTE — Telephone Encounter (Signed)
Last Visit: 03/15/18 Next Visit: 06/17/18 Labs: 03/14/18 WNL PLQ Eye Exam 07/12/17 WNL  Okay to refill per Dr. Estanislado Pandy

## 2018-05-16 ENCOUNTER — Other Ambulatory Visit: Payer: Self-pay | Admitting: Rheumatology

## 2018-05-16 NOTE — Telephone Encounter (Signed)
Last Visit: 03/15/18 Next visit: 06/17/18  Okay to refill per Dr. Estanislado Pandy

## 2018-06-02 DIAGNOSIS — M8588 Other specified disorders of bone density and structure, other site: Secondary | ICD-10-CM | POA: Diagnosis not present

## 2018-06-02 DIAGNOSIS — M810 Age-related osteoporosis without current pathological fracture: Secondary | ICD-10-CM | POA: Diagnosis not present

## 2018-06-03 NOTE — Progress Notes (Signed)
Office Visit Note  Patient: Marisa Orozco             Date of Birth: [birth year]           MRN: [id number]             PCP: Caryl Bis, MD Referring: Caryl Bis, MD Visit Date: 06/17/2018 Occupation: @GUAROCC @  Subjective:  Medication monitoring.    History of Present Illness: Marisa Orozco is a 68 y.o. female with history of dermatomyositis Sjogren's and osteoarthritis.  She denies any increased muscle weakness or tenderness.  She is reduced her prednisone to 7.5 mg p.o. daily now.  Is been reducing her prednisone by 0.5 mg every month.  She has been taking Plaquenil 200 mg p.o. twice daily and methotrexate 0.8 mL subcutaneously.  She continues to have sicca symptoms.  She states she had recent bone density.  She cannot take oral bisphosphonates as she has hiatal hernia and reflux.  She is not convinced to have IV Reclast at this point.  Activities of Daily Living:  Patient reports morning stiffness for 0 minute.   Patient Denies nocturnal pain.  Difficulty dressing/grooming: Denies Difficulty climbing stairs: Denies Difficulty getting out of chair: Denies Difficulty using hands for taps, buttons, cutlery, and/or writing: Denies  Review of Systems  Constitutional: Positive for fatigue. Negative for night sweats, weight gain and weight loss.       Related to MTX  HENT: Positive for mouth dryness. Negative for mouth sores, trouble swallowing, trouble swallowing and nose dryness.   Eyes: Positive for dryness. Negative for pain, redness and visual disturbance.  Respiratory: Negative for cough, shortness of breath and difficulty breathing.   Cardiovascular: Negative for chest pain, palpitations, hypertension, irregular heartbeat and swelling in legs/feet.  Gastrointestinal: Negative for blood in stool, constipation and diarrhea.  Endocrine: Negative for increased urination.  Genitourinary: Negative for vaginal dryness.  Musculoskeletal: Negative for arthralgias, joint  pain, joint swelling, myalgias, muscle weakness, morning stiffness, muscle tenderness and myalgias.  Skin: Positive for color change and hair loss. Negative for rash, skin tightness, ulcers and sensitivity to sunlight.  Allergic/Immunologic: Negative for susceptible to infections.  Neurological: Negative for dizziness, memory loss, night sweats and weakness.  Hematological: Negative for swollen glands.  Psychiatric/Behavioral: Positive for sleep disturbance. Negative for depressed mood. The patient is not nervous/anxious.     PMFS History:  Patient Active Problem List   Diagnosis Date Noted  . Primary osteoarthritis of right knee 02/03/2018  . Primary osteoarthritis of both hands 09/08/2017  . High risk medication use 04/07/2016  . Sjogren's disease ([place]) 02/04/2016  . Dermatomyositis ([place]) 02/04/2016  . Former smoker 02/04/2016  . 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 Fatigue 02/15/2012    Past Medical History:  Diagnosis Date  . Allergy   . Anxiety   . Cancer ([place])    basal cell carcinoma - face  . Dermatomyositis ([place]) 02/04/2016   Jo 1 Positive  Skin Bx- Subtle interface Dermatitis   . Former smoker 02/04/2016  . GERD (gastroesophageal reflux disease)    otc med prn  . PONV (postoperative nausea and vomiting)   . Sjogren's disease ([place]) 02/04/2016   Positive ANA, Positive Ro, Positive CCP, Negative RF, Parotitis, Positive Sicca  . Sjogren's syndrome ([place])    from MD note    Family History  Problem Relation Age of Onset  . COPD Mother   . Heart disease Mother   . Diabetes Father   . Kidney disease Father   .  Epilepsy Daughter   . Rectal cancer Maternal Uncle   . Esophageal cancer Neg Hx   . Stomach cancer Neg Hx   . Ulcerative colitis Neg Hx    Past Surgical History:  Procedure Laterality Date  . BREAST SURGERY     augmentation  . BUNIONECTOMY     right foot  . COLONOSCOPY    . DIAGNOSTIC LAPAROSCOPY    . DILATION AND CURETTAGE OF UTERUS  04/15/2011   Procedure: DILATATION  AND CURETTAGE;  Surgeon: [name];  Location: [facility];  Service: Gynecology;  Laterality: N/A;  . MUSCLE BIOPSY Left 01/02/2016   Procedure: MUSCLE BIOPSY LEFT DELTOID;  Surgeon: [name], MD;  Location: [facility];  Service: General;  Laterality: Left;  left deltoid muscle bx  . NASAL SEPTUM SURGERY    . SVD     x 2  . TONSILLECTOMY    . WISDOM TOOTH EXTRACTION     Social History   Social History Narrative  . Not on file   Immunization History  Administered Date(s) Administered  . Influenza, High Dose Seasonal PF 01/07/2018  . Influenza-Unspecified 01/04/2014  . Pneumococcal Conjugate-13 01/05/2016  . Zoster Recombinat (Shingrix) 08/11/2017, 12/17/2017     Objective: Vital Signs: BP 123/74 (BP Location: Left Arm, Patient Position: Sitting, Cuff Size: Normal)   Pulse 75   Resp 12   Ht 5\' 6"  (1.676 m)   Wt 177 lb (80.3 kg)   BMI 28.57 kg/m    Physical Exam Vitals signs and nursing note reviewed.  Constitutional:      Appearance: She is well-developed.  HENT:     Head: Normocephalic and atraumatic.  Eyes:     Conjunctiva/sclera: Conjunctivae normal.  Neck:     Musculoskeletal: Normal range of motion.  Cardiovascular:     Rate and Rhythm: Normal rate and regular rhythm.     Heart sounds: Normal heart sounds.  Pulmonary:     Effort: Pulmonary effort is normal.     Breath sounds: Normal breath sounds.  Abdominal:     General: Bowel sounds are normal.     Palpations: Abdomen is soft.  Lymphadenopathy:     Cervical: No cervical adenopathy.  Skin:    General: Skin is warm and dry.     Capillary Refill: Capillary refill takes less than 2 seconds.  Neurological:     Mental Status: She is alert and oriented to person, place, and time.  Psychiatric:        Behavior: Behavior normal.      Musculoskeletal Exam: C-spine thoracic and lumbar spine good range of motion.  Shoulder joints elbow joints wrist joint MCPs PIPs DIPs with good range of  motion with no synovitis.  Hip joints knee joints ankles MTPs PIPs DIPs been good range of motion with no synovitis.  Good muscle strength in all 4 extremities.  CDAI Exam: CDAI Score: Not documented Patient Global Assessment: Not documented; Provider Global Assessment: Not documented Swollen: Not documented; Tender: Not documented Joint Exam   Not documented   There is currently no information documented on the homunculus. Go to the Rheumatology activity and complete the homunculus joint exam.  Investigation: Findings:  DXA June 02, 2018 T score -2.7 lumbar, BMD 0.845 decrease -15.2%, left femoral neck T score -1.4, BMD 0.840, decrease -8.5%, right femoral neck T score -1.1 BMD 0.881, decreased -8.2%, right forearm radius -0.3, BMD 0.695, no comparison   Imaging: No results found.  Recent Labs: Lab Results  Component Value Date   WBC 6.6 06/15/2018   HGB 15.4 06/15/2018   PLT 217 06/15/2018   NA 144 03/14/2018   K 4.0 03/14/2018   CL 106 03/14/2018   CO2 30 03/14/2018   GLUCOSE 83 03/14/2018   BUN 12 03/14/2018   CREATININE 0.86 03/14/2018   BILITOT 0.5 03/14/2018   ALKPHOS 44 10/12/2016   AST 15 03/14/2018   ALT 18 03/14/2018   PROT 7.1 03/14/2018   ALBUMIN 4.2 10/12/2016   CALCIUM 9.9 03/14/2018   GFRAA 81 03/14/2018   QFTBGOLDPLUS NEGATIVE 07/07/2017    Speciality Comments: PLQ Eye Exam: 07/12/17 WNL @ [facility]. Follow up 1 year.  Procedures:  No procedures performed Allergies: Codeine; Flagyl [metronidazole hcl]; Nexium [esomeprazole magnesium]; Imuran [azathioprine]; Sulfa antibiotics; and Sulfa drugs cross reactors   Assessment / Plan:     Visit Diagnoses: Dermatomyositis ([place]) - Jo 1+, positive muscle biopsy for dermatomyositis.  Patient is on methotrexate and Plaquenil combination.  She has had no recurrence of rash.  She has no muscle weakness.  She has been decreasing her prednisone by 0.5 mg every month.  She requests having CMP and CK  every month.  We will do CBC every 3 months.  High risk medication use - PLQ 200 mg po bid, MTX 0.8 ml sq q wk, folic acid 2 mg po qd, prednisone7.5 mg po qd. eye exam: 07/12/2017.  Her labs have been stable.  Hair loss-patient believes that hair loss is related to methotrexate.  She wants to increase folic acid to 3 mg a day.  She will add some over-the-counter supplement.  Sjogren's syndrome with keratoconjunctivitis sicca (H[facility])-she continues to have sicca symptoms.  Over-the-counter products were discussed.  Primary osteoarthritis of both hands-joint protection was discussed.  History of vitamin D deficiency-she takes vitamin D supplement.  Age-related osteoporosis without current pathological fracture-her most recent bone density showed deterioration of bone mass and osteoporosis.  We had detailed discussion regarding treatment options.  She cannot take oral bisphosphonates as she has reflux and hiatal hernia.  We discussed the option of IV Reclast.  Indications side effects contraindications were discussed at length.  Patient's wants to discuss Reclast further with her PCP before making a decision.  Thymoma - she is followed up at [facility].  Former smoker   Orders: Orders Placed This Encounter  Procedures  . CK  . COMPLETE METABOLIC PANEL WITH GFR  . CBC with Differential/Platelet   No orders of the defined types were placed in this encounter.   Face-to-face time spent with patient was 35 minutes. Greater than 50% of time was spent in counseling and coordination of care.  Follow-Up Instructions: Return in about 3 months (around 09/17/2018) for DM, , Sjogren's, Osteoarthritis.   Bo Merino, MD  Note - This record has been created using Editor, commissioning.  Chart creation errors have been sought, but may not always  have been located. Such creation errors do not reflect on  the standard of medical care.

## 2018-06-11 ENCOUNTER — Other Ambulatory Visit: Payer: Self-pay | Admitting: Rheumatology

## 2018-06-12 ENCOUNTER — Encounter: Payer: Self-pay | Admitting: Rheumatology

## 2018-06-12 ENCOUNTER — Other Ambulatory Visit: Payer: Self-pay | Admitting: Rheumatology

## 2018-06-13 ENCOUNTER — Other Ambulatory Visit: Payer: Self-pay | Admitting: *Deleted

## 2018-06-13 DIAGNOSIS — Z79899 Other long term (current) drug therapy: Secondary | ICD-10-CM

## 2018-06-13 DIAGNOSIS — M3390 Dermatopolymyositis, unspecified, organ involvement unspecified: Secondary | ICD-10-CM

## 2018-06-13 NOTE — Telephone Encounter (Signed)
Last Visit: 03/15/18 Next visit: 06/17/18  Okay to refill per Dr. Estanislado Pandy

## 2018-06-15 DIAGNOSIS — Z79899 Other long term (current) drug therapy: Secondary | ICD-10-CM | POA: Diagnosis not present

## 2018-06-15 DIAGNOSIS — M3390 Dermatopolymyositis, unspecified, organ involvement unspecified: Secondary | ICD-10-CM | POA: Diagnosis not present

## 2018-06-17 ENCOUNTER — Ambulatory Visit (INDEPENDENT_AMBULATORY_CARE_PROVIDER_SITE_OTHER): Payer: Medicare Other | Admitting: Rheumatology

## 2018-06-17 ENCOUNTER — Encounter: Payer: Self-pay | Admitting: Rheumatology

## 2018-06-17 ENCOUNTER — Other Ambulatory Visit: Payer: Self-pay

## 2018-06-17 VITALS — BP 123/74 | HR 75 | Resp 12 | Ht 66.0 in | Wt 177.0 lb

## 2018-06-17 DIAGNOSIS — L659 Nonscarring hair loss, unspecified: Secondary | ICD-10-CM | POA: Diagnosis not present

## 2018-06-17 DIAGNOSIS — K449 Diaphragmatic hernia without obstruction or gangrene: Secondary | ICD-10-CM

## 2018-06-17 DIAGNOSIS — K219 Gastro-esophageal reflux disease without esophagitis: Secondary | ICD-10-CM

## 2018-06-17 DIAGNOSIS — M810 Age-related osteoporosis without current pathological fracture: Secondary | ICD-10-CM

## 2018-06-17 DIAGNOSIS — Z87891 Personal history of nicotine dependence: Secondary | ICD-10-CM

## 2018-06-17 DIAGNOSIS — Z79899 Other long term (current) drug therapy: Secondary | ICD-10-CM

## 2018-06-17 DIAGNOSIS — M3390 Dermatopolymyositis, unspecified, organ involvement unspecified: Secondary | ICD-10-CM | POA: Diagnosis not present

## 2018-06-17 DIAGNOSIS — M19042 Primary osteoarthritis, left hand: Secondary | ICD-10-CM | POA: Diagnosis not present

## 2018-06-17 DIAGNOSIS — M3313 Other dermatomyositis without myopathy: Secondary | ICD-10-CM

## 2018-06-17 DIAGNOSIS — M19041 Primary osteoarthritis, right hand: Secondary | ICD-10-CM

## 2018-06-17 DIAGNOSIS — D150 Benign neoplasm of thymus: Secondary | ICD-10-CM | POA: Diagnosis not present

## 2018-06-17 DIAGNOSIS — M3501 Sicca syndrome with keratoconjunctivitis: Secondary | ICD-10-CM

## 2018-06-17 DIAGNOSIS — D4989 Neoplasm of unspecified behavior of other specified sites: Secondary | ICD-10-CM

## 2018-06-17 DIAGNOSIS — Z8639 Personal history of other endocrine, nutritional and metabolic disease: Secondary | ICD-10-CM | POA: Diagnosis not present

## 2018-06-17 LAB — CBC WITH DIFFERENTIAL/PLATELET
Absolute Monocytes: 422 cells/uL (ref 200–950)
Basophils Absolute: 53 cells/uL (ref 0–200)
Basophils Relative: 0.8 %
Eosinophils Absolute: 178 cells/uL (ref 15–500)
Eosinophils Relative: 2.7 %
HEMATOCRIT: 45.5 % — AB (ref 35.0–45.0)
HEMOGLOBIN: 15.4 g/dL (ref 11.7–15.5)
Lymphs Abs: 1861 cells/uL (ref 850–3900)
MCH: 31.7 pg (ref 27.0–33.0)
MCHC: 33.8 g/dL (ref 32.0–36.0)
MCV: 93.6 fL (ref 80.0–100.0)
MPV: 10.8 fL (ref 7.5–12.5)
Monocytes Relative: 6.4 %
NEUTROS ABS: 4085 {cells}/uL (ref 1500–7800)
Neutrophils Relative %: 61.9 %
Platelets: 217 10*3/uL (ref 140–400)
RBC: 4.86 10*6/uL (ref 3.80–5.10)
RDW: 12.9 % (ref 11.0–15.0)
Total Lymphocyte: 28.2 %
WBC: 6.6 10*3/uL (ref 3.8–10.8)

## 2018-06-17 LAB — COMPLETE METABOLIC PANEL WITH GFR
AG Ratio: 1.8 (calc) (ref 1.0–2.5)
ALBUMIN MSPROF: 4.7 g/dL (ref 3.6–5.1)
ALKALINE PHOSPHATASE (APISO): 52 U/L (ref 37–153)
ALT: 33 U/L — ABNORMAL HIGH (ref 6–29)
AST: 30 U/L (ref 10–35)
BUN: 12 mg/dL (ref 7–25)
CO2: 23 mmol/L (ref 20–32)
Calcium: 10 mg/dL (ref 8.6–10.4)
Chloride: 106 mmol/L (ref 98–110)
Creat: 0.88 mg/dL (ref 0.50–0.99)
GFR, Est African American: 79 mL/min/{1.73_m2} (ref >=60)
GFR, Est Non African American: 68 mL/min/{1.73_m2} (ref >=60)
GLOBULIN: 2.6 g/dL (ref 1.9–3.7)
Glucose, Bld: 86 mg/dL (ref 65–99)
Potassium: 4 mmol/L (ref 3.5–5.3)
SODIUM: 147 mmol/L — AB (ref 135–146)
Total Bilirubin: 0.7 mg/dL (ref 0.2–1.2)
Total Protein: 7.3 g/dL (ref 6.1–8.1)

## 2018-06-17 LAB — CK: Total CK: 42 U/L (ref 29–143)

## 2018-06-17 LAB — TEST AUTHORIZATION

## 2018-06-17 NOTE — Progress Notes (Signed)
Pharmacy Note  Subjective: Patient presents today to the [facility] to see Dr. Estanislado Pandy.  Patient seen by pharmacist for counseling on IV bisphosphonate therapy for osteoporosis.  She cannot take oral bisphosphonates as she has reflux and hiatal hernia.    Objective: T-score: -2.7 Calcium: 9.9 mg/dL 03/14/2018 Vitamin D: 42 12/07/17  CMP     Component Value Date/Time   NA 144 03/14/2018 0847   NA 137 01/16/2016   K 4.0 03/14/2018 0847   CL 106 03/14/2018 0847   CO2 30 03/14/2018 0847   GLUCOSE 83 03/14/2018 0847   BUN 12 03/14/2018 0847   BUN 11 01/16/2016   CREATININE 0.86 03/14/2018 0847   CALCIUM 9.9 03/14/2018 0847   PROT 7.1 03/14/2018 0847   ALBUMIN 4.2 10/12/2016 1202   AST 15 03/14/2018 0847   ALT 18 03/14/2018 0847   ALKPHOS 44 10/12/2016 1202   BILITOT 0.5 03/14/2018 0847   GFRNONAA 70 03/14/2018 0847   GFRAA 81 03/14/2018 0847   Assessment/Plan:  Counseled patient that Reclast is an IV bisphosphonate that reduces bone turnover by inhibiting osteoclasts that chew up bone.  Counseled patient on purpose, proper use.  Reviewed importance of taking calcium and vitamin D with bisphosphonate therapy. She has a history of vitamin D deficiency and currently takes vitamin D3 spray of 5000 IU daily.  Reviewed adverse events of Reclast including risk  low blood pressure, dizziness, fatigue, headaches, muscle pain, weakness, GI symptoms, fever, and/or rash.  These side effects typically last 1 to 2 days after your infusion. Reviewed rare adverse effect of osteonecrosis of the jaw and advised patient to alert her dentist that she is on Reclast prior to any major dental work.  Patient confirms she does not have any major dental work scheduled at this time.  Provided patient with medication education material and answered all questions.   Patient has had conversation about Reclast in the past with her PCP.  She would like to speak with her PCP prior to starting.  We  will follow up at her next office visit in June.  All questions encouraged and answered.  Instructed patient to call with any further questions or concerns.  [name], PharmD, [facility] Clinical Pharmacist  06/17/2018 10:52 AM

## 2018-06-20 ENCOUNTER — Encounter: Payer: Self-pay | Admitting: Rheumatology

## 2018-06-20 NOTE — Progress Notes (Signed)
LFTs mildly elevated.  We will continue to monitor.

## 2018-06-21 ENCOUNTER — Telehealth: Payer: Self-pay | Admitting: Rheumatology

## 2018-06-21 MED ORDER — METHOTREXATE SODIUM CHEMO INJECTION 50 MG/2ML: INTRAMUSCULAR | 0 refills | Status: DC

## 2018-06-21 MED ORDER — PREDNISONE 1 MG PO TABS: ORAL_TABLET | ORAL | 0 refills | Status: DC

## 2018-06-21 MED ORDER — PREDNISONE 5 MG PO TABS: ORAL_TABLET | ORAL | 0 refills | Status: DC

## 2018-06-21 NOTE — Telephone Encounter (Signed)
Prescription sent to the pharmacy.

## 2018-06-21 NOTE — Telephone Encounter (Signed)
Patient called requesting to speak with Dr. Estanislado Pandy directly regarding refilling her two medications Prednisone and Methotrexate.  Patient states she spoke with Seth Bake regarding refilling them for a 90 day supply due to the [place] Virus.  Patient states "she made no headway with Seth Bake regarding refilling these prescriptions for 90 days."  Patient states "she does not think this is too much to ask for with the current situation in [place] right now."  Patient states "she is not asking for pain killers just her medication so that she does not run out."

## 2018-06-21 NOTE — Telephone Encounter (Signed)
I spoke with Marisa Orozco.  She has received Plaquenil 90-day supply.  Please send a prescription for methotrexate 90-day supply earlier and also send prescription for prednisone 90-day supply.

## 2018-06-24 ENCOUNTER — Encounter: Payer: Self-pay | Admitting: Physical Therapy

## 2018-06-24 NOTE — Therapy (Signed)
[facility] [address] [place], [place], [address] Phone: [phone]   Fax:  [phone]  Patient Details  Name: Marisa Orozco MRN: [id number] Date of Birth: 01-21-1951 Referring Provider:  No ref. provider found  Encounter Date: 06/24/2018   PHYSICAL THERAPY DISCHARGE SUMMARY  Visits from Start of Care: 22  Current functional level related to goals / functional outcomes: All LTG met, pt independent with land and aquatic based HEP   Remaining deficits: LE weakness   Education / Equipment: HEP  Plan: Patient agrees to discharge.  Patient goals were met. Patient is being discharged due to meeting the stated rehab goals.  ?????    Rico Junker, PT, DPT 06/24/18    2:39 PM    [facility] [address] [place], [place], [address] Phone: [phone]   Fax:  [phone]

## 2018-07-04 ENCOUNTER — Other Ambulatory Visit: Payer: Self-pay | Admitting: Thoracic Surgery (Cardiothoracic Vascular Surgery)

## 2018-07-04 DIAGNOSIS — C3490 Malignant neoplasm of unspecified part of unspecified bronchus or lung: Secondary | ICD-10-CM

## 2018-07-19 ENCOUNTER — Other Ambulatory Visit: Payer: Self-pay | Admitting: Rheumatology

## 2018-07-27 ENCOUNTER — Other Ambulatory Visit: Payer: Self-pay | Admitting: Rheumatology

## 2018-07-27 DIAGNOSIS — M3501 Sicca syndrome with keratoconjunctivitis: Secondary | ICD-10-CM

## 2018-07-27 MED ORDER — HYDROXYCHLOROQUINE SULFATE 200 MG PO TABS: 200.0000 mg | ORAL_TABLET | Freq: Two times a day (BID) | ORAL | 0 refills | Status: DC

## 2018-07-27 NOTE — Telephone Encounter (Signed)
Last Visit: 06/17/18 Next visit: 09/23/18 Labs: 06/15/18 LFTs mildly elevated. We will continue to monitor. PLQ Eye Exam: 07/12/17 WNL   Okay to refill per Dr. Estanislado Pandy

## 2018-09-08 ENCOUNTER — Other Ambulatory Visit: Payer: Medicare Other

## 2018-09-09 DIAGNOSIS — E782 Mixed hyperlipidemia: Secondary | ICD-10-CM | POA: Diagnosis not present

## 2018-09-09 DIAGNOSIS — M1990 Unspecified osteoarthritis, unspecified site: Secondary | ICD-10-CM | POA: Diagnosis not present

## 2018-09-09 DIAGNOSIS — E039 Hypothyroidism, unspecified: Secondary | ICD-10-CM | POA: Diagnosis not present

## 2018-09-09 DIAGNOSIS — E8881 Metabolic syndrome: Secondary | ICD-10-CM | POA: Diagnosis not present

## 2018-09-09 DIAGNOSIS — R030 Elevated blood-pressure reading, without diagnosis of hypertension: Secondary | ICD-10-CM | POA: Diagnosis not present

## 2018-09-09 DIAGNOSIS — K210 Gastro-esophageal reflux disease with esophagitis: Secondary | ICD-10-CM | POA: Diagnosis not present

## 2018-09-09 DIAGNOSIS — E119 Type 2 diabetes mellitus without complications: Secondary | ICD-10-CM | POA: Diagnosis not present

## 2018-09-09 NOTE — Progress Notes (Signed)
Office Visit Note  Patient: Marisa Orozco             Date of Birth: [birth year]           MRN: [id number]             PCP: Caryl Bis, MD Referring: Caryl Bis, MD Visit Date: 09/23/2018 Occupation: @GUAROCC @  Subjective:  Weight gain and hair loss.    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  She states she has been doing well on combination of methotrexate Plaquenil and prednisone.  She has been on prednisone taper and currently taking prednisone 6 tablets p.o. weekly.  She has been tapering it gradually by 1-1/2 mg every month.  She denies any rash or muscle weakness.  Her main concern is weight gain and also hair loss.  She had some recent labs which showed her TSH was normal.  Activities of Daily Living:  Patient reports morning stiffness for 0 minute.   Patient Denies nocturnal pain.  Difficulty dressing/grooming: Denies Difficulty climbing stairs: Denies Difficulty getting out of chair: Denies Difficulty using hands for taps, buttons, cutlery, and/or writing: Denies  Review of Systems  Constitutional: Negative for fatigue, night sweats, weight gain and weight loss.  HENT: Positive for mouth dryness. Negative for mouth sores, trouble swallowing, trouble swallowing and nose dryness.   Eyes: Negative for pain, redness, visual disturbance and dryness.  Respiratory: Negative for cough, shortness of breath and difficulty breathing.   Cardiovascular: Negative for chest pain, palpitations, hypertension, irregular heartbeat and swelling in legs/feet.  Gastrointestinal: Negative for blood in stool, constipation and diarrhea.  Endocrine: Negative for increased urination.  Genitourinary: Negative for vaginal dryness.  Musculoskeletal: Negative for arthralgias, joint pain, joint swelling, myalgias, muscle weakness, morning stiffness, muscle tenderness and myalgias.  Skin: Positive for hair loss. Negative for color change, rash,  skin tightness, ulcers and sensitivity to sunlight.  Allergic/Immunologic: Negative for susceptible to infections.  Neurological: Negative for dizziness, memory loss, night sweats and weakness.  Hematological: Negative for swollen glands.  Psychiatric/Behavioral: Positive for sleep disturbance. Negative for depressed mood. The patient is not nervous/anxious.     PMFS History:  Patient Active Problem List   Diagnosis Date Noted  . Primary osteoarthritis of right knee 02/03/2018  . Primary osteoarthritis of both hands 09/08/2017  . High risk medication use 04/07/2016  . Sjogren's disease ([place]) 02/04/2016  . Dermatomyositis ([place]) 02/04/2016  . Former smoker 02/04/2016  . 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 Fatigue 02/15/2012    Past Medical History:  Diagnosis Date  . Allergy   . Anxiety   . Cancer ([place])    basal cell carcinoma - face  . Dermatomyositis ([place]) 02/04/2016   Jo 1 Positive  Skin Bx- Subtle interface Dermatitis   . Former smoker 02/04/2016  . GERD (gastroesophageal reflux disease)    otc med prn  . PONV (postoperative nausea and vomiting)   . Sjogren's disease ([place]) 02/04/2016   Positive ANA, Positive Ro, Positive CCP, Negative RF, Parotitis, Positive Sicca  . Sjogren's syndrome ([place])    from MD note    Family History  Problem Relation Age of Onset  . COPD Mother   . Heart disease Mother   . Diabetes Father   . Kidney disease Father   . Epilepsy Daughter   . Rectal cancer Maternal Uncle   . Esophageal cancer Neg Hx   . Stomach cancer Neg Hx   . Ulcerative colitis  Neg Hx    Past Surgical History:  Procedure Laterality Date  . BREAST SURGERY     augmentation  . BUNIONECTOMY     right foot  . COLONOSCOPY    . DIAGNOSTIC LAPAROSCOPY    . DILATION AND CURETTAGE OF UTERUS  04/15/2011   Procedure: DILATATION AND CURETTAGE;  Surgeon: [name];  Location: [facility];  Service: Gynecology;  Laterality: N/A;  . MUSCLE BIOPSY Left 01/02/2016   Procedure: MUSCLE BIOPSY LEFT  DELTOID;  Surgeon: [name], MD;  Location: [facility];  Service: General;  Laterality: Left;  left deltoid muscle bx  . NASAL SEPTUM SURGERY    . SVD     x 2  . TONSILLECTOMY    . WISDOM TOOTH EXTRACTION     Social History   Social History Narrative  . Not on file   Immunization History  Administered Date(s) Administered  . Influenza, High Dose Seasonal PF 01/07/2018  . Influenza-Unspecified 01/04/2014  . Pneumococcal Conjugate-13 01/05/2016  . Zoster Recombinat (Shingrix) 08/11/2017, 12/17/2017     Objective: Vital Signs: BP 132/73 (BP Location: Left Arm, Patient Position: Sitting, Cuff Size: Normal)   Pulse 78   Resp 13   Ht 5' 6.5" (1.689 m)   Wt 183 lb 3.2 oz (83.1 kg)   BMI 29.13 kg/m    Physical Exam Vitals signs and nursing note reviewed.  Constitutional:      Appearance: She is well-developed.  HENT:     Head: Normocephalic and atraumatic.  Eyes:     Conjunctiva/sclera: Conjunctivae normal.  Neck:     Musculoskeletal: Normal range of motion.  Cardiovascular:     Rate and Rhythm: Normal rate and regular rhythm.     Heart sounds: Normal heart sounds.  Pulmonary:     Effort: Pulmonary effort is normal.     Breath sounds: Normal breath sounds.  Abdominal:     General: Bowel sounds are normal.     Palpations: Abdomen is soft.  Lymphadenopathy:     Cervical: No cervical adenopathy.  Skin:    General: Skin is warm and dry.     Capillary Refill: Capillary refill takes less than 2 seconds.  Neurological:     Mental Status: She is alert and oriented to person, place, and time.  Psychiatric:        Behavior: Behavior normal.      Musculoskeletal Exam: C-spine thoracic lumbar spine with good range of motion.  Shoulder joints elbow joints wrist joint MCPs PIPs DIPs with good range of motion with no synovitis.  Hip joints knee joints ankles MTPs PIPs been good range of motion with no synovitis.  No muscle weakness was noted.  Patient had no  difficulty getting up from a squatting position.    CDAI Exam: CDAI Score: - Patient Global: -; Provider Global: - Swollen: -; Tender: - Joint Exam   No joint exam has been documented for this visit   There is currently no information documented on the homunculus. Go to the Rheumatology activity and complete the homunculus joint exam.  Investigation: No additional findings.  Imaging: No results found.  Recent Labs: Lab Results  Component Value Date   WBC 6.6 06/15/2018   HGB 15.4 06/15/2018   PLT 217 06/15/2018   NA 147 (H) 06/15/2018   K 4.0 06/15/2018   CL 106 06/15/2018   CO2 23 06/15/2018   GLUCOSE 86 06/15/2018   BUN 12 06/15/2018   CREATININE 0.88 06/15/2018  BILITOT 0.7 06/15/2018   ALKPHOS 44 10/12/2016   AST 30 06/15/2018   ALT 33 (H) 06/15/2018   PROT 7.3 06/15/2018   ALBUMIN 4.2 10/12/2016   CALCIUM 10.0 06/15/2018   GFRAA 79 06/15/2018   QFTBGOLDPLUS NEGATIVE 07/07/2017  [facility] 09, [facility] CBC normal, CMP normal, lipid panel LDL 65, TSH normal  Speciality Comments: PLQ Eye Exam: 07/12/17 WNL @ [facility]. Follow up 1 year.  Procedures:  No procedures performed Allergies: Codeine, Flagyl [metronidazole hcl], Nexium [esomeprazole magnesium], Imuran [azathioprine], Sulfa antibiotics, and Sulfa drugs cross reactors   Assessment / Plan:     Visit Diagnoses: Dermatomyositis ([place]) - Jo 1+, positive muscle biopsy for dermatomyositis. -Patient is clinically doing well.  Her most recent CK was within normal limits.  There is was no muscle weakness or tenderness on examination.  She will continue prednisone taper as discussed.  High risk medication use -  Methotrexate 0.8 mL's every 7 days, folic acid 1 mg 2 tablets daily, Plaquenil 200 mg 1 tablet twice daily, and prednisone daily dose of 6 mg.  Most recent CBC/CMP within normal limits except for mildly elevated LFTs on 06/15/2018.  Due for CBC/CMP today and will monitor  every 3 months.  Standing orders are in place.  She received a high-dose flu vaccine in October and previously Prevnar 13 and Shingrix vaccine.  Recommend Pneumovax 23 as indicated.   Sjogren's syndrome with keratoconjunctivitis sicca ([place]) - Plan: Over-the-counter products were discussed.  Hair loss-we discussed some supplements and Rogaine topical.  Primary osteoarthritis of both hands - Plan: Joint protection discussed.  Primary osteoarthritis of right knee - Plan: She is currently not having much discomfort.  History of vitamin D deficiency - Plan: She takes supplement.  Age-related osteoporosis without current pathological fracture - Plan: She is on Fosamax by her PCP.  Thymoma - Plan: Followed up at [facility].  Former smoker    Orders: No orders of the defined types were placed in this encounter.  No orders of the defined types were placed in this encounter.   Follow-Up Instructions: Return in about 3 months (around 12/24/2018) for Dermatomyositis, Osteoarthritis.   Bo Merino, MD  Note - This record has been created using Editor, commissioning.  Chart creation errors have been sought, but may not always  have been located. Such creation errors do not reflect on  the standard of medical care.

## 2018-09-13 ENCOUNTER — Encounter: Payer: Medicare Other | Admitting: Thoracic Surgery (Cardiothoracic Vascular Surgery)

## 2018-09-16 DIAGNOSIS — Z6828 Body mass index (BMI) 28.0-28.9, adult: Secondary | ICD-10-CM | POA: Diagnosis not present

## 2018-09-16 DIAGNOSIS — R7301 Impaired fasting glucose: Secondary | ICD-10-CM | POA: Diagnosis not present

## 2018-09-16 DIAGNOSIS — K7581 Nonalcoholic steatohepatitis (NASH): Secondary | ICD-10-CM | POA: Diagnosis not present

## 2018-09-16 DIAGNOSIS — I700 Atherosclerosis of aorta: Secondary | ICD-10-CM | POA: Diagnosis not present

## 2018-09-16 DIAGNOSIS — C37 Malignant neoplasm of thymus: Secondary | ICD-10-CM | POA: Diagnosis not present

## 2018-09-16 DIAGNOSIS — M3390 Dermatopolymyositis, unspecified, organ involvement unspecified: Secondary | ICD-10-CM | POA: Diagnosis not present

## 2018-09-16 DIAGNOSIS — E782 Mixed hyperlipidemia: Secondary | ICD-10-CM | POA: Diagnosis not present

## 2018-09-16 DIAGNOSIS — E8881 Metabolic syndrome: Secondary | ICD-10-CM | POA: Diagnosis not present

## 2018-09-21 ENCOUNTER — Other Ambulatory Visit: Payer: Self-pay | Admitting: Rheumatology

## 2018-09-21 ENCOUNTER — Other Ambulatory Visit: Payer: Self-pay

## 2018-09-21 ENCOUNTER — Encounter: Payer: Self-pay | Admitting: Rheumatology

## 2018-09-21 DIAGNOSIS — Z79899 Other long term (current) drug therapy: Secondary | ICD-10-CM

## 2018-09-22 DIAGNOSIS — Z79899 Other long term (current) drug therapy: Secondary | ICD-10-CM | POA: Diagnosis not present

## 2018-09-22 MED ORDER — PREDNISONE 5 MG PO TABS: ORAL_TABLET | ORAL | 0 refills | Status: DC

## 2018-09-22 MED ORDER — PREDNISONE 1 MG PO TABS: ORAL_TABLET | ORAL | 0 refills | Status: DC

## 2018-09-22 NOTE — Telephone Encounter (Signed)
Last Visit: 06/17/18 Next visit: 09/23/18  Per patient she is currently on 6 mg and tapering by 1/2 mg each month  Okay to refill per Dr. Estanislado Pandy

## 2018-09-23 ENCOUNTER — Ambulatory Visit (INDEPENDENT_AMBULATORY_CARE_PROVIDER_SITE_OTHER): Payer: Medicare Other | Admitting: Rheumatology

## 2018-09-23 ENCOUNTER — Encounter: Payer: Self-pay | Admitting: Rheumatology

## 2018-09-23 ENCOUNTER — Other Ambulatory Visit: Payer: Self-pay

## 2018-09-23 VITALS — BP 132/73 | HR 78 | Resp 13 | Ht 66.5 in | Wt 183.2 lb

## 2018-09-23 DIAGNOSIS — L659 Nonscarring hair loss, unspecified: Secondary | ICD-10-CM

## 2018-09-23 DIAGNOSIS — M19042 Primary osteoarthritis, left hand: Secondary | ICD-10-CM | POA: Diagnosis not present

## 2018-09-23 DIAGNOSIS — D150 Benign neoplasm of thymus: Secondary | ICD-10-CM

## 2018-09-23 DIAGNOSIS — M19041 Primary osteoarthritis, right hand: Secondary | ICD-10-CM

## 2018-09-23 DIAGNOSIS — M3501 Sicca syndrome with keratoconjunctivitis: Secondary | ICD-10-CM

## 2018-09-23 DIAGNOSIS — Z87891 Personal history of nicotine dependence: Secondary | ICD-10-CM | POA: Diagnosis not present

## 2018-09-23 DIAGNOSIS — M1711 Unilateral primary osteoarthritis, right knee: Secondary | ICD-10-CM | POA: Diagnosis not present

## 2018-09-23 DIAGNOSIS — M3390 Dermatopolymyositis, unspecified, organ involvement unspecified: Secondary | ICD-10-CM

## 2018-09-23 DIAGNOSIS — Z8639 Personal history of other endocrine, nutritional and metabolic disease: Secondary | ICD-10-CM

## 2018-09-23 DIAGNOSIS — M810 Age-related osteoporosis without current pathological fracture: Secondary | ICD-10-CM | POA: Diagnosis not present

## 2018-09-23 DIAGNOSIS — D4989 Neoplasm of unspecified behavior of other specified sites: Secondary | ICD-10-CM

## 2018-09-23 DIAGNOSIS — Z79899 Other long term (current) drug therapy: Secondary | ICD-10-CM | POA: Diagnosis not present

## 2018-09-23 LAB — CK: Total CK: 39 U/L (ref 29–143)

## 2018-09-23 NOTE — Patient Instructions (Addendum)
Standing Labs We placed an order today for your standing lab work.    Please come back and get your standing labs  and CK in September and every 3 months  We have open lab daily Monday through Thursday from 8:30-12:30 PM and 1:30-4:30 PM and Friday from 8:30-12:30 PM and 1:30 -4:00 PM at the office of Dr. Bo Merino.   You may experience shorter wait times on Monday and Friday afternoons. The office is located at [address] [place], [place], [place] [address] No appointment is necessary.   Labs are drawn by [facility].  You may receive a bill from [facility] for your lab work.  If you wish to have your labs drawn at another location, please call the office 24 hours in advance to send orders.  If you have any questions regarding directions or hours of operation,  please call [phone].   Just as a reminder please drink plenty of water prior to coming for your lab work. Thanks!

## 2018-09-30 ENCOUNTER — Ambulatory Visit
Admission: RE | Admit: 2018-09-30 | Discharge: 2018-09-30 | Disposition: A | Discharge status: Home / Self Care | Payer: Medicare Other | Source: Ambulatory Visit | Attending: Thoracic Surgery (Cardiothoracic Vascular Surgery) | Admitting: Thoracic Surgery (Cardiothoracic Vascular Surgery)

## 2018-09-30 DIAGNOSIS — C3490 Malignant neoplasm of unspecified part of unspecified bronchus or lung: Secondary | ICD-10-CM

## 2018-09-30 DIAGNOSIS — J9859 Other diseases of mediastinum, not elsewhere classified: Secondary | ICD-10-CM | POA: Diagnosis not present

## 2018-10-02 ENCOUNTER — Encounter: Payer: Self-pay | Admitting: Rheumatology

## 2018-10-03 MED ORDER — METHOTREXATE SODIUM CHEMO INJECTION 50 MG/2ML: INTRAMUSCULAR | 0 refills | Status: DC

## 2018-10-03 NOTE — Telephone Encounter (Signed)
Last Visit: 09/23/18 Next Visit: 12/23/18 Labs: 06/15/18 LFTs mildly elevated.   Okay to refill Dr. Estanislado Pandy

## 2018-10-10 DIAGNOSIS — H2513 Age-related nuclear cataract, bilateral: Secondary | ICD-10-CM | POA: Diagnosis not present

## 2018-10-10 DIAGNOSIS — Z79899 Other long term (current) drug therapy: Secondary | ICD-10-CM | POA: Diagnosis not present

## 2018-10-10 DIAGNOSIS — M3390 Dermatopolymyositis, unspecified, organ involvement unspecified: Secondary | ICD-10-CM | POA: Diagnosis not present

## 2018-10-10 DIAGNOSIS — H40053 Ocular hypertension, bilateral: Secondary | ICD-10-CM | POA: Diagnosis not present

## 2018-10-10 DIAGNOSIS — M3500 Sicca syndrome, unspecified: Secondary | ICD-10-CM | POA: Diagnosis not present

## 2018-10-11 ENCOUNTER — Other Ambulatory Visit: Payer: Self-pay

## 2018-10-11 ENCOUNTER — Ambulatory Visit (INDEPENDENT_AMBULATORY_CARE_PROVIDER_SITE_OTHER): Payer: Medicare Other | Admitting: Thoracic Surgery (Cardiothoracic Vascular Surgery)

## 2018-10-11 VITALS — BP 144/72 | HR 82 | Temp 97.7°F | Resp 20 | Ht 66.5 in | Wt 183.0 lb

## 2018-10-11 DIAGNOSIS — J9859 Other diseases of mediastinum, not elsewhere classified: Secondary | ICD-10-CM | POA: Diagnosis not present

## 2018-10-11 NOTE — Progress Notes (Signed)
[address]       [place] [address]             [phone]     HPI: Marisa Orozco returns for scheduled follow-up visit  Marisa Orozco is a 68 year old woman with a history of Sjogren's syndrome, dermatomyositis, and chronic fatigue.  She also has a remote history of tobacco abuse but quit many years ago.  She was found to have an anterior mediastinal "mass" on the CT in 2017.  There was low-grade activity on PET/CT.  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  She refused surgical resection and so we have followed her radiographically.  Last saw her in May 2019.  She was doing well at that time.  Her autoimmune symptoms were under control with a combination of prednisone and methotrexate.  Past Medical History:  Diagnosis Date  . Allergy   . Anxiety   . Cancer ([place])    basal cell carcinoma - face  . Dermatomyositis ([place]) 02/04/2016   Jo 1 Positive  Skin Bx- Subtle interface Dermatitis   . Former smoker 02/04/2016  . GERD (gastroesophageal reflux disease)    otc med prn  . PONV (postoperative nausea and vomiting)   . Sjogren's disease ([place]) 02/04/2016   Positive ANA, Positive Ro, Positive CCP, Negative RF, Parotitis, Positive Sicca  . Sjogren's syndrome ([place])    from MD note    Current Outpatient Medications  Medication Sig Dispense Refill  . alendronate (FOSAMAX) 70 MG tablet Take 70 mg by mouth once a week. Take with a full glass of water on an empty stomach.    Marland Kitchen BIOTIN PO Take 8 mg by mouth daily.    . Calcium Citrate-Vitamin D (CALCIUM CITRATE+D3 PO) Take by mouth daily.    . Cholecalciferol (VITAMIN D3) 1000 UNIT/SPRAY LIQD Take 5 sprays by mouth daily.     . Coenzyme Q10 (COQ10) 100 MG CAPS Take by mouth every morning.    . folic acid (FOLVITE) 1 MG tablet Take 2 tablets (2 mg total) by mouth daily. (Patient taking differently: Take 3 mg by mouth daily. ) 180 tablet 3  . hydroxychloroquine (PLAQUENIL) 200 MG tablet  Take 1 tablet (200 mg total) by mouth 2 (two) times daily. 180 tablet 0  . methotrexate 50 MG/2ML injection INJECT 0.8ML INTO THE SKIN ONCE A WEEK 10 mL 0  . Multiple Vitamins-Minerals (MULTIVITAMIN ADULT PO) Take 1 tablet by mouth daily.     . predniSONE (DELTASONE) 1 MG tablet TAKE 1 TABLET DAILY WITH 5 MG TABLET, & FOLLOW TAPER AS SCHEDULED 45 tablet 0  . predniSONE (DELTASONE) 5 MG tablet TAKE 1 TABLET BY MOUTH EVERY DAY WITH BREAKFAST 90 tablet 0  . TUBERCULIN SYR 1CC/27GX1/2" 27G X 1/2" 1 ML MISC Patient to use weekly to inject Methotrexate 12 each 3  . TURMERIC PO Take 1 capsule by mouth daily.     Marland Kitchen VITAMIN K PO Take 1 tablet by mouth daily.     . Ginger, Zingiber officinalis, (GINGER ROOT PO) Take 1 tablet by mouth daily.     Marland Kitchen OVER THE COUNTER MEDICATION Burberine, one capsule daily.     No current facility-administered medications for this visit.     Physical Exam BP (!) 144/72   Pulse 82   Temp 97.7 F (36.5 C) (Skin)   Resp 20   Ht 5' 6.5" (1.689 m)   Wt 183 lb (83 kg)  SpO2 95% Comment: RA  BMI 29.38 kg/m  68 year old woman in no acute distress Alert and oriented x3 with no focal deficits Wearing surgical mask No adenopathy Cardiac regular rate and rhythm with a normal S1 and S2 Lungs clear with equal breath sounds bilaterally  Diagnostic Tests: CT CHEST WITHOUT CONTRAST  TECHNIQUE: Multidetector CT imaging of the chest was performed following the standard protocol without IV contrast.  COMPARISON:  Multiple previous chest CTs. The most recent is 08/31/2017  FINDINGS: Cardiovascular: The heart is normal in size. No pericardial effusion. Minimal scattered atherosclerotic calcifications involving the aorta. No definite coronary artery calcifications.  Mediastinum/Nodes: Stable anterior mediastinal soft tissue density measuring a maximum of 2.3 x 1.0 cm. 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  Small scattered mediastinal and hilar lymph nodes  but no adenopathy. The esophagus is normal. Small hiatal hernia is stable.  Lungs/Pleura: Minimal basilar scarring changes. No infiltrates, edema or effusions. No worrisome pulmonary lesions. Stable biapical pleural and parenchymal scarring with calcifications.  Upper Abdomen: No significant upper abdominal findings.  Musculoskeletal: Stable bilateral rim calcified breast prosthesis with persistent probable remote leakage on the right side. No breast masses, supraclavicular or axillary adenopathy. The thyroid gland appears normal.  No significant bony findings.  IMPRESSION: 1. 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 2. No mediastinal lymphadenopathy. 3. No acute pulmonary findings or worrisome pulmonary lesions.  Aortic Atherosclerosis (ICD10-I70.0).   Electronically Signed   By: Marijo Sanes M.D.   On: 09/30/2018 10:23 Personally reviewed the CT images and concur with the findings noted above  Impression: Marisa Orozco is a 68 year old woman with a history of Sjogren's syndrome, dermatomyositis, reflux, chronic fatigue and anxiety.  He was first found to have a anterior mediastinal abnormality on CT of the chest in 2017.  This had the appearance of thymic hyperplasia.  We have followed that with serial CTs for 3 years now and there is been no interval change.  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  We again discussed that thymectomy can sometimes result in improvement of autoimmune disease symptoms.  It is certainly not 675% by any stretch of the imagination, but many people do see improvement.  She is satisfied with her current status on her current medical regimen, and does not want to consider surgical resection.  I do not think there is any reason to continue to follow this radiographically.  Has been stable for 3 years now.  If her symptoms  were to worsen and her medications needed to be increased, we could consider repeating a scan.  Plan: I will be happy to see Marisa Orozco back anytime in the future if I can be of any further assistance with her care  Melrose Nakayama, MD [facility] [phone]

## 2018-10-18 ENCOUNTER — Telehealth: Payer: Self-pay | Admitting: *Deleted

## 2018-10-18 NOTE — Progress Notes (Signed)
Cardiology Office Note    Date:  10/19/2018   ID:  Marisa Orozco, DOB 1951-03-06, MRN [id number]  PCP:  Caryl Bis, MD  Cardiologist:  Dr. Marlou Porch   Chief Complaint: 12 Months follow up  History of Present Illness:   Marisa Orozco is a 68 y.o. female Sjogren's syndrome with myopathy, dermatomyositis, remote tobacco smoking and palpitations seen for follow up.   Hx of palpitations/tachycardia, felt related to inflammatory response to dermatomyositis flare. Echo 12/2015 showed normal ejection fraction of 65-70% with grade 1 diastolic dysfunction.   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  There was low-grade activity on PET/CT.  She has been followed by Dr. Roxan Hockey, last seen 10/11/2018 >> does not want surgical resection again. Advised to follow PRN.   Here today for follow up. The patient denies nausea, vomiting, fever, chest pain, palpitations, shortness of breath, orthopnea, PND, dizziness, syncope, cough, congestion, abdominal pain, hematochezia, melena, lower extremity edema. Keeping social distancing.   Past Medical History:  Diagnosis Date  . Allergy   . Anxiety   . Cancer ([place])    basal cell carcinoma - face  . Dermatomyositis ([place]) 02/04/2016   Jo 1 Positive  Skin Bx- Subtle interface Dermatitis   . Former smoker 02/04/2016  . GERD (gastroesophageal reflux disease)    otc med prn  . PONV (postoperative nausea and vomiting)   . Sjogren's disease ([place]) 02/04/2016   Positive ANA, Positive Ro, Positive CCP, Negative RF, Parotitis, Positive Sicca  . Sjogren's syndrome ([place])    from MD note    Past Surgical History:  Procedure Laterality Date  . BREAST SURGERY     augmentation  . BUNIONECTOMY     right foot  . COLONOSCOPY    . DIAGNOSTIC LAPAROSCOPY    . DILATION AND CURETTAGE OF UTERUS  04/15/2011   Procedure: DILATATION AND CURETTAGE;  Surgeon: [name];  Location: [facility];  Service: Gynecology;  Laterality: N/A;  . MUSCLE  BIOPSY Left 01/02/2016   Procedure: MUSCLE BIOPSY LEFT DELTOID;  Surgeon: [name], MD;  Location: [facility];  Service: General;  Laterality: Left;  left deltoid muscle bx  . NASAL SEPTUM SURGERY    . SVD     x 2  . TONSILLECTOMY    . WISDOM TOOTH EXTRACTION      Current Medications: Prior to Admission medications   Medication Sig Start Date End Date Taking? Authorizing Provider  alendronate (FOSAMAX) 70 MG tablet Take 70 mg by mouth once a week. Take with a full glass of water on an empty stomach.    [provider]  BIOTIN PO Take 8 mg by mouth daily.    [provider]  Calcium Citrate-Vitamin D (CALCIUM CITRATE+D3 PO) Take by mouth daily.    [provider]  Cholecalciferol (VITAMIN D3) 1000 UNIT/SPRAY LIQD Take 5 sprays by mouth daily.     [provider]  Coenzyme Q10 (COQ10) 100 MG CAPS Take by mouth every morning.    [provider]  folic acid (FOLVITE) 1 MG tablet Take 2 tablets (2 mg total) by mouth daily. Patient taking differently: Take 3 mg by mouth daily.  03/31/18   Bo Merino, MD  Ginger, Zingiber officinalis, (GINGER ROOT PO) Take 1 tablet by mouth daily.     [provider]  hydroxychloroquine (PLAQUENIL) 200 MG tablet Take 1 tablet (200 mg total) by mouth 2 (two) times daily. 07/27/18  Bo Merino, MD  methotrexate 50 MG/2ML injection INJECT 0.8ML INTO THE SKIN ONCE A WEEK 10/03/18   Bo Merino, MD  Multiple Vitamins-Minerals (MULTIVITAMIN ADULT PO) Take 1 tablet by mouth daily.     [provider]  OVER THE COUNTER MEDICATION [place], one capsule daily.    [provider]  predniSONE (DELTASONE) 1 MG tablet TAKE 1 TABLET DAILY WITH 5 MG TABLET, & FOLLOW TAPER AS SCHEDULED 09/22/18   Deveshwar, Abel Presto, MD  predniSONE (DELTASONE) 5 MG tablet TAKE 1 TABLET BY MOUTH EVERY DAY WITH BREAKFAST 09/22/18   Bo Merino, MD  TUBERCULIN SYR 1CC/27GX1/2" 27G X 1/2"  1 ML MISC Patient to use weekly to inject Methotrexate 01/17/18   Bo Merino, MD  TURMERIC PO Take 1 capsule by mouth daily.     [provider]  VITAMIN K PO Take 1 tablet by mouth daily.     [provider]    Allergies:   Codeine, Flagyl [metronidazole hcl], Nexium [esomeprazole magnesium], Imuran [azathioprine], Sulfa antibiotics, and Sulfa drugs cross reactors   Social History   Socioeconomic History  . Marital status: Married    Spouse name: Not on file  . Number of children: Not on file  . Years of education: Not on file  . Highest education level: Not on file  Occupational History  . Not on file  Social Needs  . Financial resource strain: Not on file  . Food insecurity    Worry: Not on file    Inability: Not on file  . Transportation needs    Medical: Not on file    Non-medical: Not on file  Tobacco Use  . Smoking status: Former Smoker    Packs/day: 1.00    Years: 25.00    Pack years: 25.00    Types: Cigarettes    Quit date: 08/05/2003    Years since quitting: 15.2  . Smokeless tobacco: Never Used  Substance and Sexual Activity  . Alcohol use: Yes    Comment: socially - wine  . Drug use: No  . Sexual activity: Yes    Birth control/protection: None  Lifestyle  . Physical activity    Days per week: Not on file    Minutes per session: Not on file  . Stress: Not on file  Relationships  . Social Herbalist on phone: Not on file    Gets together: Not on file    Attends religious service: Not on file    Active member of club or organization: Not on file    Attends meetings of clubs or organizations: Not on file    Relationship status: Not on file  Other Topics Concern  . Not on file  Social History Narrative  . Not on file     Family History:  The patient's family history includes COPD in her mother; Diabetes in her father; Epilepsy in her daughter; Heart disease in her mother; Kidney disease in her father; Rectal cancer in  her maternal uncle.   ROS:   Please see the history of present illness.    ROS All other systems reviewed and are negative.   PHYSICAL EXAM:   VS:  BP 118/72   Pulse 71   Ht 5' 6.5" (1.689 m)   Wt 181 lb 6.4 oz (82.3 kg)   SpO2 98%   BMI 28.84 kg/m    GEN: Well nourished, well developed, in no acute distress  HEENT: normal  Neck: no JVD, carotid bruits, or  masses Cardiac: RRR; no murmurs, rubs, or gallops,no edema  Respiratory:  clear to auscultation bilaterally, normal work of breathing GI: soft, nontender, nondistended, + BS MS: no deformity or atrophy  Skin: warm and dry, no rash Neuro:  Alert and Oriented x 3, Strength and sensation are intact Psych: euthymic mood, full affect  Wt Readings from Last 3 Encounters:  10/19/18 181 lb 6.4 oz (82.3 kg)  10/11/18 183 lb (83 kg)  09/23/18 183 lb 3.2 oz (83.1 kg)      Studies/Labs Reviewed:   EKG:  EKG is ordered today.  The ekg ordered today demonstrates SR at rate of 71 bpm  Recent Labs: 06/15/2018: ALT 33; BUN 12; Creat 0.88; Hemoglobin 15.4; Platelets 217; Potassium 4.0; Sodium 147   Lipid Panel No results found for: CHOL, TRIG, HDL, CHOLHDL, VLDL, LDLCALC, LDLDIRECT  Additional studies/ records that were reviewed today include:   Echocardiogram: 12/2015 Study Conclusions  - Left ventricle: The cavity size was normal. There was mild focal   basal hypertrophy of the septum. Systolic function was vigorous.   The estimated ejection fraction was in the range of 65% to 70%.   Wall motion was normal; there were no regional wall motion   abnormalities. Doppler parameters are consistent with abnormal   left ventricular relaxation (grade 1 diastolic dysfunction).   Doppler parameters are consistent with elevated ventricular   end-diastolic filling pressure. - Aortic valve: Trileaflet; normal thickness leaflets.   Transvalvular velocity was within the normal range. There was no   stenosis. There was no regurgitation. -  Aortic root: The aortic root was normal in size. - Ascending aorta: The ascending aorta was normal in size. - Mitral valve: Structurally normal valve. There was no   regurgitation. - Left atrium: The atrium was normal in size. - Right ventricle: The cavity size was normal. Wall thickness was   normal. Systolic function was normal. - Tricuspid valve: There was trivial regurgitation. - Pulmonic valve: There was no regurgitation. - Pulmonary arteries: The main pulmonary artery was normal-sized.   Systolic pressure was within the normal range. - Inferior vena cava: The vessel was normal in size. - Pericardium, extracardiac: There was no pericardial effusion.    ASSESSMENT & PLAN:    1. Palpitations - Felt related to inflammatory response to dermatomyositis flare. No reoccurrence. Not on any regimen.   2. anterior mediastinal "mass" - Stable for 3 years. Declined surgical resection multiple times. Dr. Roxan Hockey recommended not to follow radiographically unless worsen symptoms of needed to change medications     Medication Adjustments/Labs and Tests Ordered: Current medicines are reviewed at length with the patient today.  Concerns regarding medicines are outlined above.  Medication changes, Labs and Tests ordered today are listed in the Patient Instructions below. Patient Instructions  Medication Instructions:  Your physician recommends that you continue on your current medications as directed. Please refer to the Current Medication list given to you today.  If you need a refill on your cardiac medications before your next appointment, please call your pharmacy.   Lab work: None ordered  If you have labs (blood work) drawn today and your tests are completely normal, you will receive your results only by: Marland Kitchen MyChart Message (if you have MyChart) OR . A paper copy in the mail If you have any lab test that is abnormal or we need to change your treatment, we will call you to review  the results.  Testing/Procedures: None ordered  Follow-Up: At [facility], you  and your health needs are our priority.  As part of our continuing mission to provide you with exceptional heart care, we have created designated Provider Care Teams.  These Care Teams include your primary Cardiologist (physician) and Advanced Practice Providers (APPs -  Physician Assistants and Nurse Practitioners) who all work together to provide you with the care you need, when you need it. You will need a follow up appointment in 12 months.  Please call our office 2 months in advance to schedule this appointment.  You may see Dr. Marlou Porch or one of the following Advanced Practice Providers on your designated Care Team:   Truitt Merle, NP Cecilie Kicks, NP . Kathyrn Drown, NP  Any Other Special Instructions Will Be Listed Below (If Applicable).       Jarrett Soho, [place]  10/19/2018 9:31 AM    [facility] [address], [place], [place]  [address] Phone: [phone]; Fax: [phone]

## 2018-10-18 NOTE — Telephone Encounter (Signed)
Pt called off the recall list and scheduled to see Gorden Harms, PA-C 10/19/2018 and has answered no to the following covid19 prescreen questions:       COVID-19 Pre-Screening Questions:  . In the past 7 to 10 days have you had a cough,  shortness of breath, headache, congestion, fever (100 or greater) body aches, chills, sore throat, or sudden loss of taste or sense of smell? . Have you been around anyone with known Covid 19. . Have you been around anyone who is awaiting Covid 19 test results in the past 7 to 10 days? . Have you been around anyone who has been exposed to Covid 19, or has mentioned symptoms of Covid 19 within the past 7 to 10 days?  If you have any concerns/questions about symptoms patients report during screening (either on the phone or at threshold). Contact the provider seeing the patient or DOD for further guidance.  If neither are available contact a member of the leadership team.

## 2018-10-19 ENCOUNTER — Other Ambulatory Visit: Payer: Self-pay

## 2018-10-19 ENCOUNTER — Ambulatory Visit (INDEPENDENT_AMBULATORY_CARE_PROVIDER_SITE_OTHER): Payer: Medicare Other | Admitting: Physician Assistant

## 2018-10-19 ENCOUNTER — Encounter: Payer: Self-pay | Admitting: Physician Assistant

## 2018-10-19 VITALS — BP 118/72 | HR 71 | Ht 66.5 in | Wt 181.4 lb

## 2018-10-19 DIAGNOSIS — R002 Palpitations: Secondary | ICD-10-CM

## 2018-10-19 DIAGNOSIS — M3390 Dermatopolymyositis, unspecified, organ involvement unspecified: Secondary | ICD-10-CM | POA: Diagnosis not present

## 2018-10-19 DIAGNOSIS — M3503 Sicca syndrome with myopathy: Secondary | ICD-10-CM

## 2018-10-19 NOTE — Patient Instructions (Addendum)
Medication Instructions:  Your physician recommends that you continue on your current medications as directed. Please refer to the Current Medication list given to you today.  If you need a refill on your cardiac medications before your next appointment, please call your pharmacy.   Lab work: None ordered  If you have labs (blood work) drawn today and your tests are completely normal, you will receive your results only by: Marland Kitchen MyChart Message (if you have MyChart) OR . A paper copy in the mail If you have any lab test that is abnormal or we need to change your treatment, we will call you to review the results.  Testing/Procedures: None ordered  Follow-Up: At [facility], you and your health needs are our priority.  As part of our continuing mission to provide you with exceptional heart care, we have created designated Provider Care Teams.  These Care Teams include your primary Cardiologist (physician) and Advanced Practice Providers (APPs -  Physician Assistants and Nurse Practitioners) who all work together to provide you with the care you need, when you need it. You will need a follow up appointment in 12 months.  Please call our office 2 months in advance to schedule this appointment.  You may see Dr. Marlou Porch or one of the following Advanced Practice Providers on your designated Care Team:   Truitt Merle, NP Cecilie Kicks, NP . Kathyrn Drown, NP  Any Other Special Instructions Will Be Listed Below (If Applicable).

## 2018-10-31 ENCOUNTER — Encounter: Payer: Self-pay | Admitting: Rheumatology

## 2018-10-31 ENCOUNTER — Other Ambulatory Visit: Payer: Self-pay | Admitting: Rheumatology

## 2018-10-31 DIAGNOSIS — M3501 Sicca syndrome with keratoconjunctivitis: Secondary | ICD-10-CM

## 2018-11-01 ENCOUNTER — Telehealth: Payer: Self-pay | Admitting: Rheumatology

## 2018-11-01 MED ORDER — FOLIC ACID 1 MG PO TABS: 3.0000 mg | ORAL_TABLET | Freq: Every day | ORAL | 3 refills | Status: DC

## 2018-11-01 MED ORDER — HYDROXYCHLOROQUINE SULFATE 200 MG PO TABS: 200.0000 mg | ORAL_TABLET | Freq: Two times a day (BID) | ORAL | 0 refills | Status: DC

## 2018-11-01 NOTE — Telephone Encounter (Signed)
Prescription sent to the pharmacy.

## 2018-11-01 NOTE — Telephone Encounter (Signed)
Patient prefers to stay on folic acid 3 mg p.o. daily due to hair loss.  Please call in a prescription for folic acid 1 mg tablet 3 tablets p.o. daily.  Give her 90-day supply with 3 refills.

## 2018-11-01 NOTE — Telephone Encounter (Signed)
Patient called stating Dr. Estanislado Pandy increased her Folic Acid to 3 mg due to "her hair falling out" at her office visit on 06/17/18.  Patient states she is frustrated that her medication was sent in for 2 mg and is requesting to speak with Dr. Estanislado Pandy directly "regarding the dosage they agreed upon."   Please advise

## 2018-11-01 NOTE — Telephone Encounter (Signed)
Last Visit: 09/23/18 Next Visit: 12/23/18 Labs: 09/09/18 Sodium 145 all other labs WNL PLQ Eye Exam: 10/10/2018 WNL  Okay to refill per Dr. Estanislado Pandy

## 2018-11-01 NOTE — Telephone Encounter (Signed)
Patient should be on folic acid 2 mg a day which should be 2 tablets a day.

## 2018-12-09 NOTE — Progress Notes (Signed)
Office Visit Note  Patient: Marisa Orozco             Date of Birth: 1950-10-19           MRN: [id number]             PCP: Caryl Bis, MD Referring: Caryl Bis, MD Visit Date: 12/23/2018 Occupation: @GUAROCC @  Subjective:  Left shoulder pain.   She is on Fosamax 70 mg weekly and a calcium/vitamin D supplement managed by her PCP Dr. Quillian Quince.  Last DEXA on 06/02/2018 showed T score of -2.7 at AP spine and -15.2% change in BMD.  Also significant BMD changes in both hips.  History of Present Illness: Marisa Orozco is a 68 y.o. female with history of dermatomyositis, Sjogren's and osteoarthritis.  She has been tapering her prednisone gradually.  She is on prednisone 4.5 mg p.o. daily.  She is also taking methotrexate on a regular basis.  She states she was placed on Fosamax for osteoporosis which she has been tolerating well.  She states recently she has been experiencing some discomfort in her left shoulder which could be related to the flu vaccine .  She denies any rash or any joint discomfort.  She has not not experienced any muscle weakness.  Activities of Daily Living:  Patient reports morning stiffness for 0 minute.   Patient Reports nocturnal pain.  Difficulty dressing/grooming: Denies Difficulty climbing stairs: Denies Difficulty getting out of chair: Denies Difficulty using hands for taps, buttons, cutlery, and/or writing: Denies  Review of Systems  Constitutional: Negative for fatigue, night sweats, weight gain and weight loss.  HENT: Positive for mouth dryness. Negative for mouth sores, trouble swallowing, trouble swallowing and nose dryness.   Eyes: Positive for dryness. Negative for pain, redness and visual disturbance.  Respiratory: Negative for cough, shortness of breath and difficulty breathing.   Cardiovascular: Negative for chest pain, palpitations, hypertension, irregular heartbeat and swelling in legs/feet.  Gastrointestinal: Negative for blood in stool,  constipation and diarrhea.  Endocrine: Negative for increased urination.  Genitourinary: Negative for vaginal dryness.  Musculoskeletal: Positive for arthralgias and joint pain. Negative for joint swelling, myalgias, muscle weakness, morning stiffness, muscle tenderness and myalgias.  Skin: Negative for color change, rash, hair loss, skin tightness, ulcers and sensitivity to sunlight.  Allergic/Immunologic: Negative for susceptible to infections.  Neurological: Negative for dizziness, memory loss, night sweats and weakness.  Hematological: Negative for swollen glands.  Psychiatric/Behavioral: Positive for sleep disturbance. Negative for depressed mood. The patient is not nervous/anxious.     PMFS History:  Patient Active Problem List   Diagnosis Date Noted   Primary osteoarthritis of right knee 02/03/2018   Primary osteoarthritis of both hands 09/08/2017   High risk medication use 04/07/2016   Sjogren's disease ([place]) 02/04/2016   Dermatomyositis ([place]) 02/04/2016   Former smoker 02/04/2016   Thymoma 02/04/2016   Fatigue 02/15/2012    Past Medical History:  Diagnosis Date   Allergy    Anxiety    Cancer ([place])    basal cell carcinoma - face   Dermatomyositis ([place]) 02/04/2016   Jo 1 Positive  Skin Bx- Subtle interface Dermatitis    Former smoker 02/04/2016   GERD (gastroesophageal reflux disease)    otc med prn   PONV (postoperative nausea and vomiting)    Sjogren's disease ([place]) 02/04/2016   Positive ANA, Positive Ro, Positive CCP, Negative RF, Parotitis, Positive Sicca   Sjogren's syndrome ([place])    from MD note  Family History  Problem Relation Age of Onset   COPD Mother    Heart disease Mother    Diabetes Father    Kidney disease Father    Epilepsy Daughter    Rectal cancer Maternal Uncle    Esophageal cancer Neg Hx    Stomach cancer Neg Hx    Ulcerative colitis Neg Hx    Past Surgical History:  Procedure Laterality Date   BREAST  SURGERY     augmentation   BUNIONECTOMY     right foot   COLONOSCOPY     DIAGNOSTIC LAPAROSCOPY     DILATION AND CURETTAGE OF UTERUS  04/15/2011   Procedure: DILATATION AND CURETTAGE;  Surgeon: [name];  Location: [facility];  Service: Gynecology;  Laterality: N/A;   MUSCLE BIOPSY Left 01/02/2016   Procedure: MUSCLE BIOPSY LEFT DELTOID;  Surgeon: [name], MD;  Location: [facility];  Service: General;  Laterality: Left;  left deltoid muscle bx   NASAL SEPTUM SURGERY     SVD     x 2   TONSILLECTOMY     WISDOM TOOTH EXTRACTION     Social History   Social History Narrative   Not on file   Immunization History  Administered Date(s) Administered   Influenza, High Dose Seasonal PF 01/07/2018   Influenza-Unspecified 01/04/2014   Pneumococcal Conjugate-13 01/05/2016   Zoster Recombinat (Shingrix) 08/11/2017, 12/17/2017     Objective: Vital Signs: BP 135/73 (BP Location: Left Arm, Patient Position: Sitting, Cuff Size: Normal)    Pulse 70    Resp 13    Ht 5\' 6"  (1.676 m)    Wt 185 lb 9.6 oz (84.2 kg)    BMI 29.96 kg/m    Physical Exam Vitals signs and nursing note reviewed.  Constitutional:      Appearance: She is well-developed.  HENT:     Head: Normocephalic and atraumatic.  Eyes:     Conjunctiva/sclera: Conjunctivae normal.  Neck:     Musculoskeletal: Normal range of motion.  Cardiovascular:     Rate and Rhythm: Normal rate and regular rhythm.     Heart sounds: Normal heart sounds.  Pulmonary:     Effort: Pulmonary effort is normal.     Breath sounds: Normal breath sounds.  Abdominal:     General: Bowel sounds are normal.     Palpations: Abdomen is soft.  Lymphadenopathy:     Cervical: No cervical adenopathy.  Skin:    General: Skin is warm and dry.     Capillary Refill: Capillary refill takes less than 2 seconds.  Neurological:     Mental Status: She is alert and oriented to person, place, and time.  Psychiatric:         Behavior: Behavior normal.      Musculoskeletal Exam: C-spine, thoracic and lumbar spine with good range of motion.  Shoulder joints, elbow joints, wrist joints, MCPs PIPs and DIPs with good range of motion with no synovitis.  Hip joints, knee joints, ankles, MTPs and PIPs with good range of motion with no synovitis.  She had no muscular weakness or tenderness on examination.  CDAI Exam: CDAI Score: -- Patient Global: --; Provider Global: -- Swollen: --; Tender: -- Joint Exam   No joint exam has been documented for this visit   There is currently no information documented on the homunculus. Go to the Rheumatology activity and complete the homunculus joint exam.  Investigation: No additional findings.  Imaging: No results found.  Recent Labs:  Lab Results  Component Value Date   WBC 5.5 12/21/2018   HGB 14.2 12/21/2018   PLT 206 12/21/2018   NA 143 12/21/2018   K 4.1 12/21/2018   CL 108 12/21/2018   CO2 30 12/21/2018   GLUCOSE 100 (H) 12/21/2018   BUN 20 12/21/2018   CREATININE 0.75 12/21/2018   BILITOT 0.5 12/21/2018   ALKPHOS 44 10/12/2016   AST 28 12/21/2018   ALT 35 (H) 12/21/2018   PROT 6.7 12/21/2018   ALBUMIN 4.2 10/12/2016   CALCIUM 9.5 12/21/2018   GFRAA 95 12/21/2018   QFTBGOLDPLUS NEGATIVE 07/07/2017    Speciality Comments: PLQ Eye Exam: 10/10/2018 WNL @ [facility]. Follow up 1 year.  Procedures:  No procedures performed Allergies: Codeine, Flagyl [metronidazole hcl], Nexium [esomeprazole magnesium], Imuran [azathioprine], Sulfa antibiotics, and Sulfa drugs cross reactors   Assessment / Plan:     Visit Diagnoses: Dermatomyositis ([place]) - Jo 1+, positive muscle biopsy for dermatomyositis.  She has been doing well on methotrexate and prednisone combination.  She is trying to taper prednisone gradually.  She had no muscular weakness or tenderness.  She is experiencing some discomfort in her left shoulder which may be related to the flu vaccine.   We decided to keep her on the current dose of prednisone.  She is concerned that she flared when she tapered her prednisone to 4 mg in the past.  Prescription refill for methotrexate and Plaquenil was given today.  High risk medication use - Methotrexate 0.8 ml every 7 days, folic acid 1 mg 3 tablets daily, Plaquenil 200 mg 1 tablet twice daily, and prednisone 4.5 mg daily..  Most recent CBC/CMP within normal limits except for elevated ALT but stable on 12/21/2018.Marland Kitchen  Sjogren's syndrome with keratoconjunctivitis sicca ([facility])-she continues to have some sicca symptoms and she has been using over-the-counter products.  Primary osteoarthritis of both hands-currently not having much discomfort.  Primary osteoarthritis of right knee-it bothers her off and on.  She has been walking daily 2 miles.  History of vitamin D deficiency-she is on the supplement.  Age-related osteoporosis without current pathological fracture - She is on Fosamax by her PCP.  She has been tolerating Fosamax well.  Hair loss-improved  Thymoma - Followed up at [facility].  Former smoker  Orders: No orders of the defined types were placed in this encounter.  Meds ordered this encounter  Medications   methotrexate 50 MG/2ML injection    Sig: INJECT 0.8ML INTO THE SKIN ONCE A WEEK    Dispense:  10 mL    Refill:  0   predniSONE (DELTASONE) 1 MG tablet    Sig: Take 4.5 tablets (4.5 mg total) by mouth daily with breakfast.    Dispense:  405 tablet    Refill:  0     Follow-Up Instructions: Return in about 3 months (around 03/24/2019) for Dermatomyositis, Sjogren's, Osteoarthritis.   Bo Merino, MD  Note - This record has been created using Editor, commissioning.  Chart creation errors have been sought, but may not always  have been located. Such creation errors do not reflect on  the standard of medical care.

## 2018-12-13 DIAGNOSIS — Z23 Encounter for immunization: Secondary | ICD-10-CM | POA: Diagnosis not present

## 2018-12-19 ENCOUNTER — Encounter: Payer: Self-pay | Admitting: Rheumatology

## 2018-12-20 ENCOUNTER — Other Ambulatory Visit: Payer: Self-pay | Admitting: *Deleted

## 2018-12-20 DIAGNOSIS — Z79899 Other long term (current) drug therapy: Secondary | ICD-10-CM

## 2018-12-21 ENCOUNTER — Other Ambulatory Visit: Payer: Self-pay

## 2018-12-21 DIAGNOSIS — Z79899 Other long term (current) drug therapy: Secondary | ICD-10-CM

## 2018-12-22 LAB — CBC WITH DIFFERENTIAL/PLATELET
Absolute Monocytes: 402 cells/uL (ref 200–950)
Basophils Absolute: 39 cells/uL (ref 0–200)
Basophils Relative: 0.7 %
Eosinophils Absolute: 204 cells/uL (ref 15–500)
Eosinophils Relative: 3.7 %
HCT: 42.3 % (ref 35.0–45.0)
Hemoglobin: 14.2 g/dL (ref 11.7–15.5)
Lymphs Abs: 1645 cells/uL (ref 850–3900)
MCH: 31.4 pg (ref 27.0–33.0)
MCHC: 33.6 g/dL (ref 32.0–36.0)
MCV: 93.6 fL (ref 80.0–100.0)
MPV: 11 fL (ref 7.5–12.5)
Monocytes Relative: 7.3 %
Neutro Abs: 3212 cells/uL (ref 1500–7800)
Neutrophils Relative %: 58.4 %
Platelets: 206 10*3/uL (ref 140–400)
RBC: 4.52 10*6/uL (ref 3.80–5.10)
RDW: 13.2 % (ref 11.0–15.0)
Total Lymphocyte: 29.9 %
WBC: 5.5 10*3/uL (ref 3.8–10.8)

## 2018-12-22 LAB — COMPLETE METABOLIC PANEL WITH GFR
AG Ratio: 1.9 (calc) (ref 1.0–2.5)
ALT: 35 U/L — ABNORMAL HIGH (ref 6–29)
AST: 28 U/L (ref 10–35)
Albumin: 4.4 g/dL (ref 3.6–5.1)
Alkaline phosphatase (APISO): 43 U/L (ref 37–153)
BUN: 20 mg/dL (ref 7–25)
CO2: 30 mmol/L (ref 20–32)
Calcium: 9.5 mg/dL (ref 8.6–10.4)
Chloride: 108 mmol/L (ref 98–110)
Creat: 0.75 mg/dL (ref 0.50–0.99)
GFR, Est African American: 95 mL/min/{1.73_m2} (ref >=60)
GFR, Est Non African American: 82 mL/min/{1.73_m2} (ref >=60)
Globulin: 2.3 g/dL (calc) (ref 1.9–3.7)
Glucose, Bld: 100 mg/dL — ABNORMAL HIGH (ref 65–99)
Potassium: 4.1 mmol/L (ref 3.5–5.3)
Sodium: 143 mmol/L (ref 135–146)
Total Bilirubin: 0.5 mg/dL (ref 0.2–1.2)
Total Protein: 6.7 g/dL (ref 6.1–8.1)

## 2018-12-22 LAB — CK: Total CK: 52 U/L (ref 29–143)

## 2018-12-22 NOTE — Progress Notes (Signed)
ALT is mildly elevated most likely due to methotrexate use.  All other labs are within normal limits.

## 2018-12-23 ENCOUNTER — Encounter: Payer: Self-pay | Admitting: Rheumatology

## 2018-12-23 ENCOUNTER — Other Ambulatory Visit: Payer: Self-pay

## 2018-12-23 ENCOUNTER — Ambulatory Visit (INDEPENDENT_AMBULATORY_CARE_PROVIDER_SITE_OTHER): Payer: Medicare Other | Admitting: Rheumatology

## 2018-12-23 VITALS — BP 135/73 | HR 70 | Resp 13 | Ht 66.0 in | Wt 185.6 lb

## 2018-12-23 DIAGNOSIS — Z8639 Personal history of other endocrine, nutritional and metabolic disease: Secondary | ICD-10-CM | POA: Diagnosis not present

## 2018-12-23 DIAGNOSIS — L659 Nonscarring hair loss, unspecified: Secondary | ICD-10-CM

## 2018-12-23 DIAGNOSIS — M3390 Dermatopolymyositis, unspecified, organ involvement unspecified: Secondary | ICD-10-CM | POA: Diagnosis not present

## 2018-12-23 DIAGNOSIS — M810 Age-related osteoporosis without current pathological fracture: Secondary | ICD-10-CM

## 2018-12-23 DIAGNOSIS — M1711 Unilateral primary osteoarthritis, right knee: Secondary | ICD-10-CM

## 2018-12-23 DIAGNOSIS — Z79899 Other long term (current) drug therapy: Secondary | ICD-10-CM | POA: Diagnosis not present

## 2018-12-23 DIAGNOSIS — Z87891 Personal history of nicotine dependence: Secondary | ICD-10-CM

## 2018-12-23 DIAGNOSIS — D150 Benign neoplasm of thymus: Secondary | ICD-10-CM

## 2018-12-23 DIAGNOSIS — M3501 Sicca syndrome with keratoconjunctivitis: Secondary | ICD-10-CM | POA: Diagnosis not present

## 2018-12-23 DIAGNOSIS — M19042 Primary osteoarthritis, left hand: Secondary | ICD-10-CM

## 2018-12-23 DIAGNOSIS — M19041 Primary osteoarthritis, right hand: Secondary | ICD-10-CM | POA: Diagnosis not present

## 2018-12-23 DIAGNOSIS — D4989 Neoplasm of unspecified behavior of other specified sites: Secondary | ICD-10-CM

## 2018-12-23 MED ORDER — METHOTREXATE SODIUM CHEMO INJECTION 50 MG/2ML: INTRAMUSCULAR | 0 refills | Status: DC

## 2018-12-23 MED ORDER — PREDNISONE 1 MG PO TABS: 4.5000 mg | ORAL_TABLET | Freq: Every day | ORAL | 0 refills | Status: DC

## 2018-12-23 NOTE — Patient Instructions (Signed)
Standing Labs We placed an order today for your standing lab work.    Please come back and get your standing labs in December and every 3 months  We have open lab daily Monday through Thursday from 8:30-12:30 PM and 1:30-4:30 PM and Friday from 8:30-12:30 PM and 1:30 -4:00 PM at the office of Dr. Bo Merino.   You may experience shorter wait times on Monday and Friday afternoons. The office is located at [address], [place], [place], [place] [address] No appointment is necessary.   Labs are drawn by [facility].  You may receive a bill from [facility] for your lab work.  If you wish to have your labs drawn at another location, please call the office 24 hours in advance to send orders.  If you have any questions regarding directions or hours of operation,  please call [phone].   Just as a reminder please drink plenty of water prior to coming for your lab work. Thanks!

## 2019-01-16 ENCOUNTER — Encounter: Payer: Self-pay | Admitting: Rheumatology

## 2019-01-16 MED ORDER — "TUBERCULIN SYRINGE 27G X 1/2"" 1 ML MISC": 3 refills | Status: DC

## 2019-01-16 NOTE — Telephone Encounter (Signed)
Last Visit: 12/23/18 Next Visit: 03/17/19  Okay to refill per Dr. Estanislado Pandy

## 2019-01-27 ENCOUNTER — Other Ambulatory Visit: Payer: Self-pay | Admitting: Rheumatology

## 2019-01-27 DIAGNOSIS — M3501 Sicca syndrome with keratoconjunctivitis: Secondary | ICD-10-CM

## 2019-01-27 NOTE — Telephone Encounter (Signed)
Last Visit: 12/23/18 Next Visit: 03/17/19 Labs: 12/21/18 ALT is mildly elevated. All other labs are within normal limits Eye exam:  10/10/2018 WNL   Okay to refill per Dr. Estanislado Pandy

## 2019-02-25 ENCOUNTER — Encounter: Payer: Self-pay | Admitting: Rheumatology

## 2019-02-25 DIAGNOSIS — M3501 Sicca syndrome with keratoconjunctivitis: Secondary | ICD-10-CM

## 2019-02-27 MED ORDER — HYDROXYCHLOROQUINE SULFATE 200 MG PO TABS: 200.0000 mg | ORAL_TABLET | Freq: Two times a day (BID) | ORAL | 0 refills | Status: DC

## 2019-02-27 NOTE — Telephone Encounter (Signed)
Last Visit: 12/23/2018 Next Visit: 03/17/2019 Labs: 12/21/2018 ALT is mildly elevated most likely due to methotrexate use. All other labs are within normal limits. Eye exam: 10/10/2018  Okay to refill per Dr. Estanislado Pandy.

## 2019-03-14 ENCOUNTER — Encounter: Payer: Self-pay | Admitting: Rheumatology

## 2019-03-14 ENCOUNTER — Other Ambulatory Visit: Payer: Self-pay

## 2019-03-14 DIAGNOSIS — Z79899 Other long term (current) drug therapy: Secondary | ICD-10-CM

## 2019-03-15 ENCOUNTER — Telehealth: Payer: Self-pay | Admitting: Rheumatology

## 2019-03-15 NOTE — Progress Notes (Signed)
Virtual Visit via Video Note  I connected with Marisa Orozco on 03/17/19 at 10:45 AM EST by a video enabled telemedicine application and verified that I am speaking with the correct person using two identifiers.  Location: Patient: Home  Provider: Clinic  This service was conducted via virtual visit.  Both audio and visual tools were used.  The patient was located at home. I was located in my office.  Consent was obtained prior to the virtual visit and is aware of possible charges through their insurance for this visit.  The patient is an established patient.  Dr. Estanislado Pandy, MD conducted the virtual visit and Hazel Sams, PA-C acted as scribe during the service.  Office staff helped with scheduling follow up visits after the service was conducted.   I discussed the limitations of evaluation and management by telemedicine and the availability of in person appointments. The patient expressed understanding and agreed to proceed.  CC: Discuss medications  History of Present Illness: Patient is a 68 year old female with past medical history of dermatomyositis, sjogren's syndrome, and osteoarthritis.  She is taking methotrexate 0.8 ml sq once weekly, folic acid 2 mg po daily, and plaquenil 200 mg BID.  She is currently on prednisone 5 mg po daily.  She tried tapering to 4.5 mg daily but developed increased joint and inflammation. She is not having any difficulty getting up from a chair.  Her mobility has been good.  She has been walking 3 miles daily for exercise.   She has not had any recent rashes.   Review of Systems  Constitutional: Negative for fever and malaise/fatigue.  HENT:       +Dry mouth  Eyes: Negative for photophobia, pain, discharge and redness.  Respiratory: Negative for cough, shortness of breath and wheezing.   Cardiovascular: Negative for chest pain and palpitations.  Gastrointestinal: Negative for blood in stool, constipation and diarrhea.  Genitourinary: Negative for dysuria.   Musculoskeletal: Negative for back pain, joint pain, myalgias and neck pain.  Skin: Negative for rash.  Neurological: Negative for dizziness and headaches.  Psychiatric/Behavioral: Negative for depression. The patient has insomnia. The patient is not nervous/anxious.       Observations/Objective: Physical Exam  Constitutional: She is oriented to person, place, and time and well-developed, well-nourished, and in no distress.  HENT:  Head: Normocephalic and atraumatic.  Eyes: Conjunctivae are normal.  Pulmonary/Chest: Effort normal.  Neurological: She is alert and oriented to person, place, and time.  Psychiatric: Mood, memory, affect and judgment normal.   Patient reports morning stiffness for 0 minutes.   Patient denies nocturnal pain.  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 CK was 63 on 03/16/19.  She is not having any increased muscle weakness or muscle tenderness at this time. She is not having any difficulty getting up from a chair or raising her arms above her head.  She is clinically doing well on Methotrexate 0.8 ml sq once weekly, folic acid 2 mg pod daily, and plaquenil 200 mg 1 tablet BID.  She tried tapering prednisone to 4.5 mg daily but developed increased joint pain and inflammation, so she increased the dose of prednisone to 5 mg po daily.  She is not having any  joint pain or inflammation at this time.  She has been walking 3 miles daily for exercise.  She will continue on the current treatment regimen.  We advised her to not discontinue PLQ as requested at this time.  She was advised to notify us if she develops any signs or symptoms of a flare.  She will follow  up in 3 months.   High risk medication use - Methotrexate 0.8 ml every 7 days, folic acid 1 mg 3 tablets daily, Plaquenil 200 mg 1 tablet twice daily, and prednisone 5 mg daily. CBC and CMP WNL on 03/16/19. She will be due to update lab work in March and every 3 months.   Sjogren's syndrome with keratoconjunctivitis sicca (HCC)-She has chronic sicca symptoms.  Her mouth dryness has been severe.  We discussed drinking lots of fluids and using xylimelt daily.   Primary osteoarthritis of both hands-She is not having any joint pain or joint swelling currently.   Primary osteoarthritis of right knee-  She has been walking daily 3 miles daily.  History of vitamin D deficiency-She is taking a vitamin D supplement.  Age-related osteoporosis without current pathological fracture - She is on Fosamax by her PCP.  She has been tolerating Fosamax well. She is taking a calcium and vitamin D supplement.   Hair loss-improved  Thymoma - Followed up at [facility].  Former smoker   Follow Up Instructions: She will follow up in 3 months.    I discussed the assessment and treatment plan with the patient. The patient was provided an opportunity to ask questions and all were answered. The patient agreed with the plan and demonstrated an understanding of the instructions.   The patient was advised to call back or seek an in-person evaluation if the symptoms worsen or if the condition fails to improve as anticipated.  I provided 25 minutes of non-face-to-face time during this encounter.   Bo Merino, MD  Scribed by-  Hazel Sams, PA-C

## 2019-03-15 NOTE — Telephone Encounter (Signed)
Patient going to [facility] now for labs. Please release orders.

## 2019-03-15 NOTE — Telephone Encounter (Signed)
Lab orders were released yesterday for [facility].

## 2019-03-16 DIAGNOSIS — Z79899 Other long term (current) drug therapy: Secondary | ICD-10-CM | POA: Diagnosis not present

## 2019-03-17 ENCOUNTER — Encounter: Payer: Self-pay | Admitting: Rheumatology

## 2019-03-17 ENCOUNTER — Telehealth (INDEPENDENT_AMBULATORY_CARE_PROVIDER_SITE_OTHER): Payer: Medicare Other | Admitting: Rheumatology

## 2019-03-17 ENCOUNTER — Other Ambulatory Visit: Payer: Self-pay

## 2019-03-17 DIAGNOSIS — M3501 Sicca syndrome with keratoconjunctivitis: Secondary | ICD-10-CM

## 2019-03-17 DIAGNOSIS — M3390 Dermatopolymyositis, unspecified, organ involvement unspecified: Secondary | ICD-10-CM | POA: Diagnosis not present

## 2019-03-17 DIAGNOSIS — Z79899 Other long term (current) drug therapy: Secondary | ICD-10-CM

## 2019-03-17 DIAGNOSIS — D4989 Neoplasm of unspecified behavior of other specified sites: Secondary | ICD-10-CM

## 2019-03-17 DIAGNOSIS — L659 Nonscarring hair loss, unspecified: Secondary | ICD-10-CM

## 2019-03-17 DIAGNOSIS — M19042 Primary osteoarthritis, left hand: Secondary | ICD-10-CM | POA: Diagnosis not present

## 2019-03-17 DIAGNOSIS — M810 Age-related osteoporosis without current pathological fracture: Secondary | ICD-10-CM

## 2019-03-17 DIAGNOSIS — Z87891 Personal history of nicotine dependence: Secondary | ICD-10-CM

## 2019-03-17 DIAGNOSIS — Z8639 Personal history of other endocrine, nutritional and metabolic disease: Secondary | ICD-10-CM

## 2019-03-17 DIAGNOSIS — M1711 Unilateral primary osteoarthritis, right knee: Secondary | ICD-10-CM

## 2019-03-17 DIAGNOSIS — M19041 Primary osteoarthritis, right hand: Secondary | ICD-10-CM

## 2019-03-17 DIAGNOSIS — M3313 Other dermatomyositis without myopathy: Secondary | ICD-10-CM

## 2019-03-17 LAB — CBC WITH DIFFERENTIAL/PLATELET
Absolute Monocytes: 460 cells/uL (ref 200–950)
Basophils Absolute: 69 cells/uL (ref 0–200)
Basophils Relative: 1.1 %
Eosinophils Absolute: 164 cells/uL (ref 15–500)
Eosinophils Relative: 2.6 %
HCT: 43.3 % (ref 35.0–45.0)
Hemoglobin: 14.6 g/dL (ref 11.7–15.5)
Lymphs Abs: 1940 cells/uL (ref 850–3900)
MCH: 31.3 pg (ref 27.0–33.0)
MCHC: 33.7 g/dL (ref 32.0–36.0)
MCV: 92.7 fL (ref 80.0–100.0)
MPV: 11.6 fL (ref 7.5–12.5)
Monocytes Relative: 7.3 %
Neutro Abs: 3667 cells/uL (ref 1500–7800)
Neutrophils Relative %: 58.2 %
Platelets: 192 10*3/uL (ref 140–400)
RBC: 4.67 10*6/uL (ref 3.80–5.10)
RDW: 12.6 % (ref 11.0–15.0)
Total Lymphocyte: 30.8 %
WBC: 6.3 10*3/uL (ref 3.8–10.8)

## 2019-03-17 LAB — COMPLETE METABOLIC PANEL WITH GFR
AG Ratio: 1.7 (calc) (ref 1.0–2.5)
ALT: 20 U/L (ref 6–29)
AST: 18 U/L (ref 10–35)
Albumin: 4.5 g/dL (ref 3.6–5.1)
Alkaline phosphatase (APISO): 49 U/L (ref 37–153)
BUN: 21 mg/dL (ref 7–25)
CO2: 25 mmol/L (ref 20–32)
Calcium: 9.8 mg/dL (ref 8.6–10.4)
Chloride: 106 mmol/L (ref 98–110)
Creat: 0.82 mg/dL (ref 0.50–0.99)
GFR, Est African American: 85 mL/min/{1.73_m2} (ref >=60)
GFR, Est Non African American: 74 mL/min/{1.73_m2} (ref >=60)
Globulin: 2.6 g/dL (calc) (ref 1.9–3.7)
Glucose, Bld: 85 mg/dL (ref 65–99)
Potassium: 3.9 mmol/L (ref 3.5–5.3)
Sodium: 142 mmol/L (ref 135–146)
Total Bilirubin: 0.6 mg/dL (ref 0.2–1.2)
Total Protein: 7.1 g/dL (ref 6.1–8.1)

## 2019-03-17 LAB — CK: Total CK: 63 U/L (ref 29–143)

## 2019-03-21 ENCOUNTER — Other Ambulatory Visit: Payer: Self-pay | Admitting: Rheumatology

## 2019-03-21 NOTE — Telephone Encounter (Signed)
Last Visit: 03/17/2019 telemedicine  Next Visit: message sent to the front desk to schedule.   Per note on 03/17/2019  She is currently on prednisone 5 mg po daily.  She tried tapering to 4.5 mg daily but developed increased joint and inflammation.  Okay to refill per Dr. Estanislado Pandy.

## 2019-03-28 ENCOUNTER — Telehealth: Payer: Self-pay | Admitting: Rheumatology

## 2019-03-28 NOTE — Telephone Encounter (Signed)
LMOM for patient to call and schedule 3 month follow-up appointment. °

## 2019-03-28 NOTE — Telephone Encounter (Signed)
-----   Message from Shona Needles, RT sent at 03/17/2019 12:44 PM EST ----- Regarding: 3 MONTH F/U

## 2019-04-02 ENCOUNTER — Encounter: Payer: Self-pay | Admitting: Rheumatology

## 2019-04-03 MED ORDER — METHOTREXATE SODIUM CHEMO INJECTION 50 MG/2ML: INTRAMUSCULAR | 0 refills | Status: DC

## 2019-04-03 MED ORDER — "TUBERCULIN SYRINGE 27G X 1/2"" 1 ML MISC": 3 refills | Status: DC

## 2019-04-03 NOTE — Telephone Encounter (Signed)
Last Visit: 03/17/2019 telemedicine  Next Visit: 06/23/2019 Labs: 03/16/2019 WNL   Okay to refill per Dr. Estanislado Pandy.

## 2019-04-10 ENCOUNTER — Encounter: Payer: Self-pay | Admitting: Rheumatology

## 2019-04-26 ENCOUNTER — Encounter: Payer: Self-pay | Admitting: "Endocrinology

## 2019-04-26 ENCOUNTER — Other Ambulatory Visit: Payer: Self-pay

## 2019-04-26 ENCOUNTER — Ambulatory Visit (INDEPENDENT_AMBULATORY_CARE_PROVIDER_SITE_OTHER): Payer: Medicare Other | Admitting: "Endocrinology

## 2019-04-26 VITALS — BP 125/73 | HR 75 | Ht 66.5 in | Wt 180.0 lb

## 2019-04-26 DIAGNOSIS — E039 Hypothyroidism, unspecified: Secondary | ICD-10-CM | POA: Diagnosis not present

## 2019-04-26 MED ORDER — LEVOTHYROXINE SODIUM 25 MCG PO TABS: 25.0000 ug | ORAL_TABLET | Freq: Every day | ORAL | 3 refills | Status: DC

## 2019-04-26 NOTE — Progress Notes (Signed)
Endocrinology Consult Note                                         04/26/2019, 6:59 PM   Marisa Orozco is a 69 y.o.-year-old female patient being seen in consultation for hypothyroidism referred by Caryl Bis, MD.   Past Medical History:  Diagnosis Date  . Allergy   . Anxiety   . Cancer ([place])    basal cell carcinoma - face  . Dermatomyositis ([place]) 02/04/2016   Jo 1 Positive  Skin Bx- Subtle interface Dermatitis   . Former smoker 02/04/2016  . GERD (gastroesophageal reflux disease)    otc med prn  . PONV (postoperative nausea and vomiting)   . Sjogren's disease ([place]) 02/04/2016   Positive ANA, Positive Ro, Positive CCP, Negative RF, Parotitis, Positive Sicca  . Sjogren's syndrome ([place])    from MD note    Past Surgical History:  Procedure Laterality Date  . BREAST SURGERY     augmentation  . BUNIONECTOMY     right foot  . COLONOSCOPY    . DIAGNOSTIC LAPAROSCOPY    . DILATION AND CURETTAGE OF UTERUS  04/15/2011   Procedure: DILATATION AND CURETTAGE;  Surgeon: [name];  Location: [facility];  Service: Gynecology;  Laterality: N/A;  . MUSCLE BIOPSY Left 01/02/2016   Procedure: MUSCLE BIOPSY LEFT DELTOID;  Surgeon: [name], MD;  Location: [facility];  Service: General;  Laterality: Left;  left deltoid muscle bx  . NASAL SEPTUM SURGERY    . SVD     x 2  . TONSILLECTOMY    . WISDOM TOOTH EXTRACTION      Social History   Socioeconomic History  . Marital status: Married    Spouse name: Not on file  . Number of children: Not on file  . Years of education: Not on file  . Highest education level: Not on file  Occupational History  . Not on file  Tobacco Use  . Smoking status: Former Smoker    Packs/day: 1.00    Years: 25.00    Pack years: 25.00    Types: Cigarettes    Quit date: 08/05/2003    Years since quitting: 15.7  . Smokeless tobacco: Never Used   Substance and Sexual Activity  . Alcohol use: Yes    Comment: socially - wine  . Drug use: No  . Sexual activity: Yes    Birth control/protection: None  Other Topics Concern  . Not on file  Social History Narrative  . Not on file   Social Determinants of Health   Financial Resource Strain:   . Difficulty of Paying Living Expenses: Not on file  Food Insecurity:   . Worried About Charity fundraiser in the Last Year: Not on file  . Ran Out of Food in the Last Year: Not on file  Transportation Needs:   . Lack of Transportation (Medical): Not on file  .  Lack of Transportation (Non-Medical): Not on file  Physical Activity:   . Days of Exercise per Week: Not on file  . Minutes of Exercise per Session: Not on file  Stress:   . Feeling of Stress : Not on file  Social Connections:   . Frequency of Communication with Friends and Family: Not on file  . Frequency of Social Gatherings with Friends and Family: Not on file  . Attends Religious Services: Not on file  . Active Member of Clubs or Organizations: Not on file  . Attends Archivist Meetings: Not on file  . Marital Status: Not on file    Family History  Problem Relation Age of Onset  . COPD Mother   . Heart disease Mother   . Diabetes Father   . Kidney disease Father   . Epilepsy Daughter   . Rectal cancer Maternal Uncle   . Esophageal cancer Neg Hx   . Stomach cancer Neg Hx   . Ulcerative colitis Neg Hx     Outpatient Encounter Medications as of 04/26/2019  Medication Sig  . Ascorbic Acid (VITAMIN C PO) Take by mouth daily.  Marland Kitchen OVER THE COUNTER MEDICATION   . OVER THE COUNTER MEDICATION Osteo Sheath  . alendronate (FOSAMAX) 70 MG tablet Take 70 mg by mouth once a week. Take with a full glass of water on an empty stomach.  Marland Kitchen BIOTIN PO Take 8 mg by mouth daily.  . Calcium Citrate-Vitamin D (CALCIUM CITRATE+D3 PO) Take by mouth daily.  . Cholecalciferol (VITAMIN D3) 1000 UNIT/SPRAY LIQD Take 5 sprays by mouth  daily.   . Coenzyme Q10 (COQ10) 100 MG CAPS Take by mouth every morning.  . folic acid (FOLVITE) 1 MG tablet Take 3 tablets (3 mg total) by mouth daily.  . hydroxychloroquine (PLAQUENIL) 200 MG tablet Take 1 tablet (200 mg total) by mouth 2 (two) times daily.  Marland Kitchen levothyroxine (SYNTHROID) 25 MCG tablet Take 1 tablet (25 mcg total) by mouth daily before breakfast.  . methotrexate 50 MG/2ML injection INJECT 0.8ML INTO THE SKIN ONCE A WEEK  . Multiple Vitamins-Minerals (MULTIVITAMIN ADULT PO) Take 1 tablet by mouth daily.   Marland Kitchen OVER THE COUNTER MEDICATION Burberine, one capsule daily.  . predniSONE (DELTASONE) 1 MG tablet Take 5 tablets (5 mg total) by mouth daily with breakfast. (Patient taking differently: Take 4 mg by mouth daily with breakfast. )  . TUBERCULIN SYR 1CC/27GX1/2" 27G X 1/2" 1 ML MISC Patient to use weekly to inject Methotrexate  . TURMERIC PO Take 1 capsule by mouth daily.   Marland Kitchen VITAMIN K PO Take 1 tablet by mouth daily.   . [DISCONTINUED] Ginger, Zingiber officinalis, (GINGER ROOT PO) Take 1 tablet by mouth daily.    No facility-administered encounter medications on file as of 04/26/2019.    ALLERGIES: Allergies  Allergen Reactions  . Codeine Nausea And Vomiting  . Flagyl [Metronidazole Hcl] Other (See Comments)    Reaction muscle weakness  . Nexium [Esomeprazole Magnesium] Other (See Comments)    Reaction Muscle weakness  . Imuran [Azathioprine] Other (See Comments)    Joint Pain  . Sulfa Antibiotics Rash  . Sulfa Drugs Cross Reactors Rash   VACCINATION STATUS: Immunization History  Administered Date(s) Administered  . Influenza, High Dose Seasonal PF 01/07/2018  . Influenza-Unspecified 01/04/2014  . Pneumococcal Conjugate-13 01/05/2016  . Zoster Recombinat (Shingrix) 08/11/2017, 12/17/2017     HPI    Marisa Orozco  is a patient with the above medical history.  She is being seen in consultation for abnormal thyroid function test.  History is obtained directly  from the patient.  She was known to have abnormal thyroid function tests for the last 2 years mostly associated with high normal TSH or low normal T4.  She was not started on any thyroid hormone supplement nor antithyroid medications.  -She reports dry skin, hair loss, fluctuating body weight.  She denies heat/cold intolerance.  She denies dysphagia, shortness of breath, no voice change.  She has family history of thyroid dysfunction of what appears to be hypothyroidism in her mother.  -She has a medical history of Sjogren's syndrome for which she is on methotrexate, Plaquenil.  She is also on on and off prednisone treatment.   I reviewed patient's  thyroid tests:  Lab Results  Component Value Date   TSH 4.89 03/25/2018   TSH 4.18 03/25/2018   TSH 1.16 09/01/2016   TSH 0.87 02/18/2016      Pt denies feeling nodules in neck, No family history of thyroid cancer.  No history of  radiation therapy to head or neck. No recent use of iodine supplements.    ROS:  Constitutional: + Fluctuating body weight, no fatigue, no subjective hyperthermia, no subjective hypothermia Eyes: no blurry vision, no xerophthalmia ENT: no sore throat, no nodules palpated in throat, no dysphagia/odynophagia, no hoarseness Cardiovascular: no Chest Pain, no Shortness of Breath, no palpitations, no leg swelling Respiratory: no cough, no SOB Gastrointestinal: no Nausea/Vomiting/Diarhhea Musculoskeletal: no muscle/joint aches Skin:  Dry skin, hair loss Neurological: no tremors, no numbness, no tingling, no dizziness Psychiatric: no depression, no anxiety   Physical Exam: BP 125/73   Pulse 75   Ht 5' 6.5" (1.689 m)   Wt 180 lb (81.6 kg)   BMI 28.62 kg/m  Wt Readings from Last 3 Encounters:  04/26/19 180 lb (81.6 kg)  12/23/18 185 lb 9.6 oz (84.2 kg)  10/19/18 181 lb 6.4 oz (82.3 kg)    Constitutional:  Body mass index is 28.62 kg/m., not in acute distress, normal state of mind Eyes: PERRLA, EOMI, no  exophthalmos ENT: moist mucous membranes, no thyromegaly, no cervical lymphadenopathy Cardiovascular: normal precordial activity, Regular Rate and Rhythm. Respiratory:  adequate breathing efforts, no gross chest deformity,   Musculoskeletal: no gross deformities, strength intact in all four extremities Skin: moist, warm, no rashes Neurological: no tremor with outstretched hands, Deep tendon reflexes normal in all four extremities.   CMP ( most recent) CMP     Component Value Date/Time   NA 142 03/16/2019 0858   NA 137 01/16/2016 0000   K 3.9 03/16/2019 0858   CL 106 03/16/2019 0858   CO2 25 03/16/2019 0858   GLUCOSE 85 03/16/2019 0858   BUN 21 03/16/2019 0858   BUN 11 01/16/2016 0000   CREATININE 0.82 03/16/2019 0858   CALCIUM 9.8 03/16/2019 0858   PROT 7.1 03/16/2019 0858   ALBUMIN 4.2 10/12/2016 1202   AST 18 03/16/2019 0858   ALT 20 03/16/2019 0858   ALKPHOS 44 10/12/2016 1202   BILITOT 0.6 03/16/2019 0858   GFRNONAA 74 03/16/2019 0858   GFRAA 85 03/16/2019 0858    Lab Results  Component Value Date   TSH 4.89 03/25/2018   TSH 4.18 03/25/2018   TSH 1.16 09/01/2016   TSH 0.87 02/18/2016       ASSESSMENT: 1. Hypothyroidism  PLAN:   I have reviewed her available thyroid records which are consistent with mild primary hypothyroidism.  Patient  does not  have  gross goiter, thyroid nodules, or neck compression symptoms. I discussed the option of early initiation of levothyroxine which she will benefit from.  And she agrees with this plan.  I prescribed levothyroxine 25 mcg p.o. daily before breakfast.  - We discussed about correct intake of levothyroxine, at fasting, with water, separated by at least 30 minutes from breakfast, and separated by more than 4 hours from calcium, iron, multivitamins, acid reflux medications (PPIs). -Patient is made aware of the fact that thyroid hormone replacement is needed for life, dose to be adjusted by periodic monitoring of thyroid  function tests.  Her next thyroid function test will include antithyroid antibodies to assess the etiology of her thyroid dysfunction.  -Due to absence of clinical goiter, no need for thyroid ultrasound.  - Time spent with the patient: 45 minutes, of which >50% was spent in obtaining information about her symptoms, reviewing her previous labs, evaluations, and treatments, counseling her about her hypothyroidism, and developing a plan  for long term treatment as necessary. Please refer to " Patient Self Inventory" in the Media  tab for reviewed elements of pertinent patient history.  [name] participated in the discussions, expressed understanding, and voiced agreement with the above plans.  All questions were answered to her satisfaction. she is encouraged to contact clinic should she have any questions or concerns prior to her return visit.  Return in about 3 months (around 07/25/2019) for Follow up with Pre-visit Labs.  Glade Lloyd, MD [facility] [address] [place], [place] [address] Phone: [phone]  Fax: [phone]   04/26/2019, 6:59 PM  This note was partially dictated with voice recognition software. Similar sounding words can be transcribed inadequately or may not  be corrected upon review.

## 2019-05-07 IMAGING — CT CT CHEST W/O CM
2 of 4 series · 13 of 36 positions shown, 16 images · non-contrast
Comparison: 02/23/2017

CLINICAL DATA: Follow-up of mediastinal mass.  Diagnosed in 2962.

EXAM:
CT CHEST WITHOUT CONTRAST
TECHNIQUE: Multidetector CT imaging of the chest was performed following the
standard protocol without IV contrast.

[Series 2: chest 2.00 br40 s3 ax · axial · 0.59mm/px · z∈[+1702,+1966]mm · 10 of 156 slices shown, 13 images]
[im 12/156  mediastinal]
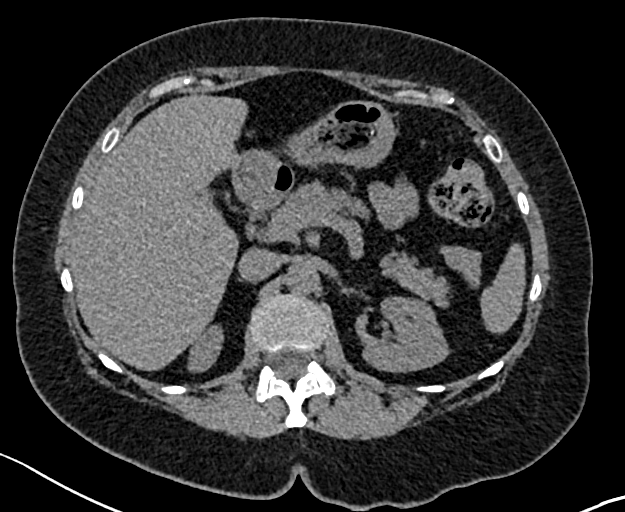
[im 12/156  lung]
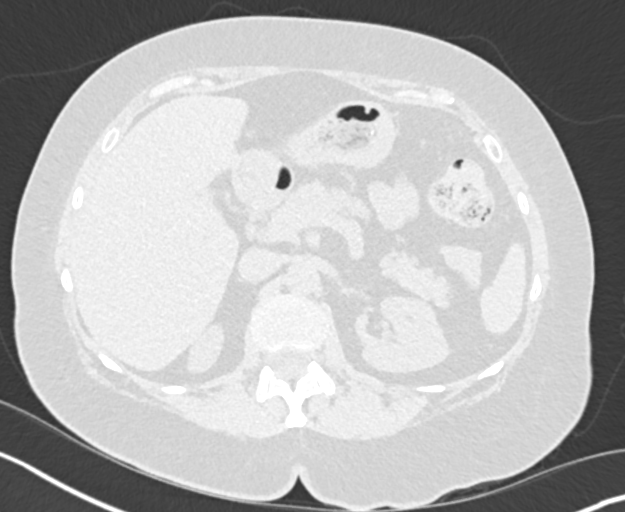
[im 24/156  lung]
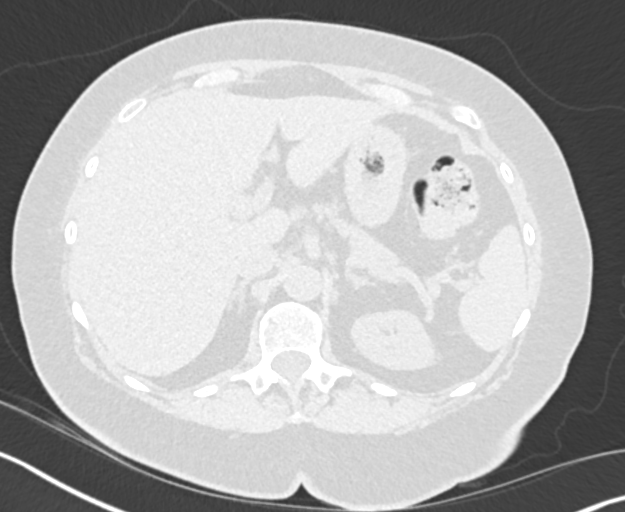
[im 48/156  lung]
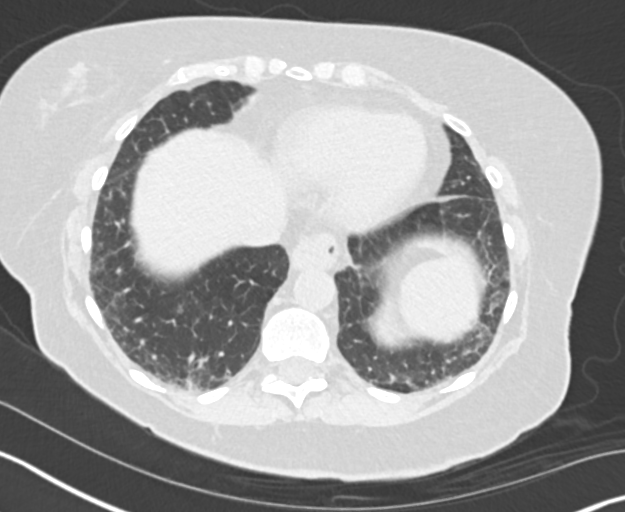
[im 60/156  lung]
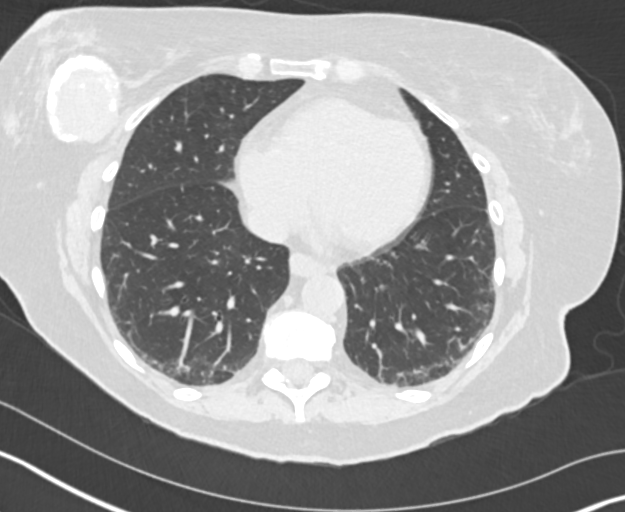
[im 72/156  mediastinal]
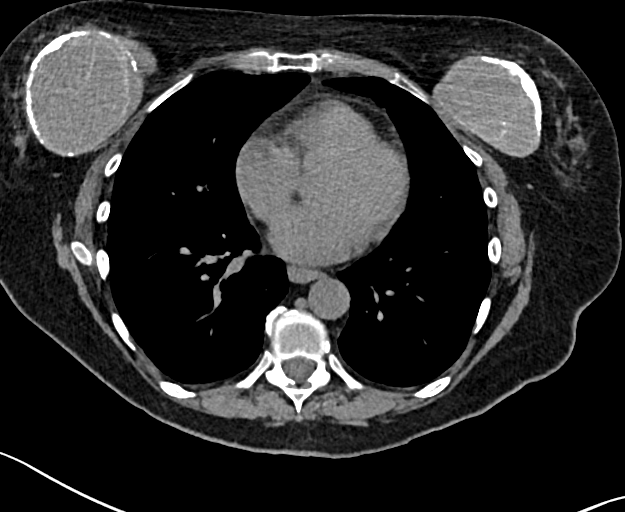
[im 72/156  lung]
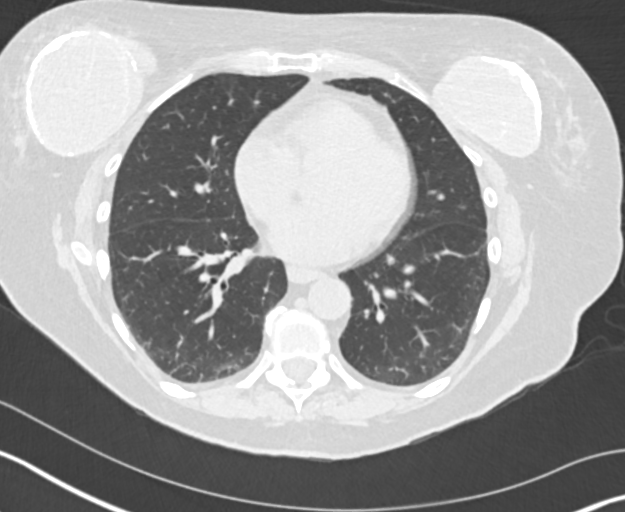
[im 84/156  lung]
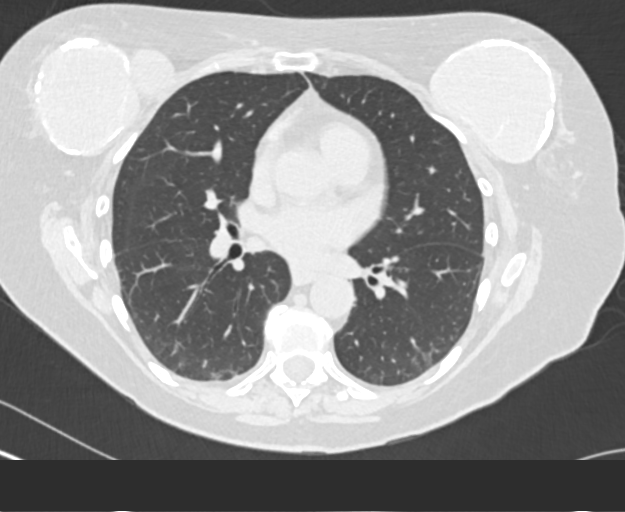
[im 96/156  lung]
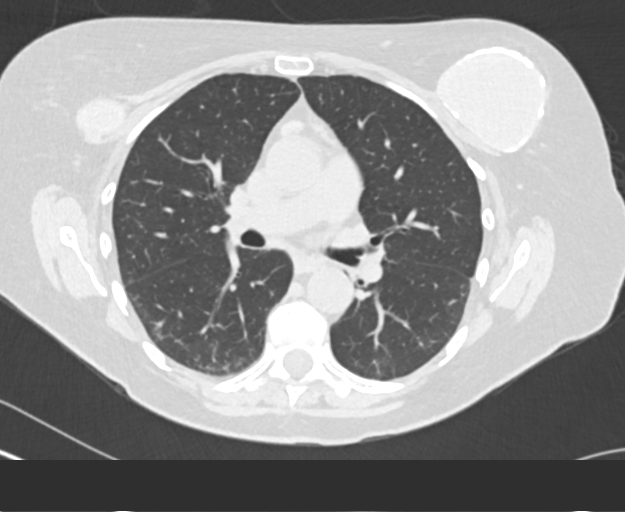
[im 120/156  lung]
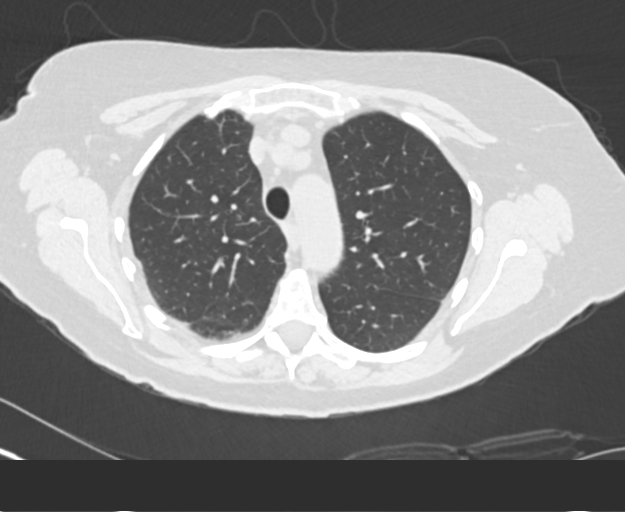
[im 132/156  mediastinal]
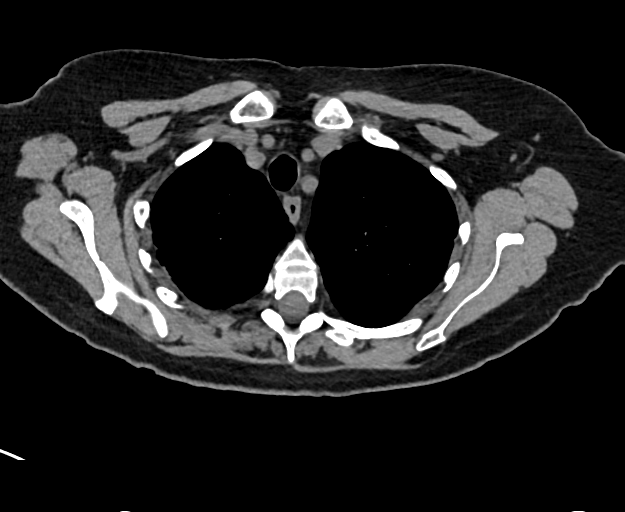
[im 132/156  lung]
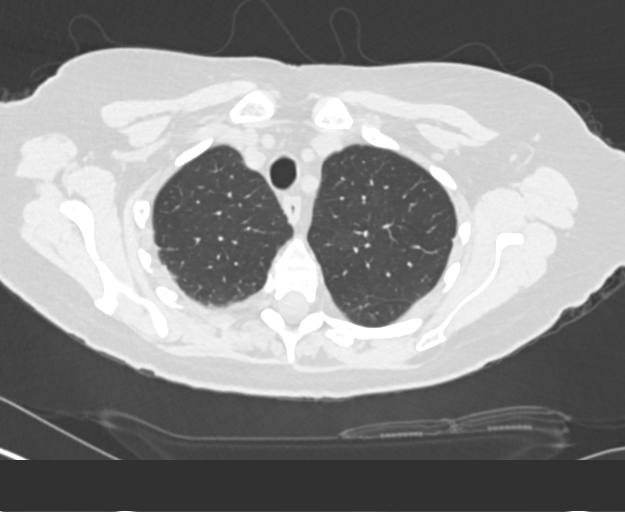
[im 144/156  lung]
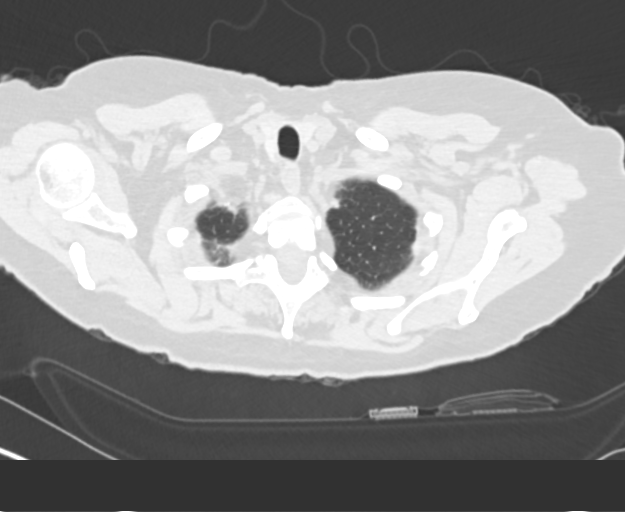

[Series 4: chest 2.00 br40 s3 cor · coronal · 0.61mm/px · 3 of 150 slices shown]
[im 30/150  lung]
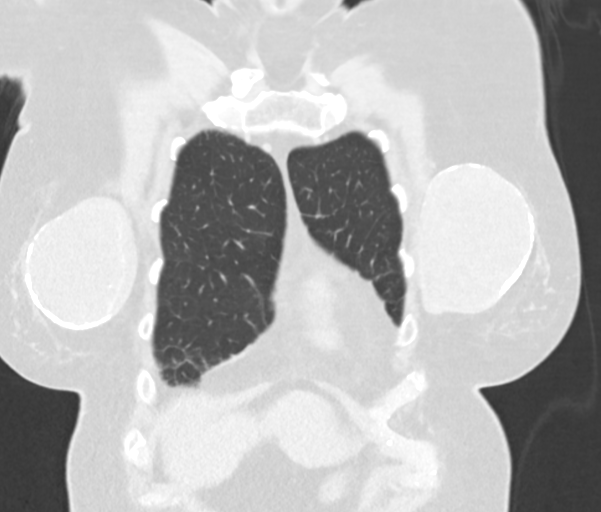
[im 60/150  lung]
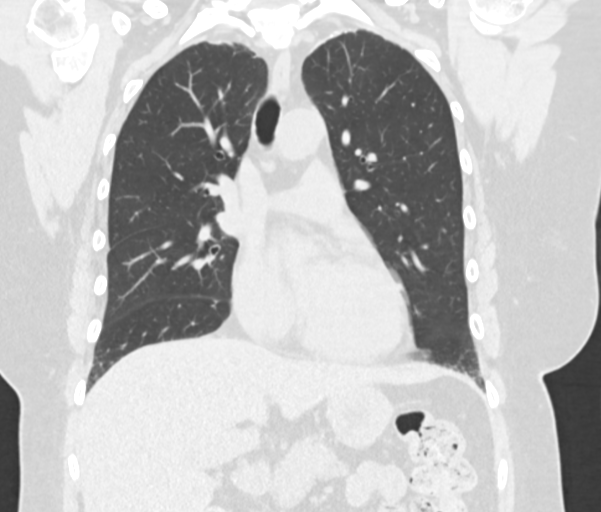
[im 90/150  lung]
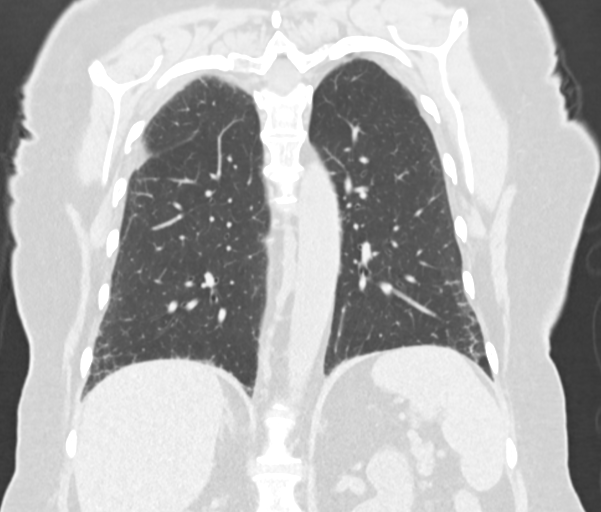

[13 of 36 positions shown; findings below may reference images not displayed]

FINDINGS: Cardiovascular: Aortic atherosclerosis. Normal heart size, without
pericardial effusion.

Mediastinum/Nodes: No supraclavicular adenopathy. No middle
mediastinal or hilar adenopathy. Tiny hiatal hernia.

Anterior mediastinal soft tissue density lesion measures 2.3 by
cm on image 68/2 versus 2.4 x 1.0 cm on the prior. 3.8 cm
craniocaudal on coronal image 38 today versus 5.1 cm at the same
level on the prior.

Lungs/Pleura: No pleural fluid.  Clear lungs.

Upper Abdomen: Normal imaged portions of the liver, spleen,
pancreas, adrenal glands, kidneys, gallbladder.

Musculoskeletal: Bilateral calcified breast implants. Similar
appearance of right-sided extracapsular rupture. No acute osseous
abnormality.
IMPRESSION: 1. Slight decrease in size of anterior mediastinal soft tissue
density, again favoring a benign etiology. Favor thymic hyperplasia.
2. Coronary artery atherosclerosis. Aortic Atherosclerosis
([id number]).
3.  Tiny hiatal hernia.

## 2019-05-15 DIAGNOSIS — Z85828 Personal history of other malignant neoplasm of skin: Secondary | ICD-10-CM | POA: Diagnosis not present

## 2019-05-15 DIAGNOSIS — D2261 Melanocytic nevi of right upper limb, including shoulder: Secondary | ICD-10-CM | POA: Diagnosis not present

## 2019-05-15 DIAGNOSIS — I788 Other diseases of capillaries: Secondary | ICD-10-CM | POA: Diagnosis not present

## 2019-05-15 DIAGNOSIS — L650 Telogen effluvium: Secondary | ICD-10-CM | POA: Diagnosis not present

## 2019-05-15 DIAGNOSIS — L649 Androgenic alopecia, unspecified: Secondary | ICD-10-CM | POA: Diagnosis not present

## 2019-05-15 DIAGNOSIS — D2272 Melanocytic nevi of left lower limb, including hip: Secondary | ICD-10-CM | POA: Diagnosis not present

## 2019-05-15 DIAGNOSIS — M3390 Dermatopolymyositis, unspecified, organ involvement unspecified: Secondary | ICD-10-CM | POA: Diagnosis not present

## 2019-05-15 DIAGNOSIS — D2262 Melanocytic nevi of left upper limb, including shoulder: Secondary | ICD-10-CM | POA: Diagnosis not present

## 2019-05-15 DIAGNOSIS — D2271 Melanocytic nevi of right lower limb, including hip: Secondary | ICD-10-CM | POA: Diagnosis not present

## 2019-05-23 DIAGNOSIS — Z124 Encounter for screening for malignant neoplasm of cervix: Secondary | ICD-10-CM | POA: Diagnosis not present

## 2019-05-23 DIAGNOSIS — Z6828 Body mass index (BMI) 28.0-28.9, adult: Secondary | ICD-10-CM | POA: Diagnosis not present

## 2019-05-27 ENCOUNTER — Encounter: Payer: Self-pay | Admitting: Rheumatology

## 2019-05-27 DIAGNOSIS — M3501 Sicca syndrome with keratoconjunctivitis: Secondary | ICD-10-CM

## 2019-05-29 MED ORDER — HYDROXYCHLOROQUINE SULFATE 200 MG PO TABS: 200.0000 mg | ORAL_TABLET | Freq: Two times a day (BID) | ORAL | 0 refills | Status: DC

## 2019-05-29 MED ORDER — FOLIC ACID 1 MG PO TABS: 3.0000 mg | ORAL_TABLET | Freq: Every day | ORAL | 3 refills | Status: DC

## 2019-05-29 NOTE — Telephone Encounter (Signed)
Last Visit: 03/17/2019 telemedicine  Next Visit: 06/23/2019 Labs: 03/27/19 WNL PLQ Eye Exam: 10/10/2018 WNL  Okay to refill per Dr. Estanislado Pandy

## 2019-06-16 NOTE — Progress Notes (Deleted)
Office Visit Note  Patient: Marisa Orozco             Date of Birth: 1951/01/29           MRN: [id number]             PCP: Caryl Bis, MD Referring: Caryl Bis, MD Visit Date: 06/23/2019 Occupation: @GUAROCC @  Subjective:  No chief complaint on file.   History of Present Illness: Marisa Orozco is a 69 y.o. female ***   Activities of Daily Living:  Patient reports morning stiffness for *** {minute/hour:19697}.   Patient {ACTIONS;DENIES/REPORTS:21021675::"Denies"} nocturnal pain.  Difficulty dressing/grooming: {ACTIONS;DENIES/REPORTS:21021675::"Denies"} Difficulty climbing stairs: {ACTIONS;DENIES/REPORTS:21021675::"Denies"} Difficulty getting out of chair: {ACTIONS;DENIES/REPORTS:21021675::"Denies"} Difficulty using hands for taps, buttons, cutlery, and/or writing: {ACTIONS;DENIES/REPORTS:21021675::"Denies"}  No Rheumatology ROS completed.   PMFS History:  Patient Active Problem List   Diagnosis Date Noted  . Primary osteoarthritis of right knee 02/03/2018  . Primary osteoarthritis of both hands 09/08/2017  . High risk medication use 04/07/2016  . Sjogren's disease ([place]) 02/04/2016  . Dermatomyositis ([place]) 02/04/2016  . Former smoker 02/04/2016  . 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 Fatigue 02/15/2012    Past Medical History:  Diagnosis Date  . Allergy   . Anxiety   . Cancer ([place])    basal cell carcinoma - face  . Dermatomyositis ([place]) 02/04/2016   Jo 1 Positive  Skin Bx- Subtle interface Dermatitis   . Former smoker 02/04/2016  . GERD (gastroesophageal reflux disease)    otc med prn  . PONV (postoperative nausea and vomiting)   . Sjogren's disease ([place]) 02/04/2016   Positive ANA, Positive Ro, Positive CCP, Negative RF, Parotitis, Positive Sicca  . Sjogren's syndrome ([place])    from MD note    Family History  Problem Relation Age of Onset  . COPD Mother   . Heart disease Mother   . Diabetes Father   . Kidney disease Father   . Epilepsy Daughter   . Rectal  cancer Maternal Uncle   . Esophageal cancer Neg Hx   . Stomach cancer Neg Hx   . Ulcerative colitis Neg Hx    Past Surgical History:  Procedure Laterality Date  . BREAST SURGERY     augmentation  . BUNIONECTOMY     right foot  . COLONOSCOPY    . DIAGNOSTIC LAPAROSCOPY    . DILATION AND CURETTAGE OF UTERUS  04/15/2011   Procedure: DILATATION AND CURETTAGE;  Surgeon: [name];  Location: [facility];  Service: Gynecology;  Laterality: N/A;  . MUSCLE BIOPSY Left 01/02/2016   Procedure: MUSCLE BIOPSY LEFT DELTOID;  Surgeon: [name], MD;  Location: [facility];  Service: General;  Laterality: Left;  left deltoid muscle bx  . NASAL SEPTUM SURGERY    . SVD     x 2  . TONSILLECTOMY    . WISDOM TOOTH EXTRACTION     Social History   Social History Narrative  . Not on file   Immunization History  Administered Date(s) Administered  . Influenza, High Dose Seasonal PF 01/07/2018  . Influenza-Unspecified 01/04/2014  . Pneumococcal Conjugate-13 01/05/2016  . Zoster Recombinat (Shingrix) 08/11/2017, 12/17/2017     Objective: Vital Signs: There were no vitals taken for this visit.   Physical Exam   Musculoskeletal Exam: ***  CDAI Exam: CDAI Score: -- Patient Global: --; Provider Global: -- Swollen: --; Tender: -- Joint Exam 06/23/2019   No joint exam has been documented for this visit   There is  currently no information documented on the homunculus. Go to the Rheumatology activity and complete the homunculus joint exam.  Investigation: No additional findings.  Imaging: No results found.  Recent Labs: Lab Results  Component Value Date   WBC 6.3 03/16/2019   HGB 14.6 03/16/2019   PLT 192 03/16/2019   NA 142 03/16/2019   K 3.9 03/16/2019   CL 106 03/16/2019   CO2 25 03/16/2019   GLUCOSE 85 03/16/2019   BUN 21 03/16/2019   CREATININE 0.82 03/16/2019   BILITOT 0.6 03/16/2019   ALKPHOS 44 10/12/2016   AST 18 03/16/2019   ALT 20 03/16/2019    PROT 7.1 03/16/2019   ALBUMIN 4.2 10/12/2016   CALCIUM 9.8 03/16/2019   GFRAA 85 03/16/2019   QFTBGOLDPLUS NEGATIVE 07/07/2017    Speciality Comments: PLQ Eye Exam: 10/10/2018 WNL @ [facility]. Follow up 1 year.  Procedures:  No procedures performed Allergies: Codeine, Flagyl [metronidazole hcl], Nexium [esomeprazole magnesium], Imuran [azathioprine], Sulfa antibiotics, and Sulfa drugs cross reactors   Assessment / Plan:     Visit Diagnoses: No diagnosis found.  Orders: No orders of the defined types were placed in this encounter.  No orders of the defined types were placed in this encounter.   Face-to-face time spent with patient was *** minutes. Greater than 50% of time was spent in counseling and coordination of care.  Follow-Up Instructions: No follow-ups on file.   Ofilia Neas, PA-C  Note - This record has been created using Dragon software.  Chart creation errors have been sought, but may not always  have been located. Such creation errors do not reflect on  the standard of medical care.

## 2019-06-18 ENCOUNTER — Other Ambulatory Visit: Payer: Self-pay | Admitting: Rheumatology

## 2019-06-19 ENCOUNTER — Other Ambulatory Visit: Payer: Self-pay | Admitting: *Deleted

## 2019-06-19 ENCOUNTER — Encounter: Payer: Self-pay | Admitting: Rheumatology

## 2019-06-19 DIAGNOSIS — Z79899 Other long term (current) drug therapy: Secondary | ICD-10-CM

## 2019-06-19 MED ORDER — METHOTREXATE SODIUM CHEMO INJECTION 50 MG/2ML: INTRAMUSCULAR | 0 refills | Status: DC

## 2019-06-19 NOTE — Telephone Encounter (Signed)
Last Visit: 03/17/2019 telemedicine  Next Visit: 06/23/19 Labs: 03/27/19 WNL  Okay to refill per Dr. Estanislado Pandy

## 2019-06-19 NOTE — Telephone Encounter (Signed)
Last Visit:03/17/2019 telemedicine Next Visit:06/23/19  Current Dose according to last office note on 03/16/20: prednisone5mg  daily.  Okay to refill per Dr. Estanislado Pandy

## 2019-06-19 NOTE — Progress Notes (Signed)
Office Visit Note  Patient: Marisa Orozco             Date of Birth: Dec 02, 1950           MRN: [id number]             PCP: Caryl Bis, MD Referring: Caryl Bis, MD Visit Date: 06/22/2019 Occupation: @GUAROCC @  Subjective:  Medication monotoring.   History of Present Illness: Marisa Orozco is a 69 y.o. female with history of dermatomyositis, Sjogren's and osteoarthritis.  She states she tried lowering her prednisone down to 3-1/2 mg p.o. daily but started having worsening of her sicca symptoms.  She increased the dose to 4.5 mg p.o. daily which has been working well.  She was also experiencing increased joint pain and she took some Advil at that time.  She has been on methotrexate and Plaquenil combination which she has not missed any doses.  Activities of Daily Living:  Patient reports morning stiffness for 0 minutes.   Patient Reports nocturnal pain.  Difficulty dressing/grooming: Denies Difficulty climbing stairs: Reports Difficulty getting out of chair: Denies Difficulty using hands for taps, buttons, cutlery, and/or writing: Denies  Review of Systems  Constitutional: Negative for fatigue, night sweats, weight gain and weight loss.  HENT: Positive for mouth dryness and nose dryness. Negative for mouth sores, trouble swallowing and trouble swallowing.   Eyes: Positive for dryness. Negative for pain, redness and visual disturbance.  Respiratory: Negative for cough, shortness of breath and difficulty breathing.   Cardiovascular: Negative for chest pain, palpitations, hypertension, irregular heartbeat and swelling in legs/feet.  Gastrointestinal: Negative for blood in stool, constipation and diarrhea.  Endocrine: Negative for increased urination.  Genitourinary: Negative for difficulty urinating, painful urination and vaginal dryness.  Musculoskeletal: Positive for arthralgias, joint pain, muscle weakness and muscle tenderness. Negative for joint swelling, myalgias,  morning stiffness and myalgias.  Skin: Positive for color change. Negative for rash, hair loss, skin tightness, ulcers and sensitivity to sunlight.  Allergic/Immunologic: Negative for susceptible to infections.  Neurological: Positive for numbness. Negative for dizziness, headaches, memory loss, night sweats and weakness.  Hematological: Positive for bruising/bleeding tendency. Negative for swollen glands.  Psychiatric/Behavioral: Negative for depressed mood, confusion and sleep disturbance. The patient is not nervous/anxious.     PMFS History:  Patient Active Problem List   Diagnosis Date Noted  . Primary osteoarthritis of right knee 02/03/2018  . Primary osteoarthritis of both hands 09/08/2017  . High risk medication use 04/07/2016  . Sjogren's disease ([place]) 02/04/2016  . Dermatomyositis ([place]) 02/04/2016  . Former smoker 02/04/2016  . 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 Fatigue 02/15/2012    Past Medical History:  Diagnosis Date  . Allergy   . Anxiety   . Cancer ([place])    basal cell carcinoma - face  . Dermatomyositis ([place]) 02/04/2016   Marisa Orozco 1 Positive  Skin Bx- Subtle interface Dermatitis   . Former smoker 02/04/2016  . GERD (gastroesophageal reflux disease)    otc med prn  . PONV (postoperative nausea and vomiting)   . Sjogren's disease ([place]) 02/04/2016   Positive ANA, Positive Ro, Positive CCP, Negative RF, Parotitis, Positive Sicca  . Sjogren's syndrome ([place])    from MD note    Family History  Problem Relation Age of Onset  . COPD Mother   . Heart disease Mother   . Diabetes Father   . Kidney disease Father   . Epilepsy Daughter   . Rectal cancer Maternal Uncle   .  Esophageal cancer Neg Hx   . Stomach cancer Neg Hx   . Ulcerative colitis Neg Hx    Past Surgical History:  Procedure Laterality Date  . BREAST SURGERY     augmentation  . BUNIONECTOMY     right foot  . COLONOSCOPY    . DIAGNOSTIC LAPAROSCOPY    . DILATION AND CURETTAGE OF UTERUS  04/15/2011   Procedure:  DILATATION AND CURETTAGE;  Surgeon: [name];  Location: [facility];  Service: Gynecology;  Laterality: N/A;  . MUSCLE BIOPSY Left 01/02/2016   Procedure: MUSCLE BIOPSY LEFT DELTOID;  Surgeon: [name], MD;  Location: [facility];  Service: General;  Laterality: Left;  left deltoid muscle bx  . NASAL SEPTUM SURGERY    . SVD     x 2  . TONSILLECTOMY    . WISDOM TOOTH EXTRACTION     Social History   Social History Narrative  . Not on file   Immunization History  Administered Date(s) Administered  . Influenza, High Dose Seasonal PF 01/07/2018  . Influenza-Unspecified 01/04/2014  . Pneumococcal Conjugate-13 01/05/2016  . Zoster Recombinat (Shingrix) 08/11/2017, 12/17/2017     Objective: Vital Signs: BP 121/67 (BP Location: Left Arm, Patient Position: Sitting, Cuff Size: Normal)   Pulse 72   Resp 13   Ht 5\' 6"  (1.676 m)   Wt 179 lb (81.2 kg)   BMI 28.89 kg/m    Physical Exam Vitals and nursing note reviewed.  Constitutional:      Appearance: She is well-developed.  HENT:     Head: Normocephalic and atraumatic.  Eyes:     Conjunctiva/sclera: Conjunctivae normal.  Cardiovascular:     Rate and Rhythm: Normal rate and regular rhythm.     Heart sounds: Normal heart sounds.  Pulmonary:     Effort: Pulmonary effort is normal.     Breath sounds: Normal breath sounds.  Abdominal:     General: Bowel sounds are normal.     Palpations: Abdomen is soft.  Musculoskeletal:     Cervical back: Normal range of motion.  Lymphadenopathy:     Cervical: No cervical adenopathy.  Skin:    General: Skin is warm and dry.     Capillary Refill: Capillary refill takes less than 2 seconds.  Neurological:     Mental Status: She is alert and oriented to person, place, and time.  Psychiatric:        Behavior: Behavior normal.      Musculoskeletal Exam: C-spine thoracic and lumbar spine were in good range of motion.  Shoulder joints, elbow joints, wrist joints with good  range of motion.  She had no MCP PIP or DIP synovitis.  Hip joints, knee joints, ankles MTPs PIPs with good range of motion with no synovitis.  CDAI Exam: CDAI Score: -- Patient Global: --; Provider Global: -- Swollen: --; Tender: -- Joint Exam 06/22/2019   No joint exam has been documented for this visit   There is currently no information documented on the homunculus. Go to the Rheumatology activity and complete the homunculus joint exam.  Investigation: No additional findings.  Imaging: No results found.  Recent Labs: Lab Results  Component Value Date   WBC 7.5 06/20/2019   HGB 14.6 06/20/2019   PLT 182 06/20/2019   NA 142 06/20/2019   K 4.0 06/20/2019   CL 105 06/20/2019   CO2 29 06/20/2019   GLUCOSE 83 06/20/2019   BUN 17 06/20/2019   CREATININE 0.75 06/20/2019  BILITOT 0.7 06/20/2019   ALKPHOS 44 10/12/2016   AST 39 (H) 06/20/2019   ALT 46 (H) 06/20/2019   PROT 6.9 06/20/2019   ALBUMIN 4.2 10/12/2016   CALCIUM 9.7 06/20/2019   GFRAA 95 06/20/2019   QFTBGOLDPLUS NEGATIVE 07/07/2017    Speciality Comments: PLQ Eye Exam: 10/10/2018 WNL @ [facility]. Follow up 1 year.  Procedures:  No procedures performed Allergies: Codeine, Flagyl [metronidazole hcl], Nexium [esomeprazole magnesium], Imuran [azathioprine], Sulfa antibiotics, and Sulfa drugs cross reactors   Assessment / Plan:     Visit Diagnoses: Dermatomyositis ([place]) - Marisa Orozco 1+, Positive muscle biopsy for dermatomyositis.  Patient has no muscular weakness or tenderness.  She reduced her prednisone from 4.5 mg to 3.5 mg which caused increased discomfort and sicca symptoms.  She increase prednisone back to 4.5 mg p.o. daily.  She will stay on the current dose.  She has had no recurrence of rash.  High risk medication use - Methotrexate 0.8 ml every 7 days, folic acid 1 mg 3 tablets daily, Plaquenil 200 mg 1 tablet twice daily, and prednisone 4.5 mg daily. Eye Exam: 10/10/2018.  Labs done in March were  stable except for mild elevation of LFTs.  We will check LFTs in a month.  Patient states she was taking some Advil which could have contributed to elevated LFTs.  Sjogren's syndrome with keratoconjunctivitis sicca (HCC)-she has been using over-the-counter products which has been helpful.  Primary osteoarthritis of both hands-joint protection was discussed.  Primary osteoarthritis of right knee-she is currently not having much discomfort.  Age-related osteoporosis without current pathological fracture - She is on Fosamax by her PCP.  Patient states she has not been taking Fosamax lately.  She has been taking calcium and vitamin D and exercising.  She is trying to see how she will do without Fosamax.  I advised her that she should be on Fosamax while she is on prednisone.  She will think about it.  History of vitamin D deficiency  Thymoma - Followed up at [facility].  Hair loss  Former smoker  Orders: Orders Placed This Encounter  Procedures  . AST  . ALT   No orders of the defined types were placed in this encounter.   Face-to-face time spent with patient was 30 minutes. Greater than 50% of time was spent in counseling and coordination of care.  Follow-Up Instructions: Return in about 3 months (around 09/22/2019) for DM, OA, Sjogren's.   Bo Merino, MD  Note - This record has been created using Editor, commissioning.  Chart creation errors have been sought, but may not always  have been located. Such creation errors do not reflect on  the standard of medical care.

## 2019-06-20 DIAGNOSIS — Z79899 Other long term (current) drug therapy: Secondary | ICD-10-CM | POA: Diagnosis not present

## 2019-06-20 LAB — CBC WITH DIFFERENTIAL/PLATELET
Absolute Monocytes: 533 cells/uL (ref 200–950)
Basophils Absolute: 38 cells/uL (ref 0–200)
Basophils Relative: 0.5 %
Eosinophils Absolute: 240 cells/uL (ref 15–500)
Eosinophils Relative: 3.2 %
HCT: 43.4 % (ref 35.0–45.0)
Hemoglobin: 14.6 g/dL (ref 11.7–15.5)
Lymphs Abs: 1860 cells/uL (ref 850–3900)
MCH: 31.3 pg (ref 27.0–33.0)
MCHC: 33.6 g/dL (ref 32.0–36.0)
MCV: 92.9 fL (ref 80.0–100.0)
MPV: 12.2 fL (ref 7.5–12.5)
Monocytes Relative: 7.1 %
Neutro Abs: 4830 cells/uL (ref 1500–7800)
Neutrophils Relative %: 64.4 %
Platelets: 182 10*3/uL (ref 140–400)
RBC: 4.67 10*6/uL (ref 3.80–5.10)
RDW: 13.2 % (ref 11.0–15.0)
Total Lymphocyte: 24.8 %
WBC: 7.5 10*3/uL (ref 3.8–10.8)

## 2019-06-20 LAB — COMPLETE METABOLIC PANEL WITH GFR
AG Ratio: 1.7 (calc) (ref 1.0–2.5)
ALT: 46 U/L — ABNORMAL HIGH (ref 6–29)
AST: 39 U/L — ABNORMAL HIGH (ref 10–35)
Albumin: 4.3 g/dL (ref 3.6–5.1)
Alkaline phosphatase (APISO): 62 U/L (ref 37–153)
BUN: 17 mg/dL (ref 7–25)
CO2: 29 mmol/L (ref 20–32)
Calcium: 9.7 mg/dL (ref 8.6–10.4)
Chloride: 105 mmol/L (ref 98–110)
Creat: 0.75 mg/dL (ref 0.50–0.99)
GFR, Est African American: 95 mL/min/{1.73_m2} (ref >=60)
GFR, Est Non African American: 82 mL/min/{1.73_m2} (ref >=60)
Globulin: 2.6 g/dL (calc) (ref 1.9–3.7)
Glucose, Bld: 83 mg/dL (ref 65–99)
Potassium: 4 mmol/L (ref 3.5–5.3)
Sodium: 142 mmol/L (ref 135–146)
Total Bilirubin: 0.7 mg/dL (ref 0.2–1.2)
Total Protein: 6.9 g/dL (ref 6.1–8.1)

## 2019-06-20 LAB — CK: Total CK: 62 U/L (ref 29–143)

## 2019-06-21 NOTE — Progress Notes (Signed)
CBC and CK are normal.  CMP shows elevated LFTs.  Please advise patient to avoid Tylenol, NSAIDs, alcohol intake. Repeat ALT and AST in 1 month.

## 2019-06-22 ENCOUNTER — Ambulatory Visit (INDEPENDENT_AMBULATORY_CARE_PROVIDER_SITE_OTHER): Payer: Medicare Other | Admitting: Rheumatology

## 2019-06-22 ENCOUNTER — Other Ambulatory Visit: Payer: Self-pay | Admitting: *Deleted

## 2019-06-22 ENCOUNTER — Other Ambulatory Visit: Payer: Self-pay

## 2019-06-22 ENCOUNTER — Encounter: Payer: Self-pay | Admitting: Rheumatology

## 2019-06-22 VITALS — BP 121/67 | HR 72 | Resp 13 | Ht 66.0 in | Wt 179.0 lb

## 2019-06-22 DIAGNOSIS — Z8639 Personal history of other endocrine, nutritional and metabolic disease: Secondary | ICD-10-CM

## 2019-06-22 DIAGNOSIS — M3390 Dermatopolymyositis, unspecified, organ involvement unspecified: Secondary | ICD-10-CM

## 2019-06-22 DIAGNOSIS — M19042 Primary osteoarthritis, left hand: Secondary | ICD-10-CM

## 2019-06-22 DIAGNOSIS — M3501 Sicca syndrome with keratoconjunctivitis: Secondary | ICD-10-CM

## 2019-06-22 DIAGNOSIS — D4989 Neoplasm of unspecified behavior of other specified sites: Secondary | ICD-10-CM

## 2019-06-22 DIAGNOSIS — Z87891 Personal history of nicotine dependence: Secondary | ICD-10-CM

## 2019-06-22 DIAGNOSIS — M1711 Unilateral primary osteoarthritis, right knee: Secondary | ICD-10-CM | POA: Diagnosis not present

## 2019-06-22 DIAGNOSIS — M810 Age-related osteoporosis without current pathological fracture: Secondary | ICD-10-CM

## 2019-06-22 DIAGNOSIS — L659 Nonscarring hair loss, unspecified: Secondary | ICD-10-CM

## 2019-06-22 DIAGNOSIS — Z79899 Other long term (current) drug therapy: Secondary | ICD-10-CM | POA: Diagnosis not present

## 2019-06-22 DIAGNOSIS — M19041 Primary osteoarthritis, right hand: Secondary | ICD-10-CM | POA: Diagnosis not present

## 2019-06-22 NOTE — Patient Instructions (Signed)
Standing Labs We placed an order today for your standing lab work.    Please come back and get your standing labs in I month and then every 3 months  We have open lab daily Monday through Thursday from 8:30-12:30 PM and 1:30-4:30 PM and Friday from 8:30-12:30 PM and 1:30-4:00 PM at the office of Dr. Bo Merino.   You may experience shorter wait times on Monday and Friday afternoons. The office is located at [address], [place], [place], [place] [address] No appointment is necessary.   Labs are drawn by [facility].  You may receive a bill from [facility] for your lab work.  If you wish to have your labs drawn at another location, please call the office 24 hours in advance to send orders.  If you have any questions regarding directions or hours of operation,  please call [phone].   Just as a reminder please drink plenty of water prior to coming for your lab work. Thanks!

## 2019-06-23 ENCOUNTER — Ambulatory Visit: Payer: Medicare Other | Admitting: Physician Assistant

## 2019-07-18 ENCOUNTER — Other Ambulatory Visit: Payer: Self-pay | Admitting: "Endocrinology

## 2019-07-21 ENCOUNTER — Encounter: Payer: Self-pay | Admitting: Rheumatology

## 2019-07-21 ENCOUNTER — Other Ambulatory Visit: Payer: Self-pay

## 2019-07-21 DIAGNOSIS — Z79899 Other long term (current) drug therapy: Secondary | ICD-10-CM

## 2019-07-24 ENCOUNTER — Other Ambulatory Visit: Payer: Self-pay | Admitting: "Endocrinology

## 2019-07-25 DIAGNOSIS — E039 Hypothyroidism, unspecified: Secondary | ICD-10-CM | POA: Diagnosis not present

## 2019-07-25 DIAGNOSIS — Z79899 Other long term (current) drug therapy: Secondary | ICD-10-CM | POA: Diagnosis not present

## 2019-07-25 LAB — ALT: ALT: 25 U/L (ref 6–29)

## 2019-07-25 LAB — AST: AST: 24 U/L (ref 10–35)

## 2019-07-26 ENCOUNTER — Other Ambulatory Visit: Payer: Self-pay

## 2019-07-26 ENCOUNTER — Encounter: Payer: Self-pay | Admitting: "Endocrinology

## 2019-07-26 ENCOUNTER — Ambulatory Visit: Payer: Medicare Other | Admitting: "Endocrinology

## 2019-07-26 ENCOUNTER — Ambulatory Visit (INDEPENDENT_AMBULATORY_CARE_PROVIDER_SITE_OTHER): Payer: Medicare Other | Admitting: "Endocrinology

## 2019-07-26 VITALS — BP 121/75 | HR 73 | Ht 66.0 in | Wt 176.4 lb

## 2019-07-26 DIAGNOSIS — E039 Hypothyroidism, unspecified: Secondary | ICD-10-CM | POA: Diagnosis not present

## 2019-07-26 LAB — T4, FREE: Free T4: 1.1 ng/dL (ref 0.8–1.8)

## 2019-07-26 LAB — THYROID PEROXIDASE ANTIBODY: Thyroperoxidase Ab SerPl-aCnc: <1 IU/mL (ref <9)

## 2019-07-26 LAB — THYROGLOBULIN ANTIBODY: Thyroglobulin Ab: <1 IU/mL (ref <=1)

## 2019-07-26 LAB — TSH: TSH: 2.43 mIU/L (ref 0.40–4.50)

## 2019-07-26 MED ORDER — LEVOTHYROXINE SODIUM 25 MCG PO TABS: ORAL_TABLET | ORAL | 1 refills | Status: DC

## 2019-07-26 NOTE — Progress Notes (Signed)
07/26/2019, 1:39 PM  Endocrinology follow-up note  Marisa Orozco is a 69 y.o.-year-old female patient being seen in follow-up for hypothyroidism. PMD:  Caryl Bis, MD.   Past Medical History:  Diagnosis Date  . Allergy   . Anxiety   . Cancer ([place])    basal cell carcinoma - face  . 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 Former smoker 02/04/2016  . GERD (gastroesophageal reflux disease)    otc med prn  . PONV (postoperative nausea and vomiting)   . Sjogren's disease ([place]) 02/04/2016   Positive ANA, Positive Ro, Positive CCP, Negative RF, Parotitis, Positive Sicca  . Sjogren's syndrome ([place])    from MD note    Past Surgical History:  Procedure Laterality Date  . BREAST SURGERY     augmentation  . BUNIONECTOMY     right foot  . COLONOSCOPY    . DIAGNOSTIC LAPAROSCOPY    . DILATION AND CURETTAGE OF UTERUS  04/15/2011   Procedure: DILATATION AND CURETTAGE;  Surgeon: [name];  Location: [facility];  Service: Gynecology;  Laterality: N/A;  . MUSCLE BIOPSY Left 01/02/2016   Procedure: MUSCLE BIOPSY LEFT DELTOID;  Surgeon: [name], MD;  Location: [facility];  Service: General;  Laterality: Left;  left deltoid muscle bx  . NASAL SEPTUM SURGERY    . SVD     x 2  . TONSILLECTOMY    . WISDOM TOOTH EXTRACTION      Social History   Socioeconomic History  . Marital status: Married    Spouse name: Not on file  . Number of children: Not on file  . Years of education: Not on file  . Highest education level: Not on file  Occupational History  . Not on file  Tobacco Use  . Smoking status: Former Smoker    Packs/day: 1.00    Years: 25.00    Pack years: 25.00    Types: Cigarettes    Quit date: 08/05/2003    Years since quitting: 15.9  . Smokeless tobacco: Never Used  Substance and  Sexual Activity  . Alcohol use: Yes    Comment: socially - wine  . Drug use: No  . Sexual activity: Yes    Birth control/protection: None  Other Topics Concern  . Not on file  Social History Narrative  . Not on file   Social Determinants of Health   Financial Resource Strain:   . Difficulty of Paying Living Expenses:   Food Insecurity:   . Worried About Charity fundraiser in the Last Year:   . Arboriculturist in the Last Year:   Transportation Needs:   . Film/video editor (Medical):   Marland Kitchen Lack of Transportation (Non-Medical):   Physical Activity:   . Days  of Exercise per Week:   . Minutes of Exercise per Session:   Stress:   . Feeling of Stress :   Social Connections:   . Frequency of Communication with Friends and Family:   . Frequency of Social Gatherings with Friends and Family:   . Attends Religious Services:   . Active Member of Clubs or Organizations:   . Attends Archivist Meetings:   Marland Kitchen Marital Status:     Family History  Problem Relation Age of Onset  . COPD Mother   . Heart disease Mother   . Diabetes Father   . Kidney disease Father   . Epilepsy Daughter   . Rectal cancer Maternal Uncle   . Esophageal cancer Neg Hx   . Stomach cancer Neg Hx   . Ulcerative colitis Neg Hx     Outpatient Encounter Medications as of 07/26/2019  Medication Sig  . predniSONE (DELTASONE) 1 MG tablet Take 1 mg by mouth daily with breakfast. Take 4mg  alternating with 4.5mg  every other day  . Ascorbic Acid (VITAMIN C PO) Take by mouth daily.  . Calcium Citrate-Vitamin D (CALCIUM CITRATE+D3 PO) Take by mouth daily.  . Cholecalciferol (VITAMIN D3) 1000 UNIT/SPRAY LIQD Take 5 sprays by mouth daily.   . Coenzyme Q10 (COQ10) 100 MG CAPS Take by mouth every morning.  . folic acid (FOLVITE) 1 MG tablet Take 3 tablets (3 mg total) by mouth daily.  . hydroxychloroquine (PLAQUENIL) 200 MG tablet Take 1 tablet (200 mg total) by mouth 2 (two) times daily.  Marland Kitchen levothyroxine  (SYNTHROID) 25 MCG tablet TAKE 1 TABLET BY MOUTH DAILY BEFORE BREAKFAST.  . methotrexate 50 MG/2ML injection INJECT 0.8ML INTO THE SKIN ONCE A WEEK  . Multiple Vitamins-Minerals (MULTIVITAMIN ADULT PO) Take 1 tablet by mouth daily.   Marland Kitchen OVER THE COUNTER MEDICATION Burberine, one capsule daily.  Marland Kitchen OVER THE COUNTER MEDICATION   . OVER THE COUNTER MEDICATION Osteo Sheath  . TUBERCULIN SYR 1CC/27GX1/2" 27G X 1/2" 1 ML MISC Patient to use weekly to inject Methotrexate  . TURMERIC PO Take 1 capsule by mouth daily.   Marland Kitchen VITAMIN K PO Take 1 tablet by mouth daily.   . [DISCONTINUED] alendronate (FOSAMAX) 70 MG tablet Take 70 mg by mouth once a week. Take with a full glass of water on an empty stomach.  . [DISCONTINUED] BIOTIN PO Take 8 mg by mouth daily.  . [DISCONTINUED] levothyroxine (SYNTHROID) 25 MCG tablet TAKE 1 TABLET BY MOUTH DAILY BEFORE BREAKFAST.  . [DISCONTINUED] predniSONE (DELTASONE) 1 MG tablet TAKE 5 TABLETS BY MOUTH DAILY WITH BREAKFAST. (Patient taking differently: Take 4.5 mg by mouth daily with breakfast. 4mg  alternating with 4.5mg  every other day)   No facility-administered encounter medications on file as of 07/26/2019.    ALLERGIES: Allergies  Allergen Reactions  . Codeine Nausea And Vomiting  . Flagyl [Metronidazole Hcl] Other (See Comments)    Reaction muscle weakness  . Nexium [Esomeprazole Magnesium] Other (See Comments)    Reaction Muscle weakness  . Imuran [Azathioprine] Other (See Comments)    Joint Pain  . Sulfa Antibiotics Rash  . Sulfa Drugs Cross Reactors Rash   VACCINATION STATUS: Immunization History  Administered Date(s) Administered  . Influenza, High Dose Seasonal PF 01/07/2018  . Influenza-Unspecified 01/04/2014  . Pneumococcal Conjugate-13 01/05/2016  . Zoster Recombinat (Shingrix) 08/11/2017, 12/17/2017     HPI    Marisa Orozco  is a patient with the above medical history.  She is returning for follow-up with  repeat thyroid function test.   She was initiated on low-dose levothyroxine during her last visit, levothyroxine 25 mcg p.o. daily before breakfast.  She will continue to benefit from this medication.  She has lost 9 pounds of weight.  She has no new complaints today.     She denies heat/cold intolerance.  She denies dysphagia, shortness of breath, no voice change.  She has family history of thyroid dysfunction of what appears to be hypothyroidism in her mother.  -She has a medical history of Sjogren's syndrome for which she is on methotrexate, Plaquenil.  She is also on on and off prednisone treatment.  Pt denies feeling nodules in neck, No family history of thyroid cancer.  No history of  radiation therapy to head or neck. No recent use of iodine supplements.    ROS:  Limited as above.   Physical Exam: BP 121/75   Pulse 73   Ht 5\' 6"  (1.676 m)   Wt 176 lb 6.4 oz (80 kg)   BMI 28.47 kg/m  Wt Readings from Last 3 Encounters:  07/26/19 176 lb 6.4 oz (80 kg)  06/22/19 179 lb (81.2 kg)  04/26/19 180 lb (81.6 kg)    Physical Exam- Limited  Constitutional:  Body mass index is 28.47 kg/m. , not in acute distress, normal state of mind Eyes:  EOMI, no exophthalmos Neck: Supple Thyroid: No gross goiter Respiratory: Adequate breathing efforts Musculoskeletal: no gross deformities, strength intact in all four extremities, no gross restriction of joint movements Skin:  no rashes, no hyperemia Neurological: no tremor with outstretched hands,    CMP ( most recent) CMP     Component Value Date/Time   NA 142 06/20/2019 0811   NA 137 01/16/2016 0000   K 4.0 06/20/2019 0811   CL 105 06/20/2019 0811   CO2 29 06/20/2019 0811   GLUCOSE 83 06/20/2019 0811   BUN 17 06/20/2019 0811   BUN 11 01/16/2016 0000   CREATININE 0.75 06/20/2019 0811   CALCIUM 9.7 06/20/2019 0811   PROT 6.9 06/20/2019 0811   ALBUMIN 4.2 10/12/2016 1202   AST 24 07/25/2019 1037   ALT 25 07/25/2019 1037   ALKPHOS 44 10/12/2016 1202    BILITOT 0.7 06/20/2019 0811   GFRNONAA 82 06/20/2019 0811   GFRAA 95 06/20/2019 0811    Lab Results  Component Value Date   TSH 2.43 07/25/2019   TSH 4.89 03/25/2018   TSH 4.18 03/25/2018   TSH 1.16 09/01/2016   TSH 0.87 02/18/2016   FREET4 1.1 07/25/2019       ASSESSMENT: 1. Hypothyroidism  PLAN:   -Her previsit labs are not suggestive of Hashimoto's thyroiditis.  However, her labs are consistent with treatment effect and response.  She will continue to benefit from levothyroxine supplement.  She is advised to continue 25 mcg p.o. daily before breakfast.     - We discussed about the correct intake of her thyroid hormone, on empty stomach at fasting, with water, separated by at least 30 minutes from breakfast and other medications,  and separated by more than 4 hours from calcium, iron, multivitamins, acid reflux medications (PPIs). -Patient is made aware of the fact that thyroid hormone replacement is needed for life, dose to be adjusted by periodic monitoring of thyroid function tests.   -Due to absence of clinical goiter, no need for thyroid ultrasound.   - Time spent on this patient care encounter:  20 minutes of which 50% was spent in  counseling and the rest  reviewing  her current and  previous labs / studies and medications  doses and developing a plan for long term care. [name]  participated in the discussions, expressed understanding, and voiced agreement with the above plans.  All questions were answered to her satisfaction. she is encouraged to contact clinic should she have any questions or concerns prior to her return visit.   Return in about 5 months (around 12/26/2019) for Follow up with Pre-visit Labs.  Glade Lloyd, MD [facility] [address] [place], [place] [address] Phone: [phone]  Fax: [phone]   07/26/2019, 1:39 PM  This note was partially dictated with voice recognition software.  Similar sounding words can be transcribed inadequately or may not  be corrected upon review.

## 2019-08-03 ENCOUNTER — Encounter: Payer: Self-pay | Admitting: Rheumatology

## 2019-08-03 DIAGNOSIS — Z79899 Other long term (current) drug therapy: Secondary | ICD-10-CM

## 2019-08-04 DIAGNOSIS — Z79899 Other long term (current) drug therapy: Secondary | ICD-10-CM | POA: Diagnosis not present

## 2019-08-05 LAB — CK: Total CK: 63 U/L (ref 29–143)

## 2019-08-07 NOTE — Telephone Encounter (Signed)
CK is normal and stable.

## 2019-09-03 ENCOUNTER — Other Ambulatory Visit: Payer: Self-pay | Admitting: Rheumatology

## 2019-09-03 DIAGNOSIS — M3501 Sicca syndrome with keratoconjunctivitis: Secondary | ICD-10-CM

## 2019-09-05 MED ORDER — HYDROXYCHLOROQUINE SULFATE 200 MG PO TABS: 200.0000 mg | ORAL_TABLET | Freq: Two times a day (BID) | ORAL | 0 refills | Status: DC

## 2019-09-05 NOTE — Telephone Encounter (Signed)
Last Visit: 06/22/2019 Next Visit: 09/20/2019 Labs: 06/20/2019 CBC and CK are normal. CMP shows elevated LFTs. 07/25/2019 LFTs WNL Eye exam:  10/10/2018 WNL   Current Dose per office note 06/22/2019: Plaquenil 200 mg 1 tablet twice daily  Okay to refill per Dr. Estanislado Pandy

## 2019-09-07 NOTE — Progress Notes (Signed)
Office Visit Note  Patient: Marisa Orozco             Date of Birth: 02-May-1950           MRN: [id number]             PCP: Caryl Bis, MD Referring: Caryl Bis, MD Visit Date: 09/20/2019 Occupation: @GUAROCC @  Subjective:  Medication management.   History of Present Illness: Marisa Orozco is a 69 y.o. female with history of dermatomyositis.  She states she is unable to taper prednisone below 4.5 mg.  She has been alternating prednisone between 4.5 mg with 4 mg every other day.  She states she would like to decrease Plaquenil.  She denies any joint pain or joint swelling.  She has some discomfort climbing stairs.  She denies any increased muscle weakness or muscle pain.  She has no difficulty getting up from the squatting position.  She denies any rash.  She states she took Fosamax for about 6 months and then stopped it.  She does not like taking too many medications.  She has been trying to exercise on a regular basis.  She is walking 3 miles daily.  Activities of Daily Living:  Patient reports morning stiffness for 0 minute.   Patient Denies nocturnal pain.  Difficulty dressing/grooming: Denies Difficulty climbing stairs: Reports Difficulty getting out of chair: Denies Difficulty using hands for taps, buttons, cutlery, and/or writing: Denies  Review of Systems  Constitutional: Negative for fatigue, night sweats, weight gain and weight loss.  HENT: Positive for mouth dryness. Negative for mouth sores, trouble swallowing, trouble swallowing and nose dryness.   Eyes: Negative for pain, redness, visual disturbance and dryness.  Respiratory: Negative for cough, shortness of breath and difficulty breathing.   Cardiovascular: Negative for chest pain, palpitations, hypertension, irregular heartbeat and swelling in legs/feet.  Gastrointestinal: Negative for blood in stool, constipation and diarrhea.  Endocrine: Negative for increased urination.  Genitourinary: Negative for  vaginal dryness.  Musculoskeletal: Negative for arthralgias, joint pain, joint swelling, myalgias, muscle weakness, morning stiffness, muscle tenderness and myalgias.  Skin: Negative for color change, rash, hair loss, skin tightness, ulcers and sensitivity to sunlight.  Allergic/Immunologic: Negative for susceptible to infections.  Neurological: Negative for dizziness, memory loss, night sweats and weakness.  Hematological: Negative for swollen glands.  Psychiatric/Behavioral: Positive for sleep disturbance. Negative for depressed mood. The patient is not nervous/anxious.     PMFS History:  Patient Active Problem List   Diagnosis Date Noted  . Hypothyroidism 07/26/2019  . Primary osteoarthritis of right knee 02/03/2018  . Primary osteoarthritis of both hands 09/08/2017  . High risk medication use 04/07/2016  . Sjogren's disease ([place]) 02/04/2016  . 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 Former smoker 02/04/2016  . 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 Fatigue 02/15/2012    Past Medical History:  Diagnosis Date  . Allergy   . Anxiety   . Cancer ([place])    basal cell carcinoma - face  . 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 Former smoker 02/04/2016  . GERD (gastroesophageal reflux disease)    otc med prn  . PONV (postoperative nausea and vomiting)   . Sjogren's disease ([place]) 02/04/2016   Positive ANA, Positive Ro, Positive CCP, Negative RF, Parotitis, Positive Sicca  . Sjogren's syndrome ([place])    from MD note    Family History  Problem Relation Age of Onset  . COPD Mother   .  Heart disease Mother   . Diabetes Father   . Kidney disease Father   . Epilepsy Daughter   . Rectal cancer Maternal Uncle   . Esophageal cancer Neg Hx   . Stomach cancer Neg Hx   . Ulcerative colitis Neg Hx    Past Surgical History:  Procedure Laterality Date  . BREAST SURGERY     augmentation  . BUNIONECTOMY     right foot  . COLONOSCOPY    . DIAGNOSTIC  LAPAROSCOPY    . DILATION AND CURETTAGE OF UTERUS  04/15/2011   Procedure: DILATATION AND CURETTAGE;  Surgeon: [name];  Location: [facility];  Service: Gynecology;  Laterality: N/A;  . MUSCLE BIOPSY Left 01/02/2016   Procedure: MUSCLE BIOPSY LEFT DELTOID;  Surgeon: [name], MD;  Location: [facility];  Service: General;  Laterality: Left;  left deltoid muscle bx  . NASAL SEPTUM SURGERY    . SVD     x 2  . TONSILLECTOMY    . WISDOM TOOTH EXTRACTION     Social History   Social History Narrative  . Not on file   Immunization History  Administered Date(s) Administered  . Influenza, High Dose Seasonal PF 01/07/2018  . Influenza-Unspecified 01/04/2014  . Pneumococcal Conjugate-13 01/05/2016  . Zoster Recombinat (Shingrix) 08/11/2017, 12/17/2017     Objective: Vital Signs: BP 129/69 (BP Location: Left Arm, Patient Position: Sitting, Cuff Size: Small)   Pulse 72   Resp 12   Ht 5\' 6"  (1.676 m)   Wt 180 lb 12.8 oz (82 kg)   BMI 29.18 kg/m    Physical Exam Vitals and nursing note reviewed.  Constitutional:      Appearance: She is well-developed.  HENT:     Head: Normocephalic and atraumatic.  Eyes:     Conjunctiva/sclera: Conjunctivae normal.  Cardiovascular:     Rate and Rhythm: Normal rate and regular rhythm.     Heart sounds: Normal heart sounds.  Pulmonary:     Effort: Pulmonary effort is normal.     Breath sounds: Normal breath sounds.  Abdominal:     General: Bowel sounds are normal.     Palpations: Abdomen is soft.  Musculoskeletal:     Cervical back: Normal range of motion.  Lymphadenopathy:     Cervical: No cervical adenopathy.  Skin:    General: Skin is warm and dry.     Capillary Refill: Capillary refill takes less than 2 seconds.  Neurological:     Mental Status: She is alert and oriented to person, place, and time.  Psychiatric:        Behavior: Behavior normal.      Musculoskeletal Exam: C-spine, thoracic and lumbar spine with  good range of motion.  Shoulder joints, elbow joints, wrist joints, MCPs PIPs and DIPs with good range of motion with no synovitis.  Hip joints, knee joints, ankles, MTPs and PIPs with good range of motion with no synovitis.  She had no muscular weakness or tenderness.  She was able to get up from the squatting position without any difficulty.  CDAI Exam: CDAI Score: -- Patient Global: --; Provider Global: -- Swollen: --; Tender: -- Joint Exam 09/20/2019   No joint exam has been documented for this visit   There is currently no information documented on the homunculus. Go to the Rheumatology activity and complete the homunculus joint exam.  Investigation: No additional findings.  Imaging: No results found.  Recent Labs: Lab Results  Component Value Date  WBC 7.6 09/15/2019   HGB 14.2 09/15/2019   PLT 191 09/15/2019   NA 141 09/15/2019   K 3.8 09/15/2019   CL 106 09/15/2019   CO2 30 09/15/2019   GLUCOSE 91 09/15/2019   BUN 23 09/15/2019   CREATININE 0.83 09/15/2019   BILITOT 0.6 09/15/2019   ALKPHOS 44 10/12/2016   AST 16 09/15/2019   ALT 18 09/15/2019   PROT 6.5 09/15/2019   ALBUMIN 4.2 10/12/2016   CALCIUM 9.3 09/15/2019   GFRAA 84 09/15/2019   QFTBGOLDPLUS NEGATIVE 07/07/2017   September 15, 2019 CK 44 Speciality Comments: PLQ Eye Exam: 10/10/2018 WNL @ [facility]. Follow up 1 year.  Procedures:  No procedures performed Allergies: Codeine, Flagyl [metronidazole hcl], Nexium [esomeprazole magnesium], Imuran [azathioprine], Sulfa antibiotics, and Sulfa drugs cross reactors   Assessment / Plan:     Visit Diagnoses: Dermatomyositis ([place]) - Jo 1+, Positive muscle biopsy for dermatomyositis.  She is currently on methotrexate 0.8 mL subcu weekly along with Plaquenil 200 mg twice daily and prednisone 4.5 mg alternating with 4 mg every other day.  She has not had any flares.  She does not want to taper her prednisone further but would like to decrease Plaquenil.  We  discussed decreasing Plaquenil to once a day for a month and if she does well she can try every other day for a month and then she can try stopping it.  If she develops any flare with rash or increased muscle weakness then she will have to resume Plaquenil.  She was in agreement.  High risk medication use - Methotrexate 0.8 ml every 7 days, folic acid 1 mg 3 tablets daily, Plaquenil 200 mg 1 tablet twice daily, and prednisone 4.5 mg daily. Eye Exam: 10/10/2018.  Her CK has been stable and in the desirable range.  Sjogren's syndrome with keratoconjunctivitis sicca (H[facility])-she continues to have sicca symptoms for which she has been using over-the-counter products.  Primary osteoarthritis of both hands-joint protection muscle strengthening was discussed.  Primary osteoarthritis of right knee-she still has some discomfort climbing stairs.  Age-related osteoporosis without current pathological fracture - She is taking fosamax prescribed by her PCP.  We reviewed her bone density from June 02, 2018 she had a T score of -2.7 and a decrease of BMD by -15.2%.  She states she took Fosamax for 6 months and decided to come off the medication.  She would like to have repeat bone density in 2022 before she would like to go back on any medications.  I had discussion with the patient that we recommend bisphosphonates with the use of steroids.  She understands the increased risk of worsening of osteoporosis and fracture.  History of vitamin D deficiency-she is taking a supplement.  Thymoma  Hair loss  Former smoker    Orders: No orders of the defined types were placed in this encounter.  No orders of the defined types were placed in this encounter.    Follow-Up Instructions: Return in about 3 months (around 12/21/2019) for Dermatomyositis.   Bo Merino, MD  Note - This record has been created using Editor, commissioning.  Chart creation errors have been sought, but may not always  have been located.  Such creation errors do not reflect on  the standard of medical care.

## 2019-09-11 ENCOUNTER — Other Ambulatory Visit: Payer: Self-pay | Admitting: Rheumatology

## 2019-09-11 NOTE — Telephone Encounter (Signed)
Last Visit: 06/22/2019 Next Visit: 09/20/2019 Labs: 06/20/2019 CBC and CK are normal. CMP shows elevated LFTs. 07/25/2019 LFTs WNL  Current Dose per office note on 06/22/2019: Methotrexate 0.8 ml every 7 days and prednisone  4.5 mg p.o. daily  Left message to remind patient she is due to update labs this month.   Okay to refill per Dr. Estanislado Pandy

## 2019-09-14 ENCOUNTER — Encounter: Payer: Self-pay | Admitting: Rheumatology

## 2019-09-14 ENCOUNTER — Other Ambulatory Visit: Payer: Self-pay | Admitting: *Deleted

## 2019-09-14 DIAGNOSIS — D2271 Melanocytic nevi of right lower limb, including hip: Secondary | ICD-10-CM | POA: Diagnosis not present

## 2019-09-14 DIAGNOSIS — Z79899 Other long term (current) drug therapy: Secondary | ICD-10-CM

## 2019-09-14 DIAGNOSIS — Z85828 Personal history of other malignant neoplasm of skin: Secondary | ICD-10-CM | POA: Diagnosis not present

## 2019-09-14 DIAGNOSIS — M3390 Dermatopolymyositis, unspecified, organ involvement unspecified: Secondary | ICD-10-CM | POA: Diagnosis not present

## 2019-09-14 DIAGNOSIS — D2272 Melanocytic nevi of left lower limb, including hip: Secondary | ICD-10-CM | POA: Diagnosis not present

## 2019-09-14 DIAGNOSIS — L720 Epidermal cyst: Secondary | ICD-10-CM | POA: Diagnosis not present

## 2019-09-14 DIAGNOSIS — Q825 Congenital non-neoplastic nevus: Secondary | ICD-10-CM | POA: Diagnosis not present

## 2019-09-15 LAB — CBC WITH DIFFERENTIAL/PLATELET
Absolute Monocytes: 570 cells/uL (ref 200–950)
Basophils Absolute: 53 cells/uL (ref 0–200)
Basophils Relative: 0.7 %
Eosinophils Absolute: 532 cells/uL — ABNORMAL HIGH (ref 15–500)
Eosinophils Relative: 7 %
HCT: 42.3 % (ref 35.0–45.0)
Hemoglobin: 14.2 g/dL (ref 11.7–15.5)
Lymphs Abs: 2227 cells/uL (ref 850–3900)
MCH: 31.7 pg (ref 27.0–33.0)
MCHC: 33.6 g/dL (ref 32.0–36.0)
MCV: 94.4 fL (ref 80.0–100.0)
MPV: 12.2 fL (ref 7.5–12.5)
Monocytes Relative: 7.5 %
Neutro Abs: 4218 cells/uL (ref 1500–7800)
Neutrophils Relative %: 55.5 %
Platelets: 191 10*3/uL (ref 140–400)
RBC: 4.48 10*6/uL (ref 3.80–5.10)
RDW: 13.1 % (ref 11.0–15.0)
Total Lymphocyte: 29.3 %
WBC: 7.6 10*3/uL (ref 3.8–10.8)

## 2019-09-15 LAB — COMPLETE METABOLIC PANEL WITH GFR
AG Ratio: 2.1 (calc) (ref 1.0–2.5)
ALT: 18 U/L (ref 6–29)
AST: 16 U/L (ref 10–35)
Albumin: 4.4 g/dL (ref 3.6–5.1)
Alkaline phosphatase (APISO): 64 U/L (ref 37–153)
BUN: 23 mg/dL (ref 7–25)
CO2: 30 mmol/L (ref 20–32)
Calcium: 9.3 mg/dL (ref 8.6–10.4)
Chloride: 106 mmol/L (ref 98–110)
Creat: 0.83 mg/dL (ref 0.50–0.99)
GFR, Est African American: 84 mL/min/{1.73_m2} (ref >=60)
GFR, Est Non African American: 72 mL/min/{1.73_m2} (ref >=60)
Globulin: 2.1 g/dL (calc) (ref 1.9–3.7)
Glucose, Bld: 91 mg/dL (ref 65–99)
Potassium: 3.8 mmol/L (ref 3.5–5.3)
Sodium: 141 mmol/L (ref 135–146)
Total Bilirubin: 0.6 mg/dL (ref 0.2–1.2)
Total Protein: 6.5 g/dL (ref 6.1–8.1)

## 2019-09-15 LAB — CK: Total CK: 44 U/L (ref 29–143)

## 2019-09-15 NOTE — Progress Notes (Signed)
All labs are WNLs

## 2019-09-20 ENCOUNTER — Other Ambulatory Visit: Payer: Self-pay

## 2019-09-20 ENCOUNTER — Ambulatory Visit (INDEPENDENT_AMBULATORY_CARE_PROVIDER_SITE_OTHER): Payer: Medicare Other | Admitting: Rheumatology

## 2019-09-20 ENCOUNTER — Encounter: Payer: Self-pay | Admitting: Rheumatology

## 2019-09-20 ENCOUNTER — Ambulatory Visit: Payer: Medicare Other | Admitting: Rheumatology

## 2019-09-20 VITALS — BP 129/69 | HR 72 | Resp 12 | Ht 66.0 in | Wt 180.8 lb

## 2019-09-20 DIAGNOSIS — Z79899 Other long term (current) drug therapy: Secondary | ICD-10-CM

## 2019-09-20 DIAGNOSIS — M19041 Primary osteoarthritis, right hand: Secondary | ICD-10-CM

## 2019-09-20 DIAGNOSIS — L659 Nonscarring hair loss, unspecified: Secondary | ICD-10-CM

## 2019-09-20 DIAGNOSIS — Z8639 Personal history of other endocrine, nutritional and metabolic disease: Secondary | ICD-10-CM | POA: Diagnosis not present

## 2019-09-20 DIAGNOSIS — Z87891 Personal history of nicotine dependence: Secondary | ICD-10-CM

## 2019-09-20 DIAGNOSIS — M19042 Primary osteoarthritis, left hand: Secondary | ICD-10-CM

## 2019-09-20 DIAGNOSIS — M3390 Dermatopolymyositis, unspecified, organ involvement unspecified: Secondary | ICD-10-CM

## 2019-09-20 DIAGNOSIS — D4989 Neoplasm of unspecified behavior of other specified sites: Secondary | ICD-10-CM | POA: Diagnosis not present

## 2019-09-20 DIAGNOSIS — M810 Age-related osteoporosis without current pathological fracture: Secondary | ICD-10-CM | POA: Diagnosis not present

## 2019-09-20 DIAGNOSIS — M1711 Unilateral primary osteoarthritis, right knee: Secondary | ICD-10-CM | POA: Diagnosis not present

## 2019-09-20 DIAGNOSIS — M3501 Sicca syndrome with keratoconjunctivitis: Secondary | ICD-10-CM

## 2019-10-13 ENCOUNTER — Encounter: Payer: Self-pay | Admitting: Rheumatology

## 2019-10-13 ENCOUNTER — Other Ambulatory Visit: Payer: Self-pay

## 2019-10-13 DIAGNOSIS — Z79899 Other long term (current) drug therapy: Secondary | ICD-10-CM

## 2019-10-14 DIAGNOSIS — Z1231 Encounter for screening mammogram for malignant neoplasm of breast: Secondary | ICD-10-CM | POA: Diagnosis not present

## 2019-10-16 DIAGNOSIS — Z79899 Other long term (current) drug therapy: Secondary | ICD-10-CM | POA: Diagnosis not present

## 2019-10-16 NOTE — Progress Notes (Signed)
Office Visit Note  Patient: Marisa Orozco             Date of Birth: January 07, 1951           MRN: [id number]             PCP: Caryl Bis, MD Referring: Caryl Bis, MD Visit Date: 10/17/2019 Occupation: @GUAROCC @  Subjective:  Other (muscle pain, tenderness, headache )   History of Present Illness: Marisa Orozco is a 69 y.o. female with history of dermatomyositis and osteoarthritis.  She states she has been having increased pain and discomfort in her neck and trapezius area.  She is also having increased headaches.  She believes that her dermatomyositis may be coming back as her CK was slightly elevated the last time.  She increased her Plaquenil to twice daily dosing and also increase her prednisone to 5.5 mg p.o. daily.  She states she has been under a lot of stress as her daughter is having increased seizures.    Activities of Daily Living:  Patient reports morning stiffness for 0 minutes.   Patient Reports nocturnal pain.  Difficulty dressing/grooming: Denies Difficulty climbing stairs: Reports Difficulty getting out of chair: Denies Difficulty using hands for taps, buttons, cutlery, and/or writing: Denies  Review of Systems  Constitutional: Negative for fatigue, night sweats, weight gain and weight loss.  HENT: Positive for mouth dryness and nose dryness. Negative for mouth sores, trouble swallowing and trouble swallowing.   Eyes: Negative for pain, redness, itching, visual disturbance and dryness.  Respiratory: Negative for cough, shortness of breath and difficulty breathing.   Cardiovascular: Negative for chest pain, palpitations, hypertension, irregular heartbeat and swelling in legs/feet.  Gastrointestinal: Negative for blood in stool, constipation and diarrhea.  Endocrine: Negative for increased urination.  Genitourinary: Negative for difficulty urinating and vaginal dryness.  Musculoskeletal: Positive for myalgias, muscle tenderness and myalgias. Negative for  arthralgias, joint pain, joint swelling, muscle weakness and morning stiffness.  Skin: Negative for color change, rash, hair loss, redness, skin tightness, ulcers and sensitivity to sunlight.  Allergic/Immunologic: Negative for susceptible to infections.  Neurological: Positive for headaches. Negative for dizziness, numbness, memory loss, night sweats and weakness.  Hematological: Negative for bruising/bleeding tendency and swollen glands.  Psychiatric/Behavioral: Positive for sleep disturbance. Negative for depressed mood and confusion. The patient is not nervous/anxious.     PMFS History:  Patient Active Problem List   Diagnosis Date Noted  . Hypothyroidism 07/26/2019  . Primary osteoarthritis of right knee 02/03/2018  . Primary osteoarthritis of both hands 09/08/2017  . High risk medication use 04/07/2016  . Sjogren's disease ([place]) 02/04/2016  . Dermatomyositis ([place]) 02/04/2016  . Former smoker 02/04/2016  . 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 Fatigue 02/15/2012    Past Medical History:  Diagnosis Date  . Allergy   . Anxiety   . Cancer ([place])    basal cell carcinoma - face  . Dermatomyositis ([place]) 02/04/2016   Jo 1 Positive  Skin Bx- Subtle interface Dermatitis   . Former smoker 02/04/2016  . GERD (gastroesophageal reflux disease)    otc med prn  . PONV (postoperative nausea and vomiting)   . Sjogren's disease ([place]) 02/04/2016   Positive ANA, Positive Ro, Positive CCP, Negative RF, Parotitis, Positive Sicca  . Sjogren's syndrome ([place])    from MD note    Family History  Problem Relation Age of Onset  . COPD Mother   . Heart disease Mother   . Diabetes Father   . Kidney  disease Father   . Epilepsy Daughter   . Rectal cancer Maternal Uncle   . Esophageal cancer Neg Hx   . Stomach cancer Neg Hx   . Ulcerative colitis Neg Hx    Past Surgical History:  Procedure Laterality Date  . BREAST SURGERY     augmentation  . BUNIONECTOMY     right foot  . COLONOSCOPY    . DIAGNOSTIC  LAPAROSCOPY    . DILATION AND CURETTAGE OF UTERUS  04/15/2011   Procedure: DILATATION AND CURETTAGE;  Surgeon: [name];  Location: [facility];  Service: Gynecology;  Laterality: N/A;  . MUSCLE BIOPSY Left 01/02/2016   Procedure: MUSCLE BIOPSY LEFT DELTOID;  Surgeon: [name], MD;  Location: [facility];  Service: General;  Laterality: Left;  left deltoid muscle bx  . NASAL SEPTUM SURGERY    . SVD     x 2  . TONSILLECTOMY    . WISDOM TOOTH EXTRACTION     Social History   Social History Narrative  . Not on file   Immunization History  Administered Date(s) Administered  . Influenza, High Dose Seasonal PF 01/07/2018  . Influenza-Unspecified 01/04/2014  . Pneumococcal Conjugate-13 01/05/2016  . Zoster Recombinat (Shingrix) 08/11/2017, 12/17/2017     Objective: Vital Signs: BP 122/76 (BP Location: Left Arm, Patient Position: Sitting, Cuff Size: Normal)   Pulse 75   Resp 15   Ht 5\' 6"  (1.676 m)   Wt 181 lb (82.1 kg)   BMI 29.21 kg/m    Physical Exam Vitals and nursing note reviewed.  Constitutional:      Appearance: She is well-developed.  HENT:     Head: Normocephalic and atraumatic.  Eyes:     Conjunctiva/sclera: Conjunctivae normal.  Cardiovascular:     Rate and Rhythm: Normal rate and regular rhythm.     Heart sounds: Normal heart sounds.  Pulmonary:     Effort: Pulmonary effort is normal.     Breath sounds: Normal breath sounds.  Abdominal:     General: Bowel sounds are normal.     Palpations: Abdomen is soft.  Musculoskeletal:     Cervical back: Normal range of motion.  Lymphadenopathy:     Cervical: No cervical adenopathy.  Skin:    General: Skin is warm and dry.     Capillary Refill: Capillary refill takes less than 2 seconds.  Neurological:     Mental Status: She is alert and oriented to person, place, and time.  Psychiatric:        Behavior: Behavior normal.      Musculoskeletal Exam: C-spine was in good range of motion.  She  had bilateral trapezius spasm.  Shoulder joints, elbow joints, wrist joints, MCPs PIPs DIPs been good range of motion with no synovitis.  Hip joints, knee joints, ankles, MTPs and PIPs with good range of motion with no synovitis.  CDAI Exam: CDAI Score: -- Patient Global: --; Provider Global: -- Swollen: --; Tender: -- Joint Exam 10/17/2019   No joint exam has been documented for this visit   There is currently no information documented on the homunculus. Go to the Rheumatology activity and complete the homunculus joint exam.  Investigation: No additional findings.  Imaging: No results found.  Recent Labs: Lab Results  Component Value Date   WBC 7.6 09/15/2019   HGB 14.2 09/15/2019   PLT 191 09/15/2019   NA 141 09/15/2019   K 3.8 09/15/2019   CL 106 09/15/2019   CO2 30 09/15/2019  GLUCOSE 91 09/15/2019   BUN 23 09/15/2019   CREATININE 0.83 09/15/2019   BILITOT 0.6 09/15/2019   ALKPHOS 44 10/12/2016   AST 16 09/15/2019   ALT 18 09/15/2019   PROT 6.5 09/15/2019   ALBUMIN 4.2 10/12/2016   CALCIUM 9.3 09/15/2019   GFRAA 84 09/15/2019   QFTBGOLDPLUS NEGATIVE 07/07/2017    Speciality Comments: PLQ Eye Exam: 10/10/2019 WNL @ [facility]. Follow up 1 year.  Procedures:  No procedures performed Allergies: Codeine, Flagyl [metronidazole hcl], Nexium [esomeprazole magnesium], Imuran [azathioprine], Sulfa antibiotics, and Sulfa drugs cross reactors   Assessment / Plan:     Visit Diagnoses: Dermatomyositis ([place]) - Jo 1+, Positive muscle biopsy for dermatomyositis.  Her most recent CK was 66.  Which is slightly increased than her previous CK.  I do not see any evidence of increased muscle weakness or tenderness.  Although patient believes that she might be having a flare of dermatomyositis.  She increased her prednisone to 12.5 mg and Plaquenil to twice daily dosing.  She describes discomfort mostly in the trapezius region.  High risk medication use - Methotrexate  0.8 ml every 7 days, folic acid 1 mg 3 tablets daily, Plaquenil 200 mg 1 tablet twice daily, and prednisone 4.5 mg daily.  Her labs have been stable.  Trapezius muscle spasm-she had bilateral trapezius spasm.  She has been under a lot of stress.  I have demonstrated some of the neck exercises.  Need for regular exercise and deep breathing exercises were discussed.  Sjogren's syndrome with keratoconjunctivitis sicca (HCC)-currently manageable.  Primary osteoarthritis of both hands-she has a stiffness.  Primary osteoarthritis of right knee-she has mild chronic discomfort.  Age-related osteoporosis without current pathological fracture-she is only on calcium, vitamin D and vitamin K.  She does not want to take any other medications.  History of vitamin D deficiency-she is on vitamin D supplement.  Thymoma  Hair loss  Former smoker  Orders: No orders of the defined types were placed in this encounter.  No orders of the defined types were placed in this encounter.    Follow-Up Instructions: Return in about 3 months (around 01/17/2020) for Dermatomyositis.   Bo Merino, MD  Note - This record has been created using Editor, commissioning.  Chart creation errors have been sought, but may not always  have been located. Such creation errors do not reflect on  the standard of medical care.

## 2019-10-16 NOTE — Telephone Encounter (Signed)
Patient is scheduled for 10/17/2019.

## 2019-10-17 ENCOUNTER — Ambulatory Visit (INDEPENDENT_AMBULATORY_CARE_PROVIDER_SITE_OTHER): Payer: Medicare Other | Admitting: Rheumatology

## 2019-10-17 ENCOUNTER — Encounter: Payer: Self-pay | Admitting: Rheumatology

## 2019-10-17 ENCOUNTER — Other Ambulatory Visit: Payer: Self-pay

## 2019-10-17 VITALS — BP 122/76 | HR 75 | Resp 15 | Ht 66.0 in | Wt 181.0 lb

## 2019-10-17 DIAGNOSIS — M810 Age-related osteoporosis without current pathological fracture: Secondary | ICD-10-CM

## 2019-10-17 DIAGNOSIS — D4989 Neoplasm of unspecified behavior of other specified sites: Secondary | ICD-10-CM

## 2019-10-17 DIAGNOSIS — M19041 Primary osteoarthritis, right hand: Secondary | ICD-10-CM

## 2019-10-17 DIAGNOSIS — Z8639 Personal history of other endocrine, nutritional and metabolic disease: Secondary | ICD-10-CM

## 2019-10-17 DIAGNOSIS — M19042 Primary osteoarthritis, left hand: Secondary | ICD-10-CM

## 2019-10-17 DIAGNOSIS — M3390 Dermatopolymyositis, unspecified, organ involvement unspecified: Secondary | ICD-10-CM

## 2019-10-17 DIAGNOSIS — M1711 Unilateral primary osteoarthritis, right knee: Secondary | ICD-10-CM

## 2019-10-17 DIAGNOSIS — M3501 Sicca syndrome with keratoconjunctivitis: Secondary | ICD-10-CM | POA: Diagnosis not present

## 2019-10-17 DIAGNOSIS — M62838 Other muscle spasm: Secondary | ICD-10-CM | POA: Diagnosis not present

## 2019-10-17 DIAGNOSIS — Z87891 Personal history of nicotine dependence: Secondary | ICD-10-CM

## 2019-10-17 DIAGNOSIS — Z79899 Other long term (current) drug therapy: Secondary | ICD-10-CM

## 2019-10-17 DIAGNOSIS — L659 Nonscarring hair loss, unspecified: Secondary | ICD-10-CM

## 2019-10-17 LAB — CK: Total CK: 66 U/L (ref 29–143)

## 2019-10-17 NOTE — Progress Notes (Signed)
CK is within normal limits.

## 2019-10-17 NOTE — Patient Instructions (Addendum)
Standing Labs We placed an order today for your standing lab work.   Please have your standing labs drawn in September and every 3 months  If possible, please have your labs drawn 2 weeks prior to your appointment so that the provider can discuss your results at your appointment.  We have open lab daily Monday through Thursday from 8:30-12:30 PM and 1:30-4:30 PM and Friday from 8:30-12:30 PM and 1:30-4:00 PM at the office of Dr. Bo Merino, [facility].   Please be advised, patients with office appointments requiring lab work will take precedents over walk-in lab work.  If possible, please come for your lab work on Monday and Friday afternoons, as you may experience shorter wait times. The office is located at [address] [place], [place], [place] [address] No appointment is necessary.   Labs are drawn by [facility]. Please bring your co-pay at the time of your lab draw.  You may receive a bill from [facility] for your lab work.  If you wish to have your labs drawn at another location, please call the office 24 hours in advance to send orders.  If you have any questions regarding directions or hours of operation,  please call [phone].   As a reminder, please drink plenty of water prior to coming for your lab work. Thanks!

## 2019-10-31 ENCOUNTER — Other Ambulatory Visit: Payer: Self-pay | Admitting: Rheumatology

## 2019-11-03 ENCOUNTER — Encounter: Payer: Self-pay | Admitting: Rheumatology

## 2019-11-03 NOTE — Telephone Encounter (Signed)
I returned patient's call and discussed that when the booster is available she should get it.  She was advised to postpone methotrexate for 1 week after the booster dose.  She may continue Plaquenil and prednisone.  She was also advised to continue to use mask, hand hygiene and social distancing.

## 2019-11-10 ENCOUNTER — Encounter: Payer: Self-pay | Admitting: Cardiology

## 2019-11-10 ENCOUNTER — Ambulatory Visit (INDEPENDENT_AMBULATORY_CARE_PROVIDER_SITE_OTHER): Payer: Medicare Other | Admitting: Cardiology

## 2019-11-10 ENCOUNTER — Other Ambulatory Visit: Payer: Self-pay

## 2019-11-10 VITALS — BP 120/70 | HR 82 | Ht 66.0 in | Wt 173.0 lb

## 2019-11-10 DIAGNOSIS — M3503 Sicca syndrome with myopathy: Secondary | ICD-10-CM | POA: Diagnosis not present

## 2019-11-10 DIAGNOSIS — R002 Palpitations: Secondary | ICD-10-CM | POA: Diagnosis not present

## 2019-11-10 DIAGNOSIS — M3390 Dermatopolymyositis, unspecified, organ involvement unspecified: Secondary | ICD-10-CM

## 2019-11-10 NOTE — Progress Notes (Signed)
Cardiology Office Note:    Date:  11/10/2019   ID:  Marisa Orozco, DOB 09/17/1950, MRN [id number]  PCP:  Marisa Bis, MD  [facility] Cardiologist:  No primary care provider on file.  [facility] Electrophysiologist:  None   Referring MD: Marisa Bis, MD    History of Present Illness:    Marisa Orozco is a 69 y.o. female with  Sjogren's syndrome with myopathy, dermatomyositis, remote tobacco smoking and palpitations seen for follow up.   Hx of palpitations/tachycardia, felt related to inflammatory response to dermatomyositis flare. Echo 12/2015 showed normal ejection fraction of 65-70% with grade 1 diastolic dysfunction.   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 There was low-grade activity on PET/CT. She has been followed by Dr. Roxan Orozco, last seen 10/11/2018 >> does not want surgical resection again. Advised to follow PRN.     Past Medical History:  Diagnosis Date  . Allergy   . Anxiety   . Cancer ([place])    basal cell carcinoma - face  . Dermatomyositis ([place]) 02/04/2016   Marisa Orozco 1 Positive  Skin Bx- Subtle interface Dermatitis   . Former smoker 02/04/2016  . GERD (gastroesophageal reflux disease)    otc med prn  . PONV (postoperative nausea and vomiting)   . Sjogren's disease ([place]) 02/04/2016   Positive ANA, Positive Ro, Positive CCP, Negative RF, Parotitis, Positive Sicca  . Sjogren's syndrome ([place])    from MD note    Past Surgical History:  Procedure Laterality Date  . BREAST SURGERY     augmentation  . BUNIONECTOMY     right foot  . COLONOSCOPY    . DIAGNOSTIC LAPAROSCOPY    . DILATION AND CURETTAGE OF UTERUS  04/15/2011   Procedure: DILATATION AND CURETTAGE;  Surgeon: Marisa Orozco;  Location: [facility];  Service: Gynecology;  Laterality: N/A;  . MUSCLE BIOPSY Left 01/02/2016   Procedure: MUSCLE BIOPSY LEFT DELTOID;  Surgeon: [name], MD;  Location: [facility];  Service: General;  Laterality: Left;   left deltoid muscle bx  . NASAL SEPTUM SURGERY    . SVD     x 2  . TONSILLECTOMY    . WISDOM TOOTH EXTRACTION      Current Medications: Current Meds  Medication Sig  . Ascorbic Acid (VITAMIN C PO) Take by mouth daily.  . Calcium Citrate-Vitamin D (CALCIUM CITRATE+D3 PO) Take by mouth daily.  . Cholecalciferol (VITAMIN D3) 1000 UNIT/SPRAY LIQD Take 5 sprays by mouth daily.   . Coenzyme Q10 (COQ10) 100 MG CAPS Take by mouth every morning.  . folic acid (FOLVITE) 1 MG tablet Take 3 tablets (3 mg total) by mouth daily. (Patient taking differently: Take 2 mg by mouth daily. )  . hydroxychloroquine (PLAQUENIL) 200 MG tablet Take 1 tablet (200 mg total) by mouth 2 (two) times daily.  . IBUPROFEN PO Take by mouth as needed.  Marland Kitchen levothyroxine (SYNTHROID) 25 MCG tablet TAKE 1 TABLET BY MOUTH DAILY BEFORE BREAKFAST.  . methotrexate 250 MG/10ML injection INJECT 0.8ML INTO THE SKIN ONCE A WEEK  . Multiple Vitamins-Minerals (MULTIVITAMIN ADULT PO) Take 1 tablet by mouth daily.   Marland Kitchen OVER THE COUNTER MEDICATION Burberine, one capsule daily.  Marland Kitchen OVER THE COUNTER MEDICATION Osteo Sheath   . predniSONE (DELTASONE) 1 MG tablet Take 4.5 tablets (4.5 mg total) by mouth daily with breakfast. (Patient taking differently: Take 5.5 mg by mouth daily with breakfast. Alternating with 5 mg every other  day.)  . TUBERCULIN SYR 1CC/27GX1/2" 27G X 1/2" 1 ML MISC Patient to use weekly to inject Methotrexate  . TURMERIC PO Take 1 capsule by mouth daily.   Marland Kitchen VITAMIN K PO Take 1 tablet by mouth daily.      Allergies:   Codeine, Flagyl [metronidazole hcl], Nexium [esomeprazole magnesium], Imuran [azathioprine], Sulfa antibiotics, and Sulfa drugs cross reactors   Social History   Socioeconomic History  . Marital status: Married    Spouse name: Not on file  . Number of children: Not on file  . Years of education: Not on file  . Highest education level: Not on file  Occupational History  . Not on file  Tobacco Use  .  Smoking status: Former Smoker    Packs/day: 1.00    Years: 25.00    Pack years: 25.00    Types: Cigarettes    Quit date: 08/05/2003    Years since quitting: 16.2  . Smokeless tobacco: Never Used  Vaping Use  . Vaping Use: Never used  Substance and Sexual Activity  . Alcohol use: Yes    Comment: socially - wine  . Drug use: No  . Sexual activity: Yes    Birth control/protection: None  Other Topics Concern  . Not on file  Social History Narrative  . Not on file   Social Determinants of Health   Financial Resource Strain:   . Difficulty of Paying Living Expenses:   Food Insecurity:   . Worried About Charity fundraiser in the Last Year:   . Arboriculturist in the Last Year:   Transportation Needs:   . Film/video editor (Medical):   Marland Kitchen Lack of Transportation (Non-Medical):   Physical Activity:   . Days of Exercise per Week:   . Minutes of Exercise per Session:   Stress:   . Feeling of Stress :   Social Connections:   . Frequency of Communication with Friends and Family:   . Frequency of Social Gatherings with Friends and Family:   . Attends Religious Services:   . Active Member of Clubs or Organizations:   . Attends Archivist Meetings:   Marland Kitchen Marital Status:      Family History: The patient's family history includes COPD in her mother; Diabetes in her father; Epilepsy in her daughter; Heart disease in her mother; Kidney disease in her father; Rectal cancer in her maternal uncle. There is no history of Esophageal cancer, Stomach cancer, or Ulcerative colitis.  ROS:   Please see the history of present illness.     All other systems reviewed and are negative.  EKGs/Labs/Other Studies Reviewed:    The following studies were reviewed today: Echocardiogram: 12/2015 Study Conclusions  - Left ventricle: The cavity size was normal. There was mild focal basal hypertrophy of the septum. Systolic function was vigorous. The estimated ejection fraction was in  the range of 65% to 70%. Wall motion was normal; there were no regional wall motion abnormalities. Doppler parameters are consistent with abnormal left ventricular relaxation (grade 1 diastolic dysfunction). Doppler parameters are consistent with elevated ventricular end-diastolic filling pressure. - Aortic valve: Trileaflet; normal thickness leaflets. Transvalvular velocity was within the normal range. There was no stenosis. There was no regurgitation. - Aortic root: The aortic root was normal in size. - Ascending aorta: The ascending aorta was normal in size. - Mitral valve: Structurally normal valve. There was no regurgitation. - Left atrium: The atrium was normal in size. - Right ventricle:  The cavity size was normal. Wall thickness was normal. Systolic function was normal. - Tricuspid valve: There was trivial regurgitation. - Pulmonic valve: There was no regurgitation. - Pulmonary arteries: The main pulmonary artery was normal-sized. Systolic pressure was within the normal range. - Inferior vena cava: The vessel was normal in size. - Pericardium, extracardiac: There was no pericardial effusion.   EKG:  EKG is  ordered today.  The ekg ordered today demonstrates sinus rhythm 82 no other abnormalities  Recent Labs: 07/25/2019: TSH 2.43 09/15/2019: ALT 18; BUN 23; Creat 0.83; Hemoglobin 14.2; Platelets 191; Potassium 3.8; Sodium 141  Recent Lipid Panel No results found for: CHOL, TRIG, HDL, CHOLHDL, VLDL, LDLCALC, LDLDIRECT  Physical Exam:    VS:  BP 120/70   Pulse 82   Ht 5\' 6"  (1.676 m)   Wt 173 lb (78.5 kg)   SpO2 98%   BMI 27.92 kg/m     Wt Readings from Last 3 Encounters:  11/10/19 173 lb (78.5 kg)  10/17/19 181 lb (82.1 kg)  09/20/19 180 lb 12.8 oz (82 kg)     GEN:  Well nourished, well developed in no acute distress HEENT: Normal NECK: No JVD; No carotid bruits LYMPHATICS: No lymphadenopathy CARDIAC: RRR, no murmurs, rubs,  gallops RESPIRATORY:  Clear to auscultation without rales, wheezing or rhonchi  ABDOMEN: Soft, non-tender, non-distended MUSCULOSKELETAL:  No edema; No deformity  SKIN: Warm and dry NEUROLOGIC:  Alert and oriented x 3 PSYCHIATRIC:  Normal affect   ASSESSMENT:    1. Palpitations   2. Sjogren's syndrome with myopathy ([place])   3. Dermatomyositis ([place])    PLAN:    In order of problems listed above:  Palpitations -Likely related to dermatomyositis flare.  Thankfully no recurrence, stable.  Anterior mediastinal "mass " -Has been stable for last several years.  Has declined multiple times surgical resection.  Dr. Ouida Sills recommended not to follow radiographically unless worsening symptoms  Dermatomyositis -Continue with prednisone dose 4.5 mg once a day.   Medication Adjustments/Labs and Tests Ordered: Current medicines are reviewed at length with the patient today.  Concerns regarding medicines are outlined above.  Orders Placed This Encounter  Procedures  . EKG 12-Lead   No orders of the defined types were placed in this encounter.   Patient Instructions  Medication Instructions:  The current medical regimen is effective;  continue present plan and medications.  *If you need a refill on your cardiac medications before your next appointment, please call your pharmacy*  Follow-Up: At [facility], you and your health needs are our priority.  As part of our continuing mission to provide you with exceptional heart care, we have created designated Provider Care Teams.  These Care Teams include your primary Cardiologist (physician) and Advanced Practice Providers (APPs -  Physician Assistants and Nurse Practitioners) who all work together to provide you with the care you need, when you need it.  We recommend signing up for the patient portal called "MyChart".  Sign up information is provided on this After Visit Summary.  MyChart is used to connect with patients for Virtual Visits  (Telemedicine).  Patients are able to view lab/test results, encounter notes, upcoming appointments, etc.  Non-urgent messages can be sent to your provider as well.   To learn more about what you can do with MyChart, go to [email or web address].    Your next appointment:   12 month(s)  The format for your next appointment:   In Person  Provider:   Candee Furbish, MD  Thank you for choosing [facility]!!        Signed, Candee Furbish, MD  11/10/2019 2:45 PM    [facility]

## 2019-11-10 NOTE — Patient Instructions (Signed)
Medication Instructions:  The current medical regimen is effective;  continue present plan and medications.  *If you need a refill on your cardiac medications before your next appointment, please call your pharmacy*  Follow-Up: At [facility], you and your health needs are our priority.  As part of our continuing mission to provide you with exceptional heart care, we have created designated Provider Care Teams.  These Care Teams include your primary Cardiologist (physician) and Advanced Practice Providers (APPs -  Physician Assistants and Nurse Practitioners) who all work together to provide you with the care you need, when you need it.  We recommend signing up for the patient portal called "MyChart".  Sign up information is provided on this After Visit Summary.  MyChart is used to connect with patients for Virtual Visits (Telemedicine).  Patients are able to view lab/test results, encounter notes, upcoming appointments, etc.  Non-urgent messages can be sent to your provider as well.   To learn more about what you can do with MyChart, go to [email or web address].    Your next appointment:   12 month(s)  The format for your next appointment:   In Person  Provider:   Mark Skains, MD   Thank you for choosing [facility]!!      

## 2019-11-20 ENCOUNTER — Other Ambulatory Visit: Payer: Self-pay | Admitting: Rheumatology

## 2019-11-21 ENCOUNTER — Other Ambulatory Visit: Payer: Self-pay | Admitting: *Deleted

## 2019-11-21 ENCOUNTER — Encounter: Payer: Self-pay | Admitting: Rheumatology

## 2019-11-21 DIAGNOSIS — Z79899 Other long term (current) drug therapy: Secondary | ICD-10-CM

## 2019-11-21 NOTE — Telephone Encounter (Signed)
Ok to place order for CK [facility].

## 2019-11-22 DIAGNOSIS — Z79899 Other long term (current) drug therapy: Secondary | ICD-10-CM | POA: Diagnosis not present

## 2019-11-23 LAB — CK: Total CK: 52 U/L (ref 29–143)

## 2019-11-23 NOTE — Progress Notes (Signed)
CK is normal.

## 2019-11-27 ENCOUNTER — Ambulatory Visit (INDEPENDENT_AMBULATORY_CARE_PROVIDER_SITE_OTHER): Payer: Medicare Other | Admitting: Physician Assistant

## 2019-11-27 ENCOUNTER — Encounter: Payer: Self-pay | Admitting: Physician Assistant

## 2019-11-27 ENCOUNTER — Other Ambulatory Visit: Payer: Self-pay

## 2019-11-27 ENCOUNTER — Ambulatory Visit (INDEPENDENT_AMBULATORY_CARE_PROVIDER_SITE_OTHER): Payer: Medicare Other

## 2019-11-27 VITALS — BP 130/84 | HR 77 | Resp 14 | Ht 66.0 in | Wt 182.2 lb

## 2019-11-27 DIAGNOSIS — M62838 Other muscle spasm: Secondary | ICD-10-CM | POA: Diagnosis not present

## 2019-11-27 DIAGNOSIS — M25562 Pain in left knee: Secondary | ICD-10-CM | POA: Diagnosis not present

## 2019-11-27 DIAGNOSIS — G8929 Other chronic pain: Secondary | ICD-10-CM

## 2019-11-27 DIAGNOSIS — M1711 Unilateral primary osteoarthritis, right knee: Secondary | ICD-10-CM

## 2019-11-27 DIAGNOSIS — M19041 Primary osteoarthritis, right hand: Secondary | ICD-10-CM | POA: Diagnosis not present

## 2019-11-27 DIAGNOSIS — M3501 Sicca syndrome with keratoconjunctivitis: Secondary | ICD-10-CM | POA: Diagnosis not present

## 2019-11-27 DIAGNOSIS — M19042 Primary osteoarthritis, left hand: Secondary | ICD-10-CM

## 2019-11-27 DIAGNOSIS — L659 Nonscarring hair loss, unspecified: Secondary | ICD-10-CM

## 2019-11-27 DIAGNOSIS — M3313 Other dermatomyositis without myopathy: Secondary | ICD-10-CM

## 2019-11-27 DIAGNOSIS — M810 Age-related osteoporosis without current pathological fracture: Secondary | ICD-10-CM | POA: Diagnosis not present

## 2019-11-27 DIAGNOSIS — Z8639 Personal history of other endocrine, nutritional and metabolic disease: Secondary | ICD-10-CM

## 2019-11-27 DIAGNOSIS — M3390 Dermatopolymyositis, unspecified, organ involvement unspecified: Secondary | ICD-10-CM

## 2019-11-27 DIAGNOSIS — Z87891 Personal history of nicotine dependence: Secondary | ICD-10-CM | POA: Diagnosis not present

## 2019-11-27 DIAGNOSIS — D4989 Neoplasm of unspecified behavior of other specified sites: Secondary | ICD-10-CM

## 2019-11-27 DIAGNOSIS — Z79899 Other long term (current) drug therapy: Secondary | ICD-10-CM | POA: Diagnosis not present

## 2019-11-27 NOTE — Progress Notes (Signed)
Office Visit Note  Patient: Marisa Orozco             Date of Birth: [birth year]           MRN: [id number]             PCP: Caryl Bis, MD Referring: Caryl Bis, MD Visit Date: 11/27/2019 Occupation: @GUAROCC @  Subjective:  Left knee joint pain   History of Present Illness: Marisa Orozco is a 69 y.o. female with history of dermatomyositis and osteoarthritis.  She is currently on MTX 0.8 ml sq injections once weekly, folic acid 2 mg po daily, PLQ 200 mg BID, and prednisone 4.5 mg alternating with 5 mg every other day.  She denies any signs or symptoms of a polymyositis flare. She is not having any muscle weakness or tenderness at this time.  She presents today with discomfort in the left knee joint.  She states her discomfort started about 1 week ago and improved with rest over several days.   She states on Saturday she was slinging her leg over the bed and she has started to have increased discomfort since then.  She denies any recent falls but she experiences a buckling sensation in the left knee joint.  She denies any joint swelling or warmth at this time. She has tried using heat, compression sleeve, and is using a cane to assist with ambulation.  She has tried taking tylenol/iburpforen for pain relief. She denies any other joint pain or joint swelling.   Activities of Daily Living:  Patient reports joint stiffness all day  Patient Denies nocturnal pain.  Difficulty dressing/grooming: Denies Difficulty climbing stairs: Reports Difficulty getting out of chair: Reports Difficulty using hands for taps, buttons, cutlery, and/or writing: Denies  Review of Systems  Constitutional: Negative for fatigue.  HENT: Positive for mouth dryness and nose dryness. Negative for mouth sores.   Eyes: Negative for itching and dryness.  Respiratory: Negative for shortness of breath and difficulty breathing.   Cardiovascular: Negative for chest pain and palpitations.  Gastrointestinal:  Negative for blood in stool, constipation and diarrhea.  Endocrine: Negative for increased urination.  Genitourinary: Negative for difficulty urinating.  Musculoskeletal: Positive for arthralgias, joint pain, myalgias, morning stiffness, muscle tenderness and myalgias. Negative for joint swelling.  Skin: Negative for color change, rash and redness.  Allergic/Immunologic: Negative for susceptible to infections.  Neurological: Negative for dizziness, numbness, headaches, memory loss and weakness.  Hematological: Positive for bruising/bleeding tendency.  Psychiatric/Behavioral: Negative for confusion.    PMFS History:  Patient Active Problem List   Diagnosis Date Noted  . Hypothyroidism 07/26/2019  . Primary osteoarthritis of right knee 02/03/2018  . Primary osteoarthritis of both hands 09/08/2017  . Sjogren's disease ([place]) 02/04/2016  . Dermatomyositis ([place]) 02/04/2016  . Former smoker 02/04/2016  . 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 Fatigue 02/15/2012    Past Medical History:  Diagnosis Date  . Allergy   . Anxiety   . Cancer ([name])    basal cell carcinoma - face  . Dermatomyositis ([place]) 02/04/2016   Jo 1 Positive  Skin Bx- Subtle interface Dermatitis   . Former smoker 02/04/2016  . GERD (gastroesophageal reflux disease)    otc med prn  . PONV (postoperative nausea and vomiting)   . Sjogren's disease ([place]) 02/04/2016   Positive ANA, Positive Ro, Positive CCP, Negative RF, Parotitis, Positive Sicca  . Sjogren's syndrome ([place])    from MD note    Family History  Problem Relation Age  of Onset  . COPD Mother   . Heart disease Mother   . Diabetes Father   . Kidney disease Father   . Epilepsy Daughter   . Rectal cancer Maternal Uncle   . Esophageal cancer Neg Hx   . Stomach cancer Neg Hx   . Ulcerative colitis Neg Hx    Past Surgical History:  Procedure Laterality Date  . BREAST SURGERY     augmentation  . BUNIONECTOMY     right foot  . COLONOSCOPY    . DIAGNOSTIC LAPAROSCOPY      . DILATION AND CURETTAGE OF UTERUS  04/15/2011   Procedure: DILATATION AND CURETTAGE;  Surgeon: [name];  Location: [facility];  Service: Gynecology;  Laterality: N/A;  . MUSCLE BIOPSY Left 01/02/2016   Procedure: MUSCLE BIOPSY LEFT DELTOID;  Surgeon: [name], MD;  Location: [facility];  Service: General;  Laterality: Left;  left deltoid muscle bx  . NASAL SEPTUM SURGERY    . SVD     x 2  . TONSILLECTOMY    . WISDOM TOOTH EXTRACTION     Social History   Social History Narrative  . Not on file   Immunization History  Administered Date(s) Administered  . Influenza, High Dose Seasonal PF 01/07/2018  . Influenza-Unspecified 01/04/2014  . PFIZER SARS-COV-2 Vaccination 04/26/2019, 05/17/2019  . Pneumococcal Conjugate-13 01/05/2016  . Zoster Recombinat (Shingrix) 08/11/2017, 12/17/2017     Objective: Vital Signs: BP 130/84 (BP Location: Left Arm, Patient Position: Sitting, Cuff Size: Normal)   Pulse 77   Resp 14   Ht 5\' 6"  (1.676 m)   Wt 182 lb 3.2 oz (82.6 kg)   BMI 29.41 kg/m    Physical Exam Vitals and nursing note reviewed.  Constitutional:      Appearance: She is well-developed.  HENT:     Head: Normocephalic and atraumatic.  Eyes:     Conjunctiva/sclera: Conjunctivae normal.  Pulmonary:     Effort: Pulmonary effort is normal.  Abdominal:     Palpations: Abdomen is soft.  Musculoskeletal:     Cervical back: Normal range of motion.  Skin:    General: Skin is warm and dry.     Capillary Refill: Capillary refill takes less than 2 seconds.  Neurological:     Mental Status: She is alert and oriented to person, place, and time.  Psychiatric:        Behavior: Behavior normal.      Musculoskeletal Exam: C-spine good ROM.  Shoulder joints, elbow joints, wrist joints, MCPs, PIPs, and DIPs good ROM with no synovitis.  Complete fist formation bilaterally.  Hip joints good ROM with no discomfort.  Painful ROM of the left knee joint.  No warmth or  effusion noted.  Right knee has good ROM with no warmth or effusion.  No knee crepitus noted.  Ankle joints good ROM with no tenderness or inflammation.    CDAI Exam: CDAI Score: -- Patient Global: --; Provider Global: -- Swollen: --; Tender: -- Joint Exam 11/27/2019   No joint exam has been documented for this visit   There is currently no information documented on the homunculus. Go to the Rheumatology activity and complete the homunculus joint exam.  Investigation: No additional findings.  Imaging: XR KNEE 3 VIEW LEFT  Result Date: 11/27/2019 Moderate medial compartment narrowing was noted.  Moderate patellofemoral narrowing was noted.  No chondrocalcinosis was noted. Impression: These findings are consistent moderate osteoarthritis and moderate chondromalacia patella.   Recent Labs:  Lab Results  Component Value Date   WBC 7.6 09/15/2019   HGB 14.2 09/15/2019   PLT 191 09/15/2019   NA 141 09/15/2019   K 3.8 09/15/2019   CL 106 09/15/2019   CO2 30 09/15/2019   GLUCOSE 91 09/15/2019   BUN 23 09/15/2019   CREATININE 0.83 09/15/2019   BILITOT 0.6 09/15/2019   ALKPHOS 44 10/12/2016   AST 16 09/15/2019   ALT 18 09/15/2019   PROT 6.5 09/15/2019   ALBUMIN 4.2 10/12/2016   CALCIUM 9.3 09/15/2019   GFRAA 84 09/15/2019   QFTBGOLDPLUS NEGATIVE 07/07/2017    Speciality Comments: PLQ Eye Exam: 10/10/2019 WNL @ [facility] Follow up 1 year.  Procedures:  No procedures performed Allergies: Codeine, Flagyl [metronidazole hcl], Nexium [esomeprazole magnesium], Imuran [azathioprine], Sulfa antibiotics, and Sulfa drugs cross reactors   Assessment / Plan:     Visit Diagnoses: Dermatomyositis ([place]) - Jo 1+, Positive muscle biopsy for dermatomyositis: She has not had any signs or symptoms of a dermatomyositis flare recently.  She is not experiencing any increased muscle weakness or muscle tenderness at this time.Her CK was 52 on 11/22/2019.  We will continue to monitor her  CK closely every 3 months.  She has not had any recent rashes.  She is currently on methotrexate 0.8 mL subcu days injections once weekly, folic acid, Plaquenil 160 mg 1 tablet by mouth twice daily, and prednisone as prescribed.  She will continue on the current treatment regimen.  She does not need any refills at this time.  She was advised to notify us if she develops any new or worsening symptoms.  She will follow-up in the office in September 2021 as scheduled.   High risk medication use - Methotrexate 0.8 ml every 7 days, folic acid 1 mg 3 tablets daily, Plaquenil 200 mg 1 tablet twice daily, and prednisone 4.5 mg alternating with 5 mg every other day.  CBC and CMP were drawn on 09/15/2019.  She will be due to update lab work in September and every 3 months to monitor for drug toxicity.  Standing orders for CBC and CMP are in place. She has received both COVID-19 vaccinations and was encouraged to receive the booster dose.  She was advised to hold methotrexate 1 week after receiving the booster dose.  She was advised to notify us or her PCP if she develops a COVID-19 infection in order to receive the antibody infusion.  She voiced understanding.  We discussed the importance of continue to wear a mask, social distance, and practice good hand hygiene.  Trapezius muscle spasm: She is not currently experiencing any trapezius muscle spasms or muscle tension.  Sjogren's syndrome with keratoconjunctivitis sicca ([place]): She has ongoing sicca symptoms especially mouth dryness and nose dryness.  Primary osteoarthritis of both hands: She has PIP and DIP thickening consistent with osteoarthritis of both hands.  She experiences intermittent discomfort and stiffness in both hands.  No synovitis was noted on exam.  Primary osteoarthritis of right knee: She is not experiencing any discomfort in her right knee joint at this time.  She is good range of motion on exam with no warmth or effusion.  Primary osteoarthritis  of left knee: She presents today with increased discomfort in the left knee joint.  Her discomfort started about 1 week ago and improved with rest after several days.  On Saturday she was lying in bed and swelling her leg to the side and has had persistent discomfort since then.  She has been experiencing a buckling sensation at times and has having difficulty ambulating due to the severity of pain.  She is using a cane to assist with ambulation.  She has tried heat, rest, compression, Tylenol/ibuprofen without much relief.  She has tenderness along the medial joint line.  No warmth or effusion was noted on exam.  No crepitus noted.  X-rays of the left knee joint were obtained today which revealed moderate osteoarthritis and moderate chondromalacia patella.  No chondrocalcinosis was noted.  X-rays were reviewed with the patient today in the office and questions were addressed.  Different treatment options were discussed.  She requested to have a left knee joint cortisone injection performed.  She tolerated the procedure well.  Procedure note was completed above.  Aftercare was discussed.  We discussed the use of Voltaren gel which she can apply topically up to 4 times daily as needed for pain relief.  We also discussed the use of a compression sleeve, rest, and ice.  She was encouraged to continue to use her cane to assist with ambulation until the discomfort has subsided.  She was advised to notify us if her symptoms persist or worsen and we will proceed with an MRI of the left knee joint.  Chronic pain of left knee -She presents today with increased discomfort in the left knee joint.  X-rays of the left knee were obtained which revealed moderate osteoarthritis and moderate chondromalacia patella.  The left knee joint was injected with cortisone as requested.  The procedure note was completed above.  Aftercare was discussed.  She was advised to notify us if her symptoms persist or worsen and we will proceed with  an MRI of the left knee.  Plan: XR KNEE 3 VIEW LEFT  Age-related osteoporosis without current pathological fracture: DEXA on 06/02/2018: AP spine T score -2.7 with BMD 0.95.  -15.2% change from previous DEXA.  She is on long-term prednisone.  She has not had any recent falls or fractures.  She is taking a calcium and vitamin D supplement as recommended.  She will continue to require DEXA every 2 years.  History of vitamin D deficiency: She is taking calcium and vitamin D supplement as recommended  Other medical conditions are listed as follows:   Hair loss  Thymoma  Former smoker  Orders: Orders Placed This Encounter  Procedures  . XR KNEE 3 VIEW LEFT   No orders of the defined types were placed in this encounter.   Face-to-face time spent with patient was 30 minutes. Greater than 50% of time was spent in counseling and coordination of care.  Follow-Up Instructions: Return for Dermatomyositis , Osteoarthritis.   Ofilia Neas, PA-C  Note - This record has been created using Dragon software.  Chart creation errors have been sought, but may not always  have been located. Such creation errors do not reflect on  the standard of medical care.

## 2019-11-30 ENCOUNTER — Telehealth: Payer: Self-pay

## 2019-11-30 ENCOUNTER — Other Ambulatory Visit: Payer: Self-pay | Admitting: Rheumatology

## 2019-11-30 NOTE — Telephone Encounter (Signed)
Attempted to contact patient and left message on machine to see how she is doing after her appointment on Monday afternoon. Advised patient to call the office.

## 2019-11-30 NOTE — Telephone Encounter (Signed)
Last Visit: 11/27/2019 Next Visit: 12/26/2019 Labs: 09/15/2019 All labs are WNLs  Current Dose per office note 11/27/2019: Methotrexate 0.8 ml every 7 days DX: Dermatomyositis   Okay to refill per Dr. Estanislado Pandy

## 2019-12-01 DIAGNOSIS — Z209 Contact with and (suspected) exposure to unspecified communicable disease: Secondary | ICD-10-CM | POA: Diagnosis not present

## 2019-12-11 ENCOUNTER — Other Ambulatory Visit: Payer: Self-pay | Admitting: Rheumatology

## 2019-12-11 DIAGNOSIS — M3501 Sicca syndrome with keratoconjunctivitis: Secondary | ICD-10-CM

## 2019-12-12 DIAGNOSIS — Z23 Encounter for immunization: Secondary | ICD-10-CM | POA: Diagnosis not present

## 2019-12-12 MED ORDER — HYDROXYCHLOROQUINE SULFATE 200 MG PO TABS: 200.0000 mg | ORAL_TABLET | Freq: Two times a day (BID) | ORAL | 0 refills | Status: DC

## 2019-12-12 NOTE — Telephone Encounter (Signed)
Last Visit: 11/27/2019 Next Visit: 12/26/2019 Labs: 09/15/2019 All labs are WNLs  PLQ Eye Exam: 10/10/2019 WNL   Current Dose per office note 11/27/2019: Plaquenil 200 mg 1 tablet twice daily DX: Dermatomyositis

## 2019-12-13 ENCOUNTER — Ambulatory Visit (INDEPENDENT_AMBULATORY_CARE_PROVIDER_SITE_OTHER): Payer: Medicare Other | Admitting: Physician Assistant

## 2019-12-13 ENCOUNTER — Encounter: Payer: Self-pay | Admitting: Physician Assistant

## 2019-12-13 VITALS — Ht 66.0 in | Wt 182.0 lb

## 2019-12-13 DIAGNOSIS — M25562 Pain in left knee: Secondary | ICD-10-CM | POA: Diagnosis not present

## 2019-12-13 NOTE — Progress Notes (Signed)
Office Visit Note   Patient: Marisa Orozco           Date of Birth: Aug 01, 1950           MRN: [id number] Visit Date: 12/13/2019              Requested by: Caryl Bis, MD [place],  [place] [id number] PCP: Caryl Bis, MD   Assessment & Plan: Visit Diagnoses:  1. Acute pain of left knee     Plan: Due to the fact the patient is failed conservative treatment given the acute onset of her knee pain and mainly just patellofemoral arthritic changes on radiographs recommend MRI.  MRIs will help meniscal tear and also evaluate the cartilage of her knee to aid in further treatment planning.  Follow-Up Instructions: Return AFTER MRI.   Orders:  No orders of the defined types were placed in this encounter.  No orders of the defined types were placed in this encounter.     Procedures: No procedures performed   Clinical Data: No additional findings.   Subjective: Chief Complaint  Patient presents with  . Left Knee - Pain    HPI Marisa Orozco is a 69 year old female seen for the first time for left knee pain.  She states that she has had no known injury.  She states that the knee felt "funny and unstable" while on the treadmill a few weeks ago.  She initially had significant welling.  Feels that the is weak but has had no true giving way no catching locking or painful popping.  She did undergo a cortisone injection in the 11/27/2019 which only helped for about 3 to 5 days and the pain is coming back.  Using a walking stick to ambulate she initially had to use a walker to ambulate.  She describes a burning sensation just below the medial joint line.  She has had radiographs of the left knee which I reviewed and shows significant patellofemoral arthritis and mild to moderate narrowing medial compartment.  No acute fractures in the knee is well located. Review of Systems See HPI otherwise negative  Objective: Vital Signs: Ht 5\' 6"  (1.676 m)   Wt 182 lb (82.6 kg)   BMI 29.38  kg/m   Physical Exam Constitutional:      Appearance: She is not ill-appearing or diaphoretic.  Pulmonary:     Effort: Pulmonary effort is normal.  Neurological:     Mental Status: She is alert and oriented to person, place, and time.  Psychiatric:        Mood and Affect: Mood normal.     Ortho Exam Bilateral knees no abnormal warmth erythema slight effusion left knee.  No instability valgus varus stressing of either knee McMurray's is negative bilaterally.  Nontender along medial lateral joint line of both knees.  Tenderness over the left pes anserinus slight tenderness over the right pes anserinus region. Specialty Comments:  No specialty comments available.  Imaging: No results found.   PMFS History: Patient Active Problem List   Diagnosis Date Noted  . Hypothyroidism 07/26/2019  . Primary osteoarthritis of right knee 02/03/2018  . Primary osteoarthritis of both hands 09/08/2017  . Sjogren's disease ([place]) 02/04/2016  . Dermatomyositis ([place]) 02/04/2016  . Former smoker 02/04/2016  . 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 Fatigue 02/15/2012   Past Medical History:  Diagnosis Date  . Allergy   . Anxiety   . Cancer ([place])    basal cell carcinoma - face  .  Dermatomyositis ([place]) 02/04/2016   Marisa Orozco 1 Positive  Skin Bx- Subtle interface Dermatitis   . Former smoker 02/04/2016  . GERD (gastroesophageal reflux disease)    otc med prn  . PONV (postoperative nausea and vomiting)   . Sjogren's disease ([place]) 02/04/2016   Positive ANA, Positive Ro, Positive CCP, Negative RF, Parotitis, Positive Sicca  . Sjogren's syndrome ([place])    from MD note    Family History  Problem Relation Age of Onset  . COPD Mother   . Heart disease Mother   . Diabetes Father   . Kidney disease Father   . Epilepsy Daughter   . Rectal cancer Maternal Uncle   . Esophageal cancer Neg Hx   . Stomach cancer Neg Hx   . Ulcerative colitis Neg Hx     Past Surgical History:  Procedure Laterality Date  . BREAST  SURGERY     augmentation  . BUNIONECTOMY     right foot  . COLONOSCOPY    . DIAGNOSTIC LAPAROSCOPY    . DILATION AND CURETTAGE OF UTERUS  04/15/2011   Procedure: DILATATION AND CURETTAGE;  Surgeon: [name];  Location: [facility];  Service: Gynecology;  Laterality: N/A;  . MUSCLE BIOPSY Left 01/02/2016   Procedure: MUSCLE BIOPSY LEFT DELTOID;  Surgeon: [name], MD;  Location: [facility];  Service: General;  Laterality: Left;  left deltoid muscle bx  . NASAL SEPTUM SURGERY    . SVD     x 2  . TONSILLECTOMY    . WISDOM TOOTH EXTRACTION     Social History   Occupational History  . Not on file  Tobacco Use  . Smoking status: Former Smoker    Packs/day: 1.00    Years: 25.00    Pack years: 25.00    Types: Cigarettes    Quit date: 08/05/2003    Years since quitting: 16.3  . Smokeless tobacco: Never Used  Vaping Use  . Vaping Use: Never used  Substance and Sexual Activity  . Alcohol use: Yes    Comment: socially - wine  . Drug use: No  . Sexual activity: Yes    Birth control/protection: None

## 2019-12-13 NOTE — Progress Notes (Signed)
Office Visit Note  Patient: Marisa Orozco             Date of Birth: Sep 06, 1950           MRN: [id number]             PCP: Caryl Bis, MD Referring: Caryl Bis, MD Visit Date: 12/26/2019 Occupation: @GUAROCC @  Subjective:  No chief complaint on file.   History of Present Illness: Marisa Orozco is a 69 y.o. female with history of dermatomyositis, osteoarthritis.  She states about about 6 weeks ago after walking on the treadmill she started having discomfort in her left knee joint which got worse over the next few days.  She was seen here in the office by Lovena Le and had cortisone injection.  She states she had minimal response to the cortisone injection.  She continues to have pain and discomfort in her left knee.  She was seen by [facility] who recommended another cortisone injection and MRI but patient declined.  She states she continues to have discomfort in her left knee she was using a walker and a cane before but now she has not been using it for the last 3 days.  She feels that she has some instability in her left knee joint and has giveaway feeling.  She has been using a brace.  Also doing some online exercises.  She has not had any flare of dermatomyositis.  She denies any rash.  Activities of Daily Living:  Patient reports morning stiffness for  0 minutes.   Patient Denies nocturnal pain.  Difficulty dressing/grooming: Denies Difficulty climbing stairs: Reports Difficulty getting out of chair: Denies Difficulty using hands for taps, buttons, cutlery, and/or writing: Denies  Review of Systems  Constitutional: Negative for fatigue.  HENT: Positive for mouth dryness and nose dryness. Negative for mouth sores.   Eyes: Negative for pain, itching and dryness.  Respiratory: Negative for shortness of breath and difficulty breathing.   Cardiovascular: Negative for chest pain and palpitations.  Gastrointestinal: Negative for blood in stool, constipation and  diarrhea.  Endocrine: Negative for increased urination.  Genitourinary: Negative for difficulty urinating.  Musculoskeletal: Positive for arthralgias, joint pain and muscle tenderness. Negative for joint swelling, myalgias, morning stiffness and myalgias.  Skin: Negative for color change, rash and redness.  Allergic/Immunologic: Negative for susceptible to infections.  Neurological: Negative for dizziness, numbness, headaches, memory loss and weakness.  Hematological: Positive for bruising/bleeding tendency.  Psychiatric/Behavioral: Positive for sleep disturbance. Negative for confusion.    PMFS History:  Patient Active Problem List   Diagnosis Date Noted  . Hypothyroidism 07/26/2019  . Primary osteoarthritis of right knee 02/03/2018  . Primary osteoarthritis of both hands 09/08/2017  . Sjogren's disease ([place]) 02/04/2016  . Dermatomyositis ([place]) 02/04/2016  . Former smoker 02/04/2016  . 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 Fatigue 02/15/2012    Past Medical History:  Diagnosis Date  . Allergy   . Anxiety   . Cancer ([place])    basal cell carcinoma - face  . Dermatomyositis ([place]) 02/04/2016   Jo 1 Positive  Skin Bx- Subtle interface Dermatitis   . Former smoker 02/04/2016  . GERD (gastroesophageal reflux disease)    otc med prn  . PONV (postoperative nausea and vomiting)   . Sjogren's disease ([place]) 02/04/2016   Positive ANA, Positive Ro, Positive CCP, Negative RF, Parotitis, Positive Sicca  . Sjogren's syndrome ([place])    from MD note    Family History  Problem Relation Age of Onset  . COPD Mother   . Heart disease Mother   . Diabetes Father   . Kidney disease Father   . Epilepsy Daughter   . Rectal cancer Maternal Uncle   . Esophageal cancer Neg Hx   . Stomach cancer Neg Hx   . Ulcerative colitis Neg Hx    Past Surgical History:  Procedure Laterality Date  . BREAST SURGERY     augmentation  . BUNIONECTOMY     right foot  . COLONOSCOPY    . DIAGNOSTIC LAPAROSCOPY    . DILATION  AND CURETTAGE OF UTERUS  04/15/2011   Procedure: DILATATION AND CURETTAGE;  Surgeon: [name];  Location: [facility];  Service: Gynecology;  Laterality: N/A;  . MUSCLE BIOPSY Left 01/02/2016   Procedure: MUSCLE BIOPSY LEFT DELTOID;  Surgeon: [name], MD;  Location: [facility];  Service: General;  Laterality: Left;  left deltoid muscle bx  . NASAL SEPTUM SURGERY    . SVD     x 2  . TONSILLECTOMY    . WISDOM TOOTH EXTRACTION     Social History   Social History Narrative  . Not on file   Immunization History  Administered Date(s) Administered  . Influenza, High Dose Seasonal PF 01/07/2018  . Influenza-Unspecified 01/04/2014  . PFIZER SARS-COV-2 Vaccination 04/26/2019, 05/17/2019, 12/12/2019  . Pneumococcal Conjugate-13 01/05/2016  . Zoster Recombinat (Shingrix) 08/11/2017, 12/17/2017     Objective: Vital Signs: BP 134/79 (BP Location: Left Arm, Patient Position: Sitting, Cuff Size: Small)   Pulse 80   Resp 14   Ht 5\' 6"  (1.676 m)   Wt 184 lb 3.2 oz (83.6 kg)   BMI 29.73 kg/m    Physical Exam Vitals and nursing note reviewed.  Constitutional:      Appearance: She is well-developed.  HENT:     Head: Normocephalic and atraumatic.  Eyes:     Conjunctiva/sclera: Conjunctivae normal.  Cardiovascular:     Rate and Rhythm: Normal rate and regular rhythm.     Heart sounds: Normal heart sounds.  Pulmonary:     Effort: Pulmonary effort is normal.     Breath sounds: Normal breath sounds.  Abdominal:     General: Bowel sounds are normal.     Palpations: Abdomen is soft.  Musculoskeletal:     Cervical back: Normal range of motion.  Lymphadenopathy:     Cervical: No cervical adenopathy.  Skin:    General: Skin is warm and dry.     Capillary Refill: Capillary refill takes less than 2 seconds.  Neurological:     Mental Status: She is alert and oriented to person, place, and time.  Psychiatric:        Behavior: Behavior normal.      Musculoskeletal  Exam: C-spine was in good range of motion.  Shoulder joints, elbow joints, wrist joints with good range of motion.  She has mild DIP and PIP thickening.  Hip joints and knee joints with good range of motion.  She discomfort range of motion of her left knee joint.  Some warmth was noted and small effusion was noted.  Ankle joints and MTPs had no tenderness.  She had no muscular weakness.  CDAI Exam: CDAI Score: -- Patient Global: --; Provider Global: -- Swollen: --; Tender: -- Joint Exam 12/26/2019   No joint exam has been documented for this visit   There is currently no information documented on the homunculus. Go to the Rheumatology activity and  complete the homunculus joint exam.  Investigation: No additional findings.  Imaging: XR KNEE 3 VIEW LEFT  Result Date: 11/27/2019 Moderate medial compartment narrowing was noted.  Moderate patellofemoral narrowing was noted.  No chondrocalcinosis was noted. Impression: These findings are consistent moderate osteoarthritis and moderate chondromalacia patella.   Recent Labs: Lab Results  Component Value Date   WBC 7.8 12/20/2019   HGB 14.1 12/20/2019   PLT 192 12/20/2019   NA 144 12/20/2019   K 4.3 12/20/2019   CL 106 12/20/2019   CO2 32 12/20/2019   GLUCOSE 83 12/20/2019   BUN 13 12/20/2019   CREATININE 0.77 12/20/2019   BILITOT 0.6 12/20/2019   ALKPHOS 44 10/12/2016   AST 20 12/20/2019   ALT 18 12/20/2019   PROT 6.8 12/20/2019   ALBUMIN 4.2 10/12/2016   CALCIUM 9.5 12/20/2019   GFRAA 91 12/20/2019   QFTBGOLDPLUS NEGATIVE 07/07/2017    Speciality Comments: PLQ Eye Exam: 10/10/2019 WNL @ [facility]. Follow up 1 year.  Procedures:  No procedures performed Allergies: Codeine, Flagyl [metronidazole hcl], Nexium [esomeprazole magnesium], Imuran [azathioprine], Sulfa antibiotics, and Sulfa drugs cross reactors   Assessment / Plan:     Visit Diagnoses: Dermatomyositis ([place]) - Jo 1+, Positive muscle biopsy for  dermatomyositis: She has no muscular weakness or tenderness.  She has been on methotrexate.  She is also taking prednisone 4.5 mg alternating with 4 mg every other day.  There is no history of recent rash.  Sjogren's syndrome with keratoconjunctivitis sicca (H[facility])-she continues to have sicca symptoms.  High risk medication use - Methotrexate 0.8 ml every 7 days, folic acid 1 mg 3 tablets daily, Plaquenil 200 mg 1 tablet twice daily, and prednisone 4.5 mg alternating with 4 mg every other day.  Her labs are stable.  CK is in desirable range.  We will continue to monitor labs every 3 months.  Chronic pain of left knee -she started having pain in her left knee about a month ago after walking on the treadmill.  The pain has been progressively getting worse.  The symptoms have improved in the last 3 days but not much.  She has moderate effusion in the left knee joint.  She states she has giveaway feeling in her knee joint and feels instability.  Please schedule MRI to evaluate this further.  Plan: MR KNEE LEFT WO CONTRAST  Primary osteoarthritis of both knees -she has known osteoarthritis in her knee joints.  Primary osteoarthritis of both hands-joint protection was discussed.  Age-related osteoporosis without current pathological fracture - DEXA on 06/02/2018: AP spine T score -2.7 with BMD 0.95.  -15.2% change from previous DEXA.  I again emphasized the need for treatment for osteoporosis.  She wants to hold off treatment.  She is trying exercise calcium and vitamin D.  Thymoma  Hair loss  History of vitamin D deficiency  Former smoker  Educated about COVID-19 virus infection-she is fully vaccinated against COVID-19.  She also received her booster.  I have advised her to continue to use mask, social distancing and hand hygiene.  Role of monoclonal antibodies in case she gets COVID-19 infection was also discussed.  Instructions were placed in the AVS.  Orders: Orders Placed This Encounter    Procedures  . MR KNEE LEFT WO CONTRAST   No orders of the defined types were placed in this encounter.     Follow-Up Instructions: Return in about 3 months (around 03/26/2020) for DM, Osteoarthritis, Osteoporosis.   Bo Merino, MD  Note - This record has been created using [employer].  Chart creation errors have been sought, but may not always  have been located. Such creation errors do not reflect on  the standard of medical care.

## 2019-12-14 ENCOUNTER — Other Ambulatory Visit: Payer: Self-pay | Admitting: Radiology

## 2019-12-14 DIAGNOSIS — M25562 Pain in left knee: Secondary | ICD-10-CM

## 2019-12-18 ENCOUNTER — Other Ambulatory Visit: Payer: Self-pay | Admitting: *Deleted

## 2019-12-18 ENCOUNTER — Encounter: Payer: Self-pay | Admitting: Rheumatology

## 2019-12-18 DIAGNOSIS — Z79899 Other long term (current) drug therapy: Secondary | ICD-10-CM

## 2019-12-20 DIAGNOSIS — Z79899 Other long term (current) drug therapy: Secondary | ICD-10-CM | POA: Diagnosis not present

## 2019-12-20 DIAGNOSIS — E039 Hypothyroidism, unspecified: Secondary | ICD-10-CM | POA: Diagnosis not present

## 2019-12-20 LAB — COMPLETE METABOLIC PANEL WITH GFR
AG Ratio: 1.8 (calc) (ref 1.0–2.5)
ALT: 18 U/L (ref 6–29)
AST: 20 U/L (ref 10–35)
Albumin: 4.4 g/dL (ref 3.6–5.1)
Alkaline phosphatase (APISO): 74 U/L (ref 37–153)
BUN: 13 mg/dL (ref 7–25)
CO2: 32 mmol/L (ref 20–32)
Calcium: 9.5 mg/dL (ref 8.6–10.4)
Chloride: 106 mmol/L (ref 98–110)
Creat: 0.77 mg/dL (ref 0.50–0.99)
GFR, Est African American: 91 mL/min/{1.73_m2} (ref >=60)
GFR, Est Non African American: 79 mL/min/{1.73_m2} (ref >=60)
Globulin: 2.4 g/dL (calc) (ref 1.9–3.7)
Glucose, Bld: 83 mg/dL (ref 65–139)
Potassium: 4.3 mmol/L (ref 3.5–5.3)
Sodium: 144 mmol/L (ref 135–146)
Total Bilirubin: 0.6 mg/dL (ref 0.2–1.2)
Total Protein: 6.8 g/dL (ref 6.1–8.1)

## 2019-12-20 LAB — CBC WITH DIFFERENTIAL/PLATELET
Absolute Monocytes: 577 cells/uL (ref 200–950)
Basophils Absolute: 47 cells/uL (ref 0–200)
Basophils Relative: 0.6 %
Eosinophils Absolute: 328 cells/uL (ref 15–500)
Eosinophils Relative: 4.2 %
HCT: 42.9 % (ref 35.0–45.0)
Hemoglobin: 14.1 g/dL (ref 11.7–15.5)
Lymphs Abs: 1950 cells/uL (ref 850–3900)
MCH: 31.1 pg (ref 27.0–33.0)
MCHC: 32.9 g/dL (ref 32.0–36.0)
MCV: 94.7 fL (ref 80.0–100.0)
MPV: 11.2 fL (ref 7.5–12.5)
Monocytes Relative: 7.4 %
Neutro Abs: 4898 cells/uL (ref 1500–7800)
Neutrophils Relative %: 62.8 %
Platelets: 192 10*3/uL (ref 140–400)
RBC: 4.53 10*6/uL (ref 3.80–5.10)
RDW: 12.7 % (ref 11.0–15.0)
Total Lymphocyte: 25 %
WBC: 7.8 10*3/uL (ref 3.8–10.8)

## 2019-12-20 LAB — CK: Total CK: 56 U/L (ref 29–143)

## 2019-12-20 LAB — TSH: TSH: 2.63 mIU/L (ref 0.40–4.50)

## 2019-12-20 LAB — T4, FREE: Free T4: 1.1 ng/dL (ref 0.8–1.8)

## 2019-12-20 NOTE — Progress Notes (Signed)
CBC, CMP and CK are normal.

## 2019-12-26 ENCOUNTER — Encounter: Payer: Self-pay | Admitting: "Endocrinology

## 2019-12-26 ENCOUNTER — Encounter: Payer: Self-pay | Admitting: Rheumatology

## 2019-12-26 ENCOUNTER — Other Ambulatory Visit: Payer: Self-pay

## 2019-12-26 ENCOUNTER — Telehealth (INDEPENDENT_AMBULATORY_CARE_PROVIDER_SITE_OTHER): Payer: Medicare Other | Admitting: "Endocrinology

## 2019-12-26 ENCOUNTER — Other Ambulatory Visit: Payer: Self-pay | Admitting: Rheumatology

## 2019-12-26 ENCOUNTER — Ambulatory Visit (INDEPENDENT_AMBULATORY_CARE_PROVIDER_SITE_OTHER): Payer: Medicare Other | Admitting: Rheumatology

## 2019-12-26 VITALS — Ht 66.0 in

## 2019-12-26 VITALS — BP 134/79 | HR 80 | Resp 14 | Ht 66.0 in | Wt 184.2 lb

## 2019-12-26 DIAGNOSIS — Z8639 Personal history of other endocrine, nutritional and metabolic disease: Secondary | ICD-10-CM

## 2019-12-26 DIAGNOSIS — G8929 Other chronic pain: Secondary | ICD-10-CM

## 2019-12-26 DIAGNOSIS — M3390 Dermatopolymyositis, unspecified, organ involvement unspecified: Secondary | ICD-10-CM | POA: Diagnosis not present

## 2019-12-26 DIAGNOSIS — M25562 Pain in left knee: Secondary | ICD-10-CM | POA: Diagnosis not present

## 2019-12-26 DIAGNOSIS — Z7189 Other specified counseling: Secondary | ICD-10-CM

## 2019-12-26 DIAGNOSIS — Z79899 Other long term (current) drug therapy: Secondary | ICD-10-CM | POA: Diagnosis not present

## 2019-12-26 DIAGNOSIS — D4989 Neoplasm of unspecified behavior of other specified sites: Secondary | ICD-10-CM

## 2019-12-26 DIAGNOSIS — M3501 Sicca syndrome with keratoconjunctivitis: Secondary | ICD-10-CM

## 2019-12-26 DIAGNOSIS — M19041 Primary osteoarthritis, right hand: Secondary | ICD-10-CM | POA: Diagnosis not present

## 2019-12-26 DIAGNOSIS — L659 Nonscarring hair loss, unspecified: Secondary | ICD-10-CM

## 2019-12-26 DIAGNOSIS — M170 Bilateral primary osteoarthritis of knee: Secondary | ICD-10-CM

## 2019-12-26 DIAGNOSIS — Z87891 Personal history of nicotine dependence: Secondary | ICD-10-CM

## 2019-12-26 DIAGNOSIS — M810 Age-related osteoporosis without current pathological fracture: Secondary | ICD-10-CM | POA: Diagnosis not present

## 2019-12-26 DIAGNOSIS — E039 Hypothyroidism, unspecified: Secondary | ICD-10-CM

## 2019-12-26 DIAGNOSIS — M19042 Primary osteoarthritis, left hand: Secondary | ICD-10-CM

## 2019-12-26 DIAGNOSIS — M25362 Other instability, left knee: Secondary | ICD-10-CM

## 2019-12-26 MED ORDER — LEVOTHYROXINE SODIUM 50 MCG PO TABS
ORAL_TABLET | ORAL | 1 refills | Status: DC
Start: 2019-12-26 — End: 2020-06-25

## 2019-12-26 NOTE — Patient Instructions (Signed)
Standing Labs We placed an order today for your standing lab work.   Please have your standing labs drawn in December and every 3 months   If possible, please have your labs drawn 2 weeks prior to your appointment so that the provider can discuss your results at your appointment.  We have open lab daily Monday through Thursday from 8:30-12:30 PM and 1:30-4:30 PM and Friday from 8:30-12:30 PM and 1:30-4:00 PM at the office of Dr. Donyel Nester, [facility].   Please be advised, patients with office appointments requiring lab work will take precedents over walk-in lab work.  If possible, please come for your lab work on Monday and Friday afternoons, as you may experience shorter wait times. The office is located at [address], [address], [place], [place] [address] No appointment is necessary.   Labs are drawn by [facility]. Please bring your co-pay at the time of your lab draw.  You may receive a bill from [facility] for your lab work.  If you wish to have your labs drawn at another location, please call the office 24 hours in advance to send orders.  If you have any questions regarding directions or hours of operation,  please call [phone].   As a reminder, please drink plenty of water prior to coming for your lab work. Thanks!   COVID-19 vaccine recommendations:   COVID-19 vaccine is recommended for everyone (unless you are allergic to a vaccine component), even if you are on a medication that suppresses your immune system.   If you are on Methotrexate, Cellcept (mycophenolate), Rinvoq, Xeljanz, and Olumiant- hold the medication for 1 week after each vaccine. Hold Methotrexate for 2 weeks after the single dose COVID-19 vaccine.   If you are on Orencia subcutaneous injection - hold medication one week prior to and one week after the first COVID-19 vaccine dose (only).   If you are on Orencia IV infusions- time vaccination administration so that the first COVID-19  vaccination will occur four weeks after the infusion and postpone the subsequent infusion by one week.   If you are on Cyclophosphamide or Rituxan infusions please contact your doctor prior to receiving the COVID-19 vaccine.   Do not take Tylenol or any anti-inflammatory medications (NSAIDs) 24 hours prior to the COVID-19 vaccination.   There is no direct evidence about the efficacy of the COVID-19 vaccine in individuals who are on medications that suppress the immune system.   Even if you are fully vaccinated, and you are on any medications that suppress your immune system, please continue to wear a mask, maintain at least six feet social distance and practice hand hygiene.   If you develop a COVID-19 infection, please contact your PCP or our office to determine if you need antibody infusion.  The booster vaccine is now available for immunocompromised patients. It is advised that if you had Pfizer vaccine you should get Pfizer booster.  If you had a Moderna vaccine then you should get a Moderna booster. Johnson and Johnson does not have a booster vaccine at this time.  Please see the following web sites for updated information.   [email or web address]://[email or web address]/[email or web address]/[email or web address]-19-Vaccination-Patient-Resources.[email or web address]  https://[email or web address]/About-Us/Newsroom/Press-Releases/ID/1159    

## 2019-12-26 NOTE — Progress Notes (Signed)
12/26/2019, 4:52 PM                                 Endocrinology Telehealth Visit Follow up Note -During COVID -19 Pandemic  This visit type was conducted  via telephone due to national recommendations for restrictions regarding the COVID-19 Pandemic  in an effort to limit this patient's exposure and mitigate transmission of the corona virus.   I connected with Marisa Orozco on 12/26/2019   by telephone and verified that I am speaking with the correct person using two identifiers. Marisa Orozco, [birth year]. she has verbally consented to this visit.  I was in my office and patient was in her residence. No other persons were with me during the encounter. All issues noted in this document were discussed and addressed. The format was not optimal for physical exam.    Marisa Orozco is a 69 y.o.-year-old female patient being seen in follow-up for hypothyroidism. PMD:  Caryl Bis, MD.   Past Medical History:  Diagnosis Date  . Allergy   . Anxiety   . Cancer ([place])    basal cell carcinoma - face  . Dermatomyositis ([place]) 02/04/2016   Jo 1 Positive  Skin Bx- Subtle interface Dermatitis   . Former smoker 02/04/2016  . GERD (gastroesophageal reflux disease)    otc med prn  . PONV (postoperative nausea and vomiting)   . Sjogren's disease ([place]) 02/04/2016   Positive ANA, Positive Ro, Positive CCP, Negative RF, Parotitis, Positive Sicca  . Sjogren's syndrome ([place])    from MD note    Past Surgical History:  Procedure Laterality Date  . BREAST SURGERY     augmentation  . BUNIONECTOMY     right foot  . COLONOSCOPY    . DIAGNOSTIC LAPAROSCOPY    . DILATION AND CURETTAGE OF UTERUS  04/15/2011   Procedure: DILATATION AND CURETTAGE;  Surgeon: [name];  Location: [facility];  Service: Gynecology;  Laterality: N/A;  . MUSCLE BIOPSY Left 01/02/2016   Procedure: MUSCLE BIOPSY  LEFT DELTOID;  Surgeon: [name], MD;  Location: [facility];  Service: General;  Laterality: Left;  left deltoid muscle bx  . NASAL SEPTUM SURGERY    . SVD     x 2  . TONSILLECTOMY    . WISDOM TOOTH EXTRACTION      Social History   Socioeconomic History  . Marital status: Married    Spouse name: Not on file  . Number of children: Not on file  . Years of education: Not on file  . Highest education level: Not on file  Occupational History  . Not on file  Tobacco Use  . Smoking status: Former Smoker    Packs/day: 1.00    Years: 25.00  Pack years: 25.00    Types: Cigarettes    Quit date: 08/05/2003    Years since quitting: 16.4  . Smokeless tobacco: Never Used  Vaping Use  . Vaping Use: Never used  Substance and Sexual Activity  . Alcohol use: Yes    Comment: socially - wine  . Drug use: No  . Sexual activity: Yes    Birth control/protection: None  Other Topics Concern  . Not on file  Social History Narrative  . Not on file   Social Determinants of Health   Financial Resource Strain:   . Difficulty of Paying Living Expenses: Not on file  Food Insecurity:   . Worried About Charity fundraiser in the Last Year: Not on file  . Ran Out of Food in the Last Year: Not on file  Transportation Needs:   . Lack of Transportation (Medical): Not on file  . Lack of Transportation (Non-Medical): Not on file  Physical Activity:   . Days of Exercise per Week: Not on file  . Minutes of Exercise per Session: Not on file  Stress:   . Feeling of Stress : Not on file  Social Connections:   . Frequency of Communication with Friends and Family: Not on file  . Frequency of Social Gatherings with Friends and Family: Not on file  . Attends Religious Services: Not on file  . Active Member of Clubs or Organizations: Not on file  . Attends Archivist Meetings: Not on file  . Marital Status: Not on file    Family History  Problem Relation Age of Onset  .  COPD Mother   . Heart disease Mother   . Diabetes Father   . Kidney disease Father   . Epilepsy Daughter   . Rectal cancer Maternal Uncle   . Esophageal cancer Neg Hx   . Stomach cancer Neg Hx   . Ulcerative colitis Neg Hx     Outpatient Encounter Medications as of 12/26/2019  Medication Sig  . Ascorbic Acid (VITAMIN C PO) Take by mouth daily. (Patient not taking: Reported on 12/26/2019)  . Calcium Citrate-Vitamin D (CALCIUM CITRATE+D3 PO) Take by mouth daily.  . Cholecalciferol (VITAMIN D3) 1000 UNIT/SPRAY LIQD Take 5 sprays by mouth daily.   . Coenzyme Q10 (COQ10) 100 MG CAPS Take by mouth every morning.  . folic acid (FOLVITE) 1 MG tablet Take 3 tablets (3 mg total) by mouth daily. (Patient taking differently: Take 2 mg by mouth daily. )  . hydroxychloroquine (PLAQUENIL) 200 MG tablet Take 1 tablet (200 mg total) by mouth 2 (two) times daily.  [place] Kitchen levothyroxine (SYNTHROID) 50 MCG tablet TAKE 1 TABLET BY MOUTH DAILY BEFORE BREAKFAST.  . methotrexate 50 MG/2ML injection INJECT 0.8ML INTO THE SKIN ONCE A WEEK  . Multiple Vitamins-Minerals (MULTIVITAMIN ADULT PO) Take 1 tablet by mouth daily.   [place] Kitchen OVER THE COUNTER MEDICATION Burberine, one capsule daily.  [place] Kitchen OVER THE COUNTER MEDICATION Osteo Sheath   . predniSONE (DELTASONE) 1 MG tablet Take 4.5 tablets (4.5 mg total) by mouth daily with breakfast. (Patient taking differently: Take 4.5 mg by mouth daily with breakfast. Alternating with 5 mg every other day.)  . TUBERCULIN SYR 1CC/27GX1/2" 27G X 1/2" 1 ML MISC Patient to use weekly to inject Methotrexate  . TURMERIC PO Take 1 capsule by mouth daily.   [place] Kitchen VITAMIN K PO Take 1 tablet by mouth daily.   . [DISCONTINUED] levothyroxine (SYNTHROID) 25 MCG tablet TAKE 1 TABLET BY MOUTH DAILY BEFORE  BREAKFAST.   No facility-administered encounter medications on file as of 12/26/2019.    ALLERGIES: Allergies  Allergen Reactions  . Codeine Nausea And Vomiting  . Flagyl [Metronidazole Hcl] Other (See  Comments)    Reaction muscle weakness  . Nexium [Esomeprazole Magnesium] Other (See Comments)    Reaction Muscle weakness  . Imuran [Azathioprine] Other (See Comments)    Joint Pain  . Sulfa Antibiotics Rash  . Sulfa Drugs Cross Reactors Rash   VACCINATION STATUS: Immunization History  Administered Date(s) Administered  . Influenza, High Dose Seasonal PF 01/07/2018  . Influenza-Unspecified 01/04/2014  . PFIZER SARS-COV-2 Vaccination 04/26/2019, 05/17/2019, 12/12/2019  . Pneumococcal Conjugate-13 01/05/2016  . Zoster Recombinat (Shingrix) 08/11/2017, 12/17/2017     HPI    Marisa Orozco  is a patient with the above medical history.  She is being engaged for follow-up of hypothyroidism.  She has no new complaints today, except for mild distention to her dorsal neck likely related to her chronic exposure to steroids.  She was initiated on low-dose levothyroxine during her last visit, levothyroxine 25 mcg p.o. daily before breakfast.  Her previsit thyroid function tests are unchanged from her last time, showing slight improvement from the original studies.   -She has steady weight after she lost 9 pounds of weight.  She remains on a tapering dose of prednisone given for either rheumatologic conditions including Sjogren's syndrome.     She denies heat/cold intolerance.  She denies dysphagia, shortness of breath, no voice change.  She has family history of thyroid dysfunction of what appears to be hypothyroidism in her mother.  -She is also on methotrexate/Plaquenil in addition to on and off prednisone.   Pt denies feeling nodules in neck, No family history of thyroid cancer.  No history of  radiation therapy to head or neck. No recent use of iodine supplements.   ROS:  Limited as above.   Physical Exam: Ht 5\' 6"  (1.676 m)   BMI 29.38 kg/m  Wt Readings from Last 3 Encounters:  12/26/19 184 lb 3.2 oz (83.6 kg)  12/13/19 182 lb (82.6 kg)  11/27/19 182 lb 3.2 oz (82.6 kg)     CMP ( most recent) CMP     Component Value Date/Time   NA 144 12/20/2019 0752   NA 137 01/16/2016 0000   K 4.3 12/20/2019 0752   CL 106 12/20/2019 0752   CO2 32 12/20/2019 0752   GLUCOSE 83 12/20/2019 0752   BUN 13 12/20/2019 0752   BUN 11 01/16/2016 0000   CREATININE 0.77 12/20/2019 0752   CALCIUM 9.5 12/20/2019 0752   PROT 6.8 12/20/2019 0752   ALBUMIN 4.2 10/12/2016 1202   AST 20 12/20/2019 0752   ALT 18 12/20/2019 0752   ALKPHOS 44 10/12/2016 1202   BILITOT 0.6 12/20/2019 0752   GFRNONAA 79 12/20/2019 0752   GFRAA 91 12/20/2019 0752    Lab Results  Component Value Date   TSH 2.63 12/20/2019   TSH 2.43 07/25/2019   TSH 4.89 03/25/2018   TSH 4.18 03/25/2018   TSH 1.16 09/01/2016   TSH 0.87 02/18/2016   FREET4 1.1 12/20/2019   FREET4 1.1 07/25/2019       ASSESSMENT: 1. Hypothyroidism  PLAN:   -Her previsit thyroid function tests are such that she would benefit from slight increase in her levothyroxine.  I discussed and increase her levothyroxine to 50 mcg p.o. daily before breakfast.    - We discussed about the correct intake of her thyroid hormone,  on empty stomach at fasting, with water, separated by at least 30 minutes from breakfast and other medications,  and separated by more than 4 hours from calcium, iron, multivitamins, acid reflux medications (PPIs). -Patient is made aware of the fact that thyroid hormone replacement is needed for life, dose to be adjusted by periodic monitoring of thyroid function tests.  -Due to absence of clinical goiter, no need for thyroid ultrasound. -Her ongoing exposure to steroids related to her other rheumatological conditions is interfering with her ability to lose weight.  More importantly, she is at risk of adrenal insufficiency if steroids are withdrawn suddenly.  Tapering off if possible is always advisable  She is advised to maintain close follow-up with her PMD, and her rheumatologist.    - Time spent on this  patient care encounter:  20 minutes of which 50% was spent in  counseling and the rest reviewing  her current and  previous labs / studies and medications  doses and developing a plan for long term care. Marisa Orozco  participated in the discussions, expressed understanding, and voiced agreement with the above plans.  All questions were answered to her satisfaction. she is encouraged to contact clinic should she have any questions or concerns prior to her return visit.    Return in about 6 months (around 06/24/2020) for F/U with Pre-visit Labs.  Glade Lloyd, MD [facility] [address] [place], [place] [address] Phone: [phone]  Fax: [phone]   12/26/2019, 4:52 PM  This note was partially dictated with voice recognition software. Similar sounding words can be transcribed inadequately or may not  be corrected upon review.

## 2020-01-03 DIAGNOSIS — Z23 Encounter for immunization: Secondary | ICD-10-CM | POA: Diagnosis not present

## 2020-01-05 ENCOUNTER — Telehealth: Payer: Self-pay | Admitting: Orthopaedic Surgery

## 2020-01-05 NOTE — Telephone Encounter (Signed)
Called patient left message to return call to schedule an appointment for MRI review with Dr Ninfa Linden

## 2020-01-09 ENCOUNTER — Ambulatory Visit (HOSPITAL_COMMUNITY)
Admission: RE | Admit: 2020-01-09 | Discharge: 2020-01-09 | Disposition: A | Discharge status: Home / Self Care | Payer: Medicare Other | Source: Ambulatory Visit | Attending: Rheumatology | Admitting: Rheumatology

## 2020-01-09 DIAGNOSIS — M25362 Other instability, left knee: Secondary | ICD-10-CM | POA: Diagnosis not present

## 2020-01-09 DIAGNOSIS — G8929 Other chronic pain: Secondary | ICD-10-CM | POA: Diagnosis not present

## 2020-01-09 DIAGNOSIS — M170 Bilateral primary osteoarthritis of knee: Secondary | ICD-10-CM

## 2020-01-09 DIAGNOSIS — M25462 Effusion, left knee: Secondary | ICD-10-CM | POA: Diagnosis not present

## 2020-01-09 DIAGNOSIS — M1712 Unilateral primary osteoarthritis, left knee: Secondary | ICD-10-CM | POA: Diagnosis not present

## 2020-01-09 DIAGNOSIS — S83242A Other tear of medial meniscus, current injury, left knee, initial encounter: Secondary | ICD-10-CM | POA: Diagnosis not present

## 2020-01-09 DIAGNOSIS — M25562 Pain in left knee: Secondary | ICD-10-CM | POA: Insufficient documentation

## 2020-01-10 NOTE — Progress Notes (Signed)
Patient was seen by Artis Delay recently.  She was having persistent pain and swelling in her knee.  I am forwarding you the MRI results.  I have advised her to schedule an appointment with you. Thank you, Abel Presto

## 2020-01-11 ENCOUNTER — Encounter: Payer: Self-pay | Admitting: Orthopaedic Surgery

## 2020-01-11 ENCOUNTER — Ambulatory Visit (INDEPENDENT_AMBULATORY_CARE_PROVIDER_SITE_OTHER): Payer: Medicare Other | Admitting: Orthopaedic Surgery

## 2020-01-11 DIAGNOSIS — M25562 Pain in left knee: Secondary | ICD-10-CM | POA: Diagnosis not present

## 2020-01-11 NOTE — Progress Notes (Signed)
Marisa Orozco comes in today to go over a MRI of her left knee.  She had an acute event to that knee back in August when she was using a treadmill for exercising.  She woke up with significant left knee pain after that.  She tried conservative treatment with rest and activity modification.  She is even had a steroid injection in the knee.  She continues to have a recurrent effusion.  We sent her for an MRI of that left knee given the fact that her plain film still showed good joint space in the medial lateral compartments.  MRI is reviewed with her and it does show significant edema in the medial tibial plateau area suggesting a subchondral stress fracture.  There is a tear of the meniscus at the meniscal root.  There is a moderate effusion as well.  She does have patellofemoral arthritic changes.  The major ligamentous structures of the knee are intact and the lateral compartment is intact.  There is no full-thickness cartilage defect on the medial side.  She has not had any locking catching.  She does report that some of her pain is subsiding somewhat.  She does offload that knee using a walking stick.  She is not interested in any type of surgical intervention.  I agree with [place] trying to just watch this with time.  The meniscal tear is not symptomatic and most of her symptoms come from the tibial stress fracture.  An operative intervention would involve an arthroscopy with a subchondroplasty.  I would like to have her continue to work on her quad strengthening exercise routine and continue to offload the knee.  She can put full weight on it but I did agree with her using a walking stick and avoiding any high impact aerobic activities.  We will see her back in 6 weeks to see how she is doing overall.  I would like a standing AP and lateral of her left knee at that visit.

## 2020-02-03 DIAGNOSIS — E119 Type 2 diabetes mellitus without complications: Secondary | ICD-10-CM | POA: Diagnosis not present

## 2020-02-03 DIAGNOSIS — M1712 Unilateral primary osteoarthritis, left knee: Secondary | ICD-10-CM | POA: Diagnosis not present

## 2020-02-03 DIAGNOSIS — E039 Hypothyroidism, unspecified: Secondary | ICD-10-CM | POA: Diagnosis not present

## 2020-02-03 DIAGNOSIS — E7849 Other hyperlipidemia: Secondary | ICD-10-CM | POA: Diagnosis not present

## 2020-02-22 ENCOUNTER — Ambulatory Visit (INDEPENDENT_AMBULATORY_CARE_PROVIDER_SITE_OTHER): Payer: Medicare Other

## 2020-02-22 ENCOUNTER — Encounter: Payer: Self-pay | Admitting: Orthopaedic Surgery

## 2020-02-22 ENCOUNTER — Ambulatory Visit (INDEPENDENT_AMBULATORY_CARE_PROVIDER_SITE_OTHER): Payer: Medicare Other | Admitting: Orthopaedic Surgery

## 2020-02-22 DIAGNOSIS — M25562 Pain in left knee: Secondary | ICD-10-CM | POA: Diagnosis not present

## 2020-02-22 NOTE — Progress Notes (Signed)
I wanted to see the patient in follow-up today to make sure her left knee was doing well.  She is an avid walker and walked almost daily for a year.  She had was developing medial joint line tenderness of her left knee and had failed conservative treatment.  We did obtain an MRI that still showed cartilage remaining in her knee but some thinning medially.  There is also subchondral edema in the bone and a small meniscal tear.  She still denies locking catching reports that she is doing much better overall.  On exam her left knee shows no effusion.  Her range of motion is full.  Her Lachman's and McMurray's exams are negative.  She only has some mild medial joint tenderness.  I did obtain standing x-rays of the left knee today and these showed no acute findings.  There is some arthritic changes in the medial compartment and slight varus malalignment.  At this point she should get back to her activities as she tolerates but should avoid hills on her walks and keep it on a flat surface.  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  All questions and concerns were answered and addressed.  Follow-up can be as needed at this standpoint

## 2020-02-25 DIAGNOSIS — Z20822 Contact with and (suspected) exposure to covid-19: Secondary | ICD-10-CM | POA: Diagnosis not present

## 2020-02-27 ENCOUNTER — Other Ambulatory Visit: Payer: Self-pay | Admitting: Rheumatology

## 2020-02-27 NOTE — Telephone Encounter (Signed)
Last Visit: 12/26/2019 Next Visit: 03/27/2020 Labs: 12/20/2019 CBC, CMP and CK are normal.  Current Dose per office note 12/26/2019: Methotrexate 0.8 ml every 7 days DX: Dermatomyositis  Okay to refill per Dr. Estanislado Pandy

## 2020-03-03 ENCOUNTER — Other Ambulatory Visit: Payer: Self-pay | Admitting: Rheumatology

## 2020-03-03 DIAGNOSIS — M3501 Sicca syndrome with keratoconjunctivitis: Secondary | ICD-10-CM

## 2020-03-04 MED ORDER — HYDROXYCHLOROQUINE SULFATE 200 MG PO TABS: 200.0000 mg | ORAL_TABLET | Freq: Two times a day (BID) | ORAL | 0 refills | Status: DC

## 2020-03-04 MED ORDER — PREDNISONE 1 MG PO TABS
4.5000 mg | ORAL_TABLET | Freq: Every day | ORAL | 0 refills | Status: DC
Start: 2020-03-04 — End: 2020-07-22

## 2020-03-04 NOTE — Telephone Encounter (Signed)
Last Visit: 12/26/2019 Next Visit: 03/27/2020 Labs: 12/20/2019 CBC, CMP and CK are normal. PLQ Eye Exam: 10/10/2019 WNL  Current Dose per office note 12/26/2019:prednisone 4.5 mg alternating with 4 mg every other day, Plaquenil 200 mg 1 tablet twice daily DX: Dermatomyositis   Okay to refill per Dr. Estanislado Pandy

## 2020-03-05 NOTE — Telephone Encounter (Signed)
Spoke with pharmacy and Ebony Hail states they received the prescription. Ebony Hail states it was set up to be auto-filled later this week but she will go ahead and get the prescription ready now. Patient advised.

## 2020-03-13 NOTE — Progress Notes (Signed)
Office Visit Note  Patient: Marisa Orozco             Date of Birth: 1950-05-08           MRN: [id number]             PCP: Caryl Bis, MD Referring: Caryl Bis, MD Visit Date: 03/27/2020 Occupation: @GUAROCC @  Subjective:  Medication monitoring.   History of Present Illness: Marisa Orozco is a 69 y.o. female with history of dermatomyositis.  She states she has been doing well on current combination of medications.  She is tapering prednisone and has reduce prednisone to 3 mg p.o. daily.  She denies any increased muscle weakness or tenderness.  She denies any rash.  Her left knee joint is better.  She states she saw Dr. Ninfa Linden for a while and did not require surgery.  Activities of Daily Living:  Patient reports morning stiffness for 1 minute.   Patient Denies nocturnal pain.  Difficulty dressing/grooming: Denies Difficulty climbing stairs: Reports Difficulty getting out of chair: Denies Difficulty using hands for taps, buttons, cutlery, and/or writing: Denies  Review of Systems  Constitutional: Negative for fatigue.  HENT: Positive for mouth dryness and nose dryness. Negative for mouth sores.   Eyes: Negative for pain, itching, visual disturbance and dryness.  Respiratory: Negative for cough, hemoptysis, shortness of breath and difficulty breathing.   Cardiovascular: Negative for chest pain, palpitations and swelling in legs/feet.  Gastrointestinal: Negative for abdominal pain, blood in stool, constipation and diarrhea.  Endocrine: Negative for increased urination.  Genitourinary: Negative for painful urination.  Musculoskeletal: Positive for arthralgias, joint pain and morning stiffness. Negative for joint swelling, myalgias, muscle weakness, muscle tenderness and myalgias.  Skin: Negative for color change, rash and redness.  Allergic/Immunologic: Negative for susceptible to infections.  Neurological: Negative for dizziness, numbness, headaches, memory loss and  weakness.  Hematological: Negative for swollen glands.  Psychiatric/Behavioral: Positive for sleep disturbance. Negative for confusion.    PMFS History:  Patient Active Problem List   Diagnosis Date Noted  . Hypothyroidism 07/26/2019  . Primary osteoarthritis of right knee 02/03/2018  . Primary osteoarthritis of both hands 09/08/2017  . Sjogren's disease ([place]) 02/04/2016  . Dermatomyositis ([place]) 02/04/2016  . Former smoker 02/04/2016  . 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 Fatigue 02/15/2012    Past Medical History:  Diagnosis Date  . Allergy   . Anxiety   . Cancer ([place])    basal cell carcinoma - face  . Dermatomyositis ([place]) 02/04/2016   Jo 1 Positive  Skin Bx- Subtle interface Dermatitis   . Former smoker 02/04/2016  . GERD (gastroesophageal reflux disease)    otc med prn  . PONV (postoperative nausea and vomiting)   . Sjogren's disease ([place]) 02/04/2016   Positive ANA, Positive Ro, Positive CCP, Negative RF, Parotitis, Positive Sicca  . Sjogren's syndrome ([place])    from MD note    Family History  Problem Relation Age of Onset  . COPD Mother   . Heart disease Mother   . Diabetes Father   . Kidney disease Father   . Epilepsy Daughter   . Rectal cancer Maternal Uncle   . Esophageal cancer Neg Hx   . Stomach cancer Neg Hx   . Ulcerative colitis Neg Hx    Past Surgical History:  Procedure Laterality Date  . BREAST SURGERY     augmentation  . BUNIONECTOMY     right foot  . COLONOSCOPY    . DIAGNOSTIC LAPAROSCOPY    .  DILATION AND CURETTAGE OF UTERUS  04/15/2011   Procedure: DILATATION AND CURETTAGE;  Surgeon: [name];  Location: [facility];  Service: Gynecology;  Laterality: N/A;  . MUSCLE BIOPSY Left 01/02/2016   Procedure: MUSCLE BIOPSY LEFT DELTOID;  Surgeon: [name], MD;  Location: [facility];  Service: General;  Laterality: Left;  left deltoid muscle bx  . NASAL SEPTUM SURGERY    . SVD     x 2  . TONSILLECTOMY    . WISDOM TOOTH EXTRACTION      Social History   Social History Narrative  . Not on file   Immunization History  Administered Date(s) Administered  . Influenza, High Dose Seasonal PF 01/07/2018  . Influenza-Unspecified 01/04/2014  . PFIZER SARS-COV-2 Vaccination 04/26/2019, 05/17/2019, 12/12/2019  . Pneumococcal Conjugate-13 01/05/2016  . Zoster Recombinat (Shingrix) 08/11/2017, 12/17/2017     Objective: Vital Signs: BP 136/67 (BP Location: Left Arm, Patient Position: Sitting, Cuff Size: Small)   Pulse 76   Resp 12   Ht 5' 6.5" (1.689 m)   Wt 171 lb (77.6 kg)   BMI 27.19 kg/m    Physical Exam Vitals and nursing note reviewed.  Constitutional:      Appearance: She is well-developed and well-nourished.  HENT:     Head: Normocephalic and atraumatic.  Eyes:     Extraocular Movements: EOM normal.     Conjunctiva/sclera: Conjunctivae normal.  Cardiovascular:     Rate and Rhythm: Normal rate and regular rhythm.     Pulses: Intact distal pulses.     Heart sounds: Normal heart sounds.  Pulmonary:     Effort: Pulmonary effort is normal.     Breath sounds: Normal breath sounds.  Abdominal:     General: Bowel sounds are normal.     Palpations: Abdomen is soft.  Musculoskeletal:     Cervical back: Normal range of motion.  Lymphadenopathy:     Cervical: No cervical adenopathy.  Skin:    General: Skin is warm and dry.     Capillary Refill: Capillary refill takes less than 2 seconds.  Neurological:     Mental Status: She is alert and oriented to person, place, and time.  Psychiatric:        Mood and Affect: Mood and affect normal.        Behavior: Behavior normal.      Musculoskeletal Exam: C-spine, thoracic and lumbar spine with good range of motion.  Shoulder joints, elbow joints, wrist joints, MCPs PIPs and DIPs with good range of motion with no synovitis.  Hip joints, knee joints, ankles, MTPs and PIPs with good range of motion with no synovitis.  She has no muscular weakness or tenderness.  CDAI  Exam: CDAI Score: -- Patient Global: --; Provider Global: -- Swollen: --; Tender: -- Joint Exam 03/27/2020   No joint exam has been documented for this visit   There is currently no information documented on the homunculus. Go to the Rheumatology activity and complete the homunculus joint exam.  Investigation: No additional findings.  Imaging: No results found.  Recent Labs: Lab Results  Component Value Date   WBC 7.7 03/26/2020   HGB 14.7 03/26/2020   PLT 158 03/26/2020   NA 143 03/26/2020   K 4.8 03/26/2020   CL 106 03/26/2020   CO2 29 03/26/2020   GLUCOSE 75 03/26/2020   BUN 21 03/26/2020   CREATININE 0.71 03/26/2020   BILITOT 0.3 03/26/2020   ALKPHOS 44 10/12/2016   AST 22 03/26/2020  ALT 18 03/26/2020   PROT 6.9 03/26/2020   ALBUMIN 4.2 10/12/2016   CALCIUM 9.7 03/26/2020   GFRAA 101 03/26/2020   QFTBGOLDPLUS NEGATIVE 07/07/2017    Speciality Comments: PLQ Eye Exam: 10/10/2019 WNL @ [facility] Follow up 1 year.  Procedures:  No procedures performed Allergies: Codeine, Flagyl [metronidazole hcl], Nexium [esomeprazole magnesium], Imuran [azathioprine], Sulfa antibiotics, and Sulfa drugs cross reactors   Assessment / Plan:     Visit Diagnoses: Dermatomyositis ([place]) - Jo 1+, Positive muscle biopsy for dermatomyositis: Clinically doing well on methotrexate, folic acid and prednisone combination.  Her labs are stable.  We discussed decreasing prednisone by 0.5 mg every few months as tolerated.  Sjogren's syndrome with keratoconjunctivitis sicca (H[facility])-she has been using over-the-counter products which has been helpful.  High risk medication use - MTX 0.8 ml every 7 days, folic acid 2mg  po qd, Plaquenil 200 mg 1 tablet BID,prednisone 3.0 mg every day. PLQ Eye Exam: 10/10/2019.  Her labs are stable.  She will get labs in March and then every 3 months to monitor for drug toxicity.  She is fully vaccinated against COVID-19.  She also received a booster.  Use  of mask, social distancing and hand hygiene was discussed.  Chronic pain of left knee-she was diagnosed with meniscal tear and hairline fracture which is healed.  She had no warmth swelling or effusion in her knee joint today.  Primary osteoarthritis of both knees-she continues to have some discomfort.  Primary osteoarthritis of both hands-joint protection was discussed.  Age-related osteoporosis without current pathological fracture - DEXA on 06/02/2018: AP spine T score -2.7 with BMD 0.95.  -15.2% change from previous DEXA.  She does not want to start on any medications until her next DEXA.  Thymoma  Hair loss  History of vitamin D deficiency-she is on vitamin D supplement.  Former smoker  Orders: No orders of the defined types were placed in this encounter.  No orders of the defined types were placed in this encounter.     Follow-Up Instructions: Return in about 3 months (around 06/25/2020) for Dermatomyositis.   Bo Merino, MD  Note - This record has been created using Editor, commissioning.  Chart creation errors have been sought, but may not always  have been located. Such creation errors do not reflect on  the standard of medical care.

## 2020-03-18 DIAGNOSIS — D485 Neoplasm of uncertain behavior of skin: Secondary | ICD-10-CM | POA: Diagnosis not present

## 2020-03-18 DIAGNOSIS — L718 Other rosacea: Secondary | ICD-10-CM | POA: Diagnosis not present

## 2020-03-18 DIAGNOSIS — Z85828 Personal history of other malignant neoplasm of skin: Secondary | ICD-10-CM | POA: Diagnosis not present

## 2020-03-18 DIAGNOSIS — D225 Melanocytic nevi of trunk: Secondary | ICD-10-CM | POA: Diagnosis not present

## 2020-03-18 DIAGNOSIS — L308 Other specified dermatitis: Secondary | ICD-10-CM | POA: Diagnosis not present

## 2020-03-23 ENCOUNTER — Encounter: Payer: Self-pay | Admitting: Rheumatology

## 2020-03-25 ENCOUNTER — Other Ambulatory Visit: Payer: Self-pay | Admitting: *Deleted

## 2020-03-25 DIAGNOSIS — Z79899 Other long term (current) drug therapy: Secondary | ICD-10-CM

## 2020-03-25 DIAGNOSIS — M3390 Dermatopolymyositis, unspecified, organ involvement unspecified: Secondary | ICD-10-CM

## 2020-03-26 DIAGNOSIS — Z79899 Other long term (current) drug therapy: Secondary | ICD-10-CM | POA: Diagnosis not present

## 2020-03-26 DIAGNOSIS — M3390 Dermatopolymyositis, unspecified, organ involvement unspecified: Secondary | ICD-10-CM | POA: Diagnosis not present

## 2020-03-27 ENCOUNTER — Encounter: Payer: Self-pay | Admitting: Rheumatology

## 2020-03-27 ENCOUNTER — Ambulatory Visit (INDEPENDENT_AMBULATORY_CARE_PROVIDER_SITE_OTHER): Payer: Medicare Other | Admitting: Rheumatology

## 2020-03-27 ENCOUNTER — Other Ambulatory Visit: Payer: Self-pay

## 2020-03-27 VITALS — BP 136/67 | HR 76 | Resp 12 | Ht 66.5 in | Wt 171.0 lb

## 2020-03-27 DIAGNOSIS — M170 Bilateral primary osteoarthritis of knee: Secondary | ICD-10-CM

## 2020-03-27 DIAGNOSIS — M19041 Primary osteoarthritis, right hand: Secondary | ICD-10-CM

## 2020-03-27 DIAGNOSIS — M25562 Pain in left knee: Secondary | ICD-10-CM | POA: Diagnosis not present

## 2020-03-27 DIAGNOSIS — M3390 Dermatopolymyositis, unspecified, organ involvement unspecified: Secondary | ICD-10-CM | POA: Diagnosis not present

## 2020-03-27 DIAGNOSIS — M3501 Sicca syndrome with keratoconjunctivitis: Secondary | ICD-10-CM

## 2020-03-27 DIAGNOSIS — Z87891 Personal history of nicotine dependence: Secondary | ICD-10-CM | POA: Diagnosis not present

## 2020-03-27 DIAGNOSIS — Z79899 Other long term (current) drug therapy: Secondary | ICD-10-CM

## 2020-03-27 DIAGNOSIS — D4989 Neoplasm of unspecified behavior of other specified sites: Secondary | ICD-10-CM

## 2020-03-27 DIAGNOSIS — G8929 Other chronic pain: Secondary | ICD-10-CM | POA: Diagnosis not present

## 2020-03-27 DIAGNOSIS — M810 Age-related osteoporosis without current pathological fracture: Secondary | ICD-10-CM | POA: Diagnosis not present

## 2020-03-27 DIAGNOSIS — L659 Nonscarring hair loss, unspecified: Secondary | ICD-10-CM

## 2020-03-27 DIAGNOSIS — Z8639 Personal history of other endocrine, nutritional and metabolic disease: Secondary | ICD-10-CM | POA: Diagnosis not present

## 2020-03-27 DIAGNOSIS — M19042 Primary osteoarthritis, left hand: Secondary | ICD-10-CM

## 2020-03-27 LAB — COMPLETE METABOLIC PANEL WITH GFR
AG Ratio: 1.9 (calc) (ref 1.0–2.5)
ALT: 18 U/L (ref 6–29)
AST: 22 U/L (ref 10–35)
Albumin: 4.5 g/dL (ref 3.6–5.1)
Alkaline phosphatase (APISO): 74 U/L (ref 37–153)
BUN: 21 mg/dL (ref 7–25)
CO2: 29 mmol/L (ref 20–32)
Calcium: 9.7 mg/dL (ref 8.6–10.4)
Chloride: 106 mmol/L (ref 98–110)
Creat: 0.71 mg/dL (ref 0.50–0.99)
GFR, Est African American: 101 mL/min/{1.73_m2} (ref >=60)
GFR, Est Non African American: 87 mL/min/{1.73_m2} (ref >=60)
Globulin: 2.4 g/dL (calc) (ref 1.9–3.7)
Glucose, Bld: 75 mg/dL (ref 65–99)
Potassium: 4.8 mmol/L (ref 3.5–5.3)
Sodium: 143 mmol/L (ref 135–146)
Total Bilirubin: 0.3 mg/dL (ref 0.2–1.2)
Total Protein: 6.9 g/dL (ref 6.1–8.1)

## 2020-03-27 LAB — CBC WITH DIFFERENTIAL/PLATELET
Absolute Monocytes: 585 cells/uL (ref 200–950)
Basophils Absolute: 54 cells/uL (ref 0–200)
Basophils Relative: 0.7 %
Eosinophils Absolute: 285 cells/uL (ref 15–500)
Eosinophils Relative: 3.7 %
HCT: 44.2 % (ref 35.0–45.0)
Hemoglobin: 14.7 g/dL (ref 11.7–15.5)
Lymphs Abs: 1548 cells/uL (ref 850–3900)
MCH: 31.4 pg (ref 27.0–33.0)
MCHC: 33.3 g/dL (ref 32.0–36.0)
MCV: 94.4 fL (ref 80.0–100.0)
MPV: 11.9 fL (ref 7.5–12.5)
Monocytes Relative: 7.6 %
Neutro Abs: 5228 cells/uL (ref 1500–7800)
Neutrophils Relative %: 67.9 %
Platelets: 158 10*3/uL (ref 140–400)
RBC: 4.68 10*6/uL (ref 3.80–5.10)
RDW: 13.4 % (ref 11.0–15.0)
Total Lymphocyte: 20.1 %
WBC: 7.7 10*3/uL (ref 3.8–10.8)

## 2020-03-27 LAB — CK: Total CK: 55 U/L (ref 29–143)

## 2020-03-27 NOTE — Progress Notes (Signed)
CBC, CMP and CK are normal.

## 2020-03-27 NOTE — Patient Instructions (Signed)
Standing Labs We placed an order today for your standing lab work.   Please have your standing labs drawn in March and every 3 months    If possible, please have your labs drawn 2 weeks prior to your appointment so that the provider can discuss your results at your appointment.  We have open lab daily Monday through Thursday from 8:30-12:30 PM and 1:30-4:30 PM and Friday from 8:30-12:30 PM and 1:30-4:00 PM at the office of Dr. Kinza Gouveia, [facility].   Please be advised, patients with office appointments requiring lab work will take precedents over walk-in lab work.  If possible, please come for your lab work on Monday and Friday afternoons, as you may experience shorter wait times. The office is located at [address], [address], [place], [place] [address] No appointment is necessary.   Labs are drawn by [facility]. Please bring your co-pay at the time of your lab draw.  You may receive a bill from [facility] for your lab work.  If you wish to have your labs drawn at another location, please call the office 24 hours in advance to send orders.  If you have any questions regarding directions or hours of operation,  please call [phone].   As a reminder, please drink plenty of water prior to coming for your lab work. Thanks!   

## 2020-03-28 DIAGNOSIS — E782 Mixed hyperlipidemia: Secondary | ICD-10-CM | POA: Diagnosis not present

## 2020-03-28 DIAGNOSIS — K2100 Gastro-esophageal reflux disease with esophagitis, without bleeding: Secondary | ICD-10-CM | POA: Diagnosis not present

## 2020-03-28 DIAGNOSIS — E039 Hypothyroidism, unspecified: Secondary | ICD-10-CM | POA: Diagnosis not present

## 2020-03-28 DIAGNOSIS — E119 Type 2 diabetes mellitus without complications: Secondary | ICD-10-CM | POA: Diagnosis not present

## 2020-04-03 DIAGNOSIS — C37 Malignant neoplasm of thymus: Secondary | ICD-10-CM | POA: Diagnosis not present

## 2020-04-03 DIAGNOSIS — E8881 Metabolic syndrome: Secondary | ICD-10-CM | POA: Diagnosis not present

## 2020-04-03 DIAGNOSIS — K7581 Nonalcoholic steatohepatitis (NASH): Secondary | ICD-10-CM | POA: Diagnosis not present

## 2020-04-03 DIAGNOSIS — E782 Mixed hyperlipidemia: Secondary | ICD-10-CM | POA: Diagnosis not present

## 2020-04-03 DIAGNOSIS — R7301 Impaired fasting glucose: Secondary | ICD-10-CM | POA: Diagnosis not present

## 2020-04-03 DIAGNOSIS — M3390 Dermatopolymyositis, unspecified, organ involvement unspecified: Secondary | ICD-10-CM | POA: Diagnosis not present

## 2020-04-03 DIAGNOSIS — Z0001 Encounter for general adult medical examination with abnormal findings: Secondary | ICD-10-CM | POA: Diagnosis not present

## 2020-04-03 DIAGNOSIS — I700 Atherosclerosis of aorta: Secondary | ICD-10-CM | POA: Diagnosis not present

## 2020-05-04 DIAGNOSIS — E119 Type 2 diabetes mellitus without complications: Secondary | ICD-10-CM | POA: Diagnosis not present

## 2020-05-04 DIAGNOSIS — E039 Hypothyroidism, unspecified: Secondary | ICD-10-CM | POA: Diagnosis not present

## 2020-05-04 DIAGNOSIS — M1712 Unilateral primary osteoarthritis, left knee: Secondary | ICD-10-CM | POA: Diagnosis not present

## 2020-05-04 DIAGNOSIS — E7849 Other hyperlipidemia: Secondary | ICD-10-CM | POA: Diagnosis not present

## 2020-05-24 ENCOUNTER — Encounter: Payer: Self-pay | Admitting: Rheumatology

## 2020-05-24 ENCOUNTER — Telehealth: Payer: Self-pay | Admitting: Rheumatology

## 2020-05-24 NOTE — Telephone Encounter (Signed)
Patient may come in for CK.

## 2020-05-24 NOTE — Telephone Encounter (Signed)
Patient calling because husband tested positive for COVID Wednesday. Patient not feeling well now, fever, body aches, etc. Patient took a test yesterday which was negative. Patient has had vaccines , and booster. Please call to advise.

## 2020-05-24 NOTE — Telephone Encounter (Signed)
Patient advised she may go to the lab and have CK performed. Patient states she will call to let us know about her Covid results to let us know if she needs to go to have them done.

## 2020-05-24 NOTE — Telephone Encounter (Signed)
Patient advised she should retest for COVID-19 five days after the exposure. Patient advised she may hold methotrexate and continue Plaquenil. Patient expressed understanding.

## 2020-05-24 NOTE — Telephone Encounter (Signed)
Patient should retest for COVID-19 five days after the exposure.  She may hold methotrexate and continue Plaquenil.

## 2020-05-29 ENCOUNTER — Other Ambulatory Visit: Payer: Self-pay | Admitting: Rheumatology

## 2020-05-29 ENCOUNTER — Telehealth: Payer: Self-pay

## 2020-05-29 NOTE — Telephone Encounter (Signed)
Last Visit: 03/27/2020 Next Visit: 06/26/2020 Labs: 03/26/2020 CBC, CMP and CK are normal.  Current Dose per office note 03/27/2020: MTX 0.8 ml every 7 days DX: Dermatomyositis  Last Fill: 02/27/2020  Okay to refill per Dr. Estanislado Pandy

## 2020-05-29 NOTE — Telephone Encounter (Signed)
Patient called stating she tested positive for COVID on Monday, 05/27/20.  Patient states she is feeling much better and doesn't have a fever anymore.  Patient is requesting a return call to let her know when she can restart her Methotrexate.

## 2020-05-30 NOTE — Telephone Encounter (Signed)
Patient advised to hold her MTX for 2-3 weeks after symptoms resolve. Patient expressed understanding.

## 2020-05-30 NOTE — Telephone Encounter (Signed)
Attempted to contact the patient and left message for patient to call the office.  

## 2020-06-03 DIAGNOSIS — E119 Type 2 diabetes mellitus without complications: Secondary | ICD-10-CM | POA: Diagnosis not present

## 2020-06-03 DIAGNOSIS — E7849 Other hyperlipidemia: Secondary | ICD-10-CM | POA: Diagnosis not present

## 2020-06-03 DIAGNOSIS — M1712 Unilateral primary osteoarthritis, left knee: Secondary | ICD-10-CM | POA: Diagnosis not present

## 2020-06-03 DIAGNOSIS — E039 Hypothyroidism, unspecified: Secondary | ICD-10-CM | POA: Diagnosis not present

## 2020-06-04 DIAGNOSIS — M8588 Other specified disorders of bone density and structure, other site: Secondary | ICD-10-CM | POA: Diagnosis not present

## 2020-06-04 DIAGNOSIS — M810 Age-related osteoporosis without current pathological fracture: Secondary | ICD-10-CM | POA: Diagnosis not present

## 2020-06-04 DIAGNOSIS — M85852 Other specified disorders of bone density and structure, left thigh: Secondary | ICD-10-CM | POA: Diagnosis not present

## 2020-06-08 ENCOUNTER — Other Ambulatory Visit: Payer: Self-pay | Admitting: Rheumatology

## 2020-06-08 ENCOUNTER — Encounter: Payer: Self-pay | Admitting: Rheumatology

## 2020-06-08 DIAGNOSIS — M3501 Sicca syndrome with keratoconjunctivitis: Secondary | ICD-10-CM

## 2020-06-10 ENCOUNTER — Telehealth: Payer: Self-pay

## 2020-06-10 MED ORDER — HYDROXYCHLOROQUINE SULFATE 200 MG PO TABS: 200.0000 mg | ORAL_TABLET | Freq: Two times a day (BID) | ORAL | 0 refills | Status: DC

## 2020-06-10 NOTE — Telephone Encounter (Signed)
Last Visit: 03/27/2020 Next Visit: 06/26/2020 Labs: 03/26/2020, CBC, CMP and CK are normal. Eye exam: 10/10/2019  Current Dose per office note 03/27/2020, Plaquenil 200 mg 1 tablet BID DX: Dermatomyositis  Last Fill: 03/04/2020  Okay to refill Plaquenil?

## 2020-06-10 NOTE — Telephone Encounter (Signed)
Patient called to clarify when she could resume taking methotrexate after having COVID-19. Patient states she tested positive on 05/27/2020 and symptoms resolved by 06/02/2020. Patient advised to hold methotrexate for 2-3 weeks after symptoms resolve. Patient injects on Mondays and plans to resume methotrexate on 06/17/2020.

## 2020-06-13 NOTE — Progress Notes (Signed)
Office Visit Note  Patient: Marisa Orozco             Date of Birth: June 28, 1950           MRN: [id number]             PCP: Caryl Bis, MD Referring: Caryl Bis, MD Visit Date: 06/26/2020 Occupation: @GUAROCC @  Subjective:  Medication management.   History of Present Illness: Marisa Orozco is a 70 y.o. female with a history of dermatomyositis, Sjogren's and osteoarthritis.  She had COVID-19 virus infection in February 2022.  She did not get monoclonal antibody infusion.  She states the symptoms lasted for about 6 days and then resolved.  She denies of any episodes of rash.  She continues to have some discomfort in her knee joints.  She has been experiencing hair loss.  According the patient she had a repeat DEXA scan which showed a T score of -2.2.  Activities of Daily Living:  Patient reports morning stiffness for 0 minutes.   Patient Denies nocturnal pain.  Difficulty dressing/grooming: Denies Difficulty climbing stairs: Reports Difficulty getting out of chair: Denies Difficulty using hands for taps, buttons, cutlery, and/or writing: Denies  Review of Systems  Constitutional: Positive for fatigue.  HENT: Positive for mouth dryness. Negative for mouth sores and nose dryness.   Eyes: Negative for pain, itching and dryness.  Respiratory: Negative for shortness of breath and difficulty breathing.   Cardiovascular: Negative for chest pain and palpitations.  Gastrointestinal: Negative for blood in stool, constipation and diarrhea.  Endocrine: Negative for increased urination.  Genitourinary: Negative for difficulty urinating.  Musculoskeletal: Positive for arthralgias, joint pain, myalgias, muscle tenderness and myalgias. Negative for joint swelling and morning stiffness.  Skin: Positive for color change. Negative for rash and redness.  Allergic/Immunologic: Negative for susceptible to infections.  Neurological: Positive for light-headedness. Negative for dizziness,  numbness, headaches and memory loss.  Hematological: Positive for bruising/bleeding tendency.  Psychiatric/Behavioral: Negative for confusion.    PMFS History:  Patient Active Problem List   Diagnosis Date Noted   Hypothyroidism 07/26/2019   Primary osteoarthritis of right knee 02/03/2018   Primary osteoarthritis of both hands 09/08/2017   Sjogren's disease ([place]) 02/04/2016   Dermatomyositis ([place]) 02/04/2016   Former smoker 02/04/2016   Thymoma 02/04/2016   Fatigue 02/15/2012    Past Medical History:  Diagnosis Date   Allergy    Anxiety    Cancer ([place])    basal cell carcinoma - face   Dermatomyositis ([place]) 02/04/2016   Jo 1 Positive  Skin Bx- Subtle interface Dermatitis    Former smoker 02/04/2016   GERD (gastroesophageal reflux disease)    otc med prn   PONV (postoperative nausea and vomiting)    Sjogren's disease ([place]) 02/04/2016   Positive ANA, Positive Ro, Positive CCP, Negative RF, Parotitis, Positive Sicca   Sjogren's syndrome ([place])    from MD note    Family History  Problem Relation Age of Onset   COPD Mother    Heart disease Mother    Diabetes Father    Kidney disease Father    Epilepsy Daughter    Rectal cancer Maternal Uncle    Esophageal cancer Neg Hx    Stomach cancer Neg Hx    Ulcerative colitis Neg Hx    Past Surgical History:  Procedure Laterality Date   BREAST SURGERY     augmentation   BUNIONECTOMY     right foot   COLONOSCOPY  DIAGNOSTIC LAPAROSCOPY     DILATION AND CURETTAGE OF UTERUS  04/15/2011   Procedure: DILATATION AND CURETTAGE;  Surgeon: [name];  Location: [facility];  Service: Gynecology;  Laterality: N/A;   MUSCLE BIOPSY Left 01/02/2016   Procedure: MUSCLE BIOPSY LEFT DELTOID;  Surgeon: [name], MD;  Location: [facility];  Service: General;  Laterality: Left;  left deltoid muscle bx   NASAL SEPTUM SURGERY     SVD     x 2   TONSILLECTOMY     WISDOM TOOTH EXTRACTION      Social History   Social History Narrative   Not on file   Immunization History  Administered Date(s) Administered   Influenza, High Dose Seasonal PF 01/07/2018   Influenza-Unspecified 01/04/2014   PFIZER(Purple Top)SARS-COV-2 Vaccination 04/26/2019, 05/17/2019, 12/12/2019   Pneumococcal Conjugate-13 01/05/2016   Zoster Recombinat (Shingrix) 08/11/2017, 12/17/2017     Objective: Vital Signs: BP 125/75 (BP Location: Left Arm, Patient Position: Sitting, Cuff Size: Normal)    Pulse 87    Resp 15    Ht 5\' 6"  (1.676 m)    Wt 180 lb 6.4 oz (81.8 kg)    BMI 29.12 kg/m    Physical Exam Vitals and nursing note reviewed.  Constitutional:      Appearance: She is well-developed.  HENT:     Head: Normocephalic and atraumatic.  Eyes:     Conjunctiva/sclera: Conjunctivae normal.  Cardiovascular:     Rate and Rhythm: Normal rate and regular rhythm.     Heart sounds: Normal heart sounds.  Pulmonary:     Effort: Pulmonary effort is normal.     Breath sounds: Normal breath sounds.  Abdominal:     General: Bowel sounds are normal.     Palpations: Abdomen is soft.  Musculoskeletal:     Cervical back: Normal range of motion.  Lymphadenopathy:     Cervical: No cervical adenopathy.  Skin:    General: Skin is warm and dry.     Capillary Refill: Capillary refill takes less than 2 seconds.  Neurological:     Mental Status: She is alert and oriented to person, place, and time.  Psychiatric:        Behavior: Behavior normal.      Musculoskeletal Exam: C-spine was in good range of motion.  She has thoracic kyphosis.  Shoulder joints, elbow joints, wrist joints, MCPs PIPs and DIPs with good range of motion with no synovitis.  Hip joints, knee joints, ankles, MTPs and PIPs with good range of motion with no synovitis.  She had no muscular weakness or tenderness.  CDAI Exam: CDAI Score: -- Patient Global: --; Provider Global: -- Swollen: --; Tender: -- Joint Exam 06/26/2020   No  joint exam has been documented for this visit   There is currently no information documented on the homunculus. Go to the Rheumatology activity and complete the homunculus joint exam.  Investigation: No additional findings.  Imaging: No results found.  Recent Labs: Lab Results  Component Value Date   WBC 7.1 06/25/2020   HGB 14.2 06/25/2020   PLT 154 06/25/2020   NA 141 06/25/2020   K 4.3 06/25/2020   CL 107 06/25/2020   CO2 28 06/25/2020   GLUCOSE 93 06/25/2020   BUN 20 06/25/2020   CREATININE 0.80 06/25/2020   BILITOT 0.4 06/25/2020   ALKPHOS 44 10/12/2016   AST 15 06/25/2020   ALT 14 06/25/2020   PROT 6.6 06/25/2020   ALBUMIN 4.2 10/12/2016  CALCIUM 9.0 06/25/2020   GFRAA 87 06/25/2020   QFTBGOLDPLUS NEGATIVE 07/07/2017    Speciality Comments: PLQ Eye Exam: 10/10/2019 WNL @ [facility]. Follow up 1 year.  Procedures:  No procedures performed Allergies: Codeine, Flagyl [metronidazole hcl], Nexium [esomeprazole magnesium], Imuran [azathioprine], Sulfa antibiotics, and Sulfa drugs cross reactors   Assessment / Plan:     Visit Diagnoses: Dermatomyositis ([place]) - Jo 1+, Positive muscle biopsy for dermatomyositis: She is clinically doing well with no increased muscle weakness or tenderness.  Her CK done this month was normal.  Sjogren's syndrome with keratoconjunctivitis sicca (HCC)-over-the-counter products were discussed.  High risk medication use - MTX 0.8 ml every 7 days, folic acid 2mg  po qd, Plaquenil 200 mg 1 tablet BID,prednisone 3.0 mg every day. PLQ Eye Exam: 10/10/2019.  Her labs are stable.  We will continue to monitor labs every 3 months.  I advised her to reduce Plaquenil to 1 tablet p.o. twice daily Monday to Friday.  Primary osteoarthritis of both knees-she continues to have some chronic discomfort.  Primary osteoarthritis of both hands-she has stiffness in her hands but no synovitis.  Age-related osteoporosis without current pathological  fracture - DEXA on 06/02/2018: AP spine T score -2.7 with BMD 0.95.  -15.2% change from previous DEXA.  Patient states she had a repeat DEXA with a T score of -2.2.  She has been walking on a regular basis.  History of vitamin D deficiency-she is on calcium supplement.  Thymoma  Hair loss  Former smoker  COVID-19 virus infection-she developed an infection in February 2022.  She states the symptoms lasted for 5 days and then resolved.  She has received 3 doses of COVID-19 vaccination.  A fourth dose 6 months after the third dose was discussed.  Orders: No orders of the defined types were placed in this encounter.  No orders of the defined types were placed in this encounter.    Follow-Up Instructions: Return in about 3 months (around 09/26/2020) for Dermatomyositis.   Bo Merino, MD  Note - This record has been created using Editor, commissioning.  Chart creation errors have been sought, but may not always  have been located. Such creation errors do not reflect on  the standard of medical care.

## 2020-06-15 ENCOUNTER — Other Ambulatory Visit: Payer: Self-pay | Admitting: Rheumatology

## 2020-06-17 NOTE — Telephone Encounter (Signed)
Next Visit: 06/26/2020  Last Visit: 03/27/2020  Last Fill:05/29/2019  Dx:  Dermatomyositis   Current Dose per office note on 03/47/4259, folic acid 2mg  po qd   Okay to refill folic acid?

## 2020-06-19 DIAGNOSIS — E039 Hypothyroidism, unspecified: Secondary | ICD-10-CM | POA: Diagnosis not present

## 2020-06-20 LAB — TSH: TSH: 0.663 u[IU]/mL (ref 0.450–4.500)

## 2020-06-20 LAB — T4, FREE: Free T4: 1.43 ng/dL (ref 0.82–1.77)

## 2020-06-24 ENCOUNTER — Encounter: Payer: Self-pay | Admitting: Rheumatology

## 2020-06-24 ENCOUNTER — Other Ambulatory Visit: Payer: Self-pay

## 2020-06-24 DIAGNOSIS — M3390 Dermatopolymyositis, unspecified, organ involvement unspecified: Secondary | ICD-10-CM

## 2020-06-24 DIAGNOSIS — Z79899 Other long term (current) drug therapy: Secondary | ICD-10-CM

## 2020-06-25 ENCOUNTER — Encounter: Payer: Self-pay | Admitting: "Endocrinology

## 2020-06-25 ENCOUNTER — Ambulatory Visit (INDEPENDENT_AMBULATORY_CARE_PROVIDER_SITE_OTHER): Payer: Medicare Other | Admitting: "Endocrinology

## 2020-06-25 ENCOUNTER — Other Ambulatory Visit: Payer: Self-pay

## 2020-06-25 VITALS — BP 132/73 | HR 72 | Ht 66.5 in | Wt 179.2 lb

## 2020-06-25 DIAGNOSIS — M3390 Dermatopolymyositis, unspecified, organ involvement unspecified: Secondary | ICD-10-CM | POA: Diagnosis not present

## 2020-06-25 DIAGNOSIS — E039 Hypothyroidism, unspecified: Secondary | ICD-10-CM | POA: Diagnosis not present

## 2020-06-25 DIAGNOSIS — Z79899 Other long term (current) drug therapy: Secondary | ICD-10-CM | POA: Diagnosis not present

## 2020-06-25 LAB — CBC WITH DIFFERENTIAL/PLATELET
Absolute Monocytes: 653 cells/uL (ref 200–950)
Basophils Absolute: 43 cells/uL (ref 0–200)
Basophils Relative: 0.6 %
Eosinophils Absolute: 391 cells/uL (ref 15–500)
Eosinophils Relative: 5.5 %
HCT: 42.5 % (ref 35.0–45.0)
Hemoglobin: 14.2 g/dL (ref 11.7–15.5)
Lymphs Abs: 1626 cells/uL (ref 850–3900)
MCH: 31.1 pg (ref 27.0–33.0)
MCHC: 33.4 g/dL (ref 32.0–36.0)
MCV: 93 fL (ref 80.0–100.0)
MPV: 11.4 fL (ref 7.5–12.5)
Monocytes Relative: 9.2 %
Neutro Abs: 4388 cells/uL (ref 1500–7800)
Neutrophils Relative %: 61.8 %
Platelets: 154 10*3/uL (ref 140–400)
RBC: 4.57 10*6/uL (ref 3.80–5.10)
RDW: 12.7 % (ref 11.0–15.0)
Total Lymphocyte: 22.9 %
WBC: 7.1 10*3/uL (ref 3.8–10.8)

## 2020-06-25 LAB — COMPLETE METABOLIC PANEL WITH GFR
AG Ratio: 1.6 (calc) (ref 1.0–2.5)
ALT: 14 U/L (ref 6–29)
AST: 15 U/L (ref 10–35)
Albumin: 4.1 g/dL (ref 3.6–5.1)
Alkaline phosphatase (APISO): 68 U/L (ref 37–153)
BUN: 20 mg/dL (ref 7–25)
CO2: 28 mmol/L (ref 20–32)
Calcium: 9 mg/dL (ref 8.6–10.4)
Chloride: 107 mmol/L (ref 98–110)
Creat: 0.8 mg/dL (ref 0.50–0.99)
GFR, Est African American: 87 mL/min/{1.73_m2} (ref >=60)
GFR, Est Non African American: 75 mL/min/{1.73_m2} (ref >=60)
Globulin: 2.5 g/dL (calc) (ref 1.9–3.7)
Glucose, Bld: 93 mg/dL (ref 65–99)
Potassium: 4.3 mmol/L (ref 3.5–5.3)
Sodium: 141 mmol/L (ref 135–146)
Total Bilirubin: 0.4 mg/dL (ref 0.2–1.2)
Total Protein: 6.6 g/dL (ref 6.1–8.1)

## 2020-06-25 LAB — CK: Total CK: 51 U/L (ref 29–143)

## 2020-06-25 MED ORDER — LEVOTHYROXINE SODIUM 50 MCG PO TABS
ORAL_TABLET | ORAL | 1 refills | Status: DC
Start: 2020-06-25 — End: 2020-10-02

## 2020-06-25 NOTE — Progress Notes (Signed)
06/25/2020, 12:43 PM       Endocrinology follow-up note   Marisa Orozco is a 70 y.o.-year-old female patient being seen in follow-up for hypothyroidism. PMD:  Caryl Bis, MD.   Past Medical History:  Diagnosis Date  . Allergy   . Anxiety   . Cancer ([place])    basal cell carcinoma - face  . Dermatomyositis ([place]) 02/04/2016   Jo 1 Positive  Skin Bx- Subtle interface Dermatitis   . Former smoker 02/04/2016  . GERD (gastroesophageal reflux disease)    otc med prn  . PONV (postoperative nausea and vomiting)   . Sjogren's disease ([place]) 02/04/2016   Positive ANA, Positive Ro, Positive CCP, Negative RF, Parotitis, Positive Sicca  . Sjogren's syndrome ([place])    from MD note    Past Surgical History:  Procedure Laterality Date  . BREAST SURGERY     augmentation  . BUNIONECTOMY     right foot  . COLONOSCOPY    . DIAGNOSTIC LAPAROSCOPY    . DILATION AND CURETTAGE OF UTERUS  04/15/2011   Procedure: DILATATION AND CURETTAGE;  Surgeon: [name];  Location: [facility];  Service: Gynecology;  Laterality: N/A;  . MUSCLE BIOPSY Left 01/02/2016   Procedure: MUSCLE BIOPSY LEFT DELTOID;  Surgeon: [name], MD;  Location: [facility];  Service: General;  Laterality: Left;  left deltoid muscle bx  . NASAL SEPTUM SURGERY    . SVD     x 2  . TONSILLECTOMY    . WISDOM TOOTH EXTRACTION      Social History   Socioeconomic History  . Marital status: Married    Spouse name: Not on file  . Number of children: Not on file  . Years of education: Not on file  . Highest education level: Not on file  Occupational History  . Not on file  Tobacco Use  . Smoking status: Former Smoker    Packs/day: 1.00    Years: 25.00    Pack years: 25.00    Types: Cigarettes    Quit date: 08/05/2003    Years since quitting: 16.9  . Smokeless tobacco: Never Used  Vaping  Use  . Vaping Use: Never used  Substance and Sexual Activity  . Alcohol use: Yes    Comment: socially - wine  . Drug use: No  . Sexual activity: Yes    Birth control/protection: None  Other Topics Concern  . Not on file  Social History Narrative  . Not on file   Social Determinants of Health   Financial Resource Strain: Not on file  Food Insecurity: Not on file  Transportation Needs: Not on file  Physical Activity: Not on file  Stress: Not on file  Social Connections: Not on file    Family History  Problem Relation Age of Onset  . COPD Mother   .  Heart disease Mother   . Diabetes Father   . Kidney disease Father   . Epilepsy Daughter   . Rectal cancer Maternal Uncle   . Esophageal cancer Neg Hx   . Stomach cancer Neg Hx   . Ulcerative colitis Neg Hx     Outpatient Encounter Medications as of 06/25/2020  Medication Sig  . folic acid (FOLVITE) 1 MG tablet Take 2 tablets (2 mg total) by mouth daily.  . Cholecalciferol (VITAMIN D3) 1000 UNIT/SPRAY LIQD Take 5 sprays by mouth daily.   . hydroxychloroquine (PLAQUENIL) 200 MG tablet Take 1 tablet (200 mg total) by mouth 2 (two) times daily.  Marland Kitchen levothyroxine (SYNTHROID) 50 MCG tablet TAKE 1 TABLET BY MOUTH DAILY BEFORE BREAKFAST.  . methotrexate 50 MG/2ML injection INJECT 0.8ML INTO THE SKIN ONCE A WEEK  . predniSONE (DELTASONE) 1 MG tablet Take 4.5 tablets (4.5 mg total) by mouth daily with breakfast. (Patient taking differently: Take 3 mg by mouth daily with breakfast.)  . TUBERCULIN SYR 1CC/27GX1/2" 27G X 1/2" 1 ML MISC Patient to use weekly to inject Methotrexate  . VITAMIN K PO Take 1 tablet by mouth daily.   . [DISCONTINUED] levothyroxine (SYNTHROID) 50 MCG tablet TAKE 1 TABLET BY MOUTH DAILY BEFORE BREAKFAST.   No facility-administered encounter medications on file as of 06/25/2020.    ALLERGIES: Allergies  Allergen Reactions  . Codeine Nausea And Vomiting  . Flagyl [Metronidazole Hcl] Other (See Comments)     Reaction muscle weakness  . Nexium [Esomeprazole Magnesium] Other (See Comments)    Reaction Muscle weakness  . Imuran [Azathioprine] Other (See Comments)    Joint Pain  . Sulfa Antibiotics Rash  . Sulfa Drugs Cross Reactors Rash   VACCINATION STATUS: Immunization History  Administered Date(s) Administered  . Influenza, High Dose Seasonal PF 01/07/2018  . Influenza-Unspecified 01/04/2014  . PFIZER(Purple Top)SARS-COV-2 Vaccination 04/26/2019, 05/17/2019, 12/12/2019  . Pneumococcal Conjugate-13 01/05/2016  . Zoster Recombinat (Shingrix) 08/11/2017, 12/17/2017     HPI    Marisa Orozco  is a patient with the above medical history.  She is returning for follow-up for hypothyroidism.  She is on levothyroxine 50 mcg p.o. daily before breakfast.  She reports compliance to this medication.    She has no new complaints today, except for hair loss which she is working on with dermatologist.  She also have kyphotic deformity on her cervical spine.    -She has lost 5 pounds since last visit.  She remains on a tapering dose of prednisone given for rheumatology conditions including Sjogren's syndrome.  She is currently taking 3 mg p.o. daily.   She has osteoporosis on alendronate through her PMD.  She reports that her last DEXA was showing improvement compared to her DEXA in 2020.   She denies heat/cold intolerance.  She denies dysphagia, shortness of breath, no voice change.  She has family history of thyroid dysfunction of what appears to be hypothyroidism in her mother.  -She is also on methotrexate/Plaquenil in addition to on and off prednisone.   Pt denies feeling nodules in neck, No family history of thyroid cancer.  No history of  radiation therapy to head or neck. No recent use of iodine supplements.   ROS:  Limited as above.   Physical Exam: BP 132/73   Pulse 72   Ht 5' 6.5" (1.689 m)   Wt 179 lb 3.2 oz (81.3 kg)   BMI 28.49 kg/m  Wt Readings from Last 3 Encounters:   06/25/20 179 lb  3.2 oz (81.3 kg)  03/27/20 171 lb (77.6 kg)  12/26/19 184 lb 3.2 oz (83.6 kg)    CMP ( most recent) CMP     Component Value Date/Time   NA 143 03/26/2020 0836   NA 137 01/16/2016 0000   K 4.8 03/26/2020 0836   CL 106 03/26/2020 0836   CO2 29 03/26/2020 0836   GLUCOSE 75 03/26/2020 0836   BUN 21 03/26/2020 0836   BUN 11 01/16/2016 0000   CREATININE 0.71 03/26/2020 0836   CALCIUM 9.7 03/26/2020 0836   PROT 6.9 03/26/2020 0836   ALBUMIN 4.2 10/12/2016 1202   AST 22 03/26/2020 0836   ALT 18 03/26/2020 0836   ALKPHOS 44 10/12/2016 1202   BILITOT 0.3 03/26/2020 0836   GFRNONAA 87 03/26/2020 0836   GFRAA 101 03/26/2020 0836    Lab Results  Component Value Date   TSH 0.663 06/19/2020   TSH 2.63 12/20/2019   TSH 2.43 07/25/2019   TSH 4.89 03/25/2018   TSH 4.18 03/25/2018   TSH 1.16 09/01/2016   TSH 0.87 02/18/2016   FREET4 1.43 06/19/2020   FREET4 1.1 12/20/2019   FREET4 1.1 07/25/2019       ASSESSMENT: 1. Hypothyroidism  PLAN:   -Her previsit thyroid function tests are consistent with appropriate replacement.  She is advised to continue  levothyroxine 50 mcg p.o. daily before breakfast.    - We discussed about the correct intake of her thyroid hormone, on empty stomach at fasting, with water, separated by at least 30 minutes from breakfast and other medications,  and separated by more than 4 hours from calcium, iron, multivitamins, acid reflux medications (PPIs). -Patient is made aware of the fact that thyroid hormone replacement is needed for life, dose to be adjusted by periodic monitoring of thyroid function tests.   -Due to absence of clinical goiter, no need for thyroid ultrasound. -Her ongoing exposure to steroids related to her other rheumatological conditions is interfering with her ability to lose more weight.  Also putting her at risk for worsening osteoporosis.  She is advised to continue treatment for osteoporosis through her PMD.  More  importantly, she is at risk of adrenal insufficiency if steroids are withdrawn suddenly.  Tapering off if possible is always advisable. Posture correcting strap for mechanical support of her spine is suggested.  She is advised to maintain close follow-up with her PMD, and her rheumatologist.    - Time spent on this patient care encounter:  30 minutes of which 50% was spent in  counseling and the rest reviewing  her current and  previous labs / studies and medications  doses and developing a plan for long term care, and documenting this care. [name]  participated in the discussions, expressed understanding, and voiced agreement with the above plans.  All questions were answered to her satisfaction. she is encouraged to contact clinic should she have any questions or concerns prior to her return visit.   Return in about 6 months (around 12/26/2020) for F/U with Pre-visit Labs.  Glade Lloyd, MD [facility] [address] [place], [place] [address] Phone: [phone]  Fax: [phone]   06/25/2020, 12:43 PM  This note was partially dictated with voice recognition software. Similar sounding words can be transcribed inadequately or may not  be corrected upon review.

## 2020-06-26 ENCOUNTER — Encounter: Payer: Self-pay | Admitting: Rheumatology

## 2020-06-26 ENCOUNTER — Ambulatory Visit (INDEPENDENT_AMBULATORY_CARE_PROVIDER_SITE_OTHER): Payer: Medicare Other | Admitting: Rheumatology

## 2020-06-26 VITALS — BP 125/75 | HR 87 | Resp 15 | Ht 66.0 in | Wt 180.4 lb

## 2020-06-26 DIAGNOSIS — M170 Bilateral primary osteoarthritis of knee: Secondary | ICD-10-CM | POA: Diagnosis not present

## 2020-06-26 DIAGNOSIS — L659 Nonscarring hair loss, unspecified: Secondary | ICD-10-CM | POA: Diagnosis not present

## 2020-06-26 DIAGNOSIS — D4989 Neoplasm of unspecified behavior of other specified sites: Secondary | ICD-10-CM

## 2020-06-26 DIAGNOSIS — M3390 Dermatopolymyositis, unspecified, organ involvement unspecified: Secondary | ICD-10-CM | POA: Diagnosis not present

## 2020-06-26 DIAGNOSIS — M3501 Sicca syndrome with keratoconjunctivitis: Secondary | ICD-10-CM

## 2020-06-26 DIAGNOSIS — U071 COVID-19: Secondary | ICD-10-CM | POA: Diagnosis not present

## 2020-06-26 DIAGNOSIS — M19041 Primary osteoarthritis, right hand: Secondary | ICD-10-CM | POA: Diagnosis not present

## 2020-06-26 DIAGNOSIS — M810 Age-related osteoporosis without current pathological fracture: Secondary | ICD-10-CM

## 2020-06-26 DIAGNOSIS — G8929 Other chronic pain: Secondary | ICD-10-CM

## 2020-06-26 DIAGNOSIS — Z79899 Other long term (current) drug therapy: Secondary | ICD-10-CM | POA: Diagnosis not present

## 2020-06-26 DIAGNOSIS — Z8639 Personal history of other endocrine, nutritional and metabolic disease: Secondary | ICD-10-CM

## 2020-06-26 DIAGNOSIS — M19042 Primary osteoarthritis, left hand: Secondary | ICD-10-CM

## 2020-06-26 DIAGNOSIS — Z87891 Personal history of nicotine dependence: Secondary | ICD-10-CM

## 2020-06-26 NOTE — Progress Notes (Signed)
CBC, CMP and CK are normal.

## 2020-06-26 NOTE — Patient Instructions (Signed)
Standing Labs We placed an order today for your standing lab work.   Please have your standing labs drawn in June and every 3 months   If possible, please have your labs drawn 2 weeks prior to your appointment so that the provider can discuss your results at your appointment.  We have open lab daily Monday through Thursday from 1:30-4:30 PM and Friday from 1:30-4:00 PM at the office of Dr. Laroy Mustard, [facility].   Please be advised, all patients with office appointments requiring lab work will take precedents over walk-in lab work.  If possible, please come for your lab work on Monday and Friday afternoons, as you may experience shorter wait times. The office is located at [address], [address], , [place] [address] No appointment is necessary.   Labs are drawn by [facility]. Please bring your co-pay at the time of your lab draw.  You may receive a bill from [facility] for your lab work.  If you wish to have your labs drawn at another location, please call the office 24 hours in advance to send orders.  If you have any questions regarding directions or hours of operation,  please call [phone].   As a reminder, please drink plenty of water prior to coming for your lab work. Thanks!   

## 2020-06-30 DIAGNOSIS — Z20822 Contact with and (suspected) exposure to covid-19: Secondary | ICD-10-CM | POA: Diagnosis not present

## 2020-07-01 DIAGNOSIS — J019 Acute sinusitis, unspecified: Secondary | ICD-10-CM | POA: Diagnosis not present

## 2020-07-01 DIAGNOSIS — M3501 Sicca syndrome with keratoconjunctivitis: Secondary | ICD-10-CM | POA: Diagnosis not present

## 2020-07-01 DIAGNOSIS — M3390 Dermatopolymyositis, unspecified, organ involvement unspecified: Secondary | ICD-10-CM | POA: Diagnosis not present

## 2020-07-03 DIAGNOSIS — E039 Hypothyroidism, unspecified: Secondary | ICD-10-CM | POA: Diagnosis not present

## 2020-07-03 DIAGNOSIS — E119 Type 2 diabetes mellitus without complications: Secondary | ICD-10-CM | POA: Diagnosis not present

## 2020-07-03 DIAGNOSIS — E7849 Other hyperlipidemia: Secondary | ICD-10-CM | POA: Diagnosis not present

## 2020-07-08 DIAGNOSIS — Z85828 Personal history of other malignant neoplasm of skin: Secondary | ICD-10-CM | POA: Diagnosis not present

## 2020-07-08 DIAGNOSIS — L570 Actinic keratosis: Secondary | ICD-10-CM | POA: Diagnosis not present

## 2020-07-08 DIAGNOSIS — L649 Androgenic alopecia, unspecified: Secondary | ICD-10-CM | POA: Diagnosis not present

## 2020-07-22 ENCOUNTER — Other Ambulatory Visit: Payer: Self-pay | Admitting: Rheumatology

## 2020-07-23 MED ORDER — PREDNISONE 1 MG PO TABS
ORAL_TABLET | ORAL | 2 refills | Status: DC
Start: 2020-07-23 — End: 2020-10-02

## 2020-07-23 NOTE — Telephone Encounter (Signed)
Next Visit: 10/02/2020  Last Visit: 06/26/2020  Last Fill: 03/04/2020  Dx: Dermatomyositis  Current Dose per office note on 06/26/2020, prednisone 3.0 mg every day  Okay to refill prednisone?

## 2020-08-11 ENCOUNTER — Other Ambulatory Visit: Payer: Self-pay | Admitting: Rheumatology

## 2020-08-12 NOTE — Telephone Encounter (Signed)
Next Visit: 10/02/2020  Last Visit: 06/26/2020  Last Fill: 05/29/2020  DX: Dermatomyositis   Current Dose per office note 06/26/2020, MTX 0.8 ml every 7 days  Labs: 06/25/2020, CBC, CMP and CK are normal.  Okay to refill MTX?

## 2020-08-15 DIAGNOSIS — L578 Other skin changes due to chronic exposure to nonionizing radiation: Secondary | ICD-10-CM | POA: Diagnosis not present

## 2020-08-19 ENCOUNTER — Encounter: Payer: Self-pay | Admitting: Rheumatology

## 2020-08-19 ENCOUNTER — Telehealth: Payer: Self-pay

## 2020-08-19 ENCOUNTER — Other Ambulatory Visit: Payer: Self-pay

## 2020-08-19 DIAGNOSIS — E039 Hypothyroidism, unspecified: Secondary | ICD-10-CM

## 2020-08-19 DIAGNOSIS — Z79899 Other long term (current) drug therapy: Secondary | ICD-10-CM

## 2020-08-19 DIAGNOSIS — M3390 Dermatopolymyositis, unspecified, organ involvement unspecified: Secondary | ICD-10-CM

## 2020-08-19 NOTE — Telephone Encounter (Signed)
During her March visit we kept her levothyroxine the same at 50 mcg ( last time it was adjusted was in September from 25 to 85). If it was further adjusted by some other provider , she has to let us know. Decision to adjust is only dependent on lab work. She can do her labs sooner to see if there is any need to adjust.

## 2020-08-19 NOTE — Telephone Encounter (Signed)
Patient said she has noticed since her levothyroxine was changed in march that she is having extreme hair loss/ nails are breaking and peeling. Please Advise

## 2020-08-19 NOTE — Telephone Encounter (Signed)
Discussed with pt, understanding voiced. Orders sent to [facility] per pt request.

## 2020-08-20 DIAGNOSIS — Z79899 Other long term (current) drug therapy: Secondary | ICD-10-CM | POA: Diagnosis not present

## 2020-08-20 DIAGNOSIS — M3390 Dermatopolymyositis, unspecified, organ involvement unspecified: Secondary | ICD-10-CM | POA: Diagnosis not present

## 2020-08-20 DIAGNOSIS — E039 Hypothyroidism, unspecified: Secondary | ICD-10-CM | POA: Diagnosis not present

## 2020-08-21 LAB — T4, FREE: Free T4: 1.1 ng/dL (ref 0.8–1.8)

## 2020-08-21 LAB — TSH: TSH: 2.99 mIU/L (ref 0.40–4.50)

## 2020-08-21 LAB — CK: Total CK: 50 U/L (ref 29–143)

## 2020-08-21 NOTE — Progress Notes (Signed)
CK is normal

## 2020-08-26 ENCOUNTER — Telehealth: Payer: Self-pay

## 2020-08-26 NOTE — Telephone Encounter (Signed)
Tried to call pt, telephone line busy at time of call. Will try again at a later time.

## 2020-08-26 NOTE — Telephone Encounter (Signed)
-----   Message from Cassandria Anger, MD sent at 08/26/2020  1:42 PM EDT ----- Marisa Orozco, Would you inform this patient that her current dose of levothyroxine at 50 mcg is appropriate and no need to adjust? She will have another TSH/Free T4 before her next visit.

## 2020-08-27 NOTE — Progress Notes (Signed)
Left a message requesting pt to return a call to the office.

## 2020-08-27 NOTE — Telephone Encounter (Signed)
Discussed lab results with pt, she stated she understood her labs showed she is taking appropriate dose of levothyroxine but she is experiencing increased hair loss "I'm losing clumps of hair" also experiencing palpitations. States she is not experiencing any other flares and would like to stop her levothyroxine to see if her symptoms subside. Dr. Dorris Fetch made aware.

## 2020-09-18 NOTE — Progress Notes (Signed)
Office Visit Note  Patient: Marisa Orozco             Date of Birth: April 19, 1950           MRN: [id number]             PCP: Caryl Bis, MD Referring: Caryl Bis, MD Visit Date: 10/02/2020 Occupation: @GUAROCC @  Subjective:  Medication management.   History of Present Illness: Marisa Orozco is a 70 y.o. female with a history of dermatomyositis.  She states that she tried to decrease the dose of prednisone from 3 mg to 2.5 mg p.o. daily but discharge experiencing increased discomfort in her hips and knees.  She increase the dose back to 3 mg p.o. daily and is feeling better.  She also had some recent labs with her PCP.  She states her CK was 65.  She had COVID-19 infection in February and then another upper respiratory tract infection in March.  She states since then she has had hacking cough which is productive.  She has been having some left lower quadrant pain.  She is scheduled to have ultrasound of the ovary with her GYN per patient.  She states she had colonoscopy 4 years ago which was normal.  Activities of Daily Living:  Patient reports morning stiffness for 24 hours.   Patient Denies nocturnal pain.  Difficulty dressing/grooming: Denies Difficulty climbing stairs: Reports Difficulty getting out of chair: Denies Difficulty using hands for taps, buttons, cutlery, and/or writing: Denies  Review of Systems  Constitutional:  Negative for fatigue.  HENT:  Positive for mouth dryness and nose dryness. Negative for mouth sores.   Eyes:  Positive for dryness. Negative for pain, itching and visual disturbance.  Respiratory:  Positive for cough. Negative for hemoptysis, shortness of breath and difficulty breathing.   Cardiovascular:  Negative for chest pain, palpitations and swelling in legs/feet.  Gastrointestinal:  Negative for abdominal pain, blood in stool, constipation and diarrhea.  Endocrine: Negative for increased urination.  Genitourinary:  Negative for painful  urination.  Musculoskeletal:  Positive for joint pain, joint pain, morning stiffness and muscle tenderness. Negative for joint swelling, myalgias, muscle weakness and myalgias.  Skin:  Negative for color change, rash and redness.  Allergic/Immunologic: Negative for susceptible to infections.  Neurological:  Negative for dizziness, numbness, headaches, memory loss and weakness.  Hematological:  Negative for swollen glands.  Psychiatric/Behavioral:  Positive for sleep disturbance. Negative for confusion.    PMFS History:  Patient Active Problem List   Diagnosis Date Noted   Hypothyroidism 07/26/2019   Primary osteoarthritis of right knee 02/03/2018   Primary osteoarthritis of both hands 09/08/2017   Sjogren's disease ([place]) 02/04/2016   Dermatomyositis ([place]) 02/04/2016   Former smoker 02/04/2016   Thymoma 02/04/2016   Fatigue 02/15/2012    Past Medical History:  Diagnosis Date   Allergy    Anxiety    Cancer ([place])    basal cell carcinoma - face   Dermatomyositis ([place]) 02/04/2016   Jo 1 Positive  Skin Bx- Subtle interface Dermatitis    Former smoker 02/04/2016   GERD (gastroesophageal reflux disease)    otc med prn   PONV (postoperative nausea and vomiting)    Sjogren's disease ([place]) 02/04/2016   Positive ANA, Positive Ro, Positive CCP, Negative RF, Parotitis, Positive Sicca   Sjogren's syndrome ([place])    from MD note    Family History  Problem Relation Age of Onset   COPD Mother    Heart  disease Mother    Diabetes Father    Kidney disease Father    Epilepsy Daughter    Rectal cancer Maternal Uncle    Esophageal cancer Neg Hx    Stomach cancer Neg Hx    Ulcerative colitis Neg Hx    Past Surgical History:  Procedure Laterality Date   BREAST SURGERY     augmentation   BUNIONECTOMY     right foot   COLONOSCOPY     DIAGNOSTIC LAPAROSCOPY     DILATION AND CURETTAGE OF UTERUS  04/15/2011   Procedure: DILATATION AND CURETTAGE;  Surgeon: [name];  Location: [facility];   Service: Gynecology;  Laterality: N/A;   MUSCLE BIOPSY Left 01/02/2016   Procedure: MUSCLE BIOPSY LEFT DELTOID;  Surgeon: [name], MD;  Location: [facility];  Service: General;  Laterality: Left;  left deltoid muscle bx   NASAL SEPTUM SURGERY     SVD     x 2   TONSILLECTOMY     WISDOM TOOTH EXTRACTION     Social History   Social History Narrative   Not on file   Immunization History  Administered Date(s) Administered   Influenza, High Dose Seasonal PF 01/07/2018   Influenza-Unspecified 01/04/2014   PFIZER(Purple Top)SARS-COV-2 Vaccination 04/26/2019, 05/17/2019, 12/12/2019   Pneumococcal Conjugate-13 01/05/2016   Zoster Recombinat (Shingrix) 08/11/2017, 12/17/2017     Objective: Vital Signs: BP 111/72 (BP Location: Left Arm, Patient Position: Sitting, Cuff Size: Normal)   Pulse 75   Ht 5' 6.5" (1.689 m)   Wt 176 lb 6.4 oz (80 kg)   BMI 28.05 kg/m    Physical Exam Vitals and nursing note reviewed.  Constitutional:      Appearance: She is well-developed.  HENT:     Head: Normocephalic and atraumatic.  Eyes:     Conjunctiva/sclera: Conjunctivae normal.  Cardiovascular:     Rate and Rhythm: Normal rate and regular rhythm.     Heart sounds: Normal heart sounds.  Pulmonary:     Effort: Pulmonary effort is normal.     Breath sounds: Normal breath sounds.  Abdominal:     General: Bowel sounds are normal.     Palpations: Abdomen is soft.  Musculoskeletal:     Cervical back: Normal range of motion.  Lymphadenopathy:     Cervical: No cervical adenopathy.  Skin:    General: Skin is warm and dry.     Capillary Refill: Capillary refill takes less than 2 seconds.  Neurological:     Mental Status: She is alert and oriented to person, place, and time.  Psychiatric:        Behavior: Behavior normal.     Musculoskeletal Exam: C-spine was in good range of motion.  Shoulder joints, elbow joints, wrist joints, MCPs with good range of motion.  She had  bilateral PIP and DIP thickening with no synovitis.  Hip joints and knee joints with good range of motion.  She had no tenderness over ankles or MTPs.  She had no muscular weakness or tenderness.  CDAI Exam: CDAI Score: -- Patient Global: --; Provider Global: -- Swollen: --; Tender: -- Joint Exam 10/02/2020   No joint exam has been documented for this visit   There is currently no information documented on the homunculus. Go to the Rheumatology activity and complete the homunculus joint exam.  Investigation: No additional findings.  Imaging: No results found.  Recent Labs: Lab Results  Component Value Date   WBC 7.1 06/25/2020   HGB  14.2 06/25/2020   PLT 154 06/25/2020   NA 141 06/25/2020   K 4.3 06/25/2020   CL 107 06/25/2020   CO2 28 06/25/2020   GLUCOSE 93 06/25/2020   BUN 20 06/25/2020   CREATININE 0.80 06/25/2020   BILITOT 0.4 06/25/2020   ALKPHOS 44 10/12/2016   AST 15 06/25/2020   ALT 14 06/25/2020   PROT 6.6 06/25/2020   ALBUMIN 4.2 10/12/2016   CALCIUM 9.0 06/25/2020   GFRAA 87 06/25/2020   QFTBGOLDPLUS NEGATIVE 07/07/2017    September 30, 2020 CBC normal, CMP normal (sodium 146, chloride 108), LDL 73, HDL 47, TSH 3.7, CK 65  Speciality Comments: PLQ Eye Exam: 10/10/2019 WNL @ [facility]. Follow up 1 year.  Procedures:  No procedures performed Allergies: Codeine, Flagyl [metronidazole hcl], Nexium [esomeprazole magnesium], Imuran [azathioprine], Sulfa antibiotics, and Sulfa drugs cross reactors   Assessment / Plan:     Visit Diagnoses: Dermatomyositis ([place]) - Jo 1+, Positive muscle biopsy for dermatomyositis: She had no increased muscle weakness or tenderness on my examination today.  Her CK is a stable.  Sjogren's syndrome with keratoconjunctivitis sicca (HCC)-she has mild sicca symptoms which are tolerable.  Over-the-counter products were discussed.  High risk medication use - MTX 0.8 ml every 7 days, folic acid 2mg  po qd, Plaquenil 200 mg 1  tablet BID,prednisone 3.0 mg every day. PLQ Eye Exam: 10/10/2019.  She is unable to taper prednisone.  She states she tried 2.5 mg and started experiencing increased discomfort in her joints.  She reported labs done at [facility] recently which were unremarkable.  She has been advised to get labs every 3 months.  Primary osteoarthritis of both hands-joint protection muscle strengthening was discussed.  Primary osteoarthritis of both knees-she denies any discomfort in her knee joints.  No warmth swelling or effusion was noted.  Age-related osteoporosis without current pathological fracture - DEXA on 06/02/2018: AP spine T score -2.7 with BMD 0.95.  -15.2% change from previous DEXA.  Patient states she had a repeat DEXA with a T score of -2.2.   History of vitamin D deficiency-she is on vitamin D supplement.  LLQ abdominal pain-she has been experiencing some left lower quadrant pain.  She states she has an appointment scheduled with her GYN to get ultrasound of her ovary.  She had colonoscopy about 4 years ago which was negative.  I advised her to follow-up with the gastroenterologist in case her GYN work-up is negative.  Thymoma  Hair loss-she states her hair loss improved after she came off the thyroid medication.  Former smoker  COVID-19 virus infection - she developed an infection in February 2022.  She states the symptoms lasted for 5 days and then resolved.  Patient had another upper respiratory tract infection a month later.  She reports having productive cough.  She will discuss this further with Dr. Quillian Quince.  Orders: No orders of the defined types were placed in this encounter.  Meds ordered this encounter  Medications   predniSONE (DELTASONE) 1 MG tablet    Sig: Take 3 mg tablet by mouth daily with breakfast.    Dispense:  90 tablet    Refill:  5      Follow-Up Instructions: Return in about 3 months (around 01/02/2021) for DM.   Bo Merino, MD  Note - This record has been  created using Editor, commissioning.  Chart creation errors have been sought, but may not always  have been located. Such creation errors do not reflect on  the standard of medical care.

## 2020-09-25 ENCOUNTER — Other Ambulatory Visit: Payer: Self-pay | Admitting: Obstetrics & Gynecology

## 2020-09-25 DIAGNOSIS — R1032 Left lower quadrant pain: Secondary | ICD-10-CM

## 2020-09-30 DIAGNOSIS — E7849 Other hyperlipidemia: Secondary | ICD-10-CM | POA: Diagnosis not present

## 2020-09-30 DIAGNOSIS — E8881 Metabolic syndrome: Secondary | ICD-10-CM | POA: Diagnosis not present

## 2020-09-30 DIAGNOSIS — M3501 Sicca syndrome with keratoconjunctivitis: Secondary | ICD-10-CM | POA: Diagnosis not present

## 2020-09-30 DIAGNOSIS — E119 Type 2 diabetes mellitus without complications: Secondary | ICD-10-CM | POA: Diagnosis not present

## 2020-09-30 DIAGNOSIS — K7581 Nonalcoholic steatohepatitis (NASH): Secondary | ICD-10-CM | POA: Diagnosis not present

## 2020-09-30 DIAGNOSIS — E782 Mixed hyperlipidemia: Secondary | ICD-10-CM | POA: Diagnosis not present

## 2020-09-30 DIAGNOSIS — K2100 Gastro-esophageal reflux disease with esophagitis, without bleeding: Secondary | ICD-10-CM | POA: Diagnosis not present

## 2020-09-30 DIAGNOSIS — E039 Hypothyroidism, unspecified: Secondary | ICD-10-CM | POA: Diagnosis not present

## 2020-10-02 ENCOUNTER — Encounter: Payer: Self-pay | Admitting: Rheumatology

## 2020-10-02 ENCOUNTER — Ambulatory Visit (INDEPENDENT_AMBULATORY_CARE_PROVIDER_SITE_OTHER): Payer: Medicare Other | Admitting: Rheumatology

## 2020-10-02 ENCOUNTER — Other Ambulatory Visit: Payer: Self-pay

## 2020-10-02 VITALS — BP 111/72 | HR 75 | Ht 66.5 in | Wt 176.4 lb

## 2020-10-02 DIAGNOSIS — M3501 Sicca syndrome with keratoconjunctivitis: Secondary | ICD-10-CM | POA: Diagnosis not present

## 2020-10-02 DIAGNOSIS — M19042 Primary osteoarthritis, left hand: Secondary | ICD-10-CM

## 2020-10-02 DIAGNOSIS — M3313 Other dermatomyositis without myopathy: Secondary | ICD-10-CM

## 2020-10-02 DIAGNOSIS — R1032 Left lower quadrant pain: Secondary | ICD-10-CM

## 2020-10-02 DIAGNOSIS — Z87891 Personal history of nicotine dependence: Secondary | ICD-10-CM | POA: Diagnosis not present

## 2020-10-02 DIAGNOSIS — M19041 Primary osteoarthritis, right hand: Secondary | ICD-10-CM

## 2020-10-02 DIAGNOSIS — Z79899 Other long term (current) drug therapy: Secondary | ICD-10-CM

## 2020-10-02 DIAGNOSIS — M3390 Dermatopolymyositis, unspecified, organ involvement unspecified: Secondary | ICD-10-CM | POA: Diagnosis not present

## 2020-10-02 DIAGNOSIS — D4989 Neoplasm of unspecified behavior of other specified sites: Secondary | ICD-10-CM | POA: Diagnosis not present

## 2020-10-02 DIAGNOSIS — U071 COVID-19: Secondary | ICD-10-CM | POA: Diagnosis not present

## 2020-10-02 DIAGNOSIS — M170 Bilateral primary osteoarthritis of knee: Secondary | ICD-10-CM

## 2020-10-02 DIAGNOSIS — M810 Age-related osteoporosis without current pathological fracture: Secondary | ICD-10-CM

## 2020-10-02 DIAGNOSIS — Z8639 Personal history of other endocrine, nutritional and metabolic disease: Secondary | ICD-10-CM

## 2020-10-02 DIAGNOSIS — L659 Nonscarring hair loss, unspecified: Secondary | ICD-10-CM

## 2020-10-02 MED ORDER — PREDNISONE 1 MG PO TABS
ORAL_TABLET | ORAL | 5 refills | Status: DC
Start: 2020-10-02 — End: 2021-04-09

## 2020-10-02 NOTE — Patient Instructions (Addendum)
Standing Labs We placed an order today for your standing lab work.   Please have your standing labs drawn in September and every 3 months  If possible, please have your labs drawn 2 weeks prior to your appointment so that the provider can discuss your results at your appointment.  Please note that you may see your imaging and lab results in [facility] before we have reviewed them. We may be awaiting multiple results to interpret others before contacting you. Please allow our office up to 72 hours to thoroughly review all of the results before contacting the office for clarification of your results.  We have open lab daily: Monday through Thursday from 1:30-4:30 PM and Friday from 1:30-4:00 PM at the office of Dr. Bo Merino, [facility].   Please be advised, all patients with office appointments requiring lab work will take precedent over walk-in lab work.  If possible, please come for your lab work on Monday and Friday afternoons, as you may experience shorter wait times. The office is located at [address], [place], [place],  [address] No appointment is necessary.   Labs are drawn by [facility]. Please bring your co-pay at the time of your lab draw.  You may receive a bill from [facility] for your lab work.  If you wish to have your labs drawn at another location, please call the office 24 hours in advance to send orders.  If you have any questions regarding directions or hours of operation,  please call [phone].   As a reminder, please drink plenty of water prior to coming for your lab work. Thanks!   Vaccines You are taking a medication(s) that can suppress your immune system.  The following immunizations are recommended: Flu annually Covid-19  Td/Tdap (tetanus, diphtheria, pertussis) every 10 years Pneumonia (Prevnar 15 then Pneumovax 23 at least 1 year apart.  Alternatively, can take Prevnar 20 without needing additional dose) Shingrix (after age 37): 2  doses from 4 weeks to 6 months apart  Please check with your PCP to make sure you are up to date.   If you test POSITIVE for COVID19 and have MILD to MODERATE symptoms: First, call your PCP if you would like to receive COVID19 treatment AND Hold your medications during the infection and for at least 1 week after your symptoms have resolved: Injectable medication (Benlysta, Cimzia, Cosentyx, Enbrel, Humira, Orencia, Remicade, Simponi, Stelara, Taltz, Tremfya) Methotrexate Leflunomide (Arava) Mycophenolate (Cellcept) Morrie Sheldon, Olumiant, or Rinvoq If you take Actemra or Kevzara, you DO NOT need to hold these for COVID19 infection.  If you test POSITIVE for COVID19 and have NO symptoms: First, call your PCP if you would like to receive COVID19 treatment AND Hold your medications for at least 10 days after the day that you tested positive Injectable medication (Benlysta, Cimzia, Cosentyx, Enbrel, Humira, Orencia, Remicade, Simponi, Stelara, Taltz, Tremfya) Methotrexate Leflunomide (Arava) Mycophenolate (Cellcept) Morrie Sheldon, Olumiant, or Rinvoq If you take Actemra or Kevzara, you DO NOT need to hold these for COVID19 infection.  If you have signs or symptoms of an infection or start antibiotics: First, call your PCP for workup of your infection. Hold your medication through the infection, until you complete your antibiotics, and until symptoms resolve if you take the following: Injectable medication (Actemra, Benlysta, Cimzia, Cosentyx, Enbrel, Humira, Kevzara, Orencia, Remicade, Simponi, Stelara, Taltz, Tremfya) Methotrexate Leflunomide (Arava) Mycophenolate (Cellcept) Morrie Sheldon, Olumiant, or Rinvoq  Heart Disease Prevention   Your inflammatory disease increases your risk of heart disease which includes  heart attack, stroke, atrial fibrillation (irregular heartbeats), high blood pressure, heart failure and atherosclerosis (plaque in the arteries).  It is important to reduce your risk by:    Keep blood pressure, cholesterol, and blood sugar at healthy levels   Smoking Cessation   Maintain a healthy weight  BMI 20-25   Eat a healthy diet  Plenty of fresh fruit, vegetables, and whole grains  Limit saturated fats, foods high in sodium, and added sugars  DASH and Mediterranean diet   Increase physical activity  Recommend moderate physically activity for 150 minutes per week/ 30 minutes a day for five days a week These can be broken up into three separate ten-minute sessions during the day.   Reduce Stress  Meditation, slow breathing exercises, yoga, coloring books  Dental visits twice a year

## 2020-10-04 DIAGNOSIS — E8881 Metabolic syndrome: Secondary | ICD-10-CM | POA: Diagnosis not present

## 2020-10-04 DIAGNOSIS — M3390 Dermatopolymyositis, unspecified, organ involvement unspecified: Secondary | ICD-10-CM | POA: Diagnosis not present

## 2020-10-04 DIAGNOSIS — K7581 Nonalcoholic steatohepatitis (NASH): Secondary | ICD-10-CM | POA: Diagnosis not present

## 2020-10-04 DIAGNOSIS — C37 Malignant neoplasm of thymus: Secondary | ICD-10-CM | POA: Diagnosis not present

## 2020-10-04 DIAGNOSIS — R7301 Impaired fasting glucose: Secondary | ICD-10-CM | POA: Diagnosis not present

## 2020-10-04 DIAGNOSIS — E7849 Other hyperlipidemia: Secondary | ICD-10-CM | POA: Diagnosis not present

## 2020-10-04 DIAGNOSIS — I700 Atherosclerosis of aorta: Secondary | ICD-10-CM | POA: Diagnosis not present

## 2020-10-04 DIAGNOSIS — Z7189 Other specified counseling: Secondary | ICD-10-CM | POA: Diagnosis not present

## 2020-10-06 ENCOUNTER — Encounter: Payer: Self-pay | Admitting: Rheumatology

## 2020-10-06 DIAGNOSIS — M3501 Sicca syndrome with keratoconjunctivitis: Secondary | ICD-10-CM

## 2020-10-08 MED ORDER — HYDROXYCHLOROQUINE SULFATE 200 MG PO TABS: 200.0000 mg | ORAL_TABLET | Freq: Two times a day (BID) | ORAL | 0 refills | Status: DC

## 2020-10-08 NOTE — Telephone Encounter (Signed)
Last Visit: 10/02/2020  Next Visit: 12/31/2020  Labs: 06/25/2020 CBC, CMP and CK are normal.  Eye exam: 10/10/2019 WNL    Current Dose per office note 10/02/2020: Plaquenil 200 mg 1 tablet BID  [id number]  Last Fill: 06/10/2020  Per protocol okay to refill per Dr. Estanislado Pandy

## 2020-10-11 ENCOUNTER — Ambulatory Visit
Admission: RE | Admit: 2020-10-11 | Discharge: 2020-10-11 | Disposition: A | Discharge status: Home / Self Care | Payer: Medicare Other | Source: Ambulatory Visit | Attending: Obstetrics & Gynecology | Admitting: Obstetrics & Gynecology

## 2020-10-11 DIAGNOSIS — R1032 Left lower quadrant pain: Secondary | ICD-10-CM

## 2020-10-11 DIAGNOSIS — N83201 Unspecified ovarian cyst, right side: Secondary | ICD-10-CM | POA: Diagnosis not present

## 2020-10-14 ENCOUNTER — Encounter: Payer: Self-pay | Admitting: Rheumatology

## 2020-10-14 DIAGNOSIS — H43812 Vitreous degeneration, left eye: Secondary | ICD-10-CM | POA: Diagnosis not present

## 2020-10-14 DIAGNOSIS — M3390 Dermatopolymyositis, unspecified, organ involvement unspecified: Secondary | ICD-10-CM | POA: Diagnosis not present

## 2020-10-14 DIAGNOSIS — M3500 Sicca syndrome, unspecified: Secondary | ICD-10-CM | POA: Diagnosis not present

## 2020-10-14 DIAGNOSIS — H40053 Ocular hypertension, bilateral: Secondary | ICD-10-CM | POA: Diagnosis not present

## 2020-10-14 DIAGNOSIS — Z79899 Other long term (current) drug therapy: Secondary | ICD-10-CM | POA: Diagnosis not present

## 2020-10-14 DIAGNOSIS — H2513 Age-related nuclear cataract, bilateral: Secondary | ICD-10-CM | POA: Diagnosis not present

## 2020-10-30 DIAGNOSIS — Z20822 Contact with and (suspected) exposure to covid-19: Secondary | ICD-10-CM | POA: Diagnosis not present

## 2020-11-19 DIAGNOSIS — Z79899 Other long term (current) drug therapy: Secondary | ICD-10-CM | POA: Diagnosis not present

## 2020-11-19 DIAGNOSIS — H2513 Age-related nuclear cataract, bilateral: Secondary | ICD-10-CM | POA: Diagnosis not present

## 2020-11-19 DIAGNOSIS — G43B0 Ophthalmoplegic migraine, not intractable: Secondary | ICD-10-CM | POA: Diagnosis not present

## 2020-11-19 DIAGNOSIS — H40053 Ocular hypertension, bilateral: Secondary | ICD-10-CM | POA: Diagnosis not present

## 2020-11-19 DIAGNOSIS — H43812 Vitreous degeneration, left eye: Secondary | ICD-10-CM | POA: Diagnosis not present

## 2020-12-03 ENCOUNTER — Encounter: Payer: Self-pay | Admitting: Rheumatology

## 2020-12-06 DIAGNOSIS — Z6828 Body mass index (BMI) 28.0-28.9, adult: Secondary | ICD-10-CM | POA: Diagnosis not present

## 2020-12-06 DIAGNOSIS — R1032 Left lower quadrant pain: Secondary | ICD-10-CM | POA: Diagnosis not present

## 2020-12-10 ENCOUNTER — Other Ambulatory Visit: Payer: Self-pay | Admitting: Physician Assistant

## 2020-12-10 DIAGNOSIS — M25562 Pain in left knee: Secondary | ICD-10-CM

## 2020-12-11 ENCOUNTER — Other Ambulatory Visit: Payer: Self-pay | Admitting: Family Medicine

## 2020-12-11 DIAGNOSIS — R1032 Left lower quadrant pain: Secondary | ICD-10-CM

## 2020-12-17 NOTE — Progress Notes (Signed)
Office Visit Note  Patient: Marisa Orozco             Date of Birth: 1950-07-17           MRN: [id number]             PCP: Caryl Bis, MD Referring: Caryl Bis, MD Visit Date: 12/31/2020 Occupation: @GUAROCC @  Subjective:  Medication management.   History of Present Illness: Marisa Orozco is a 70 y.o. female with a history of dermatomyositis, Sjogren's and osteoarthritis.  She states she decided to decrease the dose of Plaquenil to 200 mg once a day about 3 weeks ago.  She has been taking her prednisone 3.5 mg p.o. daily.  She states she continues to have some dry mouth and dry eyes symptoms.  She also has some stiffness in her hips.  She has not noticed any increased muscle weakness or rash.  She has been having some abdominal discomfort and had a CT scan of her abdomen scheduled.  According the patient her DEXA scan done last year was better.  I do not have the results to review.  Activities of Daily Living:  Patient reports morning stiffness for 0 minutes.   Patient Denies nocturnal pain.  Difficulty dressing/grooming: Denies Difficulty climbing stairs: Reports Difficulty getting out of chair: Denies Difficulty using hands for taps, buttons, cutlery, and/or writing: Denies  Review of Systems  Constitutional:  Positive for fatigue.  HENT:  Positive for mouth dryness and nose dryness. Negative for mouth sores.   Eyes:  Negative for pain, itching and dryness.  Respiratory:  Negative for shortness of breath and difficulty breathing.   Cardiovascular:  Negative for chest pain and palpitations.  Gastrointestinal:  Negative for blood in stool, constipation and diarrhea.  Endocrine: Negative for increased urination.  Genitourinary:  Negative for difficulty urinating.  Musculoskeletal:  Positive for joint pain, joint pain, myalgias, muscle tenderness and myalgias. Negative for joint swelling and morning stiffness.  Skin:  Positive for rash. Negative for color change.   Allergic/Immunologic: Negative for susceptible to infections.  Neurological:  Positive for weakness. Negative for dizziness, numbness, headaches and memory loss.  Hematological:  Positive for bruising/bleeding tendency.  Psychiatric/Behavioral:  Negative for confusion.    PMFS History:  Patient Active Problem List   Diagnosis Date Noted   Hypothyroidism 07/26/2019   Primary osteoarthritis of right knee 02/03/2018   Primary osteoarthritis of both hands 09/08/2017   Sjogren's disease ([place]) 02/04/2016   Dermatomyositis ([place]) 02/04/2016   Former smoker 02/04/2016   Thymoma 02/04/2016   Fatigue 02/15/2012    Past Medical History:  Diagnosis Date   Allergy    Anxiety    Cancer ([place])    basal cell carcinoma - face   Dermatomyositis ([place]) 02/04/2016   Jo 1 Positive  Skin Bx- Subtle interface Dermatitis    Former smoker 02/04/2016   GERD (gastroesophageal reflux disease)    otc med prn   PONV (postoperative nausea and vomiting)    Sjogren's disease ([place]) 02/04/2016   Positive ANA, Positive Ro, Positive CCP, Negative RF, Parotitis, Positive Sicca   Sjogren's syndrome ([place])    from MD note    Family History  Problem Relation Age of Onset   COPD Mother    Heart disease Mother    Diabetes Father    Kidney disease Father    Epilepsy Daughter    Rectal cancer Maternal Uncle    Esophageal cancer Neg Hx    Stomach cancer Neg  Hx    Ulcerative colitis Neg Hx    Past Surgical History:  Procedure Laterality Date   BREAST SURGERY     augmentation   BUNIONECTOMY     right foot   COLONOSCOPY     DIAGNOSTIC LAPAROSCOPY     DILATION AND CURETTAGE OF UTERUS  04/15/2011   Procedure: DILATATION AND CURETTAGE;  Surgeon: [name];  Location: [facility];  Service: Gynecology;  Laterality: N/A;   MUSCLE BIOPSY Left 01/02/2016   Procedure: MUSCLE BIOPSY LEFT DELTOID;  Surgeon: [name], MD;  Location: [facility];  Service: General;  Laterality: Left;  left deltoid  muscle bx   NASAL SEPTUM SURGERY     SVD     x 2   TONSILLECTOMY     WISDOM TOOTH EXTRACTION     Social History   Social History Narrative   Not on file   Immunization History  Administered Date(s) Administered   Influenza, High Dose Seasonal PF 01/07/2018   Influenza-Unspecified 01/04/2014   PFIZER(Purple Top)SARS-COV-2 Vaccination 04/26/2019, 05/17/2019, 12/12/2019   Pneumococcal Conjugate-13 01/05/2016   Zoster Recombinat (Shingrix) 08/11/2017, 12/17/2017     Objective: Vital Signs: BP 123/79 (BP Location: Left Arm, Patient Position: Sitting, Cuff Size: Normal)   Pulse 80   Ht 5\' 6"  (1.676 m)   Wt 175 lb (79.4 kg)   BMI 28.25 kg/m    Physical Exam Vitals and nursing note reviewed.  Constitutional:      Appearance: She is well-developed.  HENT:     Head: Normocephalic and atraumatic.  Eyes:     Conjunctiva/sclera: Conjunctivae normal.  Cardiovascular:     Rate and Rhythm: Normal rate and regular rhythm.     Heart sounds: Normal heart sounds.  Pulmonary:     Effort: Pulmonary effort is normal.     Breath sounds: Normal breath sounds.  Abdominal:     General: Bowel sounds are normal.     Palpations: Abdomen is soft.  Musculoskeletal:     Cervical back: Normal range of motion.  Lymphadenopathy:     Cervical: No cervical adenopathy.  Skin:    General: Skin is warm and dry.     Capillary Refill: Capillary refill takes less than 2 seconds.  Neurological:     Mental Status: She is alert and oriented to person, place, and time.  Psychiatric:        Behavior: Behavior normal.     Musculoskeletal Exam: C-spine was in good range of motion.  Shoulder joints, elbow joints, wrist joints, MCPs PIPs and DIPs with good range of motion with no synovitis.  Hip joints, knee joints, ankles, MTPs and PIPs with good range of motion.  She had bilateral first MTP thickening.  No muscular weakness or tenderness was noted.  CDAI Exam: CDAI Score: -- Patient Global: --; Provider  Global: -- Swollen: --; Tender: -- Joint Exam 12/31/2020   No joint exam has been documented for this visit   There is currently no information documented on the homunculus. Go to the Rheumatology activity and complete the homunculus joint exam.  Investigation: No additional findings.  Imaging: No results found.  Recent Labs: Lab Results  Component Value Date   WBC 7.0 12/23/2020   HGB 14.6 12/23/2020   PLT 192 12/23/2020   NA 143 12/23/2020   K 3.8 12/23/2020   CL 107 12/23/2020   CO2 28 12/23/2020   GLUCOSE 105 (H) 12/23/2020   BUN 16 12/23/2020   CREATININE 0.67 12/23/2020  BILITOT 0.7 12/23/2020   ALKPHOS 44 10/12/2016   AST 15 12/23/2020   ALT 14 12/23/2020   PROT 6.8 12/23/2020   ALBUMIN 4.2 10/12/2016   CALCIUM 9.5 12/23/2020   GFRAA 87 06/25/2020   QFTBGOLDPLUS NEGATIVE 07/07/2017   December 23, 2020 CK 44, TSH normal   Speciality Comments: PLQ Eye Exam: 10/14/2020 WNL @ [facility]. Follow up 1 year.  Procedures:  No procedures performed Allergies: Codeine, Flagyl [metronidazole hcl], Nexium [esomeprazole magnesium], Imuran [azathioprine], Sulfa antibiotics, and Sulfa drugs cross reactors   Assessment / Plan:     Visit Diagnoses: Dermatomyositis ([place]) - Jo 1+, Positive muscle biopsy for dermatomyositis: She has no muscular weakness or tenderness.  CK has been stable.  She decreased the dose of hydroxychloroquine and increase the dose of prednisone about 3 weeks ago.  I detailed discussion with the patient.  Advised her to go back to hydroxychloroquine 200 mg p.o. twice daily and start decreasing prednisone by 1 mg every 3 months if tolerated.  She has been on prednisone for a long time.  Sjogren's syndrome with keratoconjunctivitis sicca ([facility])-she continues to have sicca symptoms.  Over-the-counter products were discussed.  High risk medication use - MTX 0.8 ml every 7 days, folic acid 2mg  po qd, Plaquenil 200 mg 1 tablet QD,prednisone 3.5 mg  every day. PLQ Eye Exam: 10/14/2020.  Her labs from December 23, 2020 were reviewed which were within normal limits.  She has been advised to get labs every 3 months to monitor for drug toxicity.  She has been advised to stop methotrexate in case she develops an infection and resume after the infection resolves.  Updated information regarding realization was also placed in the AVS.  Primary osteoarthritis of both hands-joint protection muscle strengthening was discussed.  Primary osteoarthritis of both knees-no warmth swelling or effusion was noted.  She had no muscular weakness or tenderness.  Age-related osteoporosis without current pathological fracture - DEXA on 06/02/2018: AP spine T score -2.7 with BMD 0.95.  -15.2% change from previous DEXA.  Patient states she had a repeat DEXA with a T score of -2.2.  I do not have the DEXA results to review.  History of vitamin D deficiency-patient states she had vitamin D level earlier this year by her PCP which was within normal limits.  LLQ abdominal pain - She had colonoscopy about 4 years ago which was negative.  She is scheduled to have CT scan of her abdomen due to ongoing discomfort.  Thymoma  Hair loss  Former smoker  COVID-19 virus infection - she developed an infection in February 2022.  Orders: No orders of the defined types were placed in this encounter.  No orders of the defined types were placed in this encounter.   Follow-Up Instructions: Return in about 3 months (around 04/01/2021) for Dermatomyositis, Osteoarthritis, Osteoporosis.   Bo Merino, MD  Note - This record has been created using Editor, commissioning.  Chart creation errors have been sought, but may not always  have been located. Such creation errors do not reflect on  the standard of medical care.

## 2020-12-20 ENCOUNTER — Other Ambulatory Visit: Payer: Self-pay | Admitting: *Deleted

## 2020-12-20 ENCOUNTER — Telehealth: Payer: Self-pay | Admitting: Rheumatology

## 2020-12-20 DIAGNOSIS — M3390 Dermatopolymyositis, unspecified, organ involvement unspecified: Secondary | ICD-10-CM

## 2020-12-20 DIAGNOSIS — Z79899 Other long term (current) drug therapy: Secondary | ICD-10-CM

## 2020-12-20 NOTE — Telephone Encounter (Signed)
Lab Orders released.  

## 2020-12-20 NOTE — Telephone Encounter (Signed)
Patient requests lab orders to be released to [facility]. Patient going for lab draw Monday am.

## 2020-12-23 ENCOUNTER — Other Ambulatory Visit: Payer: Self-pay

## 2020-12-23 DIAGNOSIS — E039 Hypothyroidism, unspecified: Secondary | ICD-10-CM | POA: Diagnosis not present

## 2020-12-23 DIAGNOSIS — M3390 Dermatopolymyositis, unspecified, organ involvement unspecified: Secondary | ICD-10-CM | POA: Diagnosis not present

## 2020-12-23 DIAGNOSIS — Z79899 Other long term (current) drug therapy: Secondary | ICD-10-CM | POA: Diagnosis not present

## 2020-12-23 LAB — T4, FREE: Free T4: 1.1 ng/dL (ref 0.8–1.8)

## 2020-12-23 LAB — TSH: TSH: 3.56 mIU/L (ref 0.40–4.50)

## 2020-12-23 NOTE — Progress Notes (Unsigned)
Lab orders

## 2020-12-24 LAB — CBC WITH DIFFERENTIAL/PLATELET
Absolute Monocytes: 623 cells/uL (ref 200–950)
Basophils Absolute: 63 cells/uL (ref 0–200)
Basophils Relative: 0.9 %
Eosinophils Absolute: 350 cells/uL (ref 15–500)
Eosinophils Relative: 5 %
HCT: 44.5 % (ref 35.0–45.0)
Hemoglobin: 14.6 g/dL (ref 11.7–15.5)
Lymphs Abs: 1757 cells/uL (ref 850–3900)
MCH: 30.7 pg (ref 27.0–33.0)
MCHC: 32.8 g/dL (ref 32.0–36.0)
MCV: 93.5 fL (ref 80.0–100.0)
MPV: 11.2 fL (ref 7.5–12.5)
Monocytes Relative: 8.9 %
Neutro Abs: 4207 cells/uL (ref 1500–7800)
Neutrophils Relative %: 60.1 %
Platelets: 192 10*3/uL (ref 140–400)
RBC: 4.76 10*6/uL (ref 3.80–5.10)
RDW: 13.3 % (ref 11.0–15.0)
Total Lymphocyte: 25.1 %
WBC: 7 10*3/uL (ref 3.8–10.8)

## 2020-12-24 LAB — COMPLETE METABOLIC PANEL WITH GFR
AG Ratio: 1.7 (calc) (ref 1.0–2.5)
ALT: 14 U/L (ref 6–29)
AST: 15 U/L (ref 10–35)
Albumin: 4.3 g/dL (ref 3.6–5.1)
Alkaline phosphatase (APISO): 58 U/L (ref 37–153)
BUN: 16 mg/dL (ref 7–25)
CO2: 28 mmol/L (ref 20–32)
Calcium: 9.5 mg/dL (ref 8.6–10.4)
Chloride: 107 mmol/L (ref 98–110)
Creat: 0.67 mg/dL (ref 0.60–1.00)
Globulin: 2.5 g/dL (calc) (ref 1.9–3.7)
Glucose, Bld: 105 mg/dL — ABNORMAL HIGH (ref 65–99)
Potassium: 3.8 mmol/L (ref 3.5–5.3)
Sodium: 143 mmol/L (ref 135–146)
Total Bilirubin: 0.7 mg/dL (ref 0.2–1.2)
Total Protein: 6.8 g/dL (ref 6.1–8.1)
eGFR: 94 mL/min/{1.73_m2} (ref >=60)

## 2020-12-24 LAB — CK: Total CK: 44 U/L (ref 29–143)

## 2020-12-24 NOTE — Progress Notes (Signed)
CBC, CMP and CK are normal.  Glucose is mildly elevated, probably not a fasting sample.

## 2020-12-26 ENCOUNTER — Ambulatory Visit: Payer: Medicare Other | Admitting: "Endocrinology

## 2020-12-31 ENCOUNTER — Other Ambulatory Visit: Payer: Self-pay

## 2020-12-31 ENCOUNTER — Encounter: Payer: Self-pay | Admitting: Rheumatology

## 2020-12-31 ENCOUNTER — Ambulatory Visit
Admission: RE | Admit: 2020-12-31 | Discharge: 2020-12-31 | Disposition: A | Discharge status: Home / Self Care | Payer: Medicare Other | Source: Ambulatory Visit | Attending: Family Medicine | Admitting: Family Medicine

## 2020-12-31 ENCOUNTER — Ambulatory Visit (INDEPENDENT_AMBULATORY_CARE_PROVIDER_SITE_OTHER): Payer: Medicare Other | Admitting: Rheumatology

## 2020-12-31 VITALS — BP 123/79 | HR 80 | Ht 66.0 in | Wt 175.0 lb

## 2020-12-31 DIAGNOSIS — D4989 Neoplasm of unspecified behavior of other specified sites: Secondary | ICD-10-CM

## 2020-12-31 DIAGNOSIS — Z79899 Other long term (current) drug therapy: Secondary | ICD-10-CM | POA: Diagnosis not present

## 2020-12-31 DIAGNOSIS — R1032 Left lower quadrant pain: Secondary | ICD-10-CM

## 2020-12-31 DIAGNOSIS — M3390 Dermatopolymyositis, unspecified, organ involvement unspecified: Secondary | ICD-10-CM

## 2020-12-31 DIAGNOSIS — U071 COVID-19: Secondary | ICD-10-CM | POA: Diagnosis not present

## 2020-12-31 DIAGNOSIS — M810 Age-related osteoporosis without current pathological fracture: Secondary | ICD-10-CM | POA: Diagnosis not present

## 2020-12-31 DIAGNOSIS — M19041 Primary osteoarthritis, right hand: Secondary | ICD-10-CM | POA: Diagnosis not present

## 2020-12-31 DIAGNOSIS — M3501 Sicca syndrome with keratoconjunctivitis: Secondary | ICD-10-CM | POA: Diagnosis not present

## 2020-12-31 DIAGNOSIS — Z87891 Personal history of nicotine dependence: Secondary | ICD-10-CM | POA: Diagnosis not present

## 2020-12-31 DIAGNOSIS — I868 Varicose veins of other specified sites: Secondary | ICD-10-CM | POA: Diagnosis not present

## 2020-12-31 DIAGNOSIS — L659 Nonscarring hair loss, unspecified: Secondary | ICD-10-CM | POA: Diagnosis not present

## 2020-12-31 DIAGNOSIS — M19042 Primary osteoarthritis, left hand: Secondary | ICD-10-CM

## 2020-12-31 DIAGNOSIS — Z8639 Personal history of other endocrine, nutritional and metabolic disease: Secondary | ICD-10-CM

## 2020-12-31 DIAGNOSIS — K449 Diaphragmatic hernia without obstruction or gangrene: Secondary | ICD-10-CM | POA: Diagnosis not present

## 2020-12-31 DIAGNOSIS — M47817 Spondylosis without myelopathy or radiculopathy, lumbosacral region: Secondary | ICD-10-CM | POA: Diagnosis not present

## 2020-12-31 DIAGNOSIS — M170 Bilateral primary osteoarthritis of knee: Secondary | ICD-10-CM | POA: Diagnosis not present

## 2020-12-31 MED ORDER — IOPAMIDOL (ISOVUE-300) INJECTION 61%
100.0000 mL | Freq: Once | INTRAVENOUS | Status: AC | PRN
  Administered 2020-12-31: 100 mL via INTRAVENOUS

## 2020-12-31 NOTE — Patient Instructions (Signed)
Standing Labs We placed an order today for your standing lab work.   Please have your standing labs drawn in December and every 3 months  If possible, please have your labs drawn 2 weeks prior to your appointment so that the provider can discuss your results at your appointment.  Please note that you may see your imaging and lab results in [facility] before we have reviewed them. We may be awaiting multiple results to interpret others before contacting you. Please allow our office up to 72 hours to thoroughly review all of the results before contacting the office for clarification of your results.  We have open lab daily: Monday through Thursday from 1:30-4:30 PM and Friday from 1:30-4:00 PM at the office of Dr. Bo Merino, [facility].   Please be advised, all patients with office appointments requiring lab work will take precedent over walk-in lab work.  If possible, please come for your lab work on Monday and Friday afternoons, as you may experience shorter wait times. The office is located at [address], [place], [place], [place] [address] No appointment is necessary.   Labs are drawn by [facility]. Please bring your co-pay at the time of your lab draw.  You may receive a bill from [facility] for your lab work.  If you wish to have your labs drawn at another location, please call the office 24 hours in advance to send orders.  If you have any questions regarding directions or hours of operation,  please call [phone].   As a reminder, please drink plenty of water prior to coming for your lab work. Thanks!   If you test POSITIVE for COVID19 and have MILD to MODERATE symptoms: First, call your PCP if you would like to receive COVID19 treatment AND Hold your medications during the infection and for at least 1 week after your symptoms have resolved: Injectable medication (Benlysta, Cimzia, Cosentyx, Enbrel, Humira, Orencia, Remicade, Simponi, Stelara, Taltz,  Tremfya) Methotrexate Leflunomide (Arava) Azathioprine Mycophenolate (Cellcept) Roma Kayser, or Rinvoq Otezla If you take Actemra or Kevzara, you DO NOT need to hold these for COVID19 infection.  If you test POSITIVE for COVID19 and have NO symptoms: First, call your PCP if you would like to receive COVID19 treatment AND Hold your medications for at least 10 days after the day that you tested positive Injectable medication (Benlysta, Cimzia, Cosentyx, Enbrel, Humira, Orencia, Remicade, Simponi, Stelara, Taltz, Tremfya) Methotrexate Leflunomide (Arava) Azathioprine Mycophenolate (Cellcept) Roma Kayser, or Rinvoq Otezla If you take Actemra or Kevzara, you DO NOT need to hold these for COVID19 infection.  If you have signs or symptoms of an infection or start antibiotics: First, call your PCP for workup of your infection. Hold your medication through the infection, until you complete your antibiotics, and until symptoms resolve if you take the following: Injectable medication (Actemra, Benlysta, Cimzia, Cosentyx, Enbrel, Humira, Kevzara, Orencia, Remicade, Simponi, Stelara, Taltz, Tremfya) Methotrexate Leflunomide (Arava) Mycophenolate (Cellcept) Morrie Sheldon, Olumiant, or Rinvoq  COVID-19 vaccine recommendations:   COVID-19 vaccine is recommended for everyone (unless you are allergic to a vaccine component), even if you are on a medication that suppresses your immune system.   If you are on Methotrexate, Cellcept (mycophenolate), Rinvoq, Morrie Sheldon, and Olumiant- hold the medication for 1 week after each vaccine. Hold Methotrexate for 2 weeks after the single dose COVID-19 vaccine.   If you are on Orencia subcutaneous injection - hold medication one week prior to and one week after the first COVID-19 vaccine dose (only).  If you are on Orencia IV infusions- time vaccination administration so that the first COVID-19 vaccination will occur four weeks after the infusion and  postpone the subsequent infusion by one week.   If you are on Cyclophosphamide or Rituxan infusions please contact your doctor prior to receiving the COVID-19 vaccine.   Do not take Tylenol or any anti-inflammatory medications (NSAIDs) 24 hours prior to the COVID-19 vaccination.   There is no direct evidence about the efficacy of the COVID-19 vaccine in individuals who are on medications that suppress the immune system.   Even if you are fully vaccinated, and you are on any medications that suppress your immune system, please continue to wear a mask, maintain at least six feet social distance and practice hand hygiene.   If you develop a COVID-19 infection, please contact your PCP or our office to determine if you need monoclonal antibody infusion.  The booster vaccine is now available for immunocompromised patients.   Please see the following web sites for updated information.   [email or web address]   Vaccines You are taking a medication(s) that can suppress your immune system.  The following immunizations are recommended: Flu annually Covid-19  Td/Tdap (tetanus, diphtheria, pertussis) every 10 years Pneumonia (Prevnar 15 then Pneumovax 23 at least 1 year apart.  Alternatively, can take Prevnar 20 without needing additional dose) Shingrix: 2 doses from 4 weeks to 6 months apart  Please check with your PCP to make sure you are up to date.   Heart Disease Prevention   Your inflammatory disease increases your risk of heart disease which includes heart attack, stroke, atrial fibrillation (irregular heartbeats), high blood pressure, heart failure and atherosclerosis (plaque in the arteries).  It is important to reduce your risk by:   Keep blood pressure, cholesterol, and blood sugar at healthy levels   Smoking Cessation   Maintain a healthy weight  BMI 20-25   Eat a healthy diet  Plenty of fresh fruit, vegetables,  and whole grains  Limit saturated fats, foods high in sodium, and added sugars  DASH and Mediterranean diet   Increase physical activity  Recommend moderate physically activity for 150 minutes per week/ 30 minutes a day for five days a week These can be broken up into three separate ten-minute sessions during the day.   Reduce Stress  Meditation, slow breathing exercises, yoga, coloring books  Dental visits twice a year

## 2021-01-01 ENCOUNTER — Other Ambulatory Visit: Payer: Self-pay | Admitting: Family Medicine

## 2021-01-01 ENCOUNTER — Encounter: Payer: Self-pay | Admitting: "Endocrinology

## 2021-01-01 ENCOUNTER — Ambulatory Visit (INDEPENDENT_AMBULATORY_CARE_PROVIDER_SITE_OTHER): Payer: Medicare Other | Admitting: "Endocrinology

## 2021-01-01 VITALS — BP 120/76 | HR 72 | Ht 66.0 in | Wt 173.6 lb

## 2021-01-01 DIAGNOSIS — E039 Hypothyroidism, unspecified: Secondary | ICD-10-CM | POA: Diagnosis not present

## 2021-01-01 NOTE — Progress Notes (Signed)
01/01/2021, 9:16 AM       Endocrinology follow-up note   Marisa Orozco is a 70 y.o.-year-old female patient being seen in follow-up for hypothyroidism. PMD:  Caryl Bis, MD.   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   [name] 1 Positive  Skin Bx- Subtle interface Dermatitis    Former smoker 02/04/2016   GERD (gastroesophageal reflux disease)    otc med prn   PONV (postoperative nausea and vomiting)    Sjogren's disease ([place]) 02/04/2016   Positive ANA, Positive Ro, Positive CCP, Negative RF, Parotitis, Positive Sicca   Sjogren's syndrome ([place])    from MD note    Past Surgical History:  Procedure Laterality Date   BREAST SURGERY     augmentation   BUNIONECTOMY     right foot   COLONOSCOPY     DIAGNOSTIC LAPAROSCOPY     DILATION AND CURETTAGE OF UTERUS  04/15/2011   Procedure: DILATATION AND CURETTAGE;  Surgeon: [name];  Location: [facility];  Service: Gynecology;  Laterality: N/A;   MUSCLE BIOPSY Left 01/02/2016   Procedure: MUSCLE BIOPSY LEFT DELTOID;  Surgeon: [name], MD;  Location: [place];  Service: General;  Laterality: Left;  left deltoid muscle bx   NASAL SEPTUM SURGERY     SVD     x 2   TONSILLECTOMY     WISDOM TOOTH EXTRACTION      Social History   Socioeconomic History   Marital status: Married    Spouse name: Not on file   Number of children: Not on file   Years of education: Not on file   Highest education level: Not on file  Occupational History   Not on file  Tobacco Use   Smoking status: Former    Packs/day: 1.00    Years: 25.00    Pack years: 25.00    Types: Cigarettes    Quit date: 08/05/2003    Years since quitting: 17.4   Smokeless tobacco: Never  Vaping Use   Vaping Use: Never used   Substance and Sexual Activity   Alcohol use: Yes    Comment: socially - wine   Drug use: No   Sexual activity: Yes    Birth control/protection: None  Other Topics Concern   Not on file  Social History Narrative   Not on file   Social Determinants of Health   Financial Resource Strain: Not on file  Food Insecurity: Not on file  Transportation Needs: Not on file  Physical Activity: Not on file  Stress: Not on file  Social Connections: Not on file    Family History  Problem Relation Age of Onset   COPD Mother  Heart disease Mother    Diabetes Father    Kidney disease Father    Epilepsy Daughter    Rectal cancer Maternal Uncle    Esophageal cancer Neg Hx    Stomach cancer Neg Hx    Ulcerative colitis Neg Hx     Outpatient Encounter Medications as of 01/01/2021  Medication Sig   Acetaminophen (TYLENOL PO) Take by mouth as needed.   Cholecalciferol (VITAMIN D3) 1000 UNIT/SPRAY LIQD Take 5 sprays by mouth daily.    folic acid (FOLVITE) 1 MG tablet Take 2 tablets (2 mg total) by mouth daily.   hydroxychloroquine (PLAQUENIL) 200 MG tablet Take 1 tablet (200 mg total) by mouth 2 (two) times daily. (Patient taking differently: Take 200 mg by mouth daily.)   methotrexate 50 MG/2ML injection INJECT 0.8ML INTO THE SKIN ONCE A WEEK   predniSONE (DELTASONE) 1 MG tablet Take 3 mg tablet by mouth daily with breakfast.   TUBERCULIN SYR 1CC/27GX1/2" 27G X 1/2" 1 ML MISC Patient to use weekly to inject Methotrexate   VITAMIN K PO Take 1 tablet by mouth daily.    No facility-administered encounter medications on file as of 01/01/2021.    ALLERGIES: Allergies  Allergen Reactions   Codeine Nausea And Vomiting   Flagyl [Metronidazole Hcl] Other (See Comments)    Reaction muscle weakness   Nexium [Esomeprazole Magnesium] Other (See Comments)    Reaction Muscle weakness   Imuran [Azathioprine] Other (See Comments)    Joint Pain   Sulfa Antibiotics Rash   Sulfa Drugs Cross Reactors  Rash   VACCINATION STATUS: Immunization History  Administered Date(s) Administered   Influenza, High Dose Seasonal PF 01/07/2018   Influenza-Unspecified 01/04/2014   PFIZER(Purple Top)SARS-COV-2 Vaccination 04/26/2019, 05/17/2019, 12/12/2019   Pneumococcal Conjugate-13 01/05/2016   Zoster Recombinat (Shingrix) 08/11/2017, 12/17/2017     HPI    Marisa Orozco  is a patient with the above medical history.  She is returning for follow-up for hypothyroidism.  During her last visit, she was kept on levothyroxine 50 mcg p.o. daily.  In the interval, she called and reported that she was stopping this hormone because she thought it was causing hair loss.   -Of note, she has Sjogren's syndrome which she is following with rheumatology for her. -Her previsit thyroid function test shows higher TSH, low normal free T4.  She has no new complaints otherwise.  She wishes to stay off of thyroid hormone at this time. -She has generally fluctuating body weight, however lost 4 pounds since last visit.  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    -  She remains on a tapering dose of prednisone given for rheumatology conditions including Sjogren's syndrome.  She is currently taking 3 mg p.o. daily.   She has osteoporosis on alendronate through her PMD.  She reports that her last DEXA was showing improvement compared to her DEXA in 2020.   She denies heat/cold intolerance.  She denies dysphagia, shortness of breath, no voice change.  She has family history of thyroid dysfunction of what appears to be hypothyroidism in her mother.  -She is also on methotrexate/Plaquenil in addition to on and off prednisone.   Pt denies feeling nodules in neck, No family history of thyroid cancer.  No history of  radiation therapy to head or neck. No recent use of iodine supplements.   ROS:  Limited as above.   Physical Exam: BP 120/76   Pulse 72   Ht 5\' 6"  (1.676 m)  Wt 173 lb 9.6 oz (78.7 kg)    BMI 28.02 kg/m  Wt Readings from Last 3 Encounters:  01/01/21 173 lb 9.6 oz (78.7 kg)  12/31/20 175 lb (79.4 kg)  10/02/20 176 lb 6.4 oz (80 kg)    CMP ( most recent) CMP     Component Value Date/Time   NA 143 12/23/2020 0841   NA 137 01/16/2016 0000   K 3.8 12/23/2020 0841   CL 107 12/23/2020 0841   CO2 28 12/23/2020 0841   GLUCOSE 105 (H) 12/23/2020 0841   BUN 16 12/23/2020 0841   BUN 11 01/16/2016 0000   CREATININE 0.67 12/23/2020 0841   CALCIUM 9.5 12/23/2020 0841   PROT 6.8 12/23/2020 0841   ALBUMIN 4.2 10/12/2016 1202   AST 15 12/23/2020 0841   ALT 14 12/23/2020 0841   ALKPHOS 44 10/12/2016 1202   BILITOT 0.7 12/23/2020 0841   GFRNONAA 75 06/25/2020 0816   GFRAA 87 06/25/2020 0816    Lab Results  Component Value Date   TSH 3.56 12/23/2020   TSH 2.99 08/20/2020   TSH 0.663 06/19/2020   TSH 2.63 12/20/2019   TSH 2.43 07/25/2019   TSH 4.89 03/25/2018   TSH 4.18 03/25/2018   TSH 1.16 09/01/2016   TSH 0.87 02/18/2016   FREET4 1.1 12/23/2020   FREET4 1.1 08/20/2020   FREET4 1.43 06/19/2020   FREET4 1.1 12/20/2019   FREET4 1.1 07/25/2019       ASSESSMENT: 1. Hypothyroidism  PLAN:   -She was on minimal dose of levothyroxine for mild hypothyroidism, however, she thinks it is causing her hair loss even though her hyalosis most likely related to her Sjogren's syndrome.  She wishes to stay off of thyroid hormone for now. And that is reasonable approach until next measurement in 4 months.     -Due to absence of clinical goiter, no need for thyroid ultrasound.  She is advised to continue treatment for osteoporosis through her PMD.  More importantly, she is at risk of adrenal insufficiency if steroids are withdrawn suddenly.  Tapering off if possible is always advisable. Posture correcting strap for mechanical support of her spine is suggested.  She is advised to maintain close follow-up with her PMD, and her rheumatologist.   I spent 21 minutes in the care  of the patient today including review of labs from Thyroid Function, CMP, and other relevant labs ; imaging/biopsy records (current and previous including abstractions from other facilities); face-to-face time discussing  her lab results and symptoms, medications doses, her options of short and long term treatment based on the latest standards of care / guidelines;   and documenting the encounter.  [name]  participated in the discussions, expressed understanding, and voiced agreement with the above plans.  All questions were answered to her satisfaction. she is encouraged to contact clinic should she have any questions or concerns prior to her return visit.    Return in about 4 months (around 05/03/2021) for F/U with Pre-visit Labs.  Glade Lloyd, MD [facility] [address] [place], [place] [address] Phone: [phone]  Fax: [phone]   01/01/2021, 9:16 AM  This note was partially dictated with voice recognition software. Similar sounding words can be transcribed inadequately or may not  be corrected upon review.

## 2021-01-06 DIAGNOSIS — B078 Other viral warts: Secondary | ICD-10-CM | POA: Diagnosis not present

## 2021-01-06 DIAGNOSIS — Z85828 Personal history of other malignant neoplasm of skin: Secondary | ICD-10-CM | POA: Diagnosis not present

## 2021-01-06 DIAGNOSIS — L304 Erythema intertrigo: Secondary | ICD-10-CM | POA: Diagnosis not present

## 2021-01-12 ENCOUNTER — Other Ambulatory Visit: Payer: Self-pay | Admitting: Physician Assistant

## 2021-01-13 NOTE — Telephone Encounter (Signed)
Next Visit: 03/28/2021  Last Visit: 12/31/2020  Last Fill: 08/12/2020  DX: Dermatomyositis   Current Dose per office note 12/31/2020: - MTX 0.8 ml every 7 days  Labs: 12/23/2020 CBC, CMP and CK are normal.  Glucose is mildly elevated, probably not a fasting sample.  Okay to refill MTX?

## 2021-01-15 ENCOUNTER — Other Ambulatory Visit: Payer: Self-pay

## 2021-01-15 ENCOUNTER — Ambulatory Visit (INDEPENDENT_AMBULATORY_CARE_PROVIDER_SITE_OTHER): Payer: Medicare Other | Admitting: Cardiology

## 2021-01-15 VITALS — BP 110/70 | HR 68 | Ht 66.0 in | Wt 175.0 lb

## 2021-01-15 DIAGNOSIS — R002 Palpitations: Secondary | ICD-10-CM

## 2021-01-15 DIAGNOSIS — M3390 Dermatopolymyositis, unspecified, organ involvement unspecified: Secondary | ICD-10-CM | POA: Diagnosis not present

## 2021-01-15 DIAGNOSIS — D4989 Neoplasm of unspecified behavior of other specified sites: Secondary | ICD-10-CM

## 2021-01-15 NOTE — Patient Instructions (Signed)
Medication Instructions:  The current medical regimen is effective;  continue present plan and medications.  *If you need a refill on your cardiac medications before your next appointment, please call your pharmacy*  Follow-Up: At [facility], you and your health needs are our priority.  As part of our continuing mission to provide you with exceptional heart care, we have created designated Provider Care Teams.  These Care Teams include your primary Cardiologist (physician) and Advanced Practice Providers (APPs -  Physician Assistants and Nurse Practitioners) who all work together to provide you with the care you need, when you need it.  We recommend signing up for the patient portal called "MyChart".  Sign up information is provided on this After Visit Summary.  MyChart is used to connect with patients for Virtual Visits (Telemedicine).  Patients are able to view lab/test results, encounter notes, upcoming appointments, etc.  Non-urgent messages can be sent to your provider as well.   To learn more about what you can do with MyChart, go to [email or web address].    Your next appointment:   1 year(s)  The format for your next appointment:   In Person  Provider:   Mark Skains, MD   Thank you for choosing [facility]!!    

## 2021-01-15 NOTE — Assessment & Plan Note (Signed)
On low-dose prednisone 3 mg daily.

## 2021-01-15 NOTE — Assessment & Plan Note (Signed)
Evaluated by Dr. Roxan Hockey.  Has been stable for the past several years.  Has declined surgical resection.

## 2021-01-15 NOTE — Assessment & Plan Note (Signed)
No recurrence.  Doing well.  Likely related to prior dermatomyositis flare.

## 2021-01-15 NOTE — Progress Notes (Signed)
Cardiology Office Note:    Date:  01/15/2021   ID:  [name], DOB 01-15-1951, MRN [id number]  PCP:  Caryl Bis, MD   [facility] Providers Cardiologist:  Candee Furbish, MD     Referring MD: Caryl Bis, MD   History of Present Illness:    Marisa Orozco is a 70 y.o. female here for the follow-up of palpitations.  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  Previously history of tachycardia palpitations felt to be an inflammatory response to a dermatomyositis flare.  Back in 2017 her echocardiogram was normal with grade 1 diastolic dysfunction and EF of 70%.  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  Followed by Dr. Roxan Hockey.  Did not want it resected.  Past Medical History:  Diagnosis Date   Allergy    Anxiety    Cancer ([place])    basal cell carcinoma - face   Dermatomyositis ([place]) 02/04/2016   Jo 1 Positive  Skin Bx- Subtle interface Dermatitis    Former smoker 02/04/2016   GERD (gastroesophageal reflux disease)    otc med prn   PONV (postoperative nausea and vomiting)    Sjogren's disease ([place]) 02/04/2016   Positive ANA, Positive Ro, Positive CCP, Negative RF, Parotitis, Positive Sicca   Sjogren's syndrome ([place])    from MD note    Past Surgical History:  Procedure Laterality Date   BREAST SURGERY     augmentation   BUNIONECTOMY     right foot   COLONOSCOPY     DIAGNOSTIC LAPAROSCOPY     DILATION AND CURETTAGE OF UTERUS  04/15/2011   Procedure: DILATATION AND CURETTAGE;  Surgeon: [name];  Location: [facility];  Service: Gynecology;  Laterality: N/A;   MUSCLE BIOPSY Left 01/02/2016   Procedure: MUSCLE BIOPSY LEFT DELTOID;  Surgeon: [name], MD;  Location: [place];  Service: General;  Laterality: Left;  left deltoid muscle bx   NASAL SEPTUM SURGERY     SVD     x 2   TONSILLECTOMY     WISDOM TOOTH EXTRACTION      Current Medications: Current  Meds  Medication Sig   Acetaminophen (TYLENOL PO) Take by mouth as needed.   Cholecalciferol (VITAMIN D3) 1000 UNIT/SPRAY LIQD Take 5 sprays by mouth daily.    folic acid (FOLVITE) 1 MG tablet Take 2 tablets (2 mg total) by mouth daily.   hydroxychloroquine (PLAQUENIL) 200 MG tablet Take 1 tablet (200 mg total) by mouth 2 (two) times daily.   methotrexate 50 MG/2ML injection INJECT 0.8ML INTO THE SKIN ONCE A WEEK   predniSONE (DELTASONE) 1 MG tablet Take 3 mg tablet by mouth daily with breakfast.   TUBERCULIN SYR 1CC/27GX1/2" 27G X 1/2" 1 ML MISC Patient to use weekly to inject Methotrexate   VITAMIN K PO Take 1 tablet by mouth daily.      Allergies:   Codeine, Flagyl [metronidazole hcl], Nexium [esomeprazole magnesium], Imuran [azathioprine], Sulfa antibiotics, and Sulfa drugs cross reactors   Social History   Socioeconomic History   Marital status: Married    Spouse name: Not on file   Number of children: Not on file   Years of education: Not on file   Highest education level: Not on file  Occupational History   Not on file  Tobacco Use   Smoking status: Former    Packs/day: 1.00    Years: 25.00    Pack  years: 25.00    Types: Cigarettes    Quit date: 08/05/2003    Years since quitting: 17.4   Smokeless tobacco: Never  Vaping Use   Vaping Use: Never used  Substance and Sexual Activity   Alcohol use: Yes    Comment: socially - wine   Drug use: No   Sexual activity: Yes    Birth control/protection: None  Other Topics Concern   Not on file  Social History Narrative   Not on file   Social Determinants of Health   Financial Resource Strain: Not on file  Food Insecurity: Not on file  Transportation Needs: Not on file  Physical Activity: Not on file  Stress: Not on file  Social Connections: Not on file     Family History: The patient's family history includes COPD in her mother; Diabetes in her father; Epilepsy in her daughter; Heart disease in her mother; Kidney  disease in her father; Rectal cancer in her maternal uncle. There is no history of Esophageal cancer, Stomach cancer, or Ulcerative colitis.  ROS:   Please see the history of present illness.     All other systems reviewed and are negative.  EKGs/Labs/Other Studies Reviewed:    EKG done today 01/15/2021 shows sinus rhythm 68 bpm no other abnormalities.  Recent Labs: 12/23/2020: ALT 14; BUN 16; Creat 0.67; Hemoglobin 14.6; Platelets 192; Potassium 3.8; Sodium 143; TSH 3.56  Recent Lipid Panel No results found for: CHOL, TRIG, HDL, CHOLHDL, VLDL, LDLCALC, LDLDIRECT   Risk Assessment/Calculations:          Physical Exam:    VS:  BP 110/70 (BP Location: Left Arm, Patient Position: Sitting, Cuff Size: Normal)   Pulse 68   Ht 5\' 6"  (1.676 m)   Wt 175 lb (79.4 kg)   SpO2 98%   BMI 28.25 kg/m     Wt Readings from Last 3 Encounters:  01/15/21 175 lb (79.4 kg)  01/01/21 173 lb 9.6 oz (78.7 kg)  12/31/20 175 lb (79.4 kg)     GEN:  Well nourished, well developed in no acute distress HEENT: Normal NECK: No JVD; No carotid bruits LYMPHATICS: No lymphadenopathy CARDIAC: RRR, no murmurs, rubs, gallops RESPIRATORY:  Clear to auscultation without rales, wheezing or rhonchi  ABDOMEN: Soft, non-tender, non-distended MUSCULOSKELETAL:  No edema; No deformity  SKIN: Warm and dry NEUROLOGIC:  Alert and oriented x 3 PSYCHIATRIC:  Normal affect   ASSESSMENT:    1. Palpitations   2. Thymoma   3. Dermatomyositis ([place])    PLAN:    Palpitations No recurrence.  Doing well.  Likely related to prior dermatomyositis flare.  Thymoma Evaluated by Dr. Roxan Hockey.  Has been stable for the past several years.  Has declined surgical resection.  Dermatomyositis ([facility]) On low-dose prednisone 3 mg daily.   .         Medication Adjustments/Labs and Tests Ordered: Current medicines are reviewed at length with the patient today.  Concerns regarding medicines are outlined above.  Orders  Placed This Encounter  Procedures   EKG 12-Lead   No orders of the defined types were placed in this encounter.   Patient Instructions  Medication Instructions:  The current medical regimen is effective;  continue present plan and medications.  *If you need a refill on your cardiac medications before your next appointment, please call your pharmacy*  Follow-Up: At [facility], you and your health needs are our priority.  As part of our continuing mission to provide you with exceptional  heart care, we have created designated Provider Care Teams.  These Care Teams include your primary Cardiologist (physician) and Advanced Practice Providers (APPs -  Physician Assistants and Nurse Practitioners) who all work together to provide you with the care you need, when you need it.  We recommend signing up for the patient portal called "MyChart".  Sign up information is provided on this After Visit Summary.  MyChart is used to connect with patients for Virtual Visits (Telemedicine).  Patients are able to view lab/test results, encounter notes, upcoming appointments, etc.  Non-urgent messages can be sent to your provider as well.   To learn more about what you can do with MyChart, go to [email or web address].    Your next appointment:   1 year(s)  The format for your next appointment:   In Person  Provider:   Candee Furbish, MD   Thank you for choosing [facility]!!     Signed, Candee Furbish, MD  01/15/2021 5:11 PM    [place]

## 2021-01-24 DIAGNOSIS — Z23 Encounter for immunization: Secondary | ICD-10-CM | POA: Diagnosis not present

## 2021-01-27 ENCOUNTER — Other Ambulatory Visit: Payer: Self-pay | Admitting: Family Medicine

## 2021-01-27 DIAGNOSIS — N9489 Other specified conditions associated with female genital organs and menstrual cycle: Secondary | ICD-10-CM

## 2021-02-03 DIAGNOSIS — M1712 Unilateral primary osteoarthritis, left knee: Secondary | ICD-10-CM | POA: Diagnosis not present

## 2021-02-03 DIAGNOSIS — E7849 Other hyperlipidemia: Secondary | ICD-10-CM | POA: Diagnosis not present

## 2021-02-03 DIAGNOSIS — E039 Hypothyroidism, unspecified: Secondary | ICD-10-CM | POA: Diagnosis not present

## 2021-02-03 DIAGNOSIS — E119 Type 2 diabetes mellitus without complications: Secondary | ICD-10-CM | POA: Diagnosis not present

## 2021-02-04 ENCOUNTER — Ambulatory Visit
Admission: RE | Admit: 2021-02-04 | Discharge: 2021-02-04 | Disposition: A | Discharge status: Home / Self Care | Payer: Medicare Other | Source: Ambulatory Visit | Attending: Family Medicine | Admitting: Family Medicine

## 2021-02-04 ENCOUNTER — Encounter: Payer: Self-pay | Admitting: *Deleted

## 2021-02-04 DIAGNOSIS — I862 Pelvic varices: Secondary | ICD-10-CM | POA: Diagnosis not present

## 2021-02-04 DIAGNOSIS — N9489 Other specified conditions associated with female genital organs and menstrual cycle: Secondary | ICD-10-CM

## 2021-02-04 HISTORY — PX: IR RADIOLOGIST EVAL & MGMT: IMG5224

## 2021-02-04 NOTE — H&P (Signed)
Interventional Radiology - Clinic Visit, Initial H&P    Referring Provider: Caryl Bis, MD  Reason for Visit: Pelvic venous disease    History of Present Illness  Marisa Orozco is a 70 y.o. 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 She presented to her PCP with complaints of several months history of lower abdominal/LLQ discomfort and cramping. She says the pain is similar in quality to premenopausal menstrual cramping. It is on/off, and does not have relationship to the time of day. Maybe a little worse when she lies down, but any worse with prolonged standing or at the end of the day. She is sexually active, and notes that she has experienced LLQ pressure with intercourse.    Additional Past Medical History Past Medical History:  Diagnosis Date   Allergy    Anxiety    Cancer ([place])    basal cell carcinoma - face   Dermatomyositis ([place]) 02/04/2016   Jo 1 Positive  Skin Bx- Subtle interface Dermatitis    Former smoker 02/04/2016   GERD (gastroesophageal reflux disease)    otc med prn   PONV (postoperative nausea and vomiting)    Sjogren's disease ([place]) 02/04/2016   Positive ANA, Positive Ro, Positive CCP, Negative RF, Parotitis, Positive Sicca   Sjogren's syndrome ([place])    from MD note     Surgical History  Past Surgical History:  Procedure Laterality Date   BREAST SURGERY     augmentation   BUNIONECTOMY     right foot   COLONOSCOPY     DIAGNOSTIC LAPAROSCOPY     DILATION AND CURETTAGE OF UTERUS  04/15/2011   Procedure: DILATATION AND CURETTAGE;  Surgeon: [name];  Location: [facility];  Service: Gynecology;  Laterality: N/A;   IR RADIOLOGIST EVAL & MGMT  02/04/2021   MUSCLE BIOPSY Left 01/02/2016   Procedure: MUSCLE BIOPSY LEFT DELTOID;  Surgeon: [name], MD;  Location: [facility];  Service: General;  Laterality:  Left;  left deltoid muscle bx   NASAL SEPTUM SURGERY     SVD     x 2   TONSILLECTOMY     WISDOM TOOTH EXTRACTION       Medications  I have reviewed the current medication list. Refer to chart for details. Current Outpatient Medications  Medication Instructions   Acetaminophen (TYLENOL PO) Oral, As needed   Cholecalciferol (VITAMIN D3) 1000 UNIT/SPRAY LIQD 5 sprays, Oral, Daily   folic acid (FOLVITE) 2 mg, Oral, Daily   hydroxychloroquine (PLAQUENIL) 200 mg, Oral, 2 times daily   methotrexate 50 MG/2ML injection INJECT 0.8ML INTO THE SKIN ONCE A WEEK   predniSONE (DELTASONE) 1 MG tablet Take 3 mg tablet by mouth daily with breakfast.   TUBERCULIN SYR 1CC/27GX1/2" 27G X 1/2" 1 ML MISC Patient to use weekly to inject Methotrexate   VITAMIN K PO 1 tablet, Oral, Daily      Allergies Allergies  Allergen Reactions   Codeine Nausea And Vomiting   Flagyl [Metronidazole Hcl] Other (See Comments)    Reaction muscle weakness   Nexium [Esomeprazole Magnesium] Other (See Comments)    Reaction Muscle weakness   Imuran [Azathioprine] Other (See Comments)    Joint Pain   Sulfa Antibiotics Rash   Sulfa Drugs Cross Reactors Rash     Physical Exam Current Vitals   ( )  Pulse Rate: 85     BP: 134/60  SpO2: 98 %        There is no height or weight on file to calculate BMI.  General: Alert and answers questions appropriately. No apparent distress. HEENT: Normocephalic, atraumatic. Conjunctivae normal without scleral icterus. Cardiac: Regular rate. No dependent edema. Pulmonary: Normal work of breathing. On room air. Abdominal: No distension. Extremities: Normally-formed, well perfused.    Pertinent Lab Results CBC Latest Ref Rng & Units 12/23/2020 06/25/2020 03/26/2020  WBC 3.8 - 10.8 Thousand/uL 7.0 7.1 7.7  Hemoglobin 11.7 - 15.5 g/dL 14.6 14.2 14.7  Hematocrit 35.0 - 45.0 % 44.5 42.5 44.2  Platelets 140 - 400 Thousand/uL 192 154 158   CMP Latest Ref Rng & Units 12/23/2020  06/25/2020 03/26/2020  Glucose 65 - 99 mg/dL 105(H) 93 75  BUN 7 - 25 mg/dL 16 20 21   Creatinine 0.60 - 1.00 mg/dL 0.67 0.80 0.71  Sodium 135 - 146 mmol/L 143 141 143  Potassium 3.5 - 5.3 mmol/L 3.8 4.3 4.8  Chloride 98 - 110 mmol/L 107 107 106  CO2 20 - 32 mmol/L 28 28 29   Calcium 8.6 - 10.4 mg/dL 9.5 9.0 9.7  Total Protein 6.1 - 8.1 g/dL 6.8 6.6 6.9  Total Bilirubin 0.2 - 1.2 mg/dL 0.7 0.4 0.3  Alkaline Phos 33 - 130 U/L - - -  AST 10 - 35 U/L 15 15 22   ALT 6 - 29 U/L 14 14 18       Relevant and/or Recent Imaging: CT A/P 09/27  Personally re-reviewed, dilated left gonadal vein and pelvic varicosities    Assessment & Plan Marisa Orozco is a 70 y.o. female with a history of dermatomyositis who was referred to IR Clinic by Dr. Quillian Quince in consultation for further evaluation and management of pelvic venous disease. Her symptoms and imaging are compatible with mild pelvic venous disease.   These were discussed with the patient, and based on her mild symptoms which she states are tolerable, she wishes to avoid a procedure at this time. I think this is reasonable, as the indication for gonadal vein embolization is essentially symptom driven. Our clinic contact was provided in case her symptoms worsen and she would like to be re-evaluated.     I spent a total of 15 Minutes in face-to-face in clinical consultation, greater than 50% of which was spent on medical decision-making and counseling/coordinating care for pelvic venous disease.     Albin Felling, MD  Vascular and Interventional Radiology 02/04/2021 8:46 AM

## 2021-03-02 ENCOUNTER — Other Ambulatory Visit: Payer: Self-pay | Admitting: Rheumatology

## 2021-03-02 DIAGNOSIS — M3501 Sicca syndrome with keratoconjunctivitis: Secondary | ICD-10-CM

## 2021-03-03 MED ORDER — HYDROXYCHLOROQUINE SULFATE 200 MG PO TABS: 200.0000 mg | ORAL_TABLET | Freq: Two times a day (BID) | ORAL | 0 refills | Status: DC

## 2021-03-03 NOTE — Telephone Encounter (Signed)
Next Visit: 03/28/2021   Last Visit: 12/31/2020   Last Fill: 10/08/2020   DX: Dermatomyositis    Current Dose per office note 12/31/2020: Plaquenil 200 mg 1 tablet QD   Labs: 12/23/2020 CBC, CMP and CK are normal.  Glucose is mildly elevated, probably not a fasting sample.  PLQ Eye Exam: 10/14/2020 WNL    Okay to refill PLQ?

## 2021-03-14 NOTE — Progress Notes (Deleted)
Office Visit Note  Patient: Marisa Orozco             Date of Birth: Feb 02, 1951           MRN: [id number]             PCP: Caryl Bis, MD Referring: Caryl Bis, MD Visit Date: 03/28/2021 Occupation: @GUAROCC @  Subjective:  No chief complaint on file.   History of Present Illness: Marisa Orozco is a 70 y.o. female ***   Activities of Daily Living:  Patient reports morning stiffness for *** {minute/hour:19697}.   Patient {ACTIONS;DENIES/REPORTS:21021675::"Denies"} nocturnal pain.  Difficulty dressing/grooming: {ACTIONS;DENIES/REPORTS:21021675::"Denies"} Difficulty climbing stairs: {ACTIONS;DENIES/REPORTS:21021675::"Denies"} Difficulty getting out of chair: {ACTIONS;DENIES/REPORTS:21021675::"Denies"} Difficulty using hands for taps, buttons, cutlery, and/or writing: {ACTIONS;DENIES/REPORTS:21021675::"Denies"}  No Rheumatology ROS completed.   PMFS History:  Patient Active Problem List   Diagnosis Date Noted   Palpitations 01/15/2021   Hypothyroidism 07/26/2019   Primary osteoarthritis of right knee 02/03/2018   Primary osteoarthritis of both hands 09/08/2017   Sjogren's disease ([place]) 02/04/2016   Dermatomyositis ([place]) 02/04/2016   Former smoker 02/04/2016   Thymoma 02/04/2016   Fatigue 02/15/2012    Past Medical History:  Diagnosis Date   Allergy    Anxiety    Cancer ([place])    basal cell carcinoma - face   Dermatomyositis ([place]) 02/04/2016   Jo 1 Positive  Skin Bx- Subtle interface Dermatitis    Former smoker 02/04/2016   GERD (gastroesophageal reflux disease)    otc med prn   PONV (postoperative nausea and vomiting)    Sjogren's disease ([place]) 02/04/2016   Positive ANA, Positive Ro, Positive CCP, Negative RF, Parotitis, Positive Sicca   Sjogren's syndrome ([place])    from MD note    Family History  Problem Relation Age of Onset   COPD Mother    Heart disease Mother    Diabetes Father    Kidney disease Father    Epilepsy Daughter    Rectal cancer  Maternal Uncle    Esophageal cancer Neg Hx    Stomach cancer Neg Hx    Ulcerative colitis Neg Hx    Past Surgical History:  Procedure Laterality Date   BREAST SURGERY     augmentation   BUNIONECTOMY     right foot   COLONOSCOPY     DIAGNOSTIC LAPAROSCOPY     DILATION AND CURETTAGE OF UTERUS  04/15/2011   Procedure: DILATATION AND CURETTAGE;  Surgeon: [name];  Location: [facility];  Service: Gynecology;  Laterality: N/A;   IR RADIOLOGIST EVAL & MGMT  02/04/2021   MUSCLE BIOPSY Left 01/02/2016   Procedure: MUSCLE BIOPSY LEFT DELTOID;  Surgeon: [name], MD;  Location: [facility];  Service: General;  Laterality: Left;  left deltoid muscle bx   NASAL SEPTUM SURGERY     SVD     x 2   TONSILLECTOMY     WISDOM TOOTH EXTRACTION     Social History   Social History Narrative   Not on file   Immunization History  Administered Date(s) Administered   Influenza, High Dose Seasonal PF 01/07/2018   Influenza-Unspecified 01/04/2014   PFIZER(Purple Top)SARS-COV-2 Vaccination 04/26/2019, 05/17/2019, 12/12/2019   Pneumococcal Conjugate-13 01/05/2016   Zoster Recombinat (Shingrix) 08/11/2017, 12/17/2017     Objective: Vital Signs: There were no vitals taken for this visit.   Physical Exam   Musculoskeletal Exam: ***  CDAI Exam: CDAI Score: -- Patient Global: --; Provider Global: -- Swollen: --; Tender: --  Joint Exam 03/28/2021   No joint exam has been documented for this visit   There is currently no information documented on the homunculus. Go to the Rheumatology activity and complete the homunculus joint exam.  Investigation: No additional findings.  Imaging: No results found.  Recent Labs: Lab Results  Component Value Date   WBC 7.0 12/23/2020   HGB 14.6 12/23/2020   PLT 192 12/23/2020   NA 143 12/23/2020   K 3.8 12/23/2020   CL 107 12/23/2020   CO2 28 12/23/2020   GLUCOSE 105 (H) 12/23/2020   BUN 16 12/23/2020   CREATININE 0.67 12/23/2020    BILITOT 0.7 12/23/2020   ALKPHOS 44 10/12/2016   AST 15 12/23/2020   ALT 14 12/23/2020   PROT 6.8 12/23/2020   ALBUMIN 4.2 10/12/2016   CALCIUM 9.5 12/23/2020   GFRAA 87 06/25/2020   QFTBGOLDPLUS NEGATIVE 07/07/2017    Speciality Comments: PLQ Eye Exam: 10/14/2020 WNL @ [facility]. Follow up 1 year.  Procedures:  No procedures performed Allergies: Codeine, Flagyl [metronidazole hcl], Nexium [esomeprazole magnesium], Imuran [azathioprine], Sulfa antibiotics, and Sulfa drugs cross reactors   Assessment / Plan:     Visit Diagnoses: No diagnosis found.  Orders: No orders of the defined types were placed in this encounter.  No orders of the defined types were placed in this encounter.   Face-to-face time spent with patient was *** minutes. Greater than 50% of time was spent in counseling and coordination of care.  Follow-Up Instructions: No follow-ups on file.   [name], CMA  Note - This record has been created using Editor, commissioning.  Chart creation errors have been sought, but may not always  have been located. Such creation errors do not reflect on  the standard of medical care.

## 2021-03-17 DIAGNOSIS — M3390 Dermatopolymyositis, unspecified, organ involvement unspecified: Secondary | ICD-10-CM | POA: Diagnosis not present

## 2021-03-17 DIAGNOSIS — L570 Actinic keratosis: Secondary | ICD-10-CM | POA: Diagnosis not present

## 2021-03-17 DIAGNOSIS — D225 Melanocytic nevi of trunk: Secondary | ICD-10-CM | POA: Diagnosis not present

## 2021-03-17 DIAGNOSIS — D2261 Melanocytic nevi of right upper limb, including shoulder: Secondary | ICD-10-CM | POA: Diagnosis not present

## 2021-03-17 DIAGNOSIS — D2262 Melanocytic nevi of left upper limb, including shoulder: Secondary | ICD-10-CM | POA: Diagnosis not present

## 2021-03-17 DIAGNOSIS — Q825 Congenital non-neoplastic nevus: Secondary | ICD-10-CM | POA: Diagnosis not present

## 2021-03-17 DIAGNOSIS — Z85828 Personal history of other malignant neoplasm of skin: Secondary | ICD-10-CM | POA: Diagnosis not present

## 2021-03-17 DIAGNOSIS — D2272 Melanocytic nevi of left lower limb, including hip: Secondary | ICD-10-CM | POA: Diagnosis not present

## 2021-03-17 DIAGNOSIS — D2371 Other benign neoplasm of skin of right lower limb, including hip: Secondary | ICD-10-CM | POA: Diagnosis not present

## 2021-03-22 ENCOUNTER — Other Ambulatory Visit: Payer: Self-pay | Admitting: Physician Assistant

## 2021-03-24 NOTE — Progress Notes (Signed)
Office Visit Note  Patient: Marisa Orozco             Date of Birth: Oct 27, 1950           MRN: [id number]             PCP: Caryl Bis, MD Referring: Caryl Bis, MD Visit Date: 03/25/2021 Occupation: @GUAROCC @  Subjective:  Follow-up (Doing good)   History of Present Illness: Marisa Orozco is a 70 y.o. female with a history of dermatomyositis, Sjogren's and osteoarthritis.  She states she has been doing well.  She tried to taper prednisone down to 2.5 mg p.o. daily but could not tolerate it.  She states on decreasing the prednisone dose she starts experiencing increased joint pain and sicca symptoms.  She denies any muscular weakness or tenderness.  She denies any rash.  Activities of Daily Living:  Patient reports morning stiffness for 0  none .   Patient Reports nocturnal pain.  Difficulty dressing/grooming: Denies Difficulty climbing stairs: Reports Difficulty getting out of chair: Reports Difficulty using hands for taps, buttons, cutlery, and/or writing: Denies  Review of Systems  Constitutional:  Positive for fatigue. Negative for night sweats, weight gain and weight loss.  HENT:  Positive for mouth dryness. Negative for mouth sores, trouble swallowing, trouble swallowing and nose dryness.   Eyes:  Positive for dryness. Negative for pain, redness and visual disturbance.  Respiratory:  Negative for cough, shortness of breath and difficulty breathing.   Cardiovascular:  Negative for chest pain, palpitations, hypertension, irregular heartbeat and swelling in legs/feet.  Gastrointestinal:  Negative for blood in stool, constipation and diarrhea.  Endocrine: Negative for excessive thirst and increased urination.  Genitourinary:  Negative for difficulty urinating and vaginal dryness.  Musculoskeletal:  Positive for joint pain, joint pain, muscle weakness and muscle tenderness. Negative for joint swelling, myalgias, morning stiffness and myalgias.  Skin:  Negative for  color change, rash, hair loss, skin tightness, ulcers and sensitivity to sunlight.  Allergic/Immunologic: Negative for susceptible to infections.  Neurological:  Positive for numbness. Negative for dizziness, memory loss, night sweats and weakness.  Hematological:  Negative for bruising/bleeding tendency and swollen glands.  Psychiatric/Behavioral:  Positive for sleep disturbance. Negative for depressed mood. The patient is not nervous/anxious.    PMFS History:  Patient Active Problem List   Diagnosis Date Noted   Palpitations 01/15/2021   Hypothyroidism 07/26/2019   Primary osteoarthritis of right knee 02/03/2018   Primary osteoarthritis of both hands 09/08/2017   Sjogren's disease ([place]) 02/04/2016   Dermatomyositis ([place]) 02/04/2016   Former smoker 02/04/2016   Thymoma 02/04/2016   Fatigue 02/15/2012    Past Medical History:  Diagnosis Date   Allergy    Anxiety    Cancer ([place])    basal cell carcinoma - face   Dermatomyositis ([place]) 02/04/2016   Jo 1 Positive  Skin Bx- Subtle interface Dermatitis    Former smoker 02/04/2016   GERD (gastroesophageal reflux disease)    otc med prn   PONV (postoperative nausea and vomiting)    Raynaud's disease    Sjogren's disease ([place]) 02/04/2016   Positive ANA, Positive Ro, Positive CCP, Negative RF, Parotitis, Positive Sicca   Sjogren's syndrome ([place])    from MD note    Family History  Problem Relation Age of Onset   COPD Mother    Heart disease Mother    Diabetes Father    Kidney disease Father    Epilepsy Daughter    Rectal  cancer Maternal Uncle    Esophageal cancer Neg Hx    Stomach cancer Neg Hx    Ulcerative colitis Neg Hx    Past Surgical History:  Procedure Laterality Date   BREAST SURGERY     augmentation   BUNIONECTOMY     right foot   COLONOSCOPY     DIAGNOSTIC LAPAROSCOPY     DILATION AND CURETTAGE OF UTERUS  04/15/2011   Procedure: DILATATION AND CURETTAGE;  Surgeon: [name];  Location: [facility];  Service:  Gynecology;  Laterality: N/A;   IR RADIOLOGIST EVAL & MGMT  02/04/2021   MUSCLE BIOPSY Left 01/02/2016   Procedure: MUSCLE BIOPSY LEFT DELTOID;  Surgeon: [name], MD;  Location: [facility];  Service: General;  Laterality: Left;  left deltoid muscle bx   NASAL SEPTUM SURGERY     SVD     x 2   TONSILLECTOMY     WISDOM TOOTH EXTRACTION     Social History   Social History Narrative   Not on file   Immunization History  Administered Date(s) Administered   Influenza, High Dose Seasonal PF 01/07/2018   Influenza-Unspecified 01/04/2014   PFIZER(Purple Top)SARS-COV-2 Vaccination 04/26/2019, 05/17/2019, 12/12/2019   Pneumococcal Conjugate-13 01/05/2016   Zoster Recombinat (Shingrix) 08/11/2017, 12/17/2017     Objective: Vital Signs: BP 122/69 (BP Location: Left Arm, Patient Position: Sitting, Cuff Size: Normal)    Pulse 76    Resp 16    Ht 5\' 6"  (1.676 m)    Wt 176 lb (79.8 kg)    BMI 28.41 kg/m    Physical Exam Vitals and nursing note reviewed.  Constitutional:      Appearance: She is well-developed.  HENT:     Head: Normocephalic and atraumatic.  Eyes:     Conjunctiva/sclera: Conjunctivae normal.  Cardiovascular:     Rate and Rhythm: Normal rate and regular rhythm.     Heart sounds: Normal heart sounds.  Pulmonary:     Effort: Pulmonary effort is normal.     Breath sounds: Normal breath sounds.  Abdominal:     General: Bowel sounds are normal.     Palpations: Abdomen is soft.  Musculoskeletal:     Cervical back: Normal range of motion.  Lymphadenopathy:     Cervical: No cervical adenopathy.  Skin:    General: Skin is warm and dry.     Capillary Refill: Capillary refill takes less than 2 seconds.  Neurological:     Mental Status: She is alert and oriented to person, place, and time.  Psychiatric:        Behavior: Behavior normal.     Musculoskeletal Exam: C-spine was in good range of motion.  Shoulder joints, elbow joints, wrist joints, MCPs PIPs  and DIPs with good range of motion with no synovitis.  Hip joints, knee joints, ankles, MTPs and PIPs with good range of motion with no synovitis.  She had no muscular weakness or tenderness.  CDAI Exam: CDAI Score: -- Patient Global: --; Provider Global: -- Swollen: --; Tender: -- Joint Exam 03/25/2021   No joint exam has been documented for this visit   There is currently no information documented on the homunculus. Go to the Rheumatology activity and complete the homunculus joint exam.  Investigation: No additional findings.  Imaging: No results found.  Recent Labs: Lab Results  Component Value Date   WBC 7.0 12/23/2020   HGB 14.6 12/23/2020   PLT 192 12/23/2020   NA 143 12/23/2020  K 3.8 12/23/2020   CL 107 12/23/2020   CO2 28 12/23/2020   GLUCOSE 105 (H) 12/23/2020   BUN 16 12/23/2020   CREATININE 0.67 12/23/2020   BILITOT 0.7 12/23/2020   ALKPHOS 44 10/12/2016   AST 15 12/23/2020   ALT 14 12/23/2020   PROT 6.8 12/23/2020   ALBUMIN 4.2 10/12/2016   CALCIUM 9.5 12/23/2020   GFRAA 87 06/25/2020   QFTBGOLDPLUS NEGATIVE 07/07/2017    Speciality Comments: PLQ Eye Exam: 10/14/2020 WNL @ [facility]. Follow up 1 year.  Procedures:  No procedures performed Allergies: Codeine, Flagyl [metronidazole hcl], Nexium [esomeprazole magnesium], Imuran [azathioprine], Sulfa antibiotics, and Sulfa drugs cross reactors   Assessment / Plan:     Visit Diagnoses: Dermatomyositis ([place]) - Jo 1+, Positive muscle biopsy for dermatomyositis: She had no muscular weakness or tenderness on examination.  She is doing well on the combination of methotrexate and Plaquenil.  Sjogren's syndrome with keratoconjunctivitis sicca ([facility])-her symptoms are manageable with over-the-counter products.  High risk medication use - MTX 0.8 ml every 7 days, folic acid 2mg  po qd, Plaquenil 200 mg 1 tablet QD,prednisone 3.5 mg every day. PLQ Eye Exam: 10/14/2020.  Labs from September 2022 were  reviewed which were within normal limits.  She will get labs with her PCP which will include CBC with differential, CMP with GFR and CK.  She has been advised to get labs every 3 months.  Long-term current use of systemic steroids-side effects of long-term prednisone use were discussed.  Patient still able to taper prednisone at this point.  Primary osteoarthritis of both hands-joint protection muscle strengthening was discussed.  Primary osteoarthritis of both knees-no warmth swelling or effusion was noted.  Age-related osteoporosis without current pathological fracture - DEXA on 06/02/2018: AP spine T score -2.7 with BMD 0.95.  -15.2% change from previous DEXA.  Patient states she had a repeat DEXA with a T score of -2.2.  She wants to hold off on the treatment of osteoporosis.  Should bring the repeat DEXA scan results.  I briefly discussed the option of IV Reclast for the future.  Information was provided.  History of vitamin D deficiency-she has been taking vitamin D and her vitamin D normal has been normal.  LLQ abdominal pain - She had colonoscopy about 4 years ago which was negative.  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  She states she had IR radiology consulted and they recommended holding the vein but she decided against it.  Thymoma  Hair loss-improving.  Former smoker  COVID-19 virus infection - she developed an infection in February 2022.  Orders: No orders of the defined types were placed in this encounter.  No orders of the defined types were placed in this encounter.    Follow-Up Instructions: Return in about 3 months (around 06/23/2021) for DM, Sjogren's, Osteoarthritis.   Bo Merino, MD  Note - This record has been created using Editor, commissioning.  Chart creation errors have been sought, but may not always  have been located. Such creation errors do not  reflect on  the standard of medical care.

## 2021-03-24 NOTE — Telephone Encounter (Signed)
Next Visit: 03/28/2021   Last Visit: 12/31/2020   Last Fill: 01/13/2021   DX: Dermatomyositis    Current Dose per office note 12/31/2020: - MTX 0.8 ml every 7 days   Labs: 12/23/2020 CBC, CMP and CK are normal.  Glucose is mildly elevated, probably not a fasting sample.   Okay to refill MTX?

## 2021-03-25 ENCOUNTER — Encounter: Payer: Self-pay | Admitting: Rheumatology

## 2021-03-25 ENCOUNTER — Ambulatory Visit (INDEPENDENT_AMBULATORY_CARE_PROVIDER_SITE_OTHER): Payer: Medicare Other | Admitting: Rheumatology

## 2021-03-25 ENCOUNTER — Other Ambulatory Visit: Payer: Self-pay

## 2021-03-25 VITALS — BP 122/69 | HR 76 | Resp 16 | Ht 66.0 in | Wt 176.0 lb

## 2021-03-25 DIAGNOSIS — U071 COVID-19: Secondary | ICD-10-CM

## 2021-03-25 DIAGNOSIS — M3501 Sicca syndrome with keratoconjunctivitis: Secondary | ICD-10-CM | POA: Diagnosis not present

## 2021-03-25 DIAGNOSIS — M810 Age-related osteoporosis without current pathological fracture: Secondary | ICD-10-CM | POA: Diagnosis not present

## 2021-03-25 DIAGNOSIS — R1032 Left lower quadrant pain: Secondary | ICD-10-CM

## 2021-03-25 DIAGNOSIS — D4989 Neoplasm of unspecified behavior of other specified sites: Secondary | ICD-10-CM | POA: Diagnosis not present

## 2021-03-25 DIAGNOSIS — M3313 Other dermatomyositis without myopathy: Secondary | ICD-10-CM

## 2021-03-25 DIAGNOSIS — M19041 Primary osteoarthritis, right hand: Secondary | ICD-10-CM | POA: Diagnosis not present

## 2021-03-25 DIAGNOSIS — M19042 Primary osteoarthritis, left hand: Secondary | ICD-10-CM

## 2021-03-25 DIAGNOSIS — Z87891 Personal history of nicotine dependence: Secondary | ICD-10-CM | POA: Diagnosis not present

## 2021-03-25 DIAGNOSIS — M170 Bilateral primary osteoarthritis of knee: Secondary | ICD-10-CM | POA: Diagnosis not present

## 2021-03-25 DIAGNOSIS — Z79899 Other long term (current) drug therapy: Secondary | ICD-10-CM | POA: Diagnosis not present

## 2021-03-25 DIAGNOSIS — M3390 Dermatopolymyositis, unspecified, organ involvement unspecified: Secondary | ICD-10-CM

## 2021-03-25 DIAGNOSIS — L659 Nonscarring hair loss, unspecified: Secondary | ICD-10-CM | POA: Diagnosis not present

## 2021-03-25 DIAGNOSIS — Z8639 Personal history of other endocrine, nutritional and metabolic disease: Secondary | ICD-10-CM

## 2021-03-25 DIAGNOSIS — Z7952 Long term (current) use of systemic steroids: Secondary | ICD-10-CM

## 2021-03-25 NOTE — Patient Instructions (Addendum)
Standing Labs We placed an order today for your standing lab work.   Please have your standing labs drawn in March and every 3 months  If possible, please have your labs drawn 2 weeks prior to your appointment so that the provider can discuss your results at your appointment.  Please note that you may see your imaging and lab results in [facility] before we have reviewed them. We may be awaiting multiple results to interpret others before contacting you. Please allow our office up to 72 hours to thoroughly review all of the results before contacting the office for clarification of your results.  We have open lab daily: Monday through Thursday from 1:30-4:30 PM and Friday from 1:30-4:00 PM at the office of Dr. Bo Merino, [facility].   Please be advised, all patients with office appointments requiring lab work will take precedent over walk-in lab work.  If possible, please come for your lab work on Monday and Friday afternoons, as you may experience shorter wait times. The office is located at [address] [place], [place], [place] [address] No appointment is necessary.   Labs are drawn by [facility]. Please bring your co-pay at the time of your lab draw.  You may receive a bill from [facility] for your lab work.  If you wish to have your labs drawn at another location, please call the office 24 hours in advance to send orders.  If you have any questions regarding directions or hours of operation,  please call [phone].   As a reminder, please drink plenty of water prior to coming for your lab work. Thanks!   Vaccines You are taking a medication(s) that can suppress your immune system.  The following immunizations are recommended: Flu annually Covid-19  Td/Tdap (tetanus, diphtheria, pertussis) every 10 years Pneumonia (Prevnar 15 then Pneumovax 23 at least 1 year apart.  Alternatively, can take Prevnar 20 without needing additional dose) Shingrix: 2 doses from 4 weeks  to 6 months apart  Please check with your PCP to make sure you are up to date.  If you test POSITIVE for COVID19 and have MILD to MODERATE symptoms: First, call your PCP if you would like to receive COVID19 treatment AND Hold your medications during the infection and for at least 1 week after your symptoms have resolved: Injectable medication (Benlysta, Cimzia, Cosentyx, Enbrel, Humira, Orencia, Remicade, Simponi, Stelara, Taltz, Tremfya) Methotrexate Leflunomide (Arava) Azathioprine Mycophenolate (Cellcept) Roma Kayser, or Rinvoq Otezla If you take Actemra or Kevzara, you DO NOT need to hold these for COVID19 infection.    Zoledronic Acid Injection (Paget's Disease, Osteoporosis) What is this medication? ZOLEDRONIC ACID (ZOE le dron ik AS id) slows calcium loss from bones. It treats Paget's disease and osteoporosis. It may be used in other people at risk for bone loss. This medicine may be used for other purposes; ask your health care provider or pharmacist if you have questions. COMMON BRAND NAME(S): Reclast, Zometa, Zometa Powder What should I tell my care team before I take this medication? They need to know if you have any of these conditions: bleeding disorder cancer dental disease kidney disease low levels of calcium in the blood low red blood cell counts lung or breathing disease (asthma) receiving steroids like dexamethasone or prednisone an unusual or allergic reaction to zoledronic acid, other medicines, foods, dyes, or preservatives pregnant or trying to get pregnant breast-feeding How should I use this medication? This drug is injected into a vein. It is given by a  health care provider in a hospital or clinic setting. A special MedGuide will be given to you before each treatment. Be sure to read this information carefully each time. Talk to your health care provider about the use of this drug in children. Special care may be needed. Overdosage: If you think  you have taken too much of this medicine contact a poison control center or emergency room at once. NOTE: This medicine is only for you. Do not share this medicine with others. What if I miss a dose? Keep appointments for follow-up doses. It is important not to miss your dose. Call your health care provider if you are unable to keep an appointment. What may interact with this medication? certain antibiotics given by injection NSAIDs, medicines for pain and inflammation, like ibuprofen or naproxen some diuretics like bumetanide, furosemide teriparatide This list may not describe all possible interactions. Give your health care provider a list of all the medicines, herbs, non-prescription drugs, or dietary supplements you use. Also tell them if you smoke, drink alcohol, or use illegal drugs. Some items may interact with your medicine. What should I watch for while using this medication? Visit your health care provider for regular checks on your progress. It may be some time before you see the benefit from this drug. Some people who take this drug have severe bone, joint, or muscle pain. This drug may also increase your risk for jaw problems or a broken thigh bone. Tell your health care provider right away if you have severe pain in your jaw, bones, joints, or muscles. Tell you health care provider if you have any pain that does not go away or that gets worse. You should make sure you get enough calcium and vitamin D while you are taking this drug. Discuss the foods you eat and the vitamins you take with your health care provider. You may need blood work done while you are taking this drug. Tell your dentist and dental surgeon that you are taking this drug. You should not have major dental surgery while on this drug. See your dentist to have a dental exam and fix any dental problems before starting this drug. Take good care of your teeth while on this drug. Make sure you see your dentist for regular  follow-up appointments. What side effects may I notice from receiving this medication? Side effects that you should report to your doctor or health care provider as soon as possible: allergic reactions (skin rash, itching or hives; swelling of the face, lips, or tongue) bone pain infection (fever, chills, cough, sore throat, pain or trouble passing urine) jaw pain, especially after dental work joint pain kidney injury (trouble passing urine or change in the amount of urine) low calcium levels (fast heartbeat; muscle cramps or pain; pain, tingling, or numbness in the hands or feet; seizures) low red blood cell counts (trouble breathing; feeling faint; lightheaded, falls; unusually weak or tired) muscle pain palpitations redness, blistering, peeling, or loosening of the skin, including inside the mouth Side effects that usually do not require medical attention (report to your doctor or health care provider if they continue or are bothersome): diarrhea eye irritation, itching, or pain fever general ill feeling or flu-like symptoms headache increase in blood pressure nausea pain, redness, or irritation at site where injected stomach pain upset stomach This list may not describe all possible side effects. Call your doctor for medical advice about side effects. You may report side effects to FDA at 1-800-FDA-1088. Where should  I keep my medication? This drug is given in a hospital or clinic. It will not be stored at home. NOTE: This sheet is a summary. It may not cover all possible information. If you have questions about this medicine, talk to your doctor, pharmacist, or health care provider.  2022 Elsevier/Gold Standard (2020-12-10 00:00:00)

## 2021-03-28 ENCOUNTER — Ambulatory Visit: Payer: Medicare Other | Admitting: Rheumatology

## 2021-03-28 DIAGNOSIS — M3501 Sicca syndrome with keratoconjunctivitis: Secondary | ICD-10-CM

## 2021-03-28 DIAGNOSIS — U071 COVID-19: Secondary | ICD-10-CM

## 2021-03-28 DIAGNOSIS — Z8639 Personal history of other endocrine, nutritional and metabolic disease: Secondary | ICD-10-CM

## 2021-03-28 DIAGNOSIS — Z79899 Other long term (current) drug therapy: Secondary | ICD-10-CM

## 2021-03-28 DIAGNOSIS — R1032 Left lower quadrant pain: Secondary | ICD-10-CM

## 2021-03-28 DIAGNOSIS — M3390 Dermatopolymyositis, unspecified, organ involvement unspecified: Secondary | ICD-10-CM

## 2021-03-28 DIAGNOSIS — L659 Nonscarring hair loss, unspecified: Secondary | ICD-10-CM

## 2021-03-28 DIAGNOSIS — D4989 Neoplasm of unspecified behavior of other specified sites: Secondary | ICD-10-CM

## 2021-03-28 DIAGNOSIS — Z87891 Personal history of nicotine dependence: Secondary | ICD-10-CM

## 2021-03-28 DIAGNOSIS — M19042 Primary osteoarthritis, left hand: Secondary | ICD-10-CM

## 2021-03-28 DIAGNOSIS — M170 Bilateral primary osteoarthritis of knee: Secondary | ICD-10-CM

## 2021-03-28 DIAGNOSIS — M810 Age-related osteoporosis without current pathological fracture: Secondary | ICD-10-CM

## 2021-04-02 ENCOUNTER — Encounter: Payer: Self-pay | Admitting: Rheumatology

## 2021-04-02 MED ORDER — FOLIC ACID 1 MG PO TABS: 2.0000 mg | ORAL_TABLET | Freq: Every day | ORAL | 3 refills | Status: DC

## 2021-04-02 NOTE — Telephone Encounter (Signed)
Next Visit: 06/27/2021  Last Visit: 03/25/2021  Last Fill: 06/17/2020  DX: Dermatomyositis   Current Dose per office note on 97/67/3419: folic acid 2mg  po qd  Okay to refill folic acid?

## 2021-04-04 DIAGNOSIS — E7849 Other hyperlipidemia: Secondary | ICD-10-CM | POA: Diagnosis not present

## 2021-04-04 DIAGNOSIS — E119 Type 2 diabetes mellitus without complications: Secondary | ICD-10-CM | POA: Diagnosis not present

## 2021-04-04 DIAGNOSIS — M1712 Unilateral primary osteoarthritis, left knee: Secondary | ICD-10-CM | POA: Diagnosis not present

## 2021-04-04 DIAGNOSIS — E039 Hypothyroidism, unspecified: Secondary | ICD-10-CM | POA: Diagnosis not present

## 2021-04-08 ENCOUNTER — Encounter: Payer: Self-pay | Admitting: Rheumatology

## 2021-04-09 MED ORDER — PREDNISONE 1 MG PO TABS: ORAL_TABLET | ORAL | 5 refills | Status: DC

## 2021-04-09 NOTE — Telephone Encounter (Signed)
Next Visit: 06/27/2021  Last Visit: 03/25/2021  Last Fill: 10/02/2020  Dx: Dermatomyositis   Current Dose per office note on 03/25/2021: prednisone 3.5 mg every day  Okay to refill Prednisone?

## 2021-04-14 DIAGNOSIS — C37 Malignant neoplasm of thymus: Secondary | ICD-10-CM | POA: Diagnosis not present

## 2021-04-14 DIAGNOSIS — E039 Hypothyroidism, unspecified: Secondary | ICD-10-CM | POA: Diagnosis not present

## 2021-04-14 DIAGNOSIS — M3501 Sicca syndrome with keratoconjunctivitis: Secondary | ICD-10-CM | POA: Diagnosis not present

## 2021-04-14 DIAGNOSIS — K2100 Gastro-esophageal reflux disease with esophagitis, without bleeding: Secondary | ICD-10-CM | POA: Diagnosis not present

## 2021-04-14 DIAGNOSIS — E119 Type 2 diabetes mellitus without complications: Secondary | ICD-10-CM | POA: Diagnosis not present

## 2021-04-14 DIAGNOSIS — R7301 Impaired fasting glucose: Secondary | ICD-10-CM | POA: Diagnosis not present

## 2021-04-14 DIAGNOSIS — E782 Mixed hyperlipidemia: Secondary | ICD-10-CM | POA: Diagnosis not present

## 2021-04-14 DIAGNOSIS — R030 Elevated blood-pressure reading, without diagnosis of hypertension: Secondary | ICD-10-CM | POA: Diagnosis not present

## 2021-04-14 DIAGNOSIS — E7849 Other hyperlipidemia: Secondary | ICD-10-CM | POA: Diagnosis not present

## 2021-04-14 DIAGNOSIS — E8881 Metabolic syndrome: Secondary | ICD-10-CM | POA: Diagnosis not present

## 2021-04-14 LAB — COMPREHENSIVE METABOLIC PANEL: Calcium: 9.3 (ref 8.7–10.7)

## 2021-04-14 LAB — BASIC METABOLIC PANEL
BUN: 17 (ref 4–21)
Creatinine: 0.7 (ref 0.5–1.1)

## 2021-04-14 LAB — LIPID PANEL
Cholesterol: 157 (ref 0–200)
HDL: 43 (ref 35–70)
LDL Cholesterol: 98
Triglycerides: 86 (ref 40–160)

## 2021-04-14 LAB — TSH: TSH: 3.2 (ref 0.41–5.90)

## 2021-04-14 LAB — HEMOGLOBIN A1C: Hemoglobin A1C: 5.8

## 2021-04-18 DIAGNOSIS — E7849 Other hyperlipidemia: Secondary | ICD-10-CM | POA: Diagnosis not present

## 2021-04-18 DIAGNOSIS — C37 Malignant neoplasm of thymus: Secondary | ICD-10-CM | POA: Diagnosis not present

## 2021-04-18 DIAGNOSIS — G43109 Migraine with aura, not intractable, without status migrainosus: Secondary | ICD-10-CM | POA: Diagnosis not present

## 2021-04-18 DIAGNOSIS — M3390 Dermatopolymyositis, unspecified, organ involvement unspecified: Secondary | ICD-10-CM | POA: Diagnosis not present

## 2021-04-18 DIAGNOSIS — I700 Atherosclerosis of aorta: Secondary | ICD-10-CM | POA: Diagnosis not present

## 2021-04-18 DIAGNOSIS — K7581 Nonalcoholic steatohepatitis (NASH): Secondary | ICD-10-CM | POA: Diagnosis not present

## 2021-04-18 DIAGNOSIS — E8881 Metabolic syndrome: Secondary | ICD-10-CM | POA: Diagnosis not present

## 2021-04-18 DIAGNOSIS — Z0001 Encounter for general adult medical examination with abnormal findings: Secondary | ICD-10-CM | POA: Diagnosis not present

## 2021-05-01 ENCOUNTER — Encounter: Payer: Self-pay | Admitting: Rheumatology

## 2021-05-01 NOTE — Telephone Encounter (Signed)
Results from 04/14/21 were reviewed today in the office.   CBC WNL.  Creatinine WNL-0.72 and GFR WNL-90.  LFTs WNL.  CK WNL-74.

## 2021-05-04 DIAGNOSIS — E039 Hypothyroidism, unspecified: Secondary | ICD-10-CM | POA: Diagnosis not present

## 2021-05-04 DIAGNOSIS — E7849 Other hyperlipidemia: Secondary | ICD-10-CM | POA: Diagnosis not present

## 2021-05-05 ENCOUNTER — Other Ambulatory Visit: Payer: Self-pay

## 2021-05-05 ENCOUNTER — Ambulatory Visit (INDEPENDENT_AMBULATORY_CARE_PROVIDER_SITE_OTHER): Payer: Medicare Other | Admitting: "Endocrinology

## 2021-05-05 ENCOUNTER — Encounter: Payer: Self-pay | Admitting: "Endocrinology

## 2021-05-05 VITALS — BP 120/66 | HR 88 | Ht 66.0 in | Wt 173.8 lb

## 2021-05-05 DIAGNOSIS — E039 Hypothyroidism, unspecified: Secondary | ICD-10-CM

## 2021-05-05 DIAGNOSIS — R7303 Prediabetes: Secondary | ICD-10-CM | POA: Diagnosis not present

## 2021-05-05 NOTE — Progress Notes (Signed)
Orozco/30/2023, 12:22 PM       Endocrinology follow-up note   Marisa Orozco is a 71 y.o.-year-old female patient being seen in follow-up for hypothyroidism. PMD:  Marisa Bis, MD.   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   Marisa Orozco Positive  Skin Bx- Subtle interface Dermatitis    Former smoker 02/04/2016   GERD (gastroesophageal reflux disease)    otc med prn   PONV (postoperative nausea and vomiting)    Raynaud's disease    Sjogren's disease ([place]) 02/04/2016   Positive ANA, Positive Ro, Positive CCP, Negative RF, Parotitis, Positive Sicca   Sjogren's syndrome ([place])    from MD note    Past Surgical History:  Procedure Laterality Date   BREAST SURGERY     augmentation   BUNIONECTOMY     right foot   COLONOSCOPY     DIAGNOSTIC LAPAROSCOPY     DILATION AND CURETTAGE OF UTERUS  Orozco/12/2011   Procedure: DILATATION AND CURETTAGE;  Surgeon: Marisa Orozco;  Location: [place] [facility];  Service: Gynecology;  Laterality: N/A;   IR RADIOLOGIST EVAL & MGMT  11/Orozco/2022   MUSCLE BIOPSY Left 01/02/2016   Procedure: MUSCLE BIOPSY LEFT DELTOID;  Surgeon: Marisa Confer, MD;  Location: [place];  Service: General;  Laterality: Left;  left deltoid muscle bx   NASAL SEPTUM SURGERY     SVD     x 2   TONSILLECTOMY     WISDOM TOOTH EXTRACTION      Social History   Socioeconomic History   Marital status: Married    Spouse name: Not on file   Number of children: Not on file   Years of education: Not on file   Highest education level: Not on file  Occupational History   Not on file  Tobacco Use   Smoking status: Former    Packs/day: Orozco.00    Years: 25.00    Pack years: 25.00    Types: Cigarettes    Quit date: 5/Orozco/2005    Years since quitting: 17.7   Smokeless  tobacco: Never  Vaping Use   Vaping Use: Never used  Substance and Sexual Activity   Alcohol use: Yes    Comment: socially - wine   Drug use: No   Sexual activity: Yes    Birth control/protection: None  Other Topics Concern   Not on file  Social History Narrative   Not on file   Social Determinants of Health   Financial Resource Strain: Not on file  Food Insecurity: Not on file  Transportation Needs: Not on file  Physical Activity: Not on file  Stress: Not on file  Social Connections: Not on file  Family History  Problem Relation Age of Onset   COPD Mother    Heart disease Mother    Diabetes Father    Kidney disease Father    Epilepsy Daughter    Rectal cancer Maternal Uncle    Esophageal cancer Neg Hx    Stomach cancer Neg Hx    Ulcerative colitis Neg Hx     Outpatient Encounter Medications as of Orozco/30/2023  Medication Sig   Acetaminophen (TYLENOL PO) Take by mouth as needed.   Cholecalciferol (VITAMIN D3) 1000 UNIT/SPRAY LIQD Take 5 sprays by mouth daily.    folic acid (FOLVITE) Orozco MG tablet Take 2 tablets (2 mg total) by mouth daily.   hydroxychloroquine (PLAQUENIL) 200 MG tablet Take Orozco tablet (200 mg total) by mouth 2 (two) times daily. (Patient taking differently: Take 200 mg by mouth 2 (two) times daily. Monday through Friday only)   methotrexate 50 MG/2ML injection INJECT 0.8ML INTO THE SKIN ONCE A WEEK   predniSONE (DELTASONE) Orozco MG tablet Take 3 mg tablet by mouth daily with breakfast.   triamcinolone ointment (KENALOG) 0.Orozco % SMARTSIG:Orozco Sparingly Topical Every Night   TUBERCULIN SYR 1CC/27GX1/2" 27G X Orozco/2" Orozco ML MISC Patient to use weekly to inject Methotrexate   VITAMIN K PO Take Orozco tablet by mouth daily.    No facility-administered encounter medications on file as of Orozco/30/2023.    ALLERGIES: Allergies  Allergen Reactions   Codeine Nausea And Vomiting   Flagyl [Metronidazole Hcl] Other (See Comments)    Reaction muscle weakness   Nexium [Esomeprazole  Magnesium] Other (See Comments)    Reaction Muscle weakness   Imuran [Azathioprine] Other (See Comments)    Joint Pain   Sulfa Antibiotics Rash   Sulfa Drugs Cross Reactors Rash   VACCINATION STATUS: Immunization History  Administered Date(s) Administered   Influenza, High Dose Seasonal PF 01/07/2018   Influenza-Unspecified 01/04/2014   PFIZER(Purple Top)SARS-COV-2 Vaccination 04/26/2019, 05/17/2019, 12/12/2019   Pneumococcal Conjugate-13 01/05/2016   Zoster Recombinat (Shingrix) 08/11/2017, 12/17/2017     HPI    Marisa Orozco  is a patient with the above medical history.  She is returning for follow-up for hypothyroidism.  During her last visit she was kept off of levothyroxine due to her intolerance to even lower dose levothyroxine.  She presents with thyroid function test consistent with euthyroid state.   She has no new complaints and except unable to lose weight.  Her labs also show prediabetes.   -Of note, she has Sjogren's syndrome which she is following with rheumatology for her. -She has generally fluctuating body weight, staying stable since last visit.   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    -  She remains on a tapering dose of prednisone given for rheumatology conditions including Sjogren's syndrome.  She is currently taking 3 mg p.o. daily.   She has osteoporosis on alendronate through her PMD.  She reports that her last DEXA was showing improvement compared to her DEXA in 2020.   She denies heat/cold intolerance.  She denies dysphagia, shortness of breath, no voice change.  She has family history of thyroid dysfunction of what appears to be hypothyroidism in her mother.  -She is also on methotrexate/Plaquenil in addition to on and off prednisone.   Pt denies feeling nodules in neck, No family history of thyroid cancer.  No history of  radiation therapy to head or neck. No recent use of iodine supplements.   ROS:  Limited as  above.  Physical Exam: BP 120/66    Pulse 88    Ht 5\' 6"  (Orozco.676 m)    Wt 173 lb 12.8 oz (78.8 kg)    BMI 28.05 kg/m  Wt Readings from Last 3 Encounters:  05/05/21 173 lb 12.8 oz (78.8 kg)  03/25/21 176 lb (79.8 kg)  01/15/21 175 lb (79.4 kg)    CMP ( most recent) CMP     Component Value Date/Time   NA 143 12/23/2020 0841   NA 137 01/16/2016 0000   K 3.8 12/23/2020 0841   CL 107 12/23/2020 0841   CO2 28 12/23/2020 0841   GLUCOSE 105 (H) 12/23/2020 0841   BUN 17 04/14/2021 0000   CREATININE 0.7 04/14/2021 0000   CREATININE 0.67 12/23/2020 0841   CALCIUM 9.3 04/14/2021 0000   PROT 6.8 12/23/2020 0841   ALBUMIN 4.2 10/12/2016 1202   AST 15 12/23/2020 0841   ALT 14 12/23/2020 0841   ALKPHOS 44 10/12/2016 1202   BILITOT 0.7 12/23/2020 0841   GFRNONAA 75 06/25/2020 0816   GFRAA 87 06/25/2020 0816    Lab Results  Component Value Date   TSH 3.20 04/14/2021   TSH 3.56 12/23/2020   TSH 2.99 08/20/2020   TSH 0.663 06/19/2020   TSH 2.63 12/20/2019   TSH 2.43 07/25/2019   TSH 4.89 03/25/2018   TSH 4.18 03/25/2018   TSH Orozco.16 09/01/2016   TSH 0.87 02/18/2016   FREET4 Orozco.Orozco 12/23/2020   FREET4 Orozco.Orozco 08/20/2020   FREET4 Orozco.43 06/19/2020   FREET4 Orozco.Orozco 12/20/2019   FREET4 Orozco.Orozco 07/25/2019       ASSESSMENT: Orozco. Hypothyroidism 2.  Prediabetes  PLAN:   -In light of her presentation with euthyroid state, she will be kept off of thyroid hormone supplement for now .  She will have repeat thyroid function test before her next visit in a year. - Due to absence of clinical goiter, no need for thyroid ultrasound.  She is advised to continue treatment for osteoporosis through her PMD.  More importantly, she is at risk of adrenal insufficiency if steroids are withdrawn suddenly.  Tapering off if possible is always advisable. Posture correcting strap for mechanical support of her spine is suggested.  Regarding her prediabetes: Had a long discussion with her about lifestyle medicine.  -  she acknowledges that there is a room for improvement in her food and drink choices. - Suggestion is made for her to avoid simple carbohydrates  from her diet including Cakes, Sweet Desserts, Ice Cream, Soda (diet and regular), Sweet Tea, Candies, Chips, Cookies, Store Bought Juices, Alcohol , Artificial Sweeteners,  Coffee Creamer, and "Sugar-free" Products, Lemonade. This will help patient to have more stable blood glucose profile and potentially avoid unintended weight gain.  The following Lifestyle Medicine recommendations according to [facility]) were discussed and and offered to patient and she  agrees to start the journey:  A. Whole Foods, Plant-Based Nutrition comprising of fruits and vegetables, plant-based proteins, whole-grain carbohydrates was discussed in detail with the patient.   A list for source of those nutrients were also provided to the patient.  Patient will use only water or unsweetened tea for hydration. B.  The need to stay away from risky substances including alcohol, smoking; obtaining 7 to 9 hours of restorative sleep, at least 150 minutes of moderate intensity exercise weekly, the importance of healthy social connections,  and stress management techniques were discussed. C.  A full color page of  Calorie density  of various food groups per pound showing examples of each food groups was provided to the patient.    She is advised to maintain close follow-up with her PMD, and her rheumatologist.   I spent 25 minutes in the care of the patient today including review of labs from Thyroid Function, CMP, and other relevant labs ; imaging/biopsy records (current and previous including abstractions from other facilities); face-to-face time discussing  her lab results and symptoms, medications doses, her options of short and long term treatment based on the latest standards of care / guidelines;   and documenting the encounter.  [name]   participated in the discussions, expressed understanding, and voiced agreement with the above plans.  All questions were answered to her satisfaction. she is encouraged to contact clinic should she have any questions or concerns prior to her return visit.   Return in about Orozco year (around Orozco/30/2024) for F/U with Pre-visit Labs.  Glade Lloyd, MD [facility] [address] [place], [place] [address] Phone: [phone]  Fax: [phone]   Orozco/30/2023, 12:22 PM  This note was partially dictated with voice recognition software. Similar sounding words can be transcribed inadequately or may not  be corrected upon review.

## 2021-05-26 ENCOUNTER — Encounter: Payer: Self-pay | Admitting: Rheumatology

## 2021-05-26 DIAGNOSIS — Z124 Encounter for screening for malignant neoplasm of cervix: Secondary | ICD-10-CM | POA: Diagnosis not present

## 2021-05-26 DIAGNOSIS — Z6827 Body mass index (BMI) 27.0-27.9, adult: Secondary | ICD-10-CM | POA: Diagnosis not present

## 2021-05-27 ENCOUNTER — Other Ambulatory Visit: Payer: Self-pay | Admitting: Obstetrics & Gynecology

## 2021-05-27 DIAGNOSIS — N882 Stricture and stenosis of cervix uteri: Secondary | ICD-10-CM

## 2021-05-30 ENCOUNTER — Encounter: Payer: Self-pay | Admitting: Cardiology

## 2021-05-30 ENCOUNTER — Telehealth: Payer: Self-pay | Admitting: Cardiology

## 2021-05-30 NOTE — Telephone Encounter (Signed)
STAT if HR is under 50 or over 120 (normal HR is 60-100 beats per minute)  What is your heart rate? 77  Do you have a log of your heart rate readings (document readings)? no  Do you have any other symptoms? Anxious feeling  Patient c/o Palpitations:  High priority if patient c/o lightheadedness, shortness of breath, or chest pain  How long have you had palpitations/irregular HR/ Afib? Are you having the symptoms now? yes  Are you currently experiencing lightheadedness, SOB or CP? no  Do you have a history of afib (atrial fibrillation) or irregular heart rhythm? no  Have you checked your BP or HR? (document readings if available): hr 77   Are you experiencing any other symptoms? no

## 2021-05-30 NOTE — Telephone Encounter (Signed)
Spoke with the patient who reports that she has been having palpitations for several weeks now. She states that she has been feeling her heart skip beats. She states that last night her watch notified her that she was in A fib. She states she got two readings that stated Afib and heart rate was 104. All others have given inconclusive readings but have been irregular. She is going to see if she can send over the readings to [place] in a MyChart message for review.

## 2021-06-02 NOTE — Telephone Encounter (Signed)
PT HAS BEEN SCHEDULED WITH DR Marlou Porch 06/10/21.

## 2021-06-03 DIAGNOSIS — E7800 Pure hypercholesterolemia, unspecified: Secondary | ICD-10-CM | POA: Diagnosis not present

## 2021-06-03 DIAGNOSIS — I10 Essential (primary) hypertension: Secondary | ICD-10-CM | POA: Diagnosis not present

## 2021-06-03 DIAGNOSIS — E785 Hyperlipidemia, unspecified: Secondary | ICD-10-CM | POA: Diagnosis not present

## 2021-06-03 NOTE — Telephone Encounter (Signed)
Moved up appt to 06/09/21. Reassured the patient that the strips she sent in are not dangerous. They are worrisome for her but are not harmful. Verbalized understanding with read back of appt date and time.

## 2021-06-06 ENCOUNTER — Ambulatory Visit
Admission: RE | Admit: 2021-06-06 | Discharge: 2021-06-06 | Disposition: A | Discharge status: Home / Self Care | Payer: Medicare Other | Source: Ambulatory Visit | Attending: Obstetrics & Gynecology | Admitting: Obstetrics & Gynecology

## 2021-06-06 ENCOUNTER — Other Ambulatory Visit: Payer: Self-pay | Admitting: Physician Assistant

## 2021-06-06 DIAGNOSIS — N882 Stricture and stenosis of cervix uteri: Secondary | ICD-10-CM

## 2021-06-06 DIAGNOSIS — Z780 Asymptomatic menopausal state: Secondary | ICD-10-CM | POA: Diagnosis not present

## 2021-06-06 DIAGNOSIS — N950 Postmenopausal bleeding: Secondary | ICD-10-CM | POA: Diagnosis not present

## 2021-06-06 NOTE — Telephone Encounter (Signed)
Next Visit: 06/27/2021 ? ?Last Visit: 03/25/2021 ? ?Last Fill: 03/24/2021 ? ?DX: Dermatomyositis ? ?Current Dose per office note 03/25/2021: MTX 0.8 ml every 7 days ? ?Labs: 04/14/2021 CBC WNL.  Creatinine WNL-0.72 and GFR WNL-90.  LFTs WNL.  CK WNL-74.   ? ?Okay to refill MTX?  ?

## 2021-06-09 ENCOUNTER — Ambulatory Visit (INDEPENDENT_AMBULATORY_CARE_PROVIDER_SITE_OTHER): Payer: Medicare Other | Admitting: Cardiology

## 2021-06-09 ENCOUNTER — Other Ambulatory Visit: Payer: Self-pay

## 2021-06-09 ENCOUNTER — Encounter: Payer: Self-pay | Admitting: Cardiology

## 2021-06-09 VITALS — BP 124/60 | HR 79 | Ht 66.0 in | Wt 172.0 lb

## 2021-06-09 DIAGNOSIS — R002 Palpitations: Secondary | ICD-10-CM | POA: Diagnosis not present

## 2021-06-09 NOTE — Progress Notes (Signed)
Cardiology Office Note:    Date:  06/09/2021   ID:  Marisa Orozco, DOB February 26, 1951, MRN [id number]  PCP:  Caryl Bis, MD   [facility] Providers Cardiologist:  Candee Furbish, MD     Referring MD: Caryl Bis, MD    History of Present Illness:    Marisa Orozco is a 71 y.o. female here for the evaluation of rapid heart rate.  3 separate Samsung health monitor EKGs were transmitted via PDF.  The third was labeled as inconclusive.  It demonstrated sinus rhythm with 3 separate PVCs noted throughout the tracings.  1 other tracing 2/23 at 10:30 PM was labeled atrial fibrillation however this was sinus rhythm with an isolated PVC upon inspection.  Please see encounters, patient messages from 05/26/2021.  She followed up with me previously on 01/15/2021 with palpitations. 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  Previously history of tachycardia palpitations felt to be an inflammatory response to a dermatomyositis flare.  Back in 2017 her echocardiogram was normal with grade 1 diastolic dysfunction and EF of 70%.  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  Followed by Dr. Roxan Hockey.  Did not want it resected.  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   Jo 1 Positive  Skin Bx- Subtle interface Dermatitis    Former smoker 02/04/2016   GERD (gastroesophageal reflux disease)    otc med prn   PONV (postoperative nausea and vomiting)    Raynaud's disease    Sjogren's disease ([place]) 02/04/2016   Positive ANA, Positive Ro, Positive CCP, Negative RF, Parotitis, Positive Sicca   Sjogren's syndrome ([place])    from MD note    Past Surgical History:  Procedure Laterality Date   BREAST SURGERY     augmentation   BUNIONECTOMY     right foot   COLONOSCOPY     DIAGNOSTIC LAPAROSCOPY     DILATION AND  CURETTAGE OF UTERUS  04/15/2011   Procedure: DILATATION AND CURETTAGE;  Surgeon: [name];  Location: [place] [facility];  Service: Gynecology;  Laterality: N/A;   IR RADIOLOGIST EVAL & MGMT  02/04/2021   MUSCLE BIOPSY Left 01/02/2016   Procedure: MUSCLE BIOPSY LEFT DELTOID;  Surgeon: [name], MD;  Location: [place];  Service: General;  Laterality: Left;  left deltoid muscle bx   NASAL SEPTUM SURGERY     SVD     x 2   TONSILLECTOMY     WISDOM TOOTH EXTRACTION      Current Medications: Current Meds  Medication Sig   Acetaminophen (TYLENOL PO) Take by mouth as needed.   alendronate (FOSAMAX) 70 MG tablet Take 70 mg by mouth once a week.   Cholecalciferol (VITAMIN D3) 1000 UNIT/SPRAY LIQD Take 5 sprays by mouth daily.    Coenzyme Q10 (COQ-10) 100 MG CAPS SMARTSIG:1 By Mouth   ESTRACE VAGINAL 0.1 MG/GM vaginal cream    folic acid (FOLVITE) 1 MG tablet Take 2 tablets (2 mg total) by mouth daily.   hydroxychloroquine (PLAQUENIL) 200 MG tablet Take 1 tablet (200 mg total) by mouth 2 (two) times daily. (Patient taking differently: Take 200 mg by mouth 2 (two) times daily. Monday through Friday only)   methotrexate 50 MG/2ML injection INJECT 0.8ML INTO THE SKIN ONCE A WEEK   predniSONE (  DELTASONE) 1 MG tablet Take 3 mg tablet by mouth daily with breakfast.   triamcinolone ointment (KENALOG) 0.1 % SMARTSIG:1 Sparingly Topical Every Night   TUBERCULIN SYR 1CC/27GX1/2" 27G X 1/2" 1 ML MISC Patient to use weekly to inject Methotrexate   VITAMIN K PO Take 1 tablet by mouth daily.      Allergies:   Codeine, Flagyl [metronidazole hcl], Nexium [esomeprazole magnesium], Imuran [azathioprine], Sulfa antibiotics, and Sulfa drugs cross reactors   Social History   Socioeconomic History   Marital status: Married    Spouse name: Not on file   Number of children: Not on file   Years of education: Not on file   Highest education level: Not on file  Occupational History   Not on file   Tobacco Use   Smoking status: Former    Packs/day: 1.00    Years: 25.00    Pack years: 25.00    Types: Cigarettes    Quit date: 08/05/2003    Years since quitting: 17.8   Smokeless tobacco: Never  Vaping Use   Vaping Use: Never used  Substance and Sexual Activity   Alcohol use: Yes    Comment: socially - wine   Drug use: No   Sexual activity: Yes    Birth control/protection: None  Other Topics Concern   Not on file  Social History Narrative   Not on file   Social Determinants of Health   Financial Resource Strain: Not on file  Food Insecurity: Not on file  Transportation Needs: Not on file  Physical Activity: Not on file  Stress: Not on file  Social Connections: Not on file     Family History: The patient's family history includes COPD in her mother; Diabetes in her father; Epilepsy in her daughter; Heart disease in her mother; Kidney disease in her father; Rectal cancer in her maternal uncle. There is no history of Esophageal cancer, Stomach cancer, or Ulcerative colitis.  ROS:   Please see the history of present illness.     All other systems reviewed and are negative.  EKGs/Labs/Other Studies Reviewed:     EKG:  EKG is  ordered today.  The ekg ordered today demonstrates sinus rhythm 79 no other changes.  Recent Labs: 12/23/2020: ALT 14; Hemoglobin 14.6; Platelets 192; Potassium 3.8; Sodium 143 04/14/2021: BUN 17; Creatinine 0.7; TSH 3.20  Recent Lipid Panel    Component Value Date/Time   CHOL 157 04/14/2021 0000   TRIG 86 04/14/2021 0000   HDL 43 04/14/2021 0000   LDLCALC 98 04/14/2021 0000     Risk Assessment/Calculations:              Physical Exam:    VS:  BP 124/60 (BP Location: Left Arm, Patient Position: Sitting, Cuff Size: Large)    Pulse 79    Ht 5\' 6"  (1.676 m)    Wt 172 lb (78 kg)    BMI 27.76 kg/m     Wt Readings from Last 3 Encounters:  06/09/21 172 lb (78 kg)  05/05/21 173 lb 12.8 oz (78.8 kg)  03/25/21 176 lb (79.8 kg)     GEN:   Well nourished, well developed in no acute distress HEENT: Normal NECK: No JVD; No carotid bruits LYMPHATICS: No lymphadenopathy CARDIAC: RRR, no murmurs, no rubs, gallops RESPIRATORY:  Clear to auscultation without rales, wheezing or rhonchi  ABDOMEN: Soft, non-tender, non-distended MUSCULOSKELETAL:  No edema; No deformity  SKIN: Warm and dry NEUROLOGIC:  Alert and oriented x 3 PSYCHIATRIC:  Normal affect   ASSESSMENT:    1. Palpitations    PLAN:    In order of problems listed above:  Palpitations PVC's noted on Samsung watch.  When tracing was labeled as atrial fibrillation but this was sinus rhythm.  I showed her the P waves and how the R to R interval's are normal.  Reassurance.  Conservative treatment strategy.  PVCs can be brought about by lack of sleep, caffeine etc.  Spoke about medications but does not wish to go that route.  I agree.   35 minutes spent in review of messages, transmitted tracings, discussion with patient, documentation       Medication Adjustments/Labs and Tests Ordered: Current medicines are reviewed at length with the patient today.  Concerns regarding medicines are outlined above.  Orders Placed This Encounter  Procedures   EKG 12-Lead   No orders of the defined types were placed in this encounter.   Patient Instructions  Medication Instructions:  The current medical regimen is effective;  continue present plan and medications.  *If you need a refill on your cardiac medications before your next appointment, please call your pharmacy*  Follow-Up: At [facility], you and your health needs are our priority.  As part of our continuing mission to provide you with exceptional heart care, we have created designated Provider Care Teams.  These Care Teams include your primary Cardiologist (physician) and Advanced Practice Providers (APPs -  Physician Assistants and Nurse Practitioners) who all work together to provide you with the care you need,  when you need it.  We recommend signing up for the patient portal called "MyChart".  Sign up information is provided on this After Visit Summary.  MyChart is used to connect with patients for Virtual Visits (Telemedicine).  Patients are able to view lab/test results, encounter notes, upcoming appointments, etc.  Non-urgent messages can be sent to your provider as well.   To learn more about what you can do with MyChart, go to [email or web address].    Your next appointment:   As previously scheduled  Thank you for choosing [facility]!!      Signed, Candee Furbish, MD  06/09/2021 4:06 PM    [facility]

## 2021-06-09 NOTE — Assessment & Plan Note (Signed)
PVC's noted on Samsung watch.  When tracing was labeled as atrial fibrillation but this was sinus rhythm.  I showed her the P waves and how the R to R interval's are normal.  Reassurance.  Conservative treatment strategy.  PVCs can be brought about by lack of sleep, caffeine etc.  Spoke about medications but does not wish to go that route.  I agree. ?

## 2021-06-09 NOTE — Patient Instructions (Signed)
Medication Instructions:  ?The current medical regimen is effective;  continue present plan and medications. ? ?*If you need a refill on your cardiac medications before your next appointment, please call your pharmacy* ? ?Follow-Up: ?At [facility], you and your health needs are our priority.  As part of our continuing mission to provide you with exceptional heart care, we have created designated Provider Care Teams.  These Care Teams include your primary Cardiologist (physician) and Advanced Practice Providers (APPs -  Physician Assistants and Nurse Practitioners) who all work together to provide you with the care you need, when you need it. ? ?We recommend signing up for the patient portal called "MyChart".  Sign up information is provided on this After Visit Summary.  MyChart is used to connect with patients for Virtual Visits (Telemedicine).  Patients are able to view lab/test results, encounter notes, upcoming appointments, etc.  Non-urgent messages can be sent to your provider as well.   ?To learn more about what you can do with MyChart, go to [email or web address].   ? ?Your next appointment:   ?As previously scheduled ? ?Thank you for choosing [place]!! ? ? ? ?

## 2021-06-10 ENCOUNTER — Ambulatory Visit: Payer: Medicare Other | Admitting: Cardiology

## 2021-06-13 NOTE — Progress Notes (Unsigned)
Office Visit Note  Patient: Marisa Orozco             Date of Birth: 1950/08/23           MRN: [id number]             PCP: Caryl Bis, MD Referring: Caryl Bis, MD Visit Date: 06/27/2021 Occupation: @GUAROCC @  Subjective:  No chief complaint on file.   History of Present Illness: Marisa Orozco is a 71 y.o. female ***   Activities of Daily Living:  Patient reports morning stiffness for *** {minute/hour:19697}.   Patient {ACTIONS;DENIES/REPORTS:21021675::"Denies"} nocturnal pain.  Difficulty dressing/grooming: {ACTIONS;DENIES/REPORTS:21021675::"Denies"} Difficulty climbing stairs: {ACTIONS;DENIES/REPORTS:21021675::"Denies"} Difficulty getting out of chair: {ACTIONS;DENIES/REPORTS:21021675::"Denies"} Difficulty using hands for taps, buttons, cutlery, and/or writing: {ACTIONS;DENIES/REPORTS:21021675::"Denies"}  No Rheumatology ROS completed.   PMFS History:  Patient Active Problem List   Diagnosis Date Noted   Palpitations 01/15/2021   Hypothyroidism 07/26/2019   Primary osteoarthritis of right knee 02/03/2018   Primary osteoarthritis of both hands 09/08/2017   Sjogren's disease ([place]) 02/04/2016   Dermatomyositis ([place]) 02/04/2016   Former smoker 02/04/2016   Thymoma 02/04/2016   Fatigue 02/15/2012    Past Medical History:  Diagnosis Date   Allergy    Anxiety    Cancer ([place])    basal cell carcinoma - face   Dermatomyositis ([place]) 02/04/2016   Jo 1 Positive  Skin Bx- Subtle interface Dermatitis    Former smoker 02/04/2016   GERD (gastroesophageal reflux disease)    otc med prn   PONV (postoperative nausea and vomiting)    Raynaud's disease    Sjogren's disease ([place]) 02/04/2016   Positive ANA, Positive Ro, Positive CCP, Negative RF, Parotitis, Positive Sicca   Sjogren's syndrome ([place])    from MD note    Family History  Problem Relation Age of Onset   COPD Mother    Heart disease Mother    Diabetes Father    Kidney disease Father    Epilepsy  Daughter    Rectal cancer Maternal Uncle    Esophageal cancer Neg Hx    Stomach cancer Neg Hx    Ulcerative colitis Neg Hx    Past Surgical History:  Procedure Laterality Date   BREAST SURGERY     augmentation   BUNIONECTOMY     right foot   COLONOSCOPY     DIAGNOSTIC LAPAROSCOPY     DILATION AND CURETTAGE OF UTERUS  04/15/2011   Procedure: DILATATION AND CURETTAGE;  Surgeon: Marisa Orozco;  Location: [facility];  Service: Gynecology;  Laterality: N/A;   IR RADIOLOGIST EVAL & MGMT  02/04/2021   MUSCLE BIOPSY Left 01/02/2016   Procedure: MUSCLE BIOPSY LEFT DELTOID;  Surgeon: Marisa Confer, MD;  Location: [facility];  Service: General;  Laterality: Left;  left deltoid muscle bx   NASAL SEPTUM SURGERY     SVD     x 2   TONSILLECTOMY     WISDOM TOOTH EXTRACTION     Social History   Social History Narrative   Not on file   Immunization History  Administered Date(s) Administered   Influenza, High Dose Seasonal PF 01/07/2018   Influenza-Unspecified 01/04/2014   PFIZER(Purple Top)SARS-COV-2 Vaccination 04/26/2019, 05/17/2019, 12/12/2019   Pneumococcal Conjugate-13 01/05/2016   Zoster Recombinat (Shingrix) 08/11/2017, 12/17/2017     Objective: Vital Signs: There were no vitals taken for this visit.   Physical Exam   Musculoskeletal Exam: ***  CDAI Exam: CDAI Score: -- Patient Global: --; Provider Global: --  Swollen: --; Tender: -- Joint Exam 06/27/2021   No joint exam has been documented for this visit   There is currently no information documented on the homunculus. Go to the Rheumatology activity and complete the homunculus joint exam.  Investigation: No additional findings.  Imaging: US PELVIC COMPLETE WITH TRANSVAGINAL  Result Date: 06/06/2021 CLINICAL DATA:  Stricture and stenosis of cervix uteri, postmenopausal EXAM: TRANSABDOMINAL AND TRANSVAGINAL ULTRASOUND OF PELVIS TECHNIQUE: Both transabdominal and transvaginal ultrasound examinations of the  pelvis were performed. Transabdominal technique was performed for global imaging of the pelvis including uterus, ovaries, adnexal regions, and pelvic cul-de-sac. It was necessary to proceed with endovaginal exam following the transabdominal exam to visualize the uterus, endometrium, and ovaries. COMPARISON:  10/11/2020 FINDINGS: Uterus Measurements: 6.7 x 3.0 x 3.9 cm = volume: 41 mL. Anteverted. Atrophic. No focal mass. Endometrium Thickness: 2 mm. Complex fluid within endometrial canal and endocervical canal consistent with history of cervical stenosis. Scattered echogenic foci/debris within fluid. No discrete mass. Right ovary Measurements: 2.1 x 1.4 x 2.1 cm = volume: 3.3 mL. Tiny follicle 8 mm diameter; no follow-up imaging recommended Left ovary Measurements: 2.7 x 1.8 x 1.1 cm = volume: 2.8 mL. Normal morphology without mass Other findings No free pelvic fluid.  No adnexal masses. IMPRESSION: Fluid collection again identified within the endocervical canal and endometrial canal consistent with cervical stenosis. No discrete endocervical or endometrial mass identified. Remainder of exam unremarkable. Electronically Signed   By: Marisa Orozco M.D.   On: 06/06/2021 16:23    Recent Labs: Lab Results  Component Value Date   WBC 7.0 12/23/2020   HGB 14.6 12/23/2020   PLT 192 12/23/2020   NA 143 12/23/2020   K 3.8 12/23/2020   CL 107 12/23/2020   CO2 28 12/23/2020   GLUCOSE 105 (H) 12/23/2020   BUN 17 04/14/2021   CREATININE 0.7 04/14/2021   BILITOT 0.7 12/23/2020   ALKPHOS 44 10/12/2016   AST 15 12/23/2020   ALT 14 12/23/2020   PROT 6.8 12/23/2020   ALBUMIN 4.2 10/12/2016   CALCIUM 9.3 04/14/2021   GFRAA 87 06/25/2020   QFTBGOLDPLUS NEGATIVE 07/07/2017    Speciality Comments: PLQ Eye Exam: 10/14/2020 WNL @ [facility]. Follow up 1 year.  Procedures:  No procedures performed Allergies: Codeine, Flagyl [metronidazole hcl], Nexium [esomeprazole magnesium], Imuran [azathioprine],  Sulfa antibiotics, and Sulfa drugs cross reactors   Assessment / Plan:     Visit Diagnoses: Dermatomyositis ([place])  Sjogren's syndrome with keratoconjunctivitis sicca ([place])  High risk medication use  Long term (current) use of systemic steroids  Primary osteoarthritis of both hands  Primary osteoarthritis of both knees  Age-related osteoporosis without current pathological fracture  History of vitamin D deficiency  Thymoma  Hair loss  Former smoker  COVID-19 virus infection  Orders: No orders of the defined types were placed in this encounter.  No orders of the defined types were placed in this encounter.   Face-to-face time spent with patient was *** minutes. Greater than 50% of time was spent in counseling and coordination of care.  Follow-Up Instructions: No follow-ups on file.   Ofilia Neas, PA-C  Note - This record has been created using Dragon software.  Chart creation errors have been sought, but may not always  have been located. Such creation errors do not reflect on  the standard of medical care.

## 2021-06-23 ENCOUNTER — Other Ambulatory Visit: Payer: Self-pay | Admitting: *Deleted

## 2021-06-23 DIAGNOSIS — M3501 Sicca syndrome with keratoconjunctivitis: Secondary | ICD-10-CM

## 2021-06-23 MED ORDER — HYDROXYCHLOROQUINE SULFATE 200 MG PO TABS: 200.0000 mg | ORAL_TABLET | Freq: Two times a day (BID) | ORAL | 0 refills | Status: DC

## 2021-06-23 NOTE — Telephone Encounter (Signed)
Next Visit: 06/27/2021 ?  ?Last Visit: 03/25/2021 ?  ?Last Fill: 03/03/2021 ? ?DX: Dermatomyositis ?  ?Current Dose per office note 03/25/2021: Plaquenil 200 mg 1 tablet QD ? ?Labs: 04/14/2021 CBC WNL.  Creatinine WNL-0.72 and GFR WNL-90.  LFTs WNL.  CK WNL-74.   ? ?PLQ Eye Exam: 10/14/2020 WNL ? ?Okay to refill PLQ?  ?

## 2021-06-27 ENCOUNTER — Encounter: Payer: Self-pay | Admitting: Rheumatology

## 2021-06-27 ENCOUNTER — Ambulatory Visit (INDEPENDENT_AMBULATORY_CARE_PROVIDER_SITE_OTHER): Payer: Medicare Other | Admitting: Rheumatology

## 2021-06-27 ENCOUNTER — Other Ambulatory Visit: Payer: Self-pay

## 2021-06-27 VITALS — BP 122/75 | HR 78 | Ht 66.0 in | Wt 176.2 lb

## 2021-06-27 DIAGNOSIS — L659 Nonscarring hair loss, unspecified: Secondary | ICD-10-CM

## 2021-06-27 DIAGNOSIS — M3390 Dermatopolymyositis, unspecified, organ involvement unspecified: Secondary | ICD-10-CM | POA: Diagnosis not present

## 2021-06-27 DIAGNOSIS — M810 Age-related osteoporosis without current pathological fracture: Secondary | ICD-10-CM | POA: Diagnosis not present

## 2021-06-27 DIAGNOSIS — Z79899 Other long term (current) drug therapy: Secondary | ICD-10-CM | POA: Diagnosis not present

## 2021-06-27 DIAGNOSIS — M3313 Other dermatomyositis without myopathy: Secondary | ICD-10-CM

## 2021-06-27 DIAGNOSIS — Z87891 Personal history of nicotine dependence: Secondary | ICD-10-CM

## 2021-06-27 DIAGNOSIS — M19041 Primary osteoarthritis, right hand: Secondary | ICD-10-CM | POA: Diagnosis not present

## 2021-06-27 DIAGNOSIS — Z7952 Long term (current) use of systemic steroids: Secondary | ICD-10-CM

## 2021-06-27 DIAGNOSIS — M19042 Primary osteoarthritis, left hand: Secondary | ICD-10-CM | POA: Diagnosis not present

## 2021-06-27 DIAGNOSIS — M3501 Sicca syndrome with keratoconjunctivitis: Secondary | ICD-10-CM

## 2021-06-27 DIAGNOSIS — U071 COVID-19: Secondary | ICD-10-CM

## 2021-06-27 DIAGNOSIS — D4989 Neoplasm of unspecified behavior of other specified sites: Secondary | ICD-10-CM

## 2021-06-27 DIAGNOSIS — M170 Bilateral primary osteoarthritis of knee: Secondary | ICD-10-CM | POA: Diagnosis not present

## 2021-06-27 DIAGNOSIS — Z8639 Personal history of other endocrine, nutritional and metabolic disease: Secondary | ICD-10-CM | POA: Diagnosis not present

## 2021-06-27 NOTE — Patient Instructions (Signed)
Standing Labs ?We placed an order today for your standing lab work.  ? ?Please have your standing labs drawn in April ?And every 3 months ? ?If possible, please have your labs drawn 2 weeks prior to your appointment so that the provider can discuss your results at your appointment. ? ?Please note that you may see your imaging and lab results in [facility] before we have reviewed them. ?We may be awaiting multiple results to interpret others before contacting you. ?Please allow our office up to 72 hours to thoroughly review all of the results before contacting the office for clarification of your results. ? ?We have open lab daily: ?Monday through Thursday from 1:30-4:30 PM and Friday from 1:30-4:00 PM ?at the office of Dr. Bo Merino, [facility].   ?Please be advised, all patients with office appointments requiring lab work will take precedent over walk-in lab work.  ?If possible, please come for your lab work on Monday and Friday afternoons, as you may experience shorter wait times. ?The office is located at [address], [place], [place], [place] [address] ?No appointment is necessary.   ?Labs are drawn by [facility]. Please bring your co-pay at the time of your lab draw.  You may receive a bill from [facility] for your lab work. ? ?Please note if you are on Hydroxychloroquine and and an order has been placed for a Hydroxychloroquine level, you will need to have it drawn 4 hours or more after your last dose. ? ?If you wish to have your labs drawn at another location, please call the office 24 hours in advance to send orders. ? ?If you have any questions regarding directions or hours of operation,  ?please call [phone].   ?As a reminder, please drink plenty of water prior to coming for your lab work. Thanks!  ? ?Vaccines ?You are taking a medication(s) that can suppress your immune system.  The following immunizations are recommended: ?Flu annually ?Covid-19  ?Td/Tdap (tetanus, diphtheria,  pertussis) every 10 years ?Pneumonia (Prevnar 15 then Pneumovax 23 at least 1 year apart.  Alternatively, can take Prevnar 20 without needing additional dose) ?Shingrix: 2 doses from 4 weeks to 6 months apart ? ?Please check with your PCP to make sure you are up to date.  ? ?If you have signs or symptoms of an infection or start antibiotics: ?First, call your PCP for workup of your infection. ?Hold your medication through the infection, until you complete your antibiotics, and until symptoms resolve if you take the following: ?Injectable medication (Actemra, Benlysta, Cimzia, Cosentyx, Enbrel, Humira, Kevzara, Orencia, Remicade, Simponi, [place], [place], [place]) ?Methotrexate ?Leflunomide Jolee Ewing) ?Mycophenolate (Cellcept) ?Roma Kayser, or Rinvoq  ?

## 2021-06-28 DIAGNOSIS — Z20822 Contact with and (suspected) exposure to covid-19: Secondary | ICD-10-CM | POA: Diagnosis not present

## 2021-06-29 ENCOUNTER — Other Ambulatory Visit: Payer: Self-pay | Admitting: Rheumatology

## 2021-06-29 NOTE — Telephone Encounter (Signed)
06/04/20 DEXA scan showed T score -2.2 and lumbar spine consistent with osteopenia.  Please scan it under imaging.

## 2021-06-30 NOTE — Telephone Encounter (Signed)
DEXA scan is under Care everywhere.  ?

## 2021-07-04 DIAGNOSIS — Z1231 Encounter for screening mammogram for malignant neoplasm of breast: Secondary | ICD-10-CM | POA: Diagnosis not present

## 2021-07-23 ENCOUNTER — Ambulatory Visit (INDEPENDENT_AMBULATORY_CARE_PROVIDER_SITE_OTHER): Payer: Medicare Other

## 2021-07-23 ENCOUNTER — Encounter: Payer: Self-pay | Admitting: Rheumatology

## 2021-07-23 ENCOUNTER — Ambulatory Visit (INDEPENDENT_AMBULATORY_CARE_PROVIDER_SITE_OTHER): Payer: Medicare Other | Admitting: Rheumatology

## 2021-07-23 VITALS — BP 142/93 | HR 76 | Resp 12 | Ht 66.0 in | Wt 171.0 lb

## 2021-07-23 DIAGNOSIS — M810 Age-related osteoporosis without current pathological fracture: Secondary | ICD-10-CM | POA: Diagnosis not present

## 2021-07-23 DIAGNOSIS — D4989 Neoplasm of unspecified behavior of other specified sites: Secondary | ICD-10-CM | POA: Diagnosis not present

## 2021-07-23 DIAGNOSIS — M3390 Dermatopolymyositis, unspecified, organ involvement unspecified: Secondary | ICD-10-CM | POA: Diagnosis not present

## 2021-07-23 DIAGNOSIS — Z7952 Long term (current) use of systemic steroids: Secondary | ICD-10-CM

## 2021-07-23 DIAGNOSIS — L659 Nonscarring hair loss, unspecified: Secondary | ICD-10-CM | POA: Diagnosis not present

## 2021-07-23 DIAGNOSIS — M19041 Primary osteoarthritis, right hand: Secondary | ICD-10-CM

## 2021-07-23 DIAGNOSIS — M3501 Sicca syndrome with keratoconjunctivitis: Secondary | ICD-10-CM

## 2021-07-23 DIAGNOSIS — M25511 Pain in right shoulder: Secondary | ICD-10-CM

## 2021-07-23 DIAGNOSIS — Z87891 Personal history of nicotine dependence: Secondary | ICD-10-CM

## 2021-07-23 DIAGNOSIS — M170 Bilateral primary osteoarthritis of knee: Secondary | ICD-10-CM

## 2021-07-23 DIAGNOSIS — Z79899 Other long term (current) drug therapy: Secondary | ICD-10-CM

## 2021-07-23 DIAGNOSIS — Z8639 Personal history of other endocrine, nutritional and metabolic disease: Secondary | ICD-10-CM | POA: Diagnosis not present

## 2021-07-23 DIAGNOSIS — M19042 Primary osteoarthritis, left hand: Secondary | ICD-10-CM

## 2021-07-23 MED ORDER — TRIAMCINOLONE ACETONIDE 40 MG/ML IJ SUSP
40.0000 mg | INTRAMUSCULAR | Status: AC | PRN
  Administered 2021-07-23: 40 mg via INTRA_ARTICULAR

## 2021-07-23 MED ORDER — LIDOCAINE HCL 1 % IJ SOLN
1.0000 mL | INTRAMUSCULAR | Status: AC | PRN
  Administered 2021-07-23: 1 mL

## 2021-07-23 NOTE — Progress Notes (Signed)
? ?Office Visit Note ? ?Patient: Marisa Orozco             ?Date of Birth: July 06, 1950           ?MRN: [id number]             ?PCP: Caryl Bis, MD ?Referring: Caryl Bis, MD ?Visit Date: 07/23/2021 ?Occupation: @GUAROCC @ ? ?Subjective:  ?Right shoulder pain ? ?History of Present Illness: Marisa Orozco is a 71 y.o. female with history of dermatomyositis Sjogren's and osteoarthritis.  She states she started developing some discomfort in her right shoulder on last Monday.  By the next day she started having difficulty raising her right arm without discomfort.  She states the pain has gradually getting worse.  She feels pain when she touches on her shoulder.  She denies any muscular weakness or tenderness.  She continues to have dry mouth and dry eyes. ? ?Activities of Daily Living:  ?Patient reports morning stiffness for 0 minutes.   ?Patient Reports nocturnal pain.  ?Difficulty dressing/grooming: Reports ?Difficulty climbing stairs: Reports ?Difficulty getting out of chair: Denies ?Difficulty using hands for taps, buttons, cutlery, and/or writing: Denies ? ?Review of Systems  ?Constitutional:  Negative for fatigue.  ?HENT:  Positive for mouth dryness and nose dryness. Negative for mouth sores.   ?Eyes:  Positive for dryness. Negative for pain and itching.  ?Respiratory:  Negative for shortness of breath and difficulty breathing.   ?Cardiovascular:  Negative for chest pain and palpitations.  ?Gastrointestinal:  Negative for blood in stool, constipation and diarrhea.  ?Endocrine: Negative for increased urination.  ?Genitourinary:  Negative for difficulty urinating.  ?Musculoskeletal:  Positive for myalgias, muscle weakness, morning stiffness, muscle tenderness and myalgias. Negative for joint pain, joint pain and joint swelling.  ?Skin:  Positive for color change. Negative for rash, redness and sensitivity to sunlight.  ?Allergic/Immunologic: Negative for susceptible to infections.  ?Neurological:  Positive  for numbness. Negative for dizziness, headaches and memory loss.  ?Hematological:  Positive for bruising/bleeding tendency.  ?Psychiatric/Behavioral:  Negative for confusion.   ? ?PMFS History:  ?Patient Active Problem List  ? Diagnosis Date Noted  ? Palpitations 01/15/2021  ? Hypothyroidism 07/26/2019  ? Primary osteoarthritis of right knee 02/03/2018  ? Primary osteoarthritis of both hands 09/08/2017  ? Sjogren's disease ([place]) 02/04/2016  ? Dermatomyositis ([place]) 02/04/2016  ? Former smoker 02/04/2016  ? Thymoma 02/04/2016  ? Fatigue 02/15/2012  ?  ?Past Medical History:  ?Diagnosis Date  ? Allergy   ? Anxiety   ? Cancer [facility])   ? basal cell carcinoma - face  ? Dermatomyositis ([place]) 02/04/2016  ? [name] 1 Positive  Skin Bx- Subtle interface Dermatitis   ? Former smoker 02/04/2016  ? GERD (gastroesophageal reflux disease)   ? otc med prn  ? PONV (postoperative nausea and vomiting)   ? Raynaud's disease   ? Sjogren's disease ([place]) 02/04/2016  ? Positive ANA, Positive Ro, Positive CCP, Negative RF, Parotitis, Positive Sicca  ? Sjogren's syndrome ([place])   ? from MD note  ?  ?Family History  ?Problem Relation Age of Onset  ? COPD Mother   ? Heart disease Mother   ? Diabetes Father   ? Kidney disease Father   ? Epilepsy Daughter   ? Rectal cancer Maternal Uncle   ? Esophageal cancer Neg Hx   ? Stomach cancer Neg Hx   ? Ulcerative colitis Neg Hx   ? ?Past Surgical History:  ?Procedure Laterality Date  ? BREAST  SURGERY    ? augmentation  ? BUNIONECTOMY    ? right foot  ? COLONOSCOPY    ? DIAGNOSTIC LAPAROSCOPY    ? DILATION AND CURETTAGE OF UTERUS  04/15/2011  ? Procedure: DILATATION AND CURETTAGE;  Surgeon: [name];  Location: [facility];  Service: Gynecology;  Laterality: N/A;  ? IR RADIOLOGIST EVAL & MGMT  02/04/2021  ? MUSCLE BIOPSY Left 01/02/2016  ? Procedure: MUSCLE BIOPSY LEFT DELTOID;  Surgeon: [name], MD;  Location: [facility];  Service: General;  Laterality: Left;  left deltoid muscle bx  ?  NASAL SEPTUM SURGERY    ? SVD    ? x 2  ? TONSILLECTOMY    ? WISDOM TOOTH EXTRACTION    ? ?Social History  ? ?Social History Narrative  ? Not on file  ? ?Immunization History  ?Administered Date(s) Administered  ? Influenza, High Dose Seasonal PF 01/07/2018  ? Influenza-Unspecified 01/04/2014  ? PFIZER(Purple Top)SARS-COV-2 Vaccination 04/26/2019, 05/17/2019, 12/12/2019  ? Pneumococcal Conjugate-13 01/05/2016  ? Zoster Recombinat (Shingrix) 08/11/2017, 12/17/2017  ?  ? ?Objective: ?Vital Signs: BP (!) 142/93 (BP Location: Left Arm, Patient Position: Sitting, Cuff Size: Small)   Pulse 76   Resp 12   Ht 5\' 6"  (1.676 m)   Wt 171 lb (77.6 kg)   BMI 27.60 kg/m?   ? ?Physical Exam ?Vitals and nursing note reviewed.  ?Constitutional:   ?   Appearance: She is well-developed.  ?HENT:  ?   Head: Normocephalic and atraumatic.  ?Eyes:  ?   Conjunctiva/sclera: Conjunctivae normal.  ?Cardiovascular:  ?   Rate and Rhythm: Normal rate and regular rhythm.  ?   Heart sounds: Normal heart sounds.  ?Pulmonary:  ?   Effort: Pulmonary effort is normal.  ?   Breath sounds: Normal breath sounds.  ?Abdominal:  ?   General: Bowel sounds are normal.  ?   Palpations: Abdomen is soft.  ?Musculoskeletal:  ?   Cervical back: Normal range of motion.  ?Lymphadenopathy:  ?   Cervical: No cervical adenopathy.  ?Skin: ?   General: Skin is warm and dry.  ?   Capillary Refill: Capillary refill takes less than 2 seconds.  ?Neurological:  ?   Mental Status: She is alert and oriented to person, place, and time.  ?Psychiatric:     ?   Behavior: Behavior normal.  ?  ? ?Musculoskeletal Exam: C-spine was in good range of motion.  She had painful abduction and internal rotation of her right shoulder joint with abduction of 120 degrees.  Left shoulder joint was in full range of motion.  The joints, wrist joints, MCPs PIPs and DIPs with good range of motion with no synovitis.  Hip joints, knee joints, ankles with good range of motion.  She had no tenderness  over MTPs.  No muscular weakness or tenderness was noted. ? ?CDAI Exam: ?CDAI Score: -- ?Patient Global: --; Provider Global: -- ?Swollen: --; Tender: -- ?Joint Exam 07/23/2021  ? ?No joint exam has been documented for this visit  ? ?There is currently no information documented on the homunculus. Go to the Rheumatology activity and complete the homunculus joint exam. ? ?Investigation: ?No additional findings. ? ?Imaging: ?No results found. ? ?Recent Labs: ?Lab Results  ?Component Value Date  ? WBC 7.0 12/23/2020  ? HGB 14.6 12/23/2020  ? PLT 192 12/23/2020  ? NA 143 12/23/2020  ? K 3.8 12/23/2020  ? CL 107 12/23/2020  ? CO2 28 12/23/2020  ?  GLUCOSE 105 (H) 12/23/2020  ? BUN 17 04/14/2021  ? CREATININE 0.7 04/14/2021  ? BILITOT 0.7 12/23/2020  ? ALKPHOS 44 10/12/2016  ? AST 15 12/23/2020  ? ALT 14 12/23/2020  ? PROT 6.8 12/23/2020  ? ALBUMIN 4.2 10/12/2016  ? CALCIUM 9.3 04/14/2021  ? GFRAA 87 06/25/2020  ? QFTBGOLDPLUS NEGATIVE 07/07/2017  ? ? ?Speciality Comments: PLQ Eye Exam: 10/14/2020 WNL @ [facility]. Follow up 1 year. ?Fosamax May 08, 2021 ? ?Procedures:  ?Large Joint Inj: R subacromial bursa on 07/23/2021 1:44 PM ?Indications: pain ?Details: 27 G 1.5 in needle, lateral approach ? ?Arthrogram: No ? ?Medications: 1 mL lidocaine 1 %; 40 mg triamcinolone acetonide 40 MG/ML ?Aspirate: 0 mL ?Outcome: tolerated well, no immediate complications ?Procedure, treatment alternatives, risks and benefits explained, specific risks discussed. Consent was given by the patient. Immediately prior to procedure a time out was called to verify the correct patient, procedure, equipment, support staff and site/side marked as required. Patient was prepped and draped in the usual sterile fashion.  ? ? ?Allergies: Codeine, Flagyl [metronidazole hcl], Nexium [esomeprazole magnesium], Imuran [azathioprine], Sulfa antibiotics, and Sulfa drugs cross reactors  ? ?Assessment / Plan:     ?Visit Diagnoses: Acute pain of right  shoulder -she was seen today on urgent basis due to severe pain and discomfort in her right shoulder which is started about 3 days ago.  The pain is gradually getting worse.  She had tenderness on palpation

## 2021-07-23 NOTE — Patient Instructions (Signed)
Shoulder Exercises ?Ask your health care provider which exercises are safe for you. Do exercises exactly as told by your health care provider and adjust them as directed. It is normal to feel mild stretching, pulling, tightness, or discomfort as you do these exercises. Stop right away if you feel sudden pain or your pain gets worse. Do not begin these exercises until told by your health care provider. ?Stretching exercises ?External rotation and abduction ?This exercise is sometimes called corner stretch. This exercise rotates your arm outward (external rotation) and moves your arm out from your body (abduction). ?Stand in a doorway with one of your feet slightly in front of the other. This is called a staggered stance. If you cannot reach your forearms to the door frame, stand facing a corner of a room. ?Choose one of the following positions as told by your health care provider: ?Place your hands and forearms on the door frame above your head. ?Place your hands and forearms on the door frame at the height of your head. ?Place your hands on the door frame at the height of your elbows. ?Slowly move your weight onto your front foot until you feel a stretch across your chest and in the front of your shoulders. Keep your head and chest upright and keep your abdominal muscles tight. ?Hold for __________ seconds. ?To release the stretch, shift your weight to your back foot. ?Repeat __________ times. Complete this exercise __________ times a day. ?Extension, standing ?Stand and hold a broomstick, a cane, or a similar object behind your back. ?Your hands should be a little wider than shoulder width apart. ?Your palms should face away from your back. ?Keeping your elbows straight and your shoulder muscles relaxed, move the stick away from your body until you feel a stretch in your shoulders (extension). ?Avoid shrugging your shoulders while you move the stick. Keep your shoulder blades tucked down toward the middle of your  back. ?Hold for __________ seconds. ?Slowly return to the starting position. ?Repeat __________ times. Complete this exercise __________ times a day. ?Range-of-motion exercises ?Pendulum ? ?Stand near a wall or a surface that you can hold onto for balance. ?Bend at the waist and let your left / right arm hang straight down. Use your other arm to support you. Keep your back straight and do not lock your knees. ?Relax your left / right arm and shoulder muscles, and move your hips and your trunk so your left / right arm swings freely. Your arm should swing because of the motion of your body, not because you are using your arm or shoulder muscles. ?Keep moving your hips and trunk so your arm swings in the following directions, as told by your health care provider: ?Side to side. ?Forward and backward. ?In clockwise and counterclockwise circles. ?Continue each motion for __________ seconds, or for as long as told by your health care provider. ?Slowly return to the starting position. ?Repeat __________ times. Complete this exercise __________ times a day. ?Shoulder flexion, standing ? ?Stand and hold a broomstick, a cane, or a similar object. Place your hands a little more than shoulder width apart on the object. Your left / right hand should be palm up, and your other hand should be palm down. ?Keep your elbow straight and your shoulder muscles relaxed. Push the stick up with your healthy arm to raise your left / right arm in front of your body, and then over your head until you feel a stretch in your shoulder (flexion). ?Avoid  shrugging your shoulder while you raise your arm. Keep your shoulder blade tucked down toward the middle of your back. ?Hold for __________ seconds. ?Slowly return to the starting position. ?Repeat __________ times. Complete this exercise __________ times a day. ?Shoulder abduction, standing ?Stand and hold a broomstick, a cane, or a similar object. Place your hands a little more than shoulder  width apart on the object. Your left / right hand should be palm up, and your other hand should be palm down. ?Keep your elbow straight and your shoulder muscles relaxed. Push the object across your body toward your left / right side. Raise your left / right arm to the side of your body (abduction) until you feel a stretch in your shoulder. ?Do not raise your arm above shoulder height unless your health care provider tells you to do that. ?If directed, raise your arm over your head. ?Avoid shrugging your shoulder while you raise your arm. Keep your shoulder blade tucked down toward the middle of your back. ?Hold for __________ seconds. ?Slowly return to the starting position. ?Repeat __________ times. Complete this exercise __________ times a day. ?Internal rotation ? ?Place your left / right hand behind your back, palm up. ?Use your other hand to dangle an exercise band, a towel, or a similar object over your shoulder. Grasp the band with your left / right hand so you are holding on to both ends. ?Gently pull up on the band until you feel a stretch in the front of your left / right shoulder. The movement of your arm toward the center of your body is called internal rotation. ?Avoid shrugging your shoulder while you raise your arm. Keep your shoulder blade tucked down toward the middle of your back. ?Hold for __________ seconds. ?Release the stretch by letting go of the band and lowering your hands. ?Repeat __________ times. Complete this exercise __________ times a day. ?Strengthening exercises ?External rotation ? ?Sit in a stable chair without armrests. ?Secure an exercise band to a stable object at elbow height on your left / right side. ?Place a soft object, such as a folded towel or a small pillow, between your left / right upper arm and your body to move your elbow about 4 inches (10 cm) away from your side. ?Hold the end of the exercise band so it is tight and there is no slack. ?Keeping your elbow pressed  against the soft object, slowly move your forearm out, away from your abdomen (external rotation). Keep your body steady so only your forearm moves. ?Hold for __________ seconds. ?Slowly return to the starting position. ?Repeat __________ times. Complete this exercise __________ times a day. ?Shoulder abduction ? ?Sit in a stable chair without armrests, or stand up. ?Hold a __________ weight in your left / right hand, or hold an exercise band with both hands. ?Start with your arms straight down and your left / right palm facing in, toward your body. ?Slowly lift your left / right hand out to your side (abduction). Do not lift your hand above shoulder height unless your health care provider tells you that this is safe. ?Keep your arms straight. ?Avoid shrugging your shoulder while you do this movement. Keep your shoulder blade tucked down toward the middle of your back. ?Hold for __________ seconds. ?Slowly lower your arm, and return to the starting position. ?Repeat __________ times. Complete this exercise __________ times a day. ?Shoulder extension ?Sit in a stable chair without armrests, or stand up. ?Secure an exercise band  to a stable object in front of you so it is at shoulder height. ?Hold one end of the exercise band in each hand. Your palms should face each other. ?Straighten your elbows and lift your hands up to shoulder height. ?Step back, away from the secured end of the exercise band, until the band is tight and there is no slack. ?Squeeze your shoulder blades together as you pull your hands down to the sides of your thighs (extension). Stop when your hands are straight down by your sides. Do not let your hands go behind your body. ?Hold for __________ seconds. ?Slowly return to the starting position. ?Repeat __________ times. Complete this exercise __________ times a day. ?Shoulder row ?Sit in a stable chair without armrests, or stand up. ?Secure an exercise band to a stable object in front of you so it  is at waist height. ?Hold one end of the exercise band in each hand. Position your palms so that your thumbs are facing the ceiling (neutral position). ?Bend each of your elbows to a 90-degree angle (righ

## 2021-07-29 DIAGNOSIS — Z20822 Contact with and (suspected) exposure to covid-19: Secondary | ICD-10-CM | POA: Diagnosis not present

## 2021-08-07 DIAGNOSIS — Z20822 Contact with and (suspected) exposure to covid-19: Secondary | ICD-10-CM | POA: Diagnosis not present

## 2021-08-08 DIAGNOSIS — C37 Malignant neoplasm of thymus: Secondary | ICD-10-CM | POA: Diagnosis not present

## 2021-08-08 DIAGNOSIS — K7689 Other specified diseases of liver: Secondary | ICD-10-CM | POA: Diagnosis not present

## 2021-08-08 DIAGNOSIS — E039 Hypothyroidism, unspecified: Secondary | ICD-10-CM | POA: Diagnosis not present

## 2021-08-08 DIAGNOSIS — E119 Type 2 diabetes mellitus without complications: Secondary | ICD-10-CM | POA: Diagnosis not present

## 2021-08-08 DIAGNOSIS — K2100 Gastro-esophageal reflux disease with esophagitis, without bleeding: Secondary | ICD-10-CM | POA: Diagnosis not present

## 2021-08-08 DIAGNOSIS — E7849 Other hyperlipidemia: Secondary | ICD-10-CM | POA: Diagnosis not present

## 2021-08-08 DIAGNOSIS — E782 Mixed hyperlipidemia: Secondary | ICD-10-CM | POA: Diagnosis not present

## 2021-08-08 DIAGNOSIS — R5381 Other malaise: Secondary | ICD-10-CM | POA: Diagnosis not present

## 2021-08-14 ENCOUNTER — Encounter: Payer: Self-pay | Admitting: Rheumatology

## 2021-08-15 DIAGNOSIS — E8881 Metabolic syndrome: Secondary | ICD-10-CM | POA: Diagnosis not present

## 2021-08-15 DIAGNOSIS — K7581 Nonalcoholic steatohepatitis (NASH): Secondary | ICD-10-CM | POA: Diagnosis not present

## 2021-08-15 DIAGNOSIS — I10 Essential (primary) hypertension: Secondary | ICD-10-CM | POA: Diagnosis not present

## 2021-08-15 DIAGNOSIS — I700 Atherosclerosis of aorta: Secondary | ICD-10-CM | POA: Diagnosis not present

## 2021-08-15 DIAGNOSIS — R7301 Impaired fasting glucose: Secondary | ICD-10-CM | POA: Diagnosis not present

## 2021-08-15 DIAGNOSIS — G43109 Migraine with aura, not intractable, without status migrainosus: Secondary | ICD-10-CM | POA: Diagnosis not present

## 2021-08-15 DIAGNOSIS — M3390 Dermatopolymyositis, unspecified, organ involvement unspecified: Secondary | ICD-10-CM | POA: Diagnosis not present

## 2021-08-15 DIAGNOSIS — C37 Malignant neoplasm of thymus: Secondary | ICD-10-CM | POA: Diagnosis not present

## 2021-08-25 DIAGNOSIS — Z79899 Other long term (current) drug therapy: Secondary | ICD-10-CM | POA: Diagnosis not present

## 2021-08-25 DIAGNOSIS — H40053 Ocular hypertension, bilateral: Secondary | ICD-10-CM | POA: Diagnosis not present

## 2021-08-25 DIAGNOSIS — G43B0 Ophthalmoplegic migraine, not intractable: Secondary | ICD-10-CM | POA: Diagnosis not present

## 2021-08-25 DIAGNOSIS — H43812 Vitreous degeneration, left eye: Secondary | ICD-10-CM | POA: Diagnosis not present

## 2021-08-25 DIAGNOSIS — H2513 Age-related nuclear cataract, bilateral: Secondary | ICD-10-CM | POA: Diagnosis not present

## 2021-08-29 DIAGNOSIS — M3500 Sicca syndrome, unspecified: Secondary | ICD-10-CM | POA: Diagnosis not present

## 2021-08-29 DIAGNOSIS — G44219 Episodic tension-type headache, not intractable: Secondary | ICD-10-CM | POA: Diagnosis not present

## 2021-08-29 DIAGNOSIS — Z79818 Long term (current) use of other agents affecting estrogen receptors and estrogen levels: Secondary | ICD-10-CM | POA: Diagnosis not present

## 2021-08-29 DIAGNOSIS — M3390 Dermatopolymyositis, unspecified, organ involvement unspecified: Secondary | ICD-10-CM | POA: Diagnosis not present

## 2021-08-29 DIAGNOSIS — H538 Other visual disturbances: Secondary | ICD-10-CM | POA: Diagnosis not present

## 2021-08-29 DIAGNOSIS — G43109 Migraine with aura, not intractable, without status migrainosus: Secondary | ICD-10-CM | POA: Diagnosis not present

## 2021-08-29 DIAGNOSIS — R42 Dizziness and giddiness: Secondary | ICD-10-CM | POA: Diagnosis not present

## 2021-09-17 ENCOUNTER — Other Ambulatory Visit: Payer: Self-pay | Admitting: Rheumatology

## 2021-09-17 NOTE — Telephone Encounter (Signed)
Next Visit: 10/02/2021  Last Visit: 07/23/2021  Last Fill: 06/06/2021  DX: Dermatomyositis  Current Dose per office note 07/23/2021: MTX 0.8 ml every 7 days  Labs: 08/08/2021 Eos (absolute) 0.5, Sodium 145  Okay to refill MTX?

## 2021-09-20 ENCOUNTER — Other Ambulatory Visit: Payer: Self-pay | Admitting: Physician Assistant

## 2021-09-20 DIAGNOSIS — M3501 Sicca syndrome with keratoconjunctivitis: Secondary | ICD-10-CM

## 2021-09-22 ENCOUNTER — Other Ambulatory Visit: Payer: Self-pay | Admitting: Rheumatology

## 2021-09-22 NOTE — Telephone Encounter (Signed)
Next Visit: 10/02/2021  Last Visit: 07/23/2021  Labs: 08/08/2021 Eos (absolute) 0.5, Sodium 145  Eye exam: 7/11/022   Current Dose per office note 07/23/2021: Plaquenil 200 mg 1 tablet QD I called patient, patient is taking RX  Take 200mg  by mouth twice daily Monday through Friday.         DX: Sjogren's syndrome with keratoconjunctivitis sicca   Last Fill: 06/23/2021  Okay to refill Plaquenil?

## 2021-09-25 NOTE — Progress Notes (Addendum)
Office Visit Note  Patient: KENNETH LAX             Date of Birth: February 07, 1951           MRN: [id number]             PCP: Caryl Bis, MD Referring: Caryl Bis, MD Visit Date: 10/02/2021 Occupation: @GUAROCC @  Subjective:  Medication Management   History of Present Illness: BRITTANI PURDUM is a 71 y.o. female with history of dermatomyositis, Sjogren's, osteoarthritis and osteoporosis.  She has been taking methotrexate 0.8 dental subcutaneous weekly and Plaquenil 200 mg p.o. twice daily Monday to Friday along with prednisone 3 mg p.o. daily.  She has not noticed any increased muscle weakness or tenderness.  She had no rash since her last visit.  She has been having increased pain and discomfort in her right knee joint.  Activities of Daily Living:  Patient reports morning stiffness for  0 minute.   Patient Reports nocturnal pain.  Difficulty dressing/grooming: Denies Difficulty climbing stairs: Reports  Difficulty getting out of chair: Denies Difficulty using hands for taps, buttons, cutlery, and/or writing: Denies  Review of Systems  Constitutional:  Negative for fatigue.  HENT:  Positive for mouth dryness. Negative for sore throat.   Eyes:  Positive for dryness.  Respiratory:  Negative for shortness of breath.   Cardiovascular: Negative.  Negative for chest pain, irregular heartbeat and swelling in legs/feet.  Gastrointestinal: Negative.  Negative for constipation and diarrhea.  Endocrine: Positive for cold intolerance. Negative for increased urination.  Genitourinary:  Negative for involuntary urination.  Musculoskeletal:  Positive for joint pain, gait problem, joint pain, myalgias, morning stiffness, muscle tenderness and myalgias.  Skin:  Positive for color change. Negative for rash, hair loss and sensitivity to sunlight.  Allergic/Immunologic: Negative for susceptible to infections.  Neurological:  Positive for headaches. Negative for dizziness, fainting,  numbness and paresthesias.  Hematological:  Negative for bruising/bleeding tendency and swollen glands.  Psychiatric/Behavioral:  Positive for sleep disturbance. Negative for depressed mood. The patient is not nervous/anxious.     PMFS History:  Patient Active Problem List   Diagnosis Date Noted   Palpitations 01/15/2021   Hypothyroidism 07/26/2019   Primary osteoarthritis of right knee 02/03/2018   Primary osteoarthritis of both hands 09/08/2017   Sjogren's disease ([place]) 02/04/2016   Dermatomyositis ([place]) 02/04/2016   Former smoker 02/04/2016   Thymoma 02/04/2016   Fatigue 02/15/2012    Past Medical History:  Diagnosis Date   Allergy    Anxiety    Cancer ([place])    basal cell carcinoma - face   Dermatomyositis ([place]) 02/04/2016   Jo 1 Positive  Skin Bx- Subtle interface Dermatitis    Former smoker 02/04/2016   GERD (gastroesophageal reflux disease)    otc med prn   PONV (postoperative nausea and vomiting)    Raynaud's disease    Sjogren's disease ([place]) 02/04/2016   Positive ANA, Positive Ro, Positive CCP, Negative RF, Parotitis, Positive Sicca   Sjogren's syndrome ([place])    from MD note    Family History  Problem Relation Age of Onset   COPD Mother    Heart disease Mother    Diabetes Father    Kidney disease Father    Epilepsy Daughter    Rectal cancer Maternal Uncle    Esophageal cancer Neg Hx    Stomach cancer Neg Hx    Ulcerative colitis Neg Hx    Past Surgical History:  Procedure Laterality Date  BREAST SURGERY     augmentation   BUNIONECTOMY     right foot   COLONOSCOPY     DIAGNOSTIC LAPAROSCOPY     DILATION AND CURETTAGE OF UTERUS  04/15/2011   Procedure: DILATATION AND CURETTAGE;  Surgeon: [name];  Location: [facility];  Service: Gynecology;  Laterality: N/A;   IR RADIOLOGIST EVAL & MGMT  02/04/2021   MUSCLE BIOPSY Left 01/02/2016   Procedure: MUSCLE BIOPSY LEFT DELTOID;  Surgeon: [name], MD;  Location: [facility];  Service:  General;  Laterality: Left;  left deltoid muscle bx   NASAL SEPTUM SURGERY     SVD     x 2   TONSILLECTOMY     WISDOM TOOTH EXTRACTION     Social History   Social History Narrative   Not on file   Immunization History  Administered Date(s) Administered   Influenza, High Dose Seasonal PF 01/07/2018   Influenza-Unspecified 01/04/2014   PFIZER(Purple Top)SARS-COV-2 Vaccination 04/26/2019, 05/17/2019, 12/12/2019   Pneumococcal Conjugate-13 01/05/2016   Zoster Recombinat (Shingrix) 08/11/2017, 12/17/2017     Objective: Vital Signs: BP 112/76   Pulse 68   Ht 5\' 6"  (1.676 m)   Wt 175 lb (79.4 kg)   BMI 28.25 kg/m    Physical Exam Vitals and nursing note reviewed.  Constitutional:      Appearance: She is well-developed.  HENT:     Head: Normocephalic and atraumatic.  Eyes:     Conjunctiva/sclera: Conjunctivae normal.  Cardiovascular:     Rate and Rhythm: Normal rate and regular rhythm.     Heart sounds: Normal heart sounds.  Pulmonary:     Effort: Pulmonary effort is normal.     Breath sounds: Normal breath sounds.  Abdominal:     General: Bowel sounds are normal.     Palpations: Abdomen is soft.  Musculoskeletal:     Cervical back: Normal range of motion.  Lymphadenopathy:     Cervical: No cervical adenopathy.  Skin:    General: Skin is warm and dry.     Capillary Refill: Capillary refill takes less than 2 seconds.  Neurological:     Mental Status: She is alert and oriented to person, place, and time.  Psychiatric:        Behavior: Behavior normal.      Musculoskeletal Exam: C-spine was in good range of motion.  Shoulder joints, elbow joints, wrist joints, MCPs PIPs and DIPs with good range of motion.  She had bilateral PIP and DIP thickening.  Hip joints and knee joints in good range of motion.  She had warmth and swelling in her right knee joint with small effusion.  There was no tenderness over ankles or MTPs.  CDAI Exam: CDAI Score: -- Patient Global: --;  Provider Global: -- Swollen: --; Tender: -- Joint Exam 10/02/2021   No joint exam has been documented for this visit   There is currently no information documented on the homunculus. Go to the Rheumatology activity and complete the homunculus joint exam.  Investigation: No additional findings.  Imaging: No results found.  Recent Labs: Lab Results  Component Value Date   WBC 7.0 12/23/2020   HGB 14.6 12/23/2020   PLT 192 12/23/2020   NA 143 12/23/2020   K 3.8 12/23/2020   CL 107 12/23/2020   CO2 28 12/23/2020   GLUCOSE 105 (H) 12/23/2020   BUN 17 04/14/2021   CREATININE 0.7 04/14/2021   BILITOT 0.7 12/23/2020   ALKPHOS 44 10/12/2016  AST 15 12/23/2020   ALT 14 12/23/2020   PROT 6.8 12/23/2020   ALBUMIN 4.2 10/12/2016   CALCIUM 9.3 04/14/2021   GFRAA 87 06/25/2020   QFTBGOLDPLUS NEGATIVE 07/07/2017    Speciality Comments: PLQ Eye Exam: 10/14/2020 WNL @ [facility] Follow up 1 year. Fosamax May 08, 2021  Procedures:  Large Joint Inj: R knee on 10/02/2021 3:14 PM Indications: pain and joint swelling Details: 27 G 1.5 in needle, medial approach  Arthrogram: No  Medications: 40 mg triamcinolone acetonide 40 MG/ML; 1.5 mL lidocaine 1 % Aspirate: 0 mL Outcome: tolerated well, no immediate complications Procedure, treatment alternatives, risks and benefits explained, specific risks discussed. Consent was given by the patient. Immediately prior to procedure a time out was called to verify the correct patient, procedure, equipment, support staff and site/side marked as required. Patient was prepped and draped in the usual sterile fashion.     Allergies: Codeine, Flagyl [metronidazole hcl], Nexium [esomeprazole magnesium], Imuran [azathioprine], Sulfa antibiotics, and Sulfa drugs cross reactors   Assessment / Plan:     Visit Diagnoses: Dermatomyositis ([place]) - Jo 1+, Positive muscle biopsy for dermatomyositis: Patient denies any increased muscular  weakness or tenderness.  She had no rash on the examination.  Sjogren's syndrome with keratoconjunctivitis sicca ([facility])-she continues to have sicca symptoms.  Over-the-counter products were discussed.  High risk medication use - MTX 0.8 ml every 7 days, folic acid 2mg  po qd, Plaquenil 200 mg BID M-F,prednisone 3.0 mg every day. PLQ Eye Exam: 10/14/2020.  The patient had CK on September 30, 2021 which improved at 66.  Long term (current) use of systemic steroids-long-term use of steroid use was reviewed.  Patient understands the side effects.  Primary osteoarthritis of both hands-joint protection muscle strengthening was discussed.  Chronic pain of right knee-she has been experiencing increased pain and discomfort in her right knee joint.  A small effusion warmth was noted.  She is longstanding history of osteoarthritis.  After informed consent was obtained left knee joint was injected with lidocaine and cortisone as described above.  Patient tolerated the procedure well.  Postprocedure instructions were given.  Primary osteoarthritis of both knees-she has discomfort off and on.  Age-related osteoporosis without current pathological fracture - DEXA on 06/02/2018: AP spine T score -2.7 with BMD 0.95.  -15.2% change from previous DEXA.  Patient states she had a repeat DEXA with a T score of -2.2.    History of vitamin D deficiency-she has been taking vitamin D.  Thymoma  Former smoker  Orders: Orders Placed This Doctor, hospital Inj   Meds ordered this encounter  Medications   predniSONE (DELTASONE) 1 MG tablet    Sig: Take 3 mg tablet by mouth daily with breakfast.    Dispense:  270 tablet    Refill:  1    Face-to-face time spent with patient was 30 minutes. Greater than 50% of time was spent in counseling and coordination of care.  Follow-Up Instructions: Return in about 3 months (around 01/02/2022) for Dermatomyositis.   Bo Merino, MD  Note - This record has  been created using Editor, commissioning.  Chart creation errors have been sought, but may not always  have been located. Such creation errors do not reflect on  the standard of medical care.

## 2021-09-28 ENCOUNTER — Encounter: Payer: Self-pay | Admitting: Rheumatology

## 2021-09-29 ENCOUNTER — Other Ambulatory Visit: Payer: Self-pay | Admitting: *Deleted

## 2021-09-29 DIAGNOSIS — M3313 Other dermatomyositis without myopathy: Secondary | ICD-10-CM

## 2021-09-29 DIAGNOSIS — M3390 Dermatopolymyositis, unspecified, organ involvement unspecified: Secondary | ICD-10-CM

## 2021-09-29 DIAGNOSIS — Z79899 Other long term (current) drug therapy: Secondary | ICD-10-CM

## 2021-09-30 DIAGNOSIS — M3390 Dermatopolymyositis, unspecified, organ involvement unspecified: Secondary | ICD-10-CM | POA: Diagnosis not present

## 2021-09-30 DIAGNOSIS — Z79899 Other long term (current) drug therapy: Secondary | ICD-10-CM | POA: Diagnosis not present

## 2021-10-01 LAB — CK: Total CK: 77 U/L (ref 29–143)

## 2021-10-01 NOTE — Progress Notes (Signed)
CK is normal.

## 2021-10-02 ENCOUNTER — Encounter: Payer: Self-pay | Admitting: Rheumatology

## 2021-10-02 ENCOUNTER — Ambulatory Visit (INDEPENDENT_AMBULATORY_CARE_PROVIDER_SITE_OTHER): Payer: Medicare Other | Admitting: Rheumatology

## 2021-10-02 VITALS — BP 112/76 | HR 68 | Ht 66.0 in | Wt 175.0 lb

## 2021-10-02 DIAGNOSIS — Z7952 Long term (current) use of systemic steroids: Secondary | ICD-10-CM

## 2021-10-02 DIAGNOSIS — M3390 Dermatopolymyositis, unspecified, organ involvement unspecified: Secondary | ICD-10-CM | POA: Diagnosis not present

## 2021-10-02 DIAGNOSIS — D4989 Neoplasm of unspecified behavior of other specified sites: Secondary | ICD-10-CM | POA: Diagnosis not present

## 2021-10-02 DIAGNOSIS — M25562 Pain in left knee: Secondary | ICD-10-CM

## 2021-10-02 DIAGNOSIS — G8929 Other chronic pain: Secondary | ICD-10-CM | POA: Diagnosis not present

## 2021-10-02 DIAGNOSIS — M170 Bilateral primary osteoarthritis of knee: Secondary | ICD-10-CM | POA: Diagnosis not present

## 2021-10-02 DIAGNOSIS — Z79899 Other long term (current) drug therapy: Secondary | ICD-10-CM

## 2021-10-02 DIAGNOSIS — M810 Age-related osteoporosis without current pathological fracture: Secondary | ICD-10-CM | POA: Diagnosis not present

## 2021-10-02 DIAGNOSIS — Z87891 Personal history of nicotine dependence: Secondary | ICD-10-CM | POA: Diagnosis not present

## 2021-10-02 DIAGNOSIS — M3501 Sicca syndrome with keratoconjunctivitis: Secondary | ICD-10-CM | POA: Diagnosis not present

## 2021-10-02 DIAGNOSIS — M19042 Primary osteoarthritis, left hand: Secondary | ICD-10-CM | POA: Diagnosis not present

## 2021-10-02 DIAGNOSIS — M19041 Primary osteoarthritis, right hand: Secondary | ICD-10-CM | POA: Diagnosis not present

## 2021-10-02 DIAGNOSIS — Z8639 Personal history of other endocrine, nutritional and metabolic disease: Secondary | ICD-10-CM

## 2021-10-02 MED ORDER — TRIAMCINOLONE ACETONIDE 40 MG/ML IJ SUSP
40.0000 mg | INTRAMUSCULAR | Status: AC | PRN
  Administered 2021-10-02: 40 mg via INTRA_ARTICULAR

## 2021-10-02 MED ORDER — LIDOCAINE HCL 1 % IJ SOLN
1.5000 mL | INTRAMUSCULAR | Status: AC | PRN
  Administered 2021-10-02: 1.5 mL

## 2021-10-02 MED ORDER — PREDNISONE 1 MG PO TABS: ORAL_TABLET | ORAL | 1 refills | Status: DC

## 2021-10-02 NOTE — Patient Instructions (Signed)
Standing Labs We placed an order today for your standing lab work.   Please have your standing labs drawn in August  and every 3 months  If possible, please have your labs drawn 2 weeks prior to your appointment so that the provider can discuss your results at your appointment.  Please note that you may see your imaging and lab results in MyChart before we have reviewed them. We may be awaiting multiple results to interpret others before contacting you. Please allow our office up to 72 hours to thoroughly review all of the results before contacting the office for clarification of your results.  We have open lab daily: Monday through Thursday from 1:30-4:30 PM and Friday from 1:30-4:00 PM at the office of Dr. Tonesha Tsou, [facility].   Please be advised, all patients with office appointments requiring lab work will take precedent over walk-in lab work.  If possible, please come for your lab work on Monday and Friday afternoons, as you may experience shorter wait times. The office is located at [address], [address], [place], [place] [address] No appointment is necessary.   Labs are drawn by [facility]. Please bring your co-pay at the time of your lab draw.  You may receive a bill from [facility] for your lab work.  Please note if you are on Hydroxychloroquine and and an order has been placed for a Hydroxychloroquine level, you will need to have it drawn 4 hours or more after your last dose.  If you wish to have your labs drawn at another location, please call the office 24 hours in advance to send orders.  If you have any questions regarding directions or hours of operation,  please call [phone].   As a reminder, please drink plenty of water prior to coming for your lab work. Thanks!   Vaccines You are taking a medication(s) that can suppress your immune system.  The following immunizations are recommended: Flu annually Covid-19  Td/Tdap (tetanus, diphtheria,  pertussis) every 10 years Pneumonia (Prevnar 15 then Pneumovax 23 at least 1 year apart.  Alternatively, can take Prevnar 20 without needing additional dose) Shingrix: 2 doses from 4 weeks to 6 months apart  Please check with your PCP to make sure you are up to date.   If you have signs or symptoms of an infection or start antibiotics: First, call your PCP for workup of your infection. Hold your medication through the infection, until you complete your antibiotics, and until symptoms resolve if you take the following: Injectable medication (Actemra, Benlysta, Cimzia, Cosentyx, Enbrel, Humira, Kevzara, Orencia, Remicade, Simponi, Stelara, Taltz, Tremfya) Methotrexate Leflunomide (Arava) Mycophenolate (Cellcept) Xeljanz, Olumiant, or Rinvoq  

## 2021-10-09 DIAGNOSIS — R29818 Other symptoms and signs involving the nervous system: Secondary | ICD-10-CM | POA: Diagnosis not present

## 2021-10-09 DIAGNOSIS — G43109 Migraine with aura, not intractable, without status migrainosus: Secondary | ICD-10-CM | POA: Diagnosis not present

## 2021-10-17 DIAGNOSIS — G44219 Episodic tension-type headache, not intractable: Secondary | ICD-10-CM | POA: Diagnosis not present

## 2021-10-17 DIAGNOSIS — G44211 Episodic tension-type headache, intractable: Secondary | ICD-10-CM | POA: Diagnosis not present

## 2021-10-17 DIAGNOSIS — G43109 Migraine with aura, not intractable, without status migrainosus: Secondary | ICD-10-CM | POA: Diagnosis not present

## 2021-11-24 DIAGNOSIS — G4719 Other hypersomnia: Secondary | ICD-10-CM | POA: Diagnosis not present

## 2021-11-24 DIAGNOSIS — G4700 Insomnia, unspecified: Secondary | ICD-10-CM | POA: Diagnosis not present

## 2021-11-24 DIAGNOSIS — R0683 Snoring: Secondary | ICD-10-CM | POA: Diagnosis not present

## 2021-11-24 DIAGNOSIS — G4763 Sleep related bruxism: Secondary | ICD-10-CM | POA: Diagnosis not present

## 2021-11-24 DIAGNOSIS — G478 Other sleep disorders: Secondary | ICD-10-CM | POA: Diagnosis not present

## 2021-11-24 DIAGNOSIS — R519 Headache, unspecified: Secondary | ICD-10-CM | POA: Diagnosis not present

## 2021-11-24 DIAGNOSIS — G4710 Hypersomnia, unspecified: Secondary | ICD-10-CM | POA: Diagnosis not present

## 2021-12-01 ENCOUNTER — Other Ambulatory Visit: Payer: Self-pay | Admitting: Rheumatology

## 2021-12-01 DIAGNOSIS — Z79899 Other long term (current) drug therapy: Secondary | ICD-10-CM

## 2021-12-01 DIAGNOSIS — M3390 Dermatopolymyositis, unspecified, organ involvement unspecified: Secondary | ICD-10-CM

## 2021-12-02 DIAGNOSIS — Z79899 Other long term (current) drug therapy: Secondary | ICD-10-CM | POA: Diagnosis not present

## 2021-12-02 DIAGNOSIS — M3390 Dermatopolymyositis, unspecified, organ involvement unspecified: Secondary | ICD-10-CM | POA: Diagnosis not present

## 2021-12-02 MED ORDER — METHOTREXATE SODIUM CHEMO INJECTION 250 MG/10ML: INTRAMUSCULAR | 0 refills | Status: DC

## 2021-12-02 NOTE — Telephone Encounter (Signed)
Next Visit: 01/07/2022  Last Visit: 6/29/20223  Last Fill: 09/17/2021  DX: Dermatomyositis  Current Dose per office note 10/02/2021: MTX 0.8 ml every 7 days  Labs: CK 09/30/2021 CK is normal. 08/08/2021 Eos (absolute) 0.5, Sodium 145, TSH 4.860, A1C 5.8, Patient will have labs drawn today.  ,  Okay to refill MTX?

## 2021-12-03 ENCOUNTER — Encounter: Payer: Self-pay | Admitting: Rheumatology

## 2021-12-03 DIAGNOSIS — M3390 Dermatopolymyositis, unspecified, organ involvement unspecified: Secondary | ICD-10-CM

## 2021-12-03 DIAGNOSIS — Z79899 Other long term (current) drug therapy: Secondary | ICD-10-CM

## 2021-12-03 LAB — COMPLETE METABOLIC PANEL WITH GFR
AG Ratio: 1.9 (calc) (ref 1.0–2.5)
ALT: 15 U/L (ref 6–29)
AST: 19 U/L (ref 10–35)
Albumin: 4.6 g/dL (ref 3.6–5.1)
Alkaline phosphatase (APISO): 42 U/L (ref 37–153)
BUN: 21 mg/dL (ref 7–25)
CO2: 24 mmol/L (ref 20–32)
Calcium: 9.3 mg/dL (ref 8.6–10.4)
Chloride: 106 mmol/L (ref 98–110)
Creat: 0.74 mg/dL (ref 0.60–1.00)
Globulin: 2.4 g/dL (calc) (ref 1.9–3.7)
Glucose, Bld: 83 mg/dL (ref 65–139)
Potassium: 4.5 mmol/L (ref 3.5–5.3)
Sodium: 140 mmol/L (ref 135–146)
Total Bilirubin: 0.4 mg/dL (ref 0.2–1.2)
Total Protein: 7 g/dL (ref 6.1–8.1)
eGFR: 87 mL/min/{1.73_m2} (ref >=60)

## 2021-12-03 LAB — CBC WITH DIFFERENTIAL/PLATELET
Absolute Monocytes: 927 cells/uL (ref 200–950)
Basophils Absolute: 76 cells/uL (ref 0–200)
Basophils Relative: 0.7 %
Eosinophils Absolute: 360 cells/uL (ref 15–500)
Eosinophils Relative: 3.3 %
HCT: 44.6 % (ref 35.0–45.0)
Hemoglobin: 14.9 g/dL (ref 11.7–15.5)
Lymphs Abs: 1657 cells/uL (ref 850–3900)
MCH: 30.8 pg (ref 27.0–33.0)
MCHC: 33.4 g/dL (ref 32.0–36.0)
MCV: 92.3 fL (ref 80.0–100.0)
MPV: 11.4 fL (ref 7.5–12.5)
Monocytes Relative: 8.5 %
Neutro Abs: 7881 cells/uL — ABNORMAL HIGH (ref 1500–7800)
Neutrophils Relative %: 72.3 %
Platelets: 184 10*3/uL (ref 140–400)
RBC: 4.83 10*6/uL (ref 3.80–5.10)
RDW: 13.6 % (ref 11.0–15.0)
Total Lymphocyte: 15.2 %
WBC: 10.9 10*3/uL — ABNORMAL HIGH (ref 3.8–10.8)

## 2021-12-03 LAB — CK: Total CK: 103 U/L (ref 29–143)

## 2021-12-03 NOTE — Telephone Encounter (Signed)
CK level is mildly elevated compared to previous value which is a still in the normal range.  White cell count is elevated.  Which could be due to stress or infection.  Patient can repeat WBC count and CK in 1 month.  If she is having increased discomfort she can go back to prednisone 2 mg daily.

## 2021-12-12 DIAGNOSIS — E8881 Metabolic syndrome: Secondary | ICD-10-CM | POA: Diagnosis not present

## 2021-12-12 DIAGNOSIS — E7849 Other hyperlipidemia: Secondary | ICD-10-CM | POA: Diagnosis not present

## 2021-12-12 DIAGNOSIS — K2100 Gastro-esophageal reflux disease with esophagitis, without bleeding: Secondary | ICD-10-CM | POA: Diagnosis not present

## 2021-12-12 DIAGNOSIS — E039 Hypothyroidism, unspecified: Secondary | ICD-10-CM | POA: Diagnosis not present

## 2021-12-12 DIAGNOSIS — E119 Type 2 diabetes mellitus without complications: Secondary | ICD-10-CM | POA: Diagnosis not present

## 2021-12-12 DIAGNOSIS — R5381 Other malaise: Secondary | ICD-10-CM | POA: Diagnosis not present

## 2021-12-12 DIAGNOSIS — E782 Mixed hyperlipidemia: Secondary | ICD-10-CM | POA: Diagnosis not present

## 2021-12-19 DIAGNOSIS — M3390 Dermatopolymyositis, unspecified, organ involvement unspecified: Secondary | ICD-10-CM | POA: Diagnosis not present

## 2021-12-19 DIAGNOSIS — R7301 Impaired fasting glucose: Secondary | ICD-10-CM | POA: Diagnosis not present

## 2021-12-19 DIAGNOSIS — G43109 Migraine with aura, not intractable, without status migrainosus: Secondary | ICD-10-CM | POA: Diagnosis not present

## 2021-12-19 DIAGNOSIS — E7849 Other hyperlipidemia: Secondary | ICD-10-CM | POA: Diagnosis not present

## 2021-12-19 DIAGNOSIS — E8881 Metabolic syndrome: Secondary | ICD-10-CM | POA: Diagnosis not present

## 2021-12-19 DIAGNOSIS — K7581 Nonalcoholic steatohepatitis (NASH): Secondary | ICD-10-CM | POA: Diagnosis not present

## 2021-12-19 DIAGNOSIS — I700 Atherosclerosis of aorta: Secondary | ICD-10-CM | POA: Diagnosis not present

## 2021-12-19 DIAGNOSIS — Z6827 Body mass index (BMI) 27.0-27.9, adult: Secondary | ICD-10-CM | POA: Diagnosis not present

## 2021-12-19 DIAGNOSIS — C37 Malignant neoplasm of thymus: Secondary | ICD-10-CM | POA: Diagnosis not present

## 2021-12-19 DIAGNOSIS — Z1389 Encounter for screening for other disorder: Secondary | ICD-10-CM | POA: Diagnosis not present

## 2021-12-19 DIAGNOSIS — Z23 Encounter for immunization: Secondary | ICD-10-CM | POA: Diagnosis not present

## 2021-12-19 DIAGNOSIS — Z1331 Encounter for screening for depression: Secondary | ICD-10-CM | POA: Diagnosis not present

## 2021-12-24 NOTE — Progress Notes (Deleted)
Office Visit Note  Patient: Marisa Orozco             Date of Birth: 1950/05/04           MRN: [id number]             PCP: Caryl Bis, MD Referring: Caryl Bis, MD Visit Date: 01/07/2022 Occupation: @GUAROCC @  Subjective:  No chief complaint on file.   History of Present Illness: Marisa Orozco is a 71 y.o. female ***   Activities of Daily Living:  Patient reports morning stiffness for *** {minute/hour:19697}.   Patient {ACTIONS;DENIES/REPORTS:21021675::"Denies"} nocturnal pain.  Difficulty dressing/grooming: {ACTIONS;DENIES/REPORTS:21021675::"Denies"} Difficulty climbing stairs: {ACTIONS;DENIES/REPORTS:21021675::"Denies"} Difficulty getting out of chair: {ACTIONS;DENIES/REPORTS:21021675::"Denies"} Difficulty using hands for taps, buttons, cutlery, and/or writing: {ACTIONS;DENIES/REPORTS:21021675::"Denies"}  No Rheumatology ROS completed.   PMFS History:  Patient Active Problem List   Diagnosis Date Noted  . Palpitations 01/15/2021  . Hypothyroidism 07/26/2019  . Primary osteoarthritis of right knee 02/03/2018  . Primary osteoarthritis of both hands 09/08/2017  . Sjogren's disease ([place]) 02/04/2016  . Dermatomyositis ([place]) 02/04/2016  . Former smoker 02/04/2016  . 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 Fatigue 02/15/2012    Past Medical History:  Diagnosis Date  . Allergy   . Anxiety   . Cancer ([place])    basal cell carcinoma - face  . 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 Former smoker 02/04/2016  . GERD (gastroesophageal reflux disease)    otc med prn  . PONV (postoperative nausea and vomiting)   . Raynaud's disease   . Sjogren's disease ([place]) 02/04/2016   Positive ANA, Positive Ro, Positive CCP, Negative RF, Parotitis, Positive Sicca  . Sjogren's syndrome ([place])    from MD note    Family History  Problem Relation Age of Onset  . COPD Mother   . Heart disease Mother   . Diabetes Father   . Kidney disease  Father   . Epilepsy Daughter   . Rectal cancer Maternal Uncle   . Esophageal cancer Neg Hx   . Stomach cancer Neg Hx   . Ulcerative colitis Neg Hx    Past Surgical History:  Procedure Laterality Date  . BREAST SURGERY     augmentation  . BUNIONECTOMY     right foot  . COLONOSCOPY    . DIAGNOSTIC LAPAROSCOPY    . DILATION AND CURETTAGE OF UTERUS  04/15/2011   Procedure: DILATATION AND CURETTAGE;  Surgeon: [name];  Location: [facility];  Service: Gynecology;  Laterality: N/A;  . IR RADIOLOGIST EVAL & MGMT  02/04/2021  . MUSCLE BIOPSY Left 01/02/2016   Procedure: MUSCLE BIOPSY LEFT DELTOID;  Surgeon: [name], MD;  Location: [facility];  Service: General;  Laterality: Left;  left deltoid muscle bx  . NASAL SEPTUM SURGERY    . SVD     x 2  . TONSILLECTOMY    . WISDOM TOOTH EXTRACTION     Social History   Social History Narrative  . Not on file   Immunization History  Administered Date(s) Administered  . Influenza, High Dose Seasonal PF 01/07/2018  . Influenza-Unspecified 01/04/2014  . PFIZER(Purple Top)SARS-COV-2 Vaccination 04/26/2019, 05/17/2019, 12/12/2019  . Pneumococcal Conjugate-13 01/05/2016  . Zoster Recombinat (Shingrix) 08/11/2017, 12/17/2017     Objective: Vital Signs: There were no vitals taken for this visit.   Physical Exam   Musculoskeletal Exam: ***  CDAI Exam: CDAI Score: -- Patient Global: --; Provider Global: --  Swollen: --; Tender: -- Joint Exam 01/07/2022   No joint exam has been documented for this visit   There is currently no information documented on the homunculus. Go to the Rheumatology activity and complete the homunculus joint exam.  Investigation: No additional findings.  Imaging: No results found.  Recent Labs: Lab Results  Component Value Date   WBC 10.9 (H) 12/02/2021   HGB 14.9 12/02/2021   PLT 184 12/02/2021   NA 140 12/02/2021   K 4.5 12/02/2021   CL 106 12/02/2021   CO2 24 12/02/2021    GLUCOSE 83 12/02/2021   BUN 21 12/02/2021   CREATININE 0.74 12/02/2021   BILITOT 0.4 12/02/2021   ALKPHOS 44 10/12/2016   AST 19 12/02/2021   ALT 15 12/02/2021   PROT 7.0 12/02/2021   ALBUMIN 4.2 10/12/2016   CALCIUM 9.3 12/02/2021   GFRAA 87 06/25/2020   QFTBGOLDPLUS NEGATIVE 07/07/2017    Speciality Comments: PLQ Eye Exam: 10/14/2020 WNL @ [facility]. Follow up 1 year. Fosamax May 08, 2021  Procedures:  No procedures performed Allergies: Codeine, Flagyl [metronidazole hcl], Nexium [esomeprazole magnesium], Imuran [azathioprine], Sulfa antibiotics, and Sulfa drugs cross reactors   Assessment / Plan:     Visit Diagnoses: No diagnosis found.  Orders: No orders of the defined types were placed in this encounter.  No orders of the defined types were placed in this encounter.   Face-to-face time spent with patient was *** minutes. Greater than 50% of time was spent in counseling and coordination of care.  Follow-Up Instructions: No follow-ups on file.   [name], CMA  Note - This record has been created using Editor, commissioning.  Chart creation errors have been sought, but may not always  have been located. Such creation errors do not reflect on  the standard of medical care.

## 2022-01-06 ENCOUNTER — Telehealth: Payer: Self-pay | Admitting: Rheumatology

## 2022-01-06 NOTE — Telephone Encounter (Signed)
Reviewed the following with patient:   If you test POSITIVE for COVID19 and have MILD to MODERATE symptoms: First, call your PCP if you would like to receive COVID19 treatment AND Hold your medications during the infection and for at least 1 week after your symptoms have resolved: Injectable medication (Benlysta, Cimzia, Cosentyx, Enbrel, Humira, Orencia, Remicade, Simponi, Stelara, Taltz, Tremfya) Methotrexate Leflunomide (Arava) Azathioprine Mycophenolate (Cellcept) Roma Kayser, or Rinvoq Otezla If you take Actemra or Kevzara, you DO NOT need to hold these for COVID19 infection.  If you test POSITIVE for COVID19 and have NO symptoms: First, call your PCP if you would like to receive COVID19 treatment AND Hold your medications for at least 10 days after the day that you tested positive Injectable medication (Benlysta, Cimzia, Cosentyx, Enbrel, Humira, Orencia, Remicade, Simponi, Stelara, Taltz, Tremfya) Methotrexate Leflunomide (Arava) Azathioprine Mycophenolate (Cellcept) Roma Kayser, or Rinvoq Otezla If you take Actemra or Kevzara, you DO NOT need to hold these for COVID19 infection.

## 2022-01-06 NOTE — Telephone Encounter (Signed)
Patient called stating she tested positive for Covid yesterday, 01/05/22.  Patient requested a return call to let her know if she needs to discontinue Methotrexate.

## 2022-01-06 NOTE — Telephone Encounter (Signed)
Attempted to contact the patient and left message for patient to call the office.  

## 2022-01-07 ENCOUNTER — Ambulatory Visit: Payer: Medicare Other | Admitting: Rheumatology

## 2022-01-07 DIAGNOSIS — M3390 Dermatopolymyositis, unspecified, organ involvement unspecified: Secondary | ICD-10-CM

## 2022-01-07 DIAGNOSIS — M3501 Sicca syndrome with keratoconjunctivitis: Secondary | ICD-10-CM

## 2022-01-07 DIAGNOSIS — Z79899 Other long term (current) drug therapy: Secondary | ICD-10-CM

## 2022-01-07 DIAGNOSIS — M19042 Primary osteoarthritis, left hand: Secondary | ICD-10-CM

## 2022-01-07 DIAGNOSIS — Z87891 Personal history of nicotine dependence: Secondary | ICD-10-CM

## 2022-01-07 DIAGNOSIS — Z7952 Long term (current) use of systemic steroids: Secondary | ICD-10-CM

## 2022-01-07 DIAGNOSIS — D4989 Neoplasm of unspecified behavior of other specified sites: Secondary | ICD-10-CM

## 2022-01-07 DIAGNOSIS — Z8639 Personal history of other endocrine, nutritional and metabolic disease: Secondary | ICD-10-CM

## 2022-01-07 DIAGNOSIS — M170 Bilateral primary osteoarthritis of knee: Secondary | ICD-10-CM

## 2022-01-07 DIAGNOSIS — M810 Age-related osteoporosis without current pathological fracture: Secondary | ICD-10-CM

## 2022-01-14 ENCOUNTER — Emergency Department (HOSPITAL_BASED_OUTPATIENT_CLINIC_OR_DEPARTMENT_OTHER)
Admission: EM | Admit: 2022-01-14 | Discharge: 2022-01-14 | Discharge status: Against Medical Advice | Payer: Medicare Other | Attending: Emergency Medicine | Admitting: Emergency Medicine

## 2022-01-14 ENCOUNTER — Encounter (HOSPITAL_BASED_OUTPATIENT_CLINIC_OR_DEPARTMENT_OTHER): Payer: Self-pay | Admitting: Emergency Medicine

## 2022-01-14 ENCOUNTER — Other Ambulatory Visit: Payer: Self-pay

## 2022-01-14 DIAGNOSIS — Z5321 Procedure and treatment not carried out due to patient leaving prior to being seen by health care provider: Secondary | ICD-10-CM | POA: Diagnosis not present

## 2022-01-14 DIAGNOSIS — R6889 Other general symptoms and signs: Secondary | ICD-10-CM | POA: Diagnosis not present

## 2022-01-14 DIAGNOSIS — F410 Panic disorder [episodic paroxysmal anxiety] without agoraphobia: Secondary | ICD-10-CM | POA: Diagnosis not present

## 2022-01-14 DIAGNOSIS — R457 State of emotional shock and stress, unspecified: Secondary | ICD-10-CM | POA: Diagnosis not present

## 2022-01-14 DIAGNOSIS — I499 Cardiac arrhythmia, unspecified: Secondary | ICD-10-CM | POA: Diagnosis not present

## 2022-01-14 DIAGNOSIS — I10 Essential (primary) hypertension: Secondary | ICD-10-CM | POA: Diagnosis not present

## 2022-01-14 DIAGNOSIS — Z743 Need for continuous supervision: Secondary | ICD-10-CM | POA: Diagnosis not present

## 2022-01-14 DIAGNOSIS — R000 Tachycardia, unspecified: Secondary | ICD-10-CM | POA: Diagnosis present

## 2022-01-14 NOTE — ED Triage Notes (Signed)
Patient BIB [facility] c/o htn and tachycardia.  Patient reports she just got over covid and checked her BP today noting it was high.  Patient reports she then had a panic attack and her heart rate got high as well.  EMS reports VSS.

## 2022-01-14 NOTE — ED Notes (Signed)
Patient BIB [facility] from Home.  Per [facility], Patient recently diagnosed with COVID-19 and today the Patient was assessing her BP which was abnormally high. HR and BP continued to increase and Patient contacted EMS.  VSS with EMS and BP began to stabilize to [address].

## 2022-01-14 NOTE — ED Notes (Signed)
Per Registration, Patient LWBS from Waiting Area after Triage. Patient to be discharged accordingly.

## 2022-01-14 NOTE — ED Notes (Signed)
Informed by registation that this patient left after triage.

## 2022-01-16 NOTE — Progress Notes (Signed)
Office Visit Note  Patient: Marisa Orozco             Date of Birth: 1950-11-09           MRN: [id number]             PCP: Caryl Bis, MD Referring: Caryl Bis, MD Visit Date: 01/30/2022 Occupation: @GUAROCC @  Subjective:  Right knee pain  History of Present Illness: Marisa Orozco is a 71 y.o. 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  She denies any muscular weakness but continues to have some muscle pain.  She has been taking methotrexate, hydroxychloroquine on a regular basis.  She is also on prednisone 3 mg p.o. daily.  She is unable to lower her dose further.  She had right knee joint injection at the last visit in June 2023.  She states she still have some stiffness and discomfort in her right knee joint.  She has intermittent pain in her left knee joint.  None of the other joints are painful.  She has been under a lot of stress and having anxiety.  Her blood pressure has been elevated.  She also complains of Raynaud's phenomenon in her hands.  She continues to have dry mouth and dry eyes.  She has been using over-the-counter products which help.  Activities of Daily Living:  Patient reports morning stiffness for 0 minutes.   Patient Denies nocturnal pain.  Difficulty dressing/grooming: Denies Difficulty climbing stairs: Reports Difficulty getting out of chair: Denies Difficulty using hands for taps, buttons, cutlery, and/or writing: Denies  Review of Systems  Constitutional:  Negative for fatigue.  HENT:  Positive for mouth sores and mouth dryness.   Eyes:  Positive for dryness.  Respiratory:  Negative for shortness of breath.   Cardiovascular:  Negative for chest pain and palpitations.  Gastrointestinal:  Negative for blood in stool, constipation and diarrhea.  Endocrine: Negative for increased urination.  Genitourinary:  Negative for involuntary urination.  Musculoskeletal:  Positive for joint pain, joint pain, myalgias, muscle weakness, muscle tenderness and  myalgias. Negative for gait problem, joint swelling and morning stiffness.  Skin:  Positive for color change. Negative for rash, hair loss and sensitivity to sunlight.  Allergic/Immunologic: Negative for susceptible to infections.  Neurological:  Negative for dizziness and headaches.  Hematological:  Negative for swollen glands.  Psychiatric/Behavioral:  Positive for sleep disturbance. Negative for depressed mood. The patient is nervous/anxious.     PMFS History:  Patient Active Problem List   Diagnosis Date Noted   Palpitations 01/15/2021   Hypothyroidism 07/26/2019   Primary osteoarthritis of right knee 02/03/2018   Primary osteoarthritis of both hands 09/08/2017   Sjogren's disease ([place]) 02/04/2016   Dermatomyositis ([place]) 02/04/2016   Former smoker 02/04/2016   Thymoma 02/04/2016   Fatigue 02/15/2012    Past Medical History:  Diagnosis Date   Allergy    Anxiety    Cancer ([employer])    basal cell carcinoma - face   Dermatomyositis ([place]) 02/04/2016   Jo 1 Positive  Skin Bx- Subtle interface Dermatitis    Former smoker 02/04/2016   GERD (gastroesophageal reflux disease)    otc med prn   PONV (postoperative nausea and vomiting)    Raynaud's disease    Sjogren's disease ([place]) 02/04/2016   Positive ANA, Positive Ro, Positive CCP, Negative RF, Parotitis, Positive Sicca   Sjogren's syndrome ([place])    from MD note    Family History  Problem Relation Age of Onset   COPD Mother  Heart disease Mother    Diabetes Father    Kidney disease Father    Epilepsy Daughter    Rectal cancer Maternal Uncle    Esophageal cancer Neg Hx    Stomach cancer Neg Hx    Ulcerative colitis Neg Hx    Past Surgical History:  Procedure Laterality Date   BREAST SURGERY     augmentation   BUNIONECTOMY     right foot   COLONOSCOPY     DIAGNOSTIC LAPAROSCOPY     DILATION AND CURETTAGE OF UTERUS  04/15/2011   Procedure: DILATATION AND CURETTAGE;  Surgeon: [name];  Location: [facility];  Service:  Gynecology;  Laterality: N/A;   IR RADIOLOGIST EVAL & MGMT  02/04/2021   MUSCLE BIOPSY Left 01/02/2016   Procedure: MUSCLE BIOPSY LEFT DELTOID;  Surgeon: [name], MD;  Location: [facility];  Service: General;  Laterality: Left;  left deltoid muscle bx   NASAL SEPTUM SURGERY     SVD     x 2   TONSILLECTOMY     WISDOM TOOTH EXTRACTION     Social History   Social History Narrative   Not on file   Immunization History  Administered Date(s) Administered   Influenza, High Dose Seasonal PF 01/07/2018   Influenza-Unspecified 01/04/2014   PFIZER(Purple Top)SARS-COV-2 Vaccination 04/26/2019, 05/17/2019, 12/12/2019   Pneumococcal Conjugate-13 01/05/2016   Zoster Recombinat (Shingrix) 08/11/2017, 12/17/2017     Objective: Vital Signs: BP 134/73 (BP Location: Left Arm, Patient Position: Sitting, Cuff Size: Normal)   Pulse 90   Resp 15   Ht 5\' 6"  (1.676 m)   Wt 167 lb 9.6 oz (76 kg)   BMI 27.05 kg/m    Physical Exam Vitals and nursing note reviewed.  Constitutional:      Appearance: She is well-developed.  HENT:     Head: Normocephalic and atraumatic.  Eyes:     Conjunctiva/sclera: Conjunctivae normal.  Cardiovascular:     Rate and Rhythm: Normal rate and regular rhythm.     Heart sounds: Normal heart sounds.  Pulmonary:     Effort: Pulmonary effort is normal.     Breath sounds: Normal breath sounds.  Abdominal:     General: Bowel sounds are normal.     Palpations: Abdomen is soft.  Musculoskeletal:     Cervical back: Normal range of motion.  Lymphadenopathy:     Cervical: No cervical adenopathy.  Skin:    General: Skin is warm and dry.     Capillary Refill: Capillary refill takes 2 to 3 seconds.  Neurological:     Mental Status: She is alert and oriented to person, place, and time.  Psychiatric:        Behavior: Behavior normal.      Musculoskeletal Exam: Cervical spine was in good range of motion.  Shoulder joints, elbow joints, wrist joints,  MCPs PIPs and DIPs with good range of motion with no synovitis.  Hip joints and knee joints in good range of motion.  She has some warmth and swelling in her right knee joint.  Left knee joint was in good range of motion.  There was no tenderness over ankles or MTPs.  CDAI Exam: CDAI Score: -- Patient Global: --; Provider Global: -- Swollen: --; Tender: -- Joint Exam 01/30/2022   No joint exam has been documented for this visit   There is currently no information documented on the homunculus. Go to the Rheumatology activity and complete the homunculus joint exam.  Investigation:  No additional findings.  Imaging: XR KNEE 3 VIEW RIGHT  Result Date: 01/30/2022 Severe medial compartment narrowing with intercondylar and medial osteophytes was noted.  No chondrocalcinosis was noted.  Moderate patellofemoral narrowing was noted.  Radiographic progression was noted when compared to the x-rays of 2019. Impression: These findings are consistent with severe osteoarthritis and moderate chondromalacia patella.   Recent Labs: Lab Results  Component Value Date   WBC 10.9 (H) 12/02/2021   HGB 14.9 12/02/2021   PLT 184 12/02/2021   NA 140 12/02/2021   K 4.5 12/02/2021   CL 106 12/02/2021   CO2 24 12/02/2021   GLUCOSE 83 12/02/2021   BUN 21 12/02/2021   CREATININE 0.74 12/02/2021   BILITOT 0.4 12/02/2021   ALKPHOS 44 10/12/2016   AST 19 12/02/2021   ALT 15 12/02/2021   PROT 7.0 12/02/2021   ALBUMIN 4.2 10/12/2016   CALCIUM 9.3 12/02/2021   GFRAA 87 06/25/2020   QFTBGOLDPLUS NEGATIVE 07/07/2017    Speciality Comments: PLQ Eye Exam: 10/14/2020 WNL @ [facility]. Follow up 1 year. Fosamax May 08, 2021  Procedures:  No procedures performed Allergies: Codeine, Flagyl [metronidazole hcl], Nexium [esomeprazole magnesium], Imuran [azathioprine], Sulfa antibiotics, and Sulfa drugs cross reactors   Assessment / Plan:     Visit Diagnoses: Dermatomyositis ([place]) -  Jo 1+,  Positive muscle biopsy for dermatomyositis: She denies any increased muscular weakness or tenderness.  She continues to have some generalized muscle achiness.  She is unable to taper prednisone below 3 mg p.o. daily.  She has been taking methotrexate and Plaquenil on a regular basis.  She had good muscle strength in her upper and lower extremities.  High risk medication use - MTX 0.8 ml every 7 days, folic acid 2mg  po qd, Plaquenil 200 mg BID M-F,prednisone 3.0 mg every day. PLQ Eye Exam: 10/14/2020.  December 02, 2021 CBC showed WBC 10.9 due to prednisone use.  CMP was normal.  CK1 03.  She was advised to get labs every 3 months to monitor for drug toxicity.  Information on immunization was placed in the AVS.  She was advised to hold methotrexate if she develops an infection and resume after the infection resolves.  Sjogren's syndrome with keratoconjunctivitis sicca (HCC)-she continues to have sicca symptoms.  Over-the-counter products were discussed.  I discussed possible use of pilocarpine but she declined.  Long term (current) use of systemic steroids-she understands the side effects of long-term use of prednisone.  Primary osteoarthritis of both hands-she had bilateral PIP and DIP thickening which causes chronic discomfort.  Joint protection muscle strengthening was discussed.  Chronic pain of right knee -she has been having pain and discomfort in her right knee joint for the last few months.  She had a cortisone injection in her right knee joint at the last visit.  She still feels some tightness in her knee joint.  On examination she had some fullness in her right knee.  Plan: XR KNEE 3 VIEW RIGHT.  X-rays today showed severe medial compartment narrowing with medial osteophytes and intercondylar osteophytes.  Moderate chondromalacia patella was noted.  Radiographic progression was noted when compared to the x-rays of 2019.  Patient is planning to see Dr. Ninfa Linden.  She does not want to have surgery.  I  discussed the option of viscosupplement injections.  She will think about it.  I will refer her to physical therapy for lower extremity muscle strengthening.  Primary osteoarthritis of both knees-she has osteoarthritis involving bilateral knee joints  and has chronic discomfort.  Raynaud's disease without gangrene-she gives history of Raynaud's phenomenon.  She decreased capillary refill.  No nailbed capillary changes were noted.  I will place her on amlodipine 5 mg p.o. daily.  Side effects of amlodipine were discussed.  Elevated blood pressure reading-blood pressure was elevated at 164/91.  After resting it came down to 134/73.  Anxiety-she has been experiencing increased anxiety.  Age-related osteoporosis without current pathological fracture - DEXA on 06/02/2018: AP spine T score -2.7 with BMD 0.95.  -15.2% change from previous DEXA.  Patient states that she had a repeat DEXA with a T score of -2.2.  Calcium rich diet was discussed.  History of vitamin D deficiency-patient is on vitamin D supplement.  Thymoma  Former smoker  Orders: Orders Placed This Encounter  Procedures   XR KNEE 3 VIEW RIGHT   Meds ordered this encounter  Medications   amLODipine (NORVASC) 5 MG tablet    Sig: Take 1 tablet (5 mg total) by mouth daily.    Dispense:  30 tablet    Refill:  2     Follow-Up Instructions: Return in about 3 months (around 05/02/2022) for Dermatomyositis, Sjogren's, Osteoarthritis, Osteoporosis.   Bo Merino, MD  Note - This record has been created using Editor, commissioning.  Chart creation errors have been sought, but may not always  have been located. Such creation errors do not reflect on  the standard of medical care.

## 2022-01-21 ENCOUNTER — Encounter: Payer: Self-pay | Admitting: Rheumatology

## 2022-01-23 DIAGNOSIS — Z23 Encounter for immunization: Secondary | ICD-10-CM | POA: Diagnosis not present

## 2022-01-30 ENCOUNTER — Encounter: Payer: Self-pay | Admitting: Rheumatology

## 2022-01-30 ENCOUNTER — Ambulatory Visit: Payer: Medicare Other | Attending: Rheumatology | Admitting: Rheumatology

## 2022-01-30 ENCOUNTER — Ambulatory Visit (INDEPENDENT_AMBULATORY_CARE_PROVIDER_SITE_OTHER): Payer: Medicare Other

## 2022-01-30 VITALS — BP 134/73 | HR 90 | Resp 15 | Ht 66.0 in | Wt 167.6 lb

## 2022-01-30 DIAGNOSIS — M3501 Sicca syndrome with keratoconjunctivitis: Secondary | ICD-10-CM

## 2022-01-30 DIAGNOSIS — M19041 Primary osteoarthritis, right hand: Secondary | ICD-10-CM | POA: Diagnosis not present

## 2022-01-30 DIAGNOSIS — Z7952 Long term (current) use of systemic steroids: Secondary | ICD-10-CM

## 2022-01-30 DIAGNOSIS — R030 Elevated blood-pressure reading, without diagnosis of hypertension: Secondary | ICD-10-CM

## 2022-01-30 DIAGNOSIS — D4989 Neoplasm of unspecified behavior of other specified sites: Secondary | ICD-10-CM | POA: Diagnosis not present

## 2022-01-30 DIAGNOSIS — Z87891 Personal history of nicotine dependence: Secondary | ICD-10-CM | POA: Diagnosis not present

## 2022-01-30 DIAGNOSIS — Z79899 Other long term (current) drug therapy: Secondary | ICD-10-CM | POA: Diagnosis not present

## 2022-01-30 DIAGNOSIS — M25561 Pain in right knee: Secondary | ICD-10-CM

## 2022-01-30 DIAGNOSIS — M170 Bilateral primary osteoarthritis of knee: Secondary | ICD-10-CM | POA: Diagnosis not present

## 2022-01-30 DIAGNOSIS — I7300 Raynaud's syndrome without gangrene: Secondary | ICD-10-CM | POA: Diagnosis not present

## 2022-01-30 DIAGNOSIS — G8929 Other chronic pain: Secondary | ICD-10-CM | POA: Diagnosis not present

## 2022-01-30 DIAGNOSIS — M3390 Dermatopolymyositis, unspecified, organ involvement unspecified: Secondary | ICD-10-CM

## 2022-01-30 DIAGNOSIS — M810 Age-related osteoporosis without current pathological fracture: Secondary | ICD-10-CM

## 2022-01-30 DIAGNOSIS — Z8639 Personal history of other endocrine, nutritional and metabolic disease: Secondary | ICD-10-CM

## 2022-01-30 DIAGNOSIS — M19042 Primary osteoarthritis, left hand: Secondary | ICD-10-CM | POA: Diagnosis not present

## 2022-01-30 DIAGNOSIS — F419 Anxiety disorder, unspecified: Secondary | ICD-10-CM | POA: Diagnosis not present

## 2022-01-30 MED ORDER — AMLODIPINE BESYLATE 5 MG PO TABS: 5.0000 mg | ORAL_TABLET | Freq: Every day | ORAL | 2 refills | Status: DC

## 2022-01-30 NOTE — Addendum Note (Signed)
Addended by: Shona Needles on: 01/30/2022 01:42 PM   Modules accepted: Orders

## 2022-01-30 NOTE — Patient Instructions (Signed)
Standing Labs We placed an order today for your standing lab work.   Please have your standing labs drawn in November and every 3 months  Please have your labs drawn 2 weeks prior to your appointment so that the provider can discuss your lab results at your appointment.  Please note that you may see your imaging and lab results in [facility] before we have reviewed them. We will contact you once all results are reviewed. Please allow our office up to 72 hours to thoroughly review all of the results before contacting the office for clarification of your results.  Lab hours are:   Monday through Thursday from 8:00 am -12:30 pm and 1:00 pm-5:00 pm and Friday from 8:00 am-12:00 pm.  Please be advised, all patients with office appointments requiring lab work will take precedent over walk-in lab work.   Labs are drawn by [facility]. Please bring your co-pay at the time of your lab draw.  You may receive a bill from [facility] for your lab work.  Please note if you are on Hydroxychloroquine and and an order has been placed for a Hydroxychloroquine level, you will need to have it drawn 4 hours or more after your last dose.  If you wish to have your labs drawn at another location, please call the office 24 hours in advance so we can fax the orders.  The office is located at [address] [place], [place], [place] [address] No appointment is necessary.    If you have any questions regarding directions or hours of operation,  please call [phone].   As a reminder, please drink plenty of water prior to coming for your lab work. Thanks!   If you have signs or symptoms of an infection or start antibiotics: First, call your PCP for workup of your infection. Hold your medication through the infection, until you complete your antibiotics, and until symptoms resolve if you take the following: Injectable medication (Actemra, Benlysta, Cimzia, Cosentyx, Enbrel, Humira, Kevzara, Orencia, Remicade, Simponi,  Stelara, Taltz, Tremfya) Methotrexate Leflunomide (Arava) Mycophenolate (Cellcept) Morrie Sheldon, Olumiant, or Rinvoq  Vaccines You are taking a medication(s) that can suppress your immune system.  The following immunizations are recommended: Flu annually Covid-19  Td/Tdap (tetanus, diphtheria, pertussis) every 10 years Pneumonia (Prevnar 15 then Pneumovax 23 at least 1 year apart.  Alternatively, can take Prevnar 20 without needing additional dose) Shingrix: 2 doses from 4 weeks to 6 months apart  Please check with your PCP to make sure you are up to date.

## 2022-01-31 DIAGNOSIS — F410 Panic disorder [episodic paroxysmal anxiety] without agoraphobia: Secondary | ICD-10-CM | POA: Diagnosis not present

## 2022-01-31 DIAGNOSIS — R030 Elevated blood-pressure reading, without diagnosis of hypertension: Secondary | ICD-10-CM | POA: Diagnosis not present

## 2022-01-31 DIAGNOSIS — Z6827 Body mass index (BMI) 27.0-27.9, adult: Secondary | ICD-10-CM | POA: Diagnosis not present

## 2022-02-03 DIAGNOSIS — E782 Mixed hyperlipidemia: Secondary | ICD-10-CM | POA: Diagnosis not present

## 2022-02-03 DIAGNOSIS — M810 Age-related osteoporosis without current pathological fracture: Secondary | ICD-10-CM | POA: Diagnosis not present

## 2022-02-03 DIAGNOSIS — E119 Type 2 diabetes mellitus without complications: Secondary | ICD-10-CM | POA: Diagnosis not present

## 2022-02-09 ENCOUNTER — Encounter: Payer: Self-pay | Admitting: Orthopaedic Surgery

## 2022-02-09 ENCOUNTER — Ambulatory Visit (INDEPENDENT_AMBULATORY_CARE_PROVIDER_SITE_OTHER): Payer: Medicare Other | Admitting: Orthopaedic Surgery

## 2022-02-09 DIAGNOSIS — M25561 Pain in right knee: Secondary | ICD-10-CM

## 2022-02-09 DIAGNOSIS — M1711 Unilateral primary osteoarthritis, right knee: Secondary | ICD-10-CM | POA: Diagnosis not present

## 2022-02-09 DIAGNOSIS — G8929 Other chronic pain: Secondary | ICD-10-CM | POA: Diagnosis not present

## 2022-02-09 NOTE — Progress Notes (Addendum)
The patient is a very pleasant, active and young appearing 71 year old female who comes in for continued evaluation and treatment of right knee pain.  She has pretty good mobility and is a regular community ambulator with no significant issues as a relates to her ambulation other than the significance of her knee pain.  She has had left knee issues in the past and x-rays of her right knee years ago showed a well-maintained joint space.  With time her right knee is starting to bone more with varus malalignment.  She is getting medial joint line tenderness.  At times the knee is quite painful to her.  She is a thin individual.  She has been set up for outpatient physical [facility] and that is in the works.  She does have appropriate questions as a relates to all treatment modalities to avoid knee replacement surgery.  She is still not interested in any type of replacement surgery.  She has not had conservative treatment such as physical therapy or any type of offloading brace.  She has not had any other injections either.  Examination of her right knee shows varus malalignment that is correctable.  There is medial joint line tenderness and some patellofemoral crepitation but excellent range of motion of the knee.  There is slight instability with varus stressing of her right knee.  X-rays of her right knee show worsening varus malalignment and medial joint space narrowing.  The medial narrowing is gotten more significant over the last 2 years.  There is also patellofemoral narrowing.  She is a good candidate for a right knee osteoarthritis offloading brace for the medial compartment.  I agree with her attending physical therapy.  I will then see her back in about 6 weeks to see how she is doing and then we can talk about the possibility of hyaluronic acid at that standpoint.  She agrees with this treatment plan.  All question concerns were answered and addressed.

## 2022-02-11 ENCOUNTER — Encounter: Payer: Self-pay | Admitting: *Deleted

## 2022-02-11 ENCOUNTER — Other Ambulatory Visit: Payer: Self-pay | Admitting: Physician Assistant

## 2022-02-11 DIAGNOSIS — M3501 Sicca syndrome with keratoconjunctivitis: Secondary | ICD-10-CM

## 2022-02-11 NOTE — Telephone Encounter (Signed)
Next Visit: 05/15/2022  Last Visit: 01/30/2022  Labs: 12/02/2021 WBC 10.9  Eye exam: 10/14/2020 WNL    Current Dose per office note 01/30/2022: Plaquenil 200 mg BID M-F   HS:[id number]   Last Fill: 09/22/2021  Okay to refill Plaquenil?

## 2022-02-23 DIAGNOSIS — H40053 Ocular hypertension, bilateral: Secondary | ICD-10-CM | POA: Diagnosis not present

## 2022-02-23 DIAGNOSIS — H2513 Age-related nuclear cataract, bilateral: Secondary | ICD-10-CM | POA: Diagnosis not present

## 2022-02-23 DIAGNOSIS — H43812 Vitreous degeneration, left eye: Secondary | ICD-10-CM | POA: Diagnosis not present

## 2022-02-23 DIAGNOSIS — M3500 Sicca syndrome, unspecified: Secondary | ICD-10-CM | POA: Diagnosis not present

## 2022-02-23 DIAGNOSIS — G43B0 Ophthalmoplegic migraine, not intractable: Secondary | ICD-10-CM | POA: Diagnosis not present

## 2022-02-23 DIAGNOSIS — Z79899 Other long term (current) drug therapy: Secondary | ICD-10-CM | POA: Diagnosis not present

## 2022-03-02 ENCOUNTER — Other Ambulatory Visit: Payer: Self-pay

## 2022-03-02 ENCOUNTER — Encounter (HOSPITAL_BASED_OUTPATIENT_CLINIC_OR_DEPARTMENT_OTHER): Payer: Self-pay | Admitting: Physical Therapy

## 2022-03-02 ENCOUNTER — Ambulatory Visit (HOSPITAL_BASED_OUTPATIENT_CLINIC_OR_DEPARTMENT_OTHER): Payer: Medicare Other | Attending: Rheumatology | Admitting: Physical Therapy

## 2022-03-02 DIAGNOSIS — M25562 Pain in left knee: Secondary | ICD-10-CM | POA: Insufficient documentation

## 2022-03-02 DIAGNOSIS — M25561 Pain in right knee: Secondary | ICD-10-CM | POA: Diagnosis not present

## 2022-03-02 DIAGNOSIS — R262 Difficulty in walking, not elsewhere classified: Secondary | ICD-10-CM | POA: Diagnosis not present

## 2022-03-02 DIAGNOSIS — G8929 Other chronic pain: Secondary | ICD-10-CM | POA: Diagnosis not present

## 2022-03-02 DIAGNOSIS — M6281 Muscle weakness (generalized): Secondary | ICD-10-CM | POA: Insufficient documentation

## 2022-03-02 DIAGNOSIS — M170 Bilateral primary osteoarthritis of knee: Secondary | ICD-10-CM | POA: Insufficient documentation

## 2022-03-02 NOTE — Therapy (Signed)
OUTPATIENT PHYSICAL THERAPY LOWER EXTREMITY EVALUATION   Patient Name: Marisa Orozco MRN: [id number] DOB:22-Aug-1950, 71 y.o., female Today's Date: 03/02/2022  END OF SESSION:  PT End of Session - 03/02/22 1737     Visit Number 1    Number of Visits 13    Date for PT Re-Evaluation 05/31/22    Authorization Type MCR    PT Start Time 7564    PT Stop Time 1600    PT Time Calculation (min) 45 min    Activity Tolerance Patient tolerated treatment well    Behavior During Therapy [facility] for tasks assessed/performed             Past Medical History:  Diagnosis Date   Allergy    Anxiety    Cancer ([place])    basal cell carcinoma - face   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   [name] 1 Positive  Skin Bx- Subtle interface Dermatitis    Former smoker 02/04/2016   GERD (gastroesophageal reflux disease)    otc med prn   PONV (postoperative nausea and vomiting)    Raynaud's disease    Sjogren's disease ([place]) 02/04/2016   Positive ANA, Positive Ro, Positive CCP, Negative RF, Parotitis, Positive Sicca   Sjogren's syndrome ([place])    from MD note   Past Surgical History:  Procedure Laterality Date   BREAST SURGERY     augmentation   BUNIONECTOMY     right foot   COLONOSCOPY     DIAGNOSTIC LAPAROSCOPY     DILATION AND CURETTAGE OF UTERUS  04/15/2011   Procedure: DILATATION AND CURETTAGE;  Surgeon: [name];  Location: [facility];  Service: Gynecology;  Laterality: N/A;   IR RADIOLOGIST EVAL & MGMT  02/04/2021   MUSCLE BIOPSY Left 01/02/2016   Procedure: MUSCLE BIOPSY LEFT DELTOID;  Surgeon: [name], MD;  Location: [place];  Service: General;  Laterality: Left;  left deltoid muscle bx   NASAL SEPTUM SURGERY     SVD     x 2   TONSILLECTOMY     WISDOM TOOTH EXTRACTION     Patient Active Problem List   Diagnosis Date Noted   Palpitations 01/15/2021   Hypothyroidism 07/26/2019   Unilateral primary osteoarthritis, right knee 02/03/2018   Primary osteoarthritis of  both hands 09/08/2017   Sjogren's disease ([place]) 02/04/2016   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   Former smoker 02/04/2016   Thymoma 02/04/2016   Fatigue 02/15/2012    PCP: Caryl Bis, MD   REFERRING PROVIDER: Bo Merino, MD   REFERRING DIAG:  M17.0 (ICD-10-CM) - Primary osteoarthritis of both knees  M25.561,G89.29 (ICD-10-CM) - Chronic pain of right knee    THERAPY DIAG:  Chronic pain of both knees - Plan: PT plan of care cert/re-cert  Difficulty walking - Plan: PT plan of care cert/re-cert  Muscle weakness (generalized) - Plan: PT plan of care cert/re-cert  Rationale for Evaluation and Treatment: Rehabilitation  ONSET DATE: June 2023  SUBJECTIVE:   SUBJECTIVE STATEMENT: Pt reports she has a history of dermatomyositis and a flare that happened in 2019 that causes proximal hip weakness. Pt reports pain into bilateral knees. She denies any increased muscular weakness or tenderness.  She continues to have some generalized muscle achiness. she has been having pain and discomfort in her right knee joint for the last few months.  She had two cortisone injection in her right knee in June 2023.   Pt states the R knee will swell and she cannot straighten it.  She states it feels like generalized pressure. She will sometimes use a walking stick when swelling occurs. Pt denies feeling popping, clicking grinding in the knee but does have it in the R hip. Does not feel it on the L. Pt does wear a knee compression sleeve about 1-2x week. 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 She does not require maxA transfers but does have to help with bed, toileting, and daily mobility. Pt is limited with standing/cooking at home. Pt denies cancer red flag question.   Pt has a knee brace on the way from the MD due to the alignment.   Exercise: no longer able to due to pain  PERTINENT HISTORY: Dermatomyositis, L tibial stress fracture in 2022  PAIN:  Are you having pain? no:  NPRS scale: 0/10; 8/10 at its worst Pain location: patellar poles, posterior aspect of knees of the R knee; L can feel similar Pain description:  sharp Aggravating factors: stairs, walking, squatting, swelling, turning too quickly Relieving factors: walking stick, reduction of swelling, bending the knee/pulling to the chest   PRECAUTIONS: None  WEIGHT BEARING RESTRICTIONS: No  FALLS:  Has patient fallen in last 6 months? 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  No chondrocalcinosis was noted.  Moderate  patellofemoral narrowing was noted.  Radiographic progression was noted  when compared to the x-rays of 2019.   Impression: These findings are consistent with severe osteoarthritis and  moderate chondromalacia patella.   PATIENT SURVEYS:  FOTO 44 58 @ DC 12 pts MCII  COGNITION: Overall cognitive status: Within functional limits for tasks assessed     SENSATION: WFL  EDEMA: moderate edema around medial and lateral patellar pouches   POSTURE:  tibial varum and genu varus in standing and with walking  PALPATION: No TTP noted today  LOWER EXTREMITY ROM:  Active ROM Right eval Left eval  Hip flexion 110 110  Hip extension 5 5  Hip abduction    Hip adduction    Hip internal rotation 30 30  Hip external rotation 40 40  Knee flexion 120 120  Knee extension -3 0   (Blank rows = not tested)  LOWER EXTREMITY MMT:  MMT Right eval Left eval  Hip flexion 4/5 4/5  Hip extension    Hip abduction 4/5 4/5  Knee flexion 4/5 4/5  Knee extension 4/5 4/5    (Blank rows = not tested)  LOWER EXTREMITY SPECIAL TESTS:  Knee special tests: Anterior drawer test: negative, Posterior drawer test: negative, and varus/valgus at 0 and 20 negative  FUNCTIONAL TESTS:  5 times sit to stand: 14s  GAIT: Distance walked: 58ft Assistive device utilized: None Level of assistance: Complete Independence Comments: genu varus bilat, antalgic, decreased R stance time   TODAY'S TREATMENT:  DATE: 11/27   Exercises - Sit to Stand with Resistance Around Legs  - 1 x daily - 5 x weekly - 3 sets - 10 reps - Side Stepping with Resistance at Thighs  - 1 x daily - 5 x weekly - 1 sets - 3 reps - 22ft hold - Sitting Knee Extension with Resistance  - 1 x daily - 5 x weekly - 2 sets - 10 reps   PATIENT EDUCATION:  Education details: MOI, diagnosis, prognosis, anatomy, exercise progression, DOMS expectations, muscle firing,  envelope of function, HEP, POC  Person educated: Patient Education method: Explanation, Demonstration, Tactile cues, Verbal cues, and Handouts Education comprehension: verbalized understanding and returned demonstration  HOME EXERCISE PROGRAM:  Access Code: [email or web address] URL: [email or web address] Date: 03/02/2022 Prepared by: Daleen Bo  ASSESSMENT:  CLINICAL IMPRESSION:  Patient is a 71 y.o. female who was seen today for physical therapy evaluation and treatment for chief complaint of bilateral knee pain.  Patient's history of dermatomyositis and proximal hip hip and thigh weakness likely contributing factors to current knee pain.  Patient with limited functional mobility, endurance, and tolerance to community ambulation due to LE weakness.  Patient's pain is moderately sensitive and irritable with movement at today's session. patient is largely strength limited at this time.  Plan to continue with progression of  quad hamstring and proximal hip strength as tolerated.  Of note, patient does have more genu varus which affects her medial knee compartment.  Patient will benefit from continued skilled therapy in order to address functional bilateral lower extremity strength and endurance deficits return to previous level of function and exercise.   OBJECTIVE IMPAIRMENTS decreased mobility, difficulty walking, decreased ROM, decreased strength, increased muscle spasms, improper body mechanics, postural dysfunction, and pain.    ACTIVITY LIMITATIONS lifting, sitting, standing, squatting, stairs, transfers, locomotion level   PARTICIPATION LIMITATIONS: cleaning, laundry, driving, shopping, community activity, occupation, yard work, and exercise   PERSONAL FACTORS Age, Fitness, Time since onset of injury/illness/exacerbation, and 1-3comorbidity:    are also affecting patient's functional outcome.    REHAB POTENTIAL: Fair   CLINICAL DECISION MAKING: Stable/uncomplicated   EVALUATION COMPLEXITY: Low     GOALS:     SHORT TERM GOALS: Target date: 04/13/2022  Pt will become independent with HEP in order to demonstrate synthesis of PT education..   Goal status: INITIAL   2.  Pt will be able to demonstrate descending stair pattern without pain in order to demonstrate functional improvement in LE strength and house hold mobility.    Goal status: INITIAL   3.  Pt will be able to demonstrate/report ability to sit/stand/sleep for extended periods of time without pain in order to demonstrate functional improvement and tolerance to static positioning.    Goal status: INITIAL     LONG TERM GOALS: Target date: 05/25/2022    Pt  will become independent with final HEP in order to demonstrate synthesis of PT education.   Goal status: INITIAL   2.  Pt will score >/= 58 on FOTO to demonstrate improvement in perceived bilat knee function.    Goal status: INITIAL   3.  Pt will be able to demonstrate full depth  squat without pain in order to demonstrate functional improvement in LE function for self-care and house hold duties. .    Goal status: INITIAL   4.  Pt will be able to demonstrate/report ability to walk >25 mins without pain in order to demonstrate functional improvement and tolerance to exercise and community mobility.  Goal status: INITIAL       PLAN: PT FREQUENCY: 1-2x/week   PT DURATION: 12 weeks (likely DC in 8)   PLANNED INTERVENTIONS: Therapeutic exercises, Therapeutic activity, Neuromuscular re-education, Balance training, Gait training, Patient/Family education, Self Care, Joint mobilization, Stair training, Aquatic Therapy, Dry Needling, Electrical stimulation, Spinal mobilization, Cryotherapy, Moist heat, Taping, Ultrasound, Ionotophoresis 4mg /ml Dexamethasone, and Manual therapy    PLAN FOR NEXT SESSION: continue with quad and hip strength, check HS, fig 4 bridge holds; edu about neutral footwear to promote better knee alignment   Daleen Bo, PT 03/02/2022, 5:55 PM

## 2022-03-08 DIAGNOSIS — R0683 Snoring: Secondary | ICD-10-CM | POA: Diagnosis not present

## 2022-03-08 DIAGNOSIS — G4763 Sleep related bruxism: Secondary | ICD-10-CM | POA: Diagnosis not present

## 2022-03-08 DIAGNOSIS — R519 Headache, unspecified: Secondary | ICD-10-CM | POA: Diagnosis not present

## 2022-03-08 DIAGNOSIS — G4700 Insomnia, unspecified: Secondary | ICD-10-CM | POA: Diagnosis not present

## 2022-03-08 DIAGNOSIS — G4710 Hypersomnia, unspecified: Secondary | ICD-10-CM | POA: Diagnosis not present

## 2022-03-08 DIAGNOSIS — G478 Other sleep disorders: Secondary | ICD-10-CM | POA: Diagnosis not present

## 2022-03-08 DIAGNOSIS — R0989 Other specified symptoms and signs involving the circulatory and respiratory systems: Secondary | ICD-10-CM | POA: Diagnosis not present

## 2022-03-11 DIAGNOSIS — F410 Panic disorder [episodic paroxysmal anxiety] without agoraphobia: Secondary | ICD-10-CM | POA: Diagnosis not present

## 2022-03-11 DIAGNOSIS — R7301 Impaired fasting glucose: Secondary | ICD-10-CM | POA: Diagnosis not present

## 2022-03-11 DIAGNOSIS — Z6826 Body mass index (BMI) 26.0-26.9, adult: Secondary | ICD-10-CM | POA: Diagnosis not present

## 2022-03-11 DIAGNOSIS — E7849 Other hyperlipidemia: Secondary | ICD-10-CM | POA: Diagnosis not present

## 2022-03-11 DIAGNOSIS — G43109 Migraine with aura, not intractable, without status migrainosus: Secondary | ICD-10-CM | POA: Diagnosis not present

## 2022-03-11 DIAGNOSIS — E8881 Metabolic syndrome: Secondary | ICD-10-CM | POA: Diagnosis not present

## 2022-03-11 DIAGNOSIS — K7581 Nonalcoholic steatohepatitis (NASH): Secondary | ICD-10-CM | POA: Diagnosis not present

## 2022-03-11 DIAGNOSIS — I700 Atherosclerosis of aorta: Secondary | ICD-10-CM | POA: Diagnosis not present

## 2022-03-11 DIAGNOSIS — M3390 Dermatopolymyositis, unspecified, organ involvement unspecified: Secondary | ICD-10-CM | POA: Diagnosis not present

## 2022-03-11 DIAGNOSIS — C37 Malignant neoplasm of thymus: Secondary | ICD-10-CM | POA: Diagnosis not present

## 2022-03-11 DIAGNOSIS — M1712 Unilateral primary osteoarthritis, left knee: Secondary | ICD-10-CM | POA: Diagnosis not present

## 2022-03-11 DIAGNOSIS — R030 Elevated blood-pressure reading, without diagnosis of hypertension: Secondary | ICD-10-CM | POA: Diagnosis not present

## 2022-03-13 ENCOUNTER — Encounter (HOSPITAL_BASED_OUTPATIENT_CLINIC_OR_DEPARTMENT_OTHER): Payer: Self-pay | Admitting: Physical Therapy

## 2022-03-13 ENCOUNTER — Ambulatory Visit (HOSPITAL_BASED_OUTPATIENT_CLINIC_OR_DEPARTMENT_OTHER): Payer: Medicare Other | Attending: Rheumatology | Admitting: Physical Therapy

## 2022-03-13 DIAGNOSIS — G8929 Other chronic pain: Secondary | ICD-10-CM | POA: Diagnosis not present

## 2022-03-13 DIAGNOSIS — M25561 Pain in right knee: Secondary | ICD-10-CM | POA: Diagnosis not present

## 2022-03-13 DIAGNOSIS — R262 Difficulty in walking, not elsewhere classified: Secondary | ICD-10-CM | POA: Insufficient documentation

## 2022-03-13 DIAGNOSIS — M25562 Pain in left knee: Secondary | ICD-10-CM | POA: Diagnosis not present

## 2022-03-13 DIAGNOSIS — M6281 Muscle weakness (generalized): Secondary | ICD-10-CM | POA: Diagnosis not present

## 2022-03-13 NOTE — Therapy (Signed)
OUTPATIENT PHYSICAL THERAPY LOWER EXTREMITY TREATMENT   Patient Name: Marisa Orozco MRN: [id number] DOB:10/27/1950, 71 y.o., female Today's Date: 03/13/2022  END OF SESSION:  PT End of Session - 03/13/22 1018     Visit Number 2    Number of Visits 13    Date for PT Re-Evaluation 05/31/22    Authorization Type MCR    PT Start Time 0937    PT Stop Time 1016    PT Time Calculation (min) 39 min    Activity Tolerance Patient tolerated treatment well    Behavior During Therapy [facility] for tasks assessed/performed              Past Medical History:  Diagnosis Date   Allergy    Anxiety    Cancer ([place])    basal cell carcinoma - face   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   [name] 1 Positive  Skin Bx- Subtle interface Dermatitis    Former smoker 02/04/2016   GERD (gastroesophageal reflux disease)    otc med prn   PONV (postoperative nausea and vomiting)    Raynaud's disease    Sjogren's disease ([place]) 02/04/2016   Positive ANA, Positive Ro, Positive CCP, Negative RF, Parotitis, Positive Sicca   Sjogren's syndrome ([place])    from MD note   Past Surgical History:  Procedure Laterality Date   BREAST SURGERY     augmentation   BUNIONECTOMY     right foot   COLONOSCOPY     DIAGNOSTIC LAPAROSCOPY     DILATION AND CURETTAGE OF UTERUS  04/15/2011   Procedure: DILATATION AND CURETTAGE;  Surgeon: [name];  Location: [facility];  Service: Gynecology;  Laterality: N/A;   IR RADIOLOGIST EVAL & MGMT  02/04/2021   MUSCLE BIOPSY Left 01/02/2016   Procedure: MUSCLE BIOPSY LEFT DELTOID;  Surgeon: [name], MD;  Location: [place];  Service: General;  Laterality: Left;  left deltoid muscle bx   NASAL SEPTUM SURGERY     SVD     x 2   TONSILLECTOMY     WISDOM TOOTH EXTRACTION     Patient Active Problem List   Diagnosis Date Noted   Palpitations 01/15/2021   Hypothyroidism 07/26/2019   Unilateral primary osteoarthritis, right knee 02/03/2018   Primary osteoarthritis of  both hands 09/08/2017   Sjogren's disease ([place]) 02/04/2016   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  She will sometimes use a walking stick when swelling occurs.  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  Pt has increased knee pain with ambulation and has difficulty with TKE with gait.  Pt has a knee brace and is waiting for the rep to fit it.    Pt states she pulled a muscle in her back before the 1st session of PT when she was catching her daughter from falling.  Her back became stirred up again last Thursday helping her daughter into the truck.  Pt denies  any adverse effects after prior Rx.  Pt reports compliance with HEP.  She denies pain with HEP and reports some muscle soreness.  Pt walks around her [place] in the kitchen for 10 mins, 2x/day and thinks it increases her knee pain.  Pt denies pain currently at rest.  She reports 2-3/10 pain in R knee with ambulation and some pain though less in L medial knee.    PERTINENT HISTORY: Dermatomyositis, L tibial stress fracture in 2022  PAIN:  Are you having pain? no: NPRS scale: 0/10 at rest and 2-3/10 with ambulation;  8/10 at its worst Pain location:  anterior, med and lateral R knee  Pain description:  sharp Aggravating factors: stairs, walking, squatting, swelling, turning too quickly Relieving factors: walking stick, reduction of swelling, bending the knee/pulling to the  chest   PRECAUTIONS: None  WEIGHT BEARING RESTRICTIONS: No  FALLS:  Has patient fallen in last 6 months? 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  No chondrocalcinosis was noted.  Moderate  patellofemoral narrowing was noted.  Radiographic progression was noted  when compared to the x-rays of 2019.   Impression: These findings are consistent with severe osteoarthritis and  moderate chondromalacia patella.     TODAY'S TREATMENT:                                                                                                                               Therapeutic Exercise: -R knee extension AROM:  2 deg of hyperextension -Reviewed current function, pain level, response to prior Rx, and HEP compliance. -Reviewed HEP. -Pt performed: Supine SLR 2x10 bilat S/L hip abd 2x10 bilat Sit to stand with RTB around thighs 2x10 LAQ with GTB 2x10 bilat Sidestepping with RTB around thighs x 2 laps at rail TKE with GTB 2x10 bilat  PT updated HEP and gave pt a HEP handout.  Pt educated in correct form and appropriate frequency.   PATIENT EDUCATION:  Education details:  diagnosis, relevant anatomy, exercise form and rationale, HEP, and POC Person educated: Patient Education method: Explanation, Demonstration, Tactile cues, Verbal cues, and Handouts Education comprehension: verbalized understanding and returned demonstration  HOME EXERCISE PROGRAM:  Access Code: [email or web address] URL: [email or web address] Date: 03/02/2022 Prepared by: Daleen Bo  Updated HEP: -  Supine Active Straight Leg Raise  - 1 x daily - 5-6 x weekly - 2 sets - 10 reps - Sidelying Hip Abduction  - 1 x daily - 5-6 x weekly - 2 sets - 10 reps  ASSESSMENT:  CLINICAL IMPRESSION: Pt reports having limitations in R knee extension with gait.  PT assessed ROM and she has full knee extension actively.  Pt is compliant with HEP.  PT reviewed HEP and pt demonstrates good understanding.  Pt performed exercises well with cuing and instruction in correct form.  PT updated HEP and gave pt a HEP handout.  Pt performs a daily walking program and reports increased knee pain with walking program.  PT educated pt in ways to modify walking program to reduce pain.  She responded well to Rx having no increased pain after Rx.  Pt should benefit from cont skilled PT services to address ongoing goals and to improve overall function.       OBJECTIVE IMPAIRMENTS decreased mobility, difficulty walking, decreased ROM, decreased strength, increased muscle spasms, improper body mechanics, postural dysfunction, and pain.    ACTIVITY LIMITATIONS lifting, sitting, standing, squatting, stairs, transfers, locomotion level   PARTICIPATION LIMITATIONS: cleaning, laundry, driving, shopping, community activity, occupation, yard work, and exercise   PERSONAL FACTORS Age, Fitness, Time since onset of injury/illness/exacerbation, and 1-3comorbidity:    are also affecting patient's functional outcome.    REHAB POTENTIAL: Fair   CLINICAL DECISION MAKING: Stable/uncomplicated   EVALUATION COMPLEXITY: Low     GOALS:     SHORT TERM GOALS: Target date: 04/13/2022  Pt will become independent with HEP in order to demonstrate synthesis of PT education..   Goal status: INITIAL   2.  Pt will be able to demonstrate descending stair pattern without pain in order to demonstrate functional improvement in LE strength and house hold mobility.    Goal status: INITIAL   3.  Pt will be able to demonstrate/report ability to  sit/stand/sleep for extended periods of time without pain in order to demonstrate functional improvement and tolerance to static positioning.    Goal status: INITIAL     LONG TERM GOALS: Target date: 05/25/2022    Pt  will become independent with final HEP in order to demonstrate synthesis of PT education.   Goal status: INITIAL   2.  Pt will score >/= 58 on FOTO to demonstrate improvement in perceived bilat knee function.    Goal status: INITIAL   3.  Pt will be able to demonstrate full depth squat without pain in order to demonstrate functional improvement in LE function for self-care and house hold duties. .    Goal status: INITIAL   4.  Pt will be able to demonstrate/report ability to walk >25 mins without pain in order to demonstrate functional improvement and tolerance to exercise and community mobility.    Goal status: INITIAL       PLAN: PT FREQUENCY: 1-2x/week   PT DURATION: 12 weeks (likely DC in 8)   PLANNED INTERVENTIONS: Therapeutic exercises, Therapeutic activity, Neuromuscular re-education, Balance training, Gait training, Patient/Family education, Self Care, Joint mobilization, Stair training, Aquatic Therapy, Dry Needling, Electrical stimulation, Spinal mobilization, Cryotherapy, Moist heat, Taping, Ultrasound, Ionotophoresis 4mg /ml Dexamethasone, and Manual therapy    PLAN FOR NEXT SESSION: continue with quad and hip strength, check HS, fig 4 bridge holds; edu about neutral footwear to promote better knee alignment.  Review HEP.   Selinda Michaels III PT, DPT 03/13/22 5:53 PM

## 2022-03-16 ENCOUNTER — Other Ambulatory Visit: Payer: Self-pay | Admitting: Physician Assistant

## 2022-03-16 ENCOUNTER — Encounter: Payer: Self-pay | Admitting: Orthopaedic Surgery

## 2022-03-16 ENCOUNTER — Ambulatory Visit (INDEPENDENT_AMBULATORY_CARE_PROVIDER_SITE_OTHER): Payer: Medicare Other | Admitting: Orthopaedic Surgery

## 2022-03-16 DIAGNOSIS — G8929 Other chronic pain: Secondary | ICD-10-CM | POA: Diagnosis not present

## 2022-03-16 DIAGNOSIS — M25561 Pain in right knee: Secondary | ICD-10-CM | POA: Diagnosis not present

## 2022-03-16 DIAGNOSIS — M1711 Unilateral primary osteoarthritis, right knee: Secondary | ICD-10-CM | POA: Diagnosis not present

## 2022-03-16 NOTE — Progress Notes (Signed)
The patient is a very pleasant and then active 72 year old female with known well-documented tricompartment arthritis is involves her right knee.  She also has varus malalignment.  She is here today after being just fitted today for a medial compartment offloading osteoarthritis brace.  She had some slight instability with varus stressing of that knee to be expected given her arthritis.  I believe that knee brace is going to help her quite a bit.  It is fitting nicely on her knee today.  She has also been set up for a SAM device in order to try to treat the osteoarthritis of that knee.  She does not perform high impact aerobic activities.  She has already had different types of injections for her knee.  Her previous x-rays also show tricompartment arthritis mainly involving the medial compartment.  At this point follow-up is as needed.  If things worsen for her knee she will let us know.  All questions and concerns were answered and addressed.

## 2022-03-16 NOTE — Telephone Encounter (Signed)
Next Visit: 05/15/2022   Last Visit: 01/30/2022  Current Dose per office note 35/45/6256: olic acid 2mg  po qd   LS:[id number]    Last Fill: 04/02/2021  Okay to refill Folic Acid?

## 2022-03-17 DIAGNOSIS — G4733 Obstructive sleep apnea (adult) (pediatric): Secondary | ICD-10-CM | POA: Diagnosis not present

## 2022-03-18 ENCOUNTER — Encounter (HOSPITAL_BASED_OUTPATIENT_CLINIC_OR_DEPARTMENT_OTHER): Payer: Self-pay | Admitting: Physical Therapy

## 2022-03-18 ENCOUNTER — Ambulatory Visit (HOSPITAL_BASED_OUTPATIENT_CLINIC_OR_DEPARTMENT_OTHER): Payer: Medicare Other | Admitting: Physical Therapy

## 2022-03-18 DIAGNOSIS — M6281 Muscle weakness (generalized): Secondary | ICD-10-CM

## 2022-03-18 DIAGNOSIS — R262 Difficulty in walking, not elsewhere classified: Secondary | ICD-10-CM

## 2022-03-18 DIAGNOSIS — G8929 Other chronic pain: Secondary | ICD-10-CM

## 2022-03-18 DIAGNOSIS — M25562 Pain in left knee: Secondary | ICD-10-CM | POA: Diagnosis not present

## 2022-03-18 DIAGNOSIS — M25561 Pain in right knee: Secondary | ICD-10-CM | POA: Diagnosis not present

## 2022-03-18 NOTE — Therapy (Signed)
OUTPATIENT PHYSICAL THERAPY LOWER EXTREMITY TREATMENT   Patient Name: Marisa Orozco MRN: [id number] DOB:04-21-1950, 71 y.o., female Today's Date: 03/18/2022  END OF SESSION:  PT End of Session - 03/18/22 0901     Visit Number 3    Number of Visits 13    Date for PT Re-Evaluation 05/31/22    Authorization Type MCR    PT Start Time 0846    PT Stop Time 0927    PT Time Calculation (min) 41 min    Activity Tolerance Patient tolerated treatment well    Behavior During Therapy [facility] for tasks assessed/performed               Past Medical History:  Diagnosis Date   Allergy    Anxiety    Cancer ([place])    basal cell carcinoma - face   Dermatomyositis ([place]) 02/04/2016   [name] 1 Positive  Skin Bx- Subtle interface Dermatitis    Former smoker 02/04/2016   GERD (gastroesophageal reflux disease)    otc med prn   PONV (postoperative nausea and vomiting)    Raynaud's disease    Sjogren's disease ([place]) 02/04/2016   Positive ANA, Positive Ro, Positive CCP, Negative RF, Parotitis, Positive Sicca   Sjogren's syndrome ([place])    from MD note   Past Surgical History:  Procedure Laterality Date   BREAST SURGERY     augmentation   BUNIONECTOMY     right foot   COLONOSCOPY     DIAGNOSTIC LAPAROSCOPY     DILATION AND CURETTAGE OF UTERUS  04/15/2011   Procedure: DILATATION AND CURETTAGE;  Surgeon: [name];  Location: [facility];  Service: Gynecology;  Laterality: N/A;   IR RADIOLOGIST EVAL & MGMT  02/04/2021   MUSCLE BIOPSY Left 01/02/2016   Procedure: MUSCLE BIOPSY LEFT DELTOID;  Surgeon: [name], MD;  Location: [facility];  Service: General;  Laterality: Left;  left deltoid muscle bx   NASAL SEPTUM SURGERY     SVD     x 2   TONSILLECTOMY     WISDOM TOOTH EXTRACTION     Patient Active Problem List   Diagnosis Date Noted   Palpitations 01/15/2021   Hypothyroidism 07/26/2019   Unilateral primary osteoarthritis, right knee 02/03/2018   Primary osteoarthritis  of both hands 09/08/2017   Sjogren's disease ([place]) 02/04/2016   Dermatomyositis ([place]) 02/04/2016   Former smoker 02/04/2016   Thymoma 02/04/2016   Fatigue 02/15/2012    PCP: Caryl Bis, MD   REFERRING PROVIDER: Bo Merino, MD   REFERRING DIAG:  M17.0 (ICD-10-CM) - Primary osteoarthritis of both knees  M25.561,G89.29 (ICD-10-CM) - Chronic pain of right knee    THERAPY DIAG:  Chronic pain of both knees  Muscle weakness (generalized)  Difficulty walking  Rationale for Evaluation and Treatment: Rehabilitation  ONSET DATE: June 2023  SUBJECTIVE:   SUBJECTIVE STATEMENT:  Things are OK. It hurts when I walk. Dr. Ninfa Linden gave me a brace that uses [place] that is a trial device. Just sitting here I know the knee is here. I have pain in my back it has nothing to do with my knee I pulled my back getting my daughter into the truck.    PERTINENT HISTORY: Dermatomyositis, L tibial stress fracture in 2022  PAIN:  Are you having pain? no: NPRS scale: 1/10 at rest and 2-3/10 with ambulation;  4/10 at its worst Pain location:  medial R knee   Pain description:  "I don't know what you  mean I know its there"  Aggravating factors: stairs, walking, squatting, swelling, turning too quickly Relieving factors:  not walking on it   PRECAUTIONS: None  WEIGHT BEARING RESTRICTIONS: No  FALLS:  Has patient fallen in last 6 months? 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  No chondrocalcinosis was noted.  Moderate  patellofemoral narrowing was noted.  Radiographic  progression was noted  when compared to the x-rays of 2019.   Impression: These findings are consistent with severe osteoarthritis and  moderate chondromalacia patella.     TODAY'S TREATMENT:                                                                                                                               TherEx  Nustep L5 x6 minutes BLEs only  Bridge with green TB above knees x12 with 2 second holds Sidelying hip ABD green TB x12 B Figure 4 bridges x10 B 2 second holds  Walking bridges x10  HS stretches 3x30 seconds B Piriformis stretch 2x30 seconds B Hip hikes x10 B 3 way hip hikes x10 B      PATIENT EDUCATION:  Education details:  exercise form/purpose, repeated education to inform PT if exercise is causing pain instead of pushing through, education to not hyperextend knees, avoiding heels/continue using neutral footwear  Person educated: Patient Education method: Explanation, Demonstration, Tactile cues, Verbal cues, and Handouts Education comprehension: verbalized understanding and returned demonstration  HOME EXERCISE PROGRAM:  Access Code: [email or web address] URL: [email or web address] Date: 03/02/2022 Prepared by: Daleen Bo  Updated HEP: - Supine Active Straight Leg Raise  - 1 x daily - 5-6 x weekly - 2 sets - 10 reps - Sidelying Hip Abduction  - 1 x daily - 5-6 x weekly - 2 sets - 10 reps  ASSESSMENT:  CLINICAL IMPRESSION:  Marisa Orozco arrives today doing OK. She was extremely vague with describing her pain or anything with her knee, this PT had to ask multiple repeated questions to get information from her today. Progressed all exercises as able and tolerated, does sound like hamstrings are coming into play here. Really had to encourage her to let PT know if pain was increasing during an exercise- would often push through and tell therapist exercise made pain worse after completing task.  Will continue efforts as able and tolerated.     OBJECTIVE  IMPAIRMENTS decreased mobility, difficulty walking, decreased ROM, decreased strength, increased muscle spasms, improper body mechanics, postural dysfunction, and pain.    ACTIVITY LIMITATIONS lifting, sitting, standing, squatting, stairs, transfers, locomotion level   PARTICIPATION LIMITATIONS: cleaning, laundry,  driving, shopping, community activity, occupation, yard work, and exercise   PERSONAL FACTORS Age, Fitness, Time since onset of injury/illness/exacerbation, and 1-3comorbidity:    are also affecting patient's functional outcome.    REHAB POTENTIAL: Fair   CLINICAL DECISION MAKING: Stable/uncomplicated   EVALUATION COMPLEXITY: Low     GOALS:     SHORT TERM GOALS: Target date: 04/13/2022  Pt will become independent with HEP in order to demonstrate synthesis of PT education..   Goal status: INITIAL   2.  Pt will be able to demonstrate descending stair pattern without pain in order to demonstrate functional improvement in LE strength and house hold mobility.    Goal status: INITIAL   3.  Pt will be able to demonstrate/report ability to sit/stand/sleep for extended periods of time without pain in order to demonstrate functional improvement and tolerance to static positioning.    Goal status: INITIAL     LONG TERM GOALS: Target date: 05/25/2022    Pt  will become independent with final HEP in order to demonstrate synthesis of PT education.   Goal status: INITIAL   2.  Pt will score >/= 58 on FOTO to demonstrate improvement in perceived bilat knee function.    Goal status: INITIAL   3.  Pt will be able to demonstrate full depth squat without pain in order to demonstrate functional improvement in LE function for self-care and house hold duties. .    Goal status: INITIAL   4.  Pt will be able to demonstrate/report ability to walk >25 mins without pain in order to demonstrate functional improvement and tolerance to exercise and community mobility.    Goal status:  INITIAL       PLAN: PT FREQUENCY: 1-2x/week   PT DURATION: 12 weeks (likely DC in 8)   PLANNED INTERVENTIONS: Therapeutic exercises, Therapeutic activity, Neuromuscular re-education, Balance training, Gait training, Patient/Family education, Self Care, Joint mobilization, Stair training, Aquatic Therapy, Dry Needling, Electrical stimulation, Spinal mobilization, Cryotherapy, Moist heat, Taping, Ultrasound, Ionotophoresis 4mg /ml Dexamethasone, and Manual therapy    PLAN FOR NEXT SESSION: continue with quad and hip strength, check HS, fig 4 bridge holds; edu about neutral footwear to promote better knee alignment.  Review HEP.  Ann Lions PT DPT PN2

## 2022-03-20 ENCOUNTER — Ambulatory Visit (HOSPITAL_BASED_OUTPATIENT_CLINIC_OR_DEPARTMENT_OTHER): Payer: Medicare Other | Admitting: Physical Therapy

## 2022-03-20 ENCOUNTER — Encounter (HOSPITAL_BASED_OUTPATIENT_CLINIC_OR_DEPARTMENT_OTHER): Payer: Self-pay | Admitting: Physical Therapy

## 2022-03-20 DIAGNOSIS — R262 Difficulty in walking, not elsewhere classified: Secondary | ICD-10-CM

## 2022-03-20 DIAGNOSIS — G8929 Other chronic pain: Secondary | ICD-10-CM | POA: Diagnosis not present

## 2022-03-20 DIAGNOSIS — M6281 Muscle weakness (generalized): Secondary | ICD-10-CM

## 2022-03-20 DIAGNOSIS — M25562 Pain in left knee: Secondary | ICD-10-CM | POA: Diagnosis not present

## 2022-03-20 DIAGNOSIS — M25561 Pain in right knee: Secondary | ICD-10-CM | POA: Diagnosis not present

## 2022-03-20 NOTE — Therapy (Signed)
OUTPATIENT PHYSICAL THERAPY LOWER EXTREMITY TREATMENT   Patient Name: Marisa Orozco MRN: [id number] DOB:22-Jul-1950, 71 y.o., female Today's Date: 03/20/2022  END OF SESSION:  PT End of Session - 03/20/22 0901     Visit Number 4    Number of Visits 13    Date for PT Re-Evaluation 05/31/22    Authorization Type MCR    PT Start Time 0846    PT Stop Time 0925    PT Time Calculation (min) 39 min    Activity Tolerance Patient tolerated treatment well    Behavior During Therapy [facility] for tasks assessed/performed                Past Medical History:  Diagnosis Date   Allergy    Anxiety    Cancer ([place])    basal cell carcinoma - face   Dermatomyositis ([place]) 02/04/2016   Marisa Orozco 1 Positive  Skin Bx- Subtle interface Dermatitis    Former smoker 02/04/2016   GERD (gastroesophageal reflux disease)    otc med prn   PONV (postoperative nausea and vomiting)    Raynaud's disease    Sjogren's disease ([place]) 02/04/2016   Positive ANA, Positive Ro, Positive CCP, Negative RF, Parotitis, Positive Sicca   Sjogren's syndrome ([place])    from MD note   Past Surgical History:  Procedure Laterality Date   BREAST SURGERY     augmentation   BUNIONECTOMY     right foot   COLONOSCOPY     DIAGNOSTIC LAPAROSCOPY     DILATION AND CURETTAGE OF UTERUS  04/15/2011   Procedure: DILATATION AND CURETTAGE;  Surgeon: [name];  Location: [facility];  Service: Gynecology;  Laterality: N/A;   IR RADIOLOGIST EVAL & MGMT  02/04/2021   MUSCLE BIOPSY Left 01/02/2016   Procedure: MUSCLE BIOPSY LEFT DELTOID;  Surgeon: [name], MD;  Location: [facility];  Service: General;  Laterality: Left;  left deltoid muscle bx   NASAL SEPTUM SURGERY     SVD     x 2   TONSILLECTOMY     WISDOM TOOTH EXTRACTION     Patient Active Problem List   Diagnosis Date Noted   Palpitations 01/15/2021   Hypothyroidism 07/26/2019   Unilateral primary osteoarthritis, right knee 02/03/2018   Primary  osteoarthritis of both hands 09/08/2017   Sjogren's disease ([place]) 02/04/2016   Dermatomyositis ([place]) 02/04/2016   Former smoker 02/04/2016   Thymoma 02/04/2016   Fatigue 02/15/2012    PCP: Caryl Bis, MD   REFERRING PROVIDER: Bo Merino, MD   REFERRING DIAG:  M17.0 (ICD-10-CM) - Primary osteoarthritis of both knees  M25.561,G89.29 (ICD-10-CM) - Chronic pain of right knee    THERAPY DIAG:  Chronic pain of both knees  Muscle weakness (generalized)  Difficulty walking  Rationale for Evaluation and Treatment: Rehabilitation  ONSET DATE: June 2023  SUBJECTIVE:   SUBJECTIVE STATEMENT:  Feeling good today. Knee is there. Its not worse. Back is better.    PERTINENT HISTORY: Dermatomyositis, L tibial stress fracture in 2022  PAIN:  Are you having pain? no: NPRS scale: 1/10 at rest  Pain location:  medial R knee   Pain description:  "its just there when I walk on it"  Aggravating factors: stairs, walking, squatting, swelling, turning too quickly Relieving factors:  not walking on it   PRECAUTIONS: None  WEIGHT BEARING RESTRICTIONS: No  FALLS:  Has patient fallen in last 6 months? No  LIVING ENVIRONMENT: Lives with: lives with their family Lives  in: House/apartment Stairs: Yes but does not use them; goes up and down sideways Has following equipment at home:  walking stick/hiking stick  OCCUPATION: [employer]   PLOF: Independent  PATIENT GOALS: Strengthen knees to avoid TKA/prolong TKA  OBJECTIVE:   DIAGNOSTIC FINDINGS:   Severe medial compartment narrowing with intercondylar and medial  osteophytes was noted.  No chondrocalcinosis was noted.  Moderate  patellofemoral narrowing was noted.  Radiographic progression was noted  when compared to the x-rays of 2019.   Impression: These findings are consistent with severe osteoarthritis and  moderate chondromalacia patella.     TODAY'S TREATMENT:                                                                                                                                03/20/22  TherEx  Nustep L5 x6 minutes   Walking bridges x15  Sidelying hip ABD green TB x12 B Quadruped hip ABD and extension against green TB x10 B Hip hikes x10 B Hip hike 3 way x10 B Forward step ups 6 inch box U UE support x10 Lateral step ups x10 4 inch box BUE support Step downs from 4 inch box x10B with TA set  PPT 1x15 3 second holds  PPT with march x10  Supine SLR with PPT x10 B   PATIENT EDUCATION:  Education details:  exercise form/purpose, could consider trying men's shoes to get more width in the forefoot area for bunion but go 1-1.5 sizes down from her size in women's shoes for this should not have excessive space between big toe and end of shoe  Person educated: Patient Education method: Explanation, Demonstration, Tactile cues, Verbal cues, and Handouts Education comprehension: verbalized understanding and returned demonstration  HOME EXERCISE PROGRAM:  Access Code: [email or web address] URL: [email or web address] Date: 03/20/2022 Prepared by: Deniece Ree  Exercises - Sit to Stand with Resistance Around Legs  - 1 x daily - 5 x weekly - 3 sets - 10 reps - Side Stepping with Resistance at Thighs  - 1 x daily - 5 x weekly - 1 sets - 3 reps - 60ft hold - Sitting Knee Extension with Resistance  - 1 x daily - 5 x weekly - 2 sets - 10 reps - Supine Active Straight Leg Raise  - 1 x daily - 5-6 x weekly - 2 sets - 10 reps - Sidelying Hip Abduction  - 1 x daily - 5-6 x weekly - 2 sets - 10 reps - Standing Hip Hiking  - 1 x daily - 7 x weekly - 3 sets - 15 reps - Bridge Walk Out  - 1 x daily - 7 x weekly - 3 sets - 10 reps  ASSESSMENT:  CLINICAL IMPRESSION:  [name] arrives today doing OK. Had sneakers on today, still having issues with width of shoe we discussed trying men's shoes for increased width/better fit. Otherwise focused on progressing functional strength as able.  Worked  on core strength due to increasing back pain with exercise, does have quite a bit of weakness in abdominals.Will continue to progress.    OBJECTIVE IMPAIRMENTS decreased mobility, difficulty walking, decreased ROM, decreased strength, increased muscle spasms, improper body mechanics, postural dysfunction, and pain.    ACTIVITY LIMITATIONS lifting, sitting, standing, squatting, stairs, transfers, locomotion level   PARTICIPATION LIMITATIONS: cleaning, laundry, driving, shopping, community activity, occupation, yard work, and exercise   PERSONAL FACTORS Age, Fitness, Time since onset of injury/illness/exacerbation, and 1-3comorbidity:    are also affecting patient's functional outcome.    REHAB POTENTIAL: Fair   CLINICAL DECISION MAKING: Stable/uncomplicated   EVALUATION COMPLEXITY: Low     GOALS:     SHORT TERM GOALS: Target date: 04/13/2022  Pt will become independent with HEP in order to demonstrate synthesis of PT education..   Goal status: INITIAL   2.  Pt will be able to demonstrate descending stair pattern without pain in order to demonstrate functional improvement in LE strength and house hold mobility.    Goal status: INITIAL   3.  Pt will be able to demonstrate/report ability to sit/stand/sleep for extended periods of time without pain in order to demonstrate functional improvement and tolerance to static positioning.    Goal status: INITIAL     LONG TERM GOALS: Target date: 05/25/2022    Pt  will become independent with final HEP in order to demonstrate synthesis of PT education.   Goal status: INITIAL   2.  Pt will score >/= 58 on FOTO to demonstrate improvement in perceived bilat knee function.    Goal status: INITIAL   3.  Pt will be able to demonstrate full depth squat without pain in order to demonstrate functional improvement in LE function for self-care and house hold duties. .    Goal status: INITIAL   4.  Pt will be able to demonstrate/report  ability to walk >25 mins without pain in order to demonstrate functional improvement and tolerance to exercise and community mobility.    Goal status: INITIAL       PLAN: PT FREQUENCY: 1-2x/week   PT DURATION: 12 weeks (likely DC in 8)   PLANNED INTERVENTIONS: Therapeutic exercises, Therapeutic activity, Neuromuscular re-education, Balance training, Gait training, Patient/Family education, Self Care, Joint mobilization, Stair training, Aquatic Therapy, Dry Needling, Electrical stimulation, Spinal mobilization, Cryotherapy, Moist heat, Taping, Ultrasound, Ionotophoresis 4mg /ml Dexamethasone, and Manual therapy    PLAN FOR NEXT SESSION: continue with quad and hip strength, check HS, fig 4 bridge holds; edu about neutral footwear to promote better knee alignment.  Review HEP.  Ann Lions PT DPT PN2

## 2022-03-23 ENCOUNTER — Encounter (HOSPITAL_BASED_OUTPATIENT_CLINIC_OR_DEPARTMENT_OTHER): Payer: Self-pay | Admitting: Physical Therapy

## 2022-03-23 ENCOUNTER — Ambulatory Visit (HOSPITAL_BASED_OUTPATIENT_CLINIC_OR_DEPARTMENT_OTHER): Payer: Medicare Other | Admitting: Physical Therapy

## 2022-03-23 DIAGNOSIS — G8929 Other chronic pain: Secondary | ICD-10-CM

## 2022-03-23 DIAGNOSIS — M25561 Pain in right knee: Secondary | ICD-10-CM | POA: Diagnosis not present

## 2022-03-23 DIAGNOSIS — M6281 Muscle weakness (generalized): Secondary | ICD-10-CM | POA: Diagnosis not present

## 2022-03-23 DIAGNOSIS — R262 Difficulty in walking, not elsewhere classified: Secondary | ICD-10-CM

## 2022-03-23 DIAGNOSIS — M25562 Pain in left knee: Secondary | ICD-10-CM | POA: Diagnosis not present

## 2022-03-23 NOTE — Therapy (Signed)
OUTPATIENT PHYSICAL THERAPY LOWER EXTREMITY TREATMENT   Patient Name: Marisa Orozco MRN: [id number] DOB:1951-01-18, 71 y.o., female Today's Date: 03/23/2022  END OF SESSION:  PT End of Session - 03/23/22 1511     Visit Number 5    Number of Visits 13    Date for PT Re-Evaluation 05/31/22    Authorization Type MCR    Progress Note Due on Visit 10    PT Start Time 1430    PT Stop Time 1511    PT Time Calculation (min) 41 min    Activity Tolerance Patient tolerated treatment well    Behavior During Therapy [facility] for tasks assessed/performed                 Past Medical History:  Diagnosis Date   Allergy    Anxiety    Cancer ([place])    basal cell carcinoma - face   Dermatomyositis ([place]) 02/04/2016   [name] 1 Positive  Skin Bx- Subtle interface Dermatitis    Former smoker 02/04/2016   GERD (gastroesophageal reflux disease)    otc med prn   PONV (postoperative nausea and vomiting)    Raynaud's disease    Sjogren's disease ([place]) 02/04/2016   Positive ANA, Positive Ro, Positive CCP, Negative RF, Parotitis, Positive Sicca   Sjogren's syndrome ([place])    from MD note   Past Surgical History:  Procedure Laterality Date   BREAST SURGERY     augmentation   BUNIONECTOMY     right foot   COLONOSCOPY     DIAGNOSTIC LAPAROSCOPY     DILATION AND CURETTAGE OF UTERUS  04/15/2011   Procedure: DILATATION AND CURETTAGE;  Surgeon: [name];  Location: [facility];  Service: Gynecology;  Laterality: N/A;   IR RADIOLOGIST EVAL & MGMT  02/04/2021   MUSCLE BIOPSY Left 01/02/2016   Procedure: MUSCLE BIOPSY LEFT DELTOID;  Surgeon: [name], MD;  Location: [place];  Service: General;  Laterality: Left;  left deltoid muscle bx   NASAL SEPTUM SURGERY     SVD     x 2   TONSILLECTOMY     WISDOM TOOTH EXTRACTION     Patient Active Problem List   Diagnosis Date Noted   Palpitations 01/15/2021   Hypothyroidism 07/26/2019   Unilateral primary osteoarthritis, right knee  02/03/2018   Primary osteoarthritis of both hands 09/08/2017   Sjogren's disease ([place]) 02/04/2016   Dermatomyositis ([place]) 02/04/2016   Former smoker 02/04/2016   Thymoma 02/04/2016   Fatigue 02/15/2012    PCP: Caryl Bis, MD   REFERRING PROVIDER: Bo Merino, MD   REFERRING DIAG:  M17.0 (ICD-10-CM) - Primary osteoarthritis of both knees  M25.561,G89.29 (ICD-10-CM) - Chronic pain of right knee    THERAPY DIAG:  Chronic pain of both knees  Muscle weakness (generalized)  Difficulty walking  Rationale for Evaluation and Treatment: Rehabilitation  ONSET DATE: June 2023  SUBJECTIVE:   SUBJECTIVE STATEMENT:  Things are OK, whatever we did maybe on the steps last time stirred up the R knee a bit it felt more swollen and then it got better on Sunday. I tried the [place] brace the MD gave me and maybe it helped. Knee is back to "bad normal" today.     PERTINENT HISTORY: Dermatomyositis, L tibial stress fracture in 2022  PAIN:  Are you having pain? no: NPRS scale: 2/10 at rest  Pain location:  medial R knee and back  Pain description:  "its like a new normal  pain" not a sharp pain  Aggravating factors: stairs, walking, squatting, swelling, turning too quickly Relieving factors:  not walking on it   PRECAUTIONS: None  WEIGHT BEARING RESTRICTIONS: No  FALLS:  Has patient fallen in last 6 months? 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  No chondrocalcinosis was noted.  Moderate  patellofemoral narrowing was noted.  Radiographic progression was noted   when compared to the x-rays of 2019.   Impression: These findings are consistent with severe osteoarthritis and  moderate chondromalacia patella.     TODAY'S TREATMENT:      03/23/22  TherEx      Nustep L5 x6 minutes BLEs only  PPT 20x5 second holds PPT with march x15 PPT with 90/90 taps x15  PPT with SLR x12 B  Curl ups x10 with 2 second holds  Sidelying hip ABD x15 green TB around knees Quadruped hip ABD x12 green TB Quadruped hip extension x12 B green TB  Resisted hip hikes x15 green TB Forward step ups on 2 inch box x15 B Lateral step ups on 2 inch box x15 B Step downs on 2 inch box x10 B Forward lunges onto 6 inch box x5 B                                                                                                           03/20/22  TherEx  Nustep L5 x6 minutes   Walking bridges x15  Sidelying hip ABD green TB x12 B Quadruped hip ABD and extension against green TB x10 B Hip hikes x10 B Hip hike 3 way x10 B Forward step ups 6 inch box U UE support x10 Lateral step ups x10 4 inch box BUE support Step downs from 4 inch box x10B with TA set  PPT 1x15 3 second holds  PPT with march x10  Supine SLR with PPT x10 B   PATIENT EDUCATION:  Education details:  exercise form/purpose, differences between shock absorption on flat ground and on TM- TM may be better for in big picture actually  Person educated: Patient Education method: Explanation, Demonstration, Tactile cues, Verbal cues, and Handouts Education comprehension: verbalized understanding and returned demonstration  HOME EXERCISE PROGRAM:  Access Code: [email or web address] URL: [email or web address] Date: 03/23/2022 Prepared by: Deniece Ree  Exercises - Sit to Stand with Resistance Around Legs  - 1 x daily - 5 x weekly - 3 sets - 10 reps - Side Stepping with Resistance at Thighs  - 1 x daily - 5 x weekly - 1 sets - 3 reps - 70ft hold - Sitting Knee Extension with Resistance  - 1 x daily - 5 x  weekly - 2 sets -  10 reps - Supine Active Straight Leg Raise  - 1 x daily - 5-6 x weekly - 2 sets - 10 reps - Sidelying Hip Abduction  - 1 x daily - 5-6 x weekly - 2 sets - 10 reps - Standing Hip Hiking  - 1 x daily - 7 x weekly - 3 sets - 15 reps - Bridge Walk Out  - 1 x daily - 7 x weekly - 3 sets - 10 reps - Supine Posterior Pelvic Tilt  - 1 x daily - 7 x weekly - 3 sets - 10 reps - Supine 90/90 Alternating Heel Touches with Posterior Pelvic Tilt  - 1 x daily - 7 x weekly - 3 sets - 10 reps - Curl Up with Reach  - 1 x daily - 7 x weekly - 3 sets - 10 reps  ASSESSMENT:  CLINICAL IMPRESSION:  [name] arrives today doing OK, sounds like we might have flared her R knee up a bit with close chain work last session but it improve over the weekend. Continued working on core strengthening today, also continued hip/LE/core strengthening as able and tolerated. Will continue efforts.    OBJECTIVE IMPAIRMENTS decreased mobility, difficulty walking, decreased ROM, decreased strength, increased muscle spasms, improper body mechanics, postural dysfunction, and pain.    ACTIVITY LIMITATIONS lifting, sitting, standing, squatting, stairs, transfers, locomotion level   PARTICIPATION LIMITATIONS: cleaning, laundry, driving, shopping, community activity, occupation, yard work, and exercise   PERSONAL FACTORS Age, Fitness, Time since onset of injury/illness/exacerbation, and 1-3comorbidity:    are also affecting patient's functional outcome.    REHAB POTENTIAL: Fair   CLINICAL DECISION MAKING: Stable/uncomplicated   EVALUATION COMPLEXITY: Low     GOALS:     SHORT TERM GOALS: Target date: 04/13/2022  Pt will become independent with HEP in order to demonstrate synthesis of PT education..   Goal status: INITIAL   2.  Pt will be able to demonstrate descending stair pattern without pain in order to demonstrate functional improvement in LE strength and house hold mobility.    Goal status: INITIAL   3.   Pt will be able to demonstrate/report ability to sit/stand/sleep for extended periods of time without pain in order to demonstrate functional improvement and tolerance to static positioning.    Goal status: INITIAL     LONG TERM GOALS: Target date: 05/25/2022    Pt  will become independent with final HEP in order to demonstrate synthesis of PT education.   Goal status: INITIAL   2.  Pt will score >/= 58 on FOTO to demonstrate improvement in perceived bilat knee function.    Goal status: INITIAL   3.  Pt will be able to demonstrate full depth squat without pain in order to demonstrate functional improvement in LE function for self-care and house hold duties. .    Goal status: INITIAL   4.  Pt will be able to demonstrate/report ability to walk >25 mins without pain in order to demonstrate functional improvement and tolerance to exercise and community mobility.    Goal status: INITIAL       PLAN: PT FREQUENCY: 1-2x/week   PT DURATION: 12 weeks (likely DC in 8)   PLANNED INTERVENTIONS: Therapeutic exercises, Therapeutic activity, Neuromuscular re-education, Balance training, Gait training, Patient/Family education, Self Care, Joint mobilization, Stair training, Aquatic Therapy, Dry Needling, Electrical stimulation, Spinal mobilization, Cryotherapy, Moist heat, Taping, Ultrasound, Ionotophoresis 4mg /ml Dexamethasone, and Manual therapy    PLAN FOR NEXT SESSION: continue with quad and hip strength,  check HS, fig 4 bridge holds; edu about neutral footwear to promote better knee alignment.  Review HEP.  Ann Lions PT DPT PN2

## 2022-03-27 ENCOUNTER — Ambulatory Visit (HOSPITAL_BASED_OUTPATIENT_CLINIC_OR_DEPARTMENT_OTHER): Payer: Medicare Other | Admitting: Physical Therapy

## 2022-03-28 ENCOUNTER — Other Ambulatory Visit: Payer: Self-pay | Admitting: Rheumatology

## 2022-04-01 ENCOUNTER — Encounter (HOSPITAL_BASED_OUTPATIENT_CLINIC_OR_DEPARTMENT_OTHER): Payer: Medicare Other | Admitting: Physical Therapy

## 2022-04-01 NOTE — Telephone Encounter (Signed)
Next Visit: 05/15/2022  Last Visit: 01/30/2022  Last Fill: 10/02/2021  Dx:  Dermatomyositis    Current Dose per office note on 01/30/2022: prednisone 3.0 mg every day.   Okay to refill prednisone?

## 2022-04-02 ENCOUNTER — Other Ambulatory Visit: Payer: Self-pay | Admitting: *Deleted

## 2022-04-02 MED ORDER — METHOTREXATE SODIUM CHEMO INJECTION 250 MG/10ML: INTRAMUSCULAR | 0 refills | Status: DC

## 2022-04-02 NOTE — Telephone Encounter (Signed)
From: [name] To: Office of Ofilia Neas, [place] Sent: 04/01/2022 6:44 PM EST Subject: Medication Renewal Request  Refills have been requested for the following medications:   methotrexate 250 MG/10ML injection Marisa Orozco]  Preferred pharmacy: [id number] - [place], [place]. AT [place] Delivery method: Arlyss Gandy

## 2022-04-02 NOTE — Telephone Encounter (Signed)
Next Visit: 05/15/2022   Last Visit: 01/30/2022   Current Dose per office note 01/30/2022  [id number]   Labs: 12/02/2021 WBC 10.9, Neutro Abs 7,881   Last Fill: 12/02/2021  Notified patient via my chart she is due to update labs.   Okay to refill MTX?

## 2022-04-03 ENCOUNTER — Encounter (HOSPITAL_BASED_OUTPATIENT_CLINIC_OR_DEPARTMENT_OTHER): Payer: Self-pay | Admitting: Physical Therapy

## 2022-04-03 ENCOUNTER — Ambulatory Visit (HOSPITAL_BASED_OUTPATIENT_CLINIC_OR_DEPARTMENT_OTHER): Payer: Medicare Other | Admitting: Physical Therapy

## 2022-04-03 DIAGNOSIS — R262 Difficulty in walking, not elsewhere classified: Secondary | ICD-10-CM

## 2022-04-03 DIAGNOSIS — M25561 Pain in right knee: Secondary | ICD-10-CM | POA: Diagnosis not present

## 2022-04-03 DIAGNOSIS — G8929 Other chronic pain: Secondary | ICD-10-CM

## 2022-04-03 DIAGNOSIS — M25562 Pain in left knee: Secondary | ICD-10-CM | POA: Diagnosis not present

## 2022-04-03 DIAGNOSIS — M6281 Muscle weakness (generalized): Secondary | ICD-10-CM | POA: Diagnosis not present

## 2022-04-03 NOTE — Therapy (Addendum)
OUTPATIENT PHYSICAL THERAPY LOWER EXTREMITY TREATMENT   Patient Name: Marisa Orozco MRN: [id number] DOB:1950/09/11, 71 y.o., female Today's Date: 04/03/2022  END OF SESSION:  PT End of Session - 04/03/22 1522     Visit Number 6    Number of Visits 13    Date for PT Re-Evaluation 05/31/22    Authorization Type MCR    Progress Note Due on Visit 10    PT Start Time 1516    PT Stop Time 1556    PT Time Calculation (min) 40 min    Activity Tolerance Patient tolerated treatment well    Behavior During Therapy [facility] for tasks assessed/performed                  Past Medical History:  Diagnosis Date   Allergy    Anxiety    Cancer ([facility])    basal cell carcinoma - face   Dermatomyositis ([facility]) 02/04/2016   [place] 1 Positive  Skin Bx- Subtle interface Dermatitis    Former smoker 02/04/2016   GERD (gastroesophageal reflux disease)    otc med prn   PONV (postoperative nausea and vomiting)    Raynaud's disease    Sjogren's disease ([facility]) 02/04/2016   Positive ANA, Positive Ro, Positive CCP, Negative RF, Parotitis, Positive Sicca   Sjogren's syndrome ([facility])    from MD note   Past Surgical History:  Procedure Laterality Date   BREAST SURGERY     augmentation   BUNIONECTOMY     right foot   COLONOSCOPY     DIAGNOSTIC LAPAROSCOPY     DILATION AND CURETTAGE OF UTERUS  04/15/2011   Procedure: DILATATION AND CURETTAGE;  Surgeon: Levi Aland;  Location: [facility];  Service: Gynecology;  Laterality: N/A;   IR RADIOLOGIST EVAL & MGMT  02/04/2021   MUSCLE BIOPSY Left 01/02/2016   Procedure: MUSCLE BIOPSY LEFT DELTOID;  Surgeon: Avel Peace, MD;  Location: [facility];  Service: General;  Laterality: Left;  left deltoid muscle bx   NASAL SEPTUM SURGERY     SVD     x 2   TONSILLECTOMY     WISDOM TOOTH EXTRACTION     Patient Active Problem List   Diagnosis Date Noted   Palpitations 01/15/2021   Hypothyroidism 07/26/2019   Unilateral primary osteoarthritis, right  knee 02/03/2018   Primary osteoarthritis of both hands 09/08/2017   Sjogren's disease ([facility]) 02/04/2016   Dermatomyositis ([facility]) 02/04/2016   Former smoker 02/04/2016   Thymoma 02/04/2016   Fatigue 02/15/2012    PCP: Richardean Chimera, MD   REFERRING PROVIDER: Pollyann Savoy, MD   REFERRING DIAG:  M17.0 (ICD-10-CM) - Primary osteoarthritis of both knees  M25.561,G89.29 (ICD-10-CM) - Chronic pain of right knee    THERAPY DIAG:  Chronic pain of both knees  Muscle weakness (generalized)  Difficulty walking  Rationale for Evaluation and Treatment: Rehabilitation  ONSET DATE: June 2023  SUBJECTIVE:   SUBJECTIVE STATEMENT:  Doing well, had to cancel 2 appointments but I didn't keep up with my exercises good because of the holidays. The knee is feeling OK, I still use the [place] brace. Steps are still hard for me both going up and down, there's almost a puffy feeling to the knee. I do feel like its headed the right direction in general.    PERTINENT HISTORY: Dermatomyositis, L tibial stress fracture in 2022  PAIN:  Are you having pain? no: NPRS scale:0/10   PRECAUTIONS: None  WEIGHT BEARING RESTRICTIONS: No  FALLS:  Has patient fallen in last 6 months? 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  No chondrocalcinosis was noted.  Moderate  patellofemoral narrowing was noted.  Radiographic progression was noted  when compared to the x-rays of 2019.   Impression: These findings are consistent with severe osteoarthritis and  moderate chondromalacia patella.      TODAY'S TREATMENT:     04/03/22  TherEx  Nustep L5x6 minutes BLEs only PPT x20 with 5 second holds Walking bridges x10 Bridges with side steps x10  PPT with 90/90 taps x15 Sidelying hip hikes x10 B Sidelying hip hikes + ABD x10 B Sidelying hip hikes + CW/CCW circles x10 each direction B Sidestep + hip hike x10 steps B 2 rounds against wall Forward and lateral step ups 4 inch box x10 B  Forward lunges 6 inch box x10 B  Gastroc stretches 2x30 seconds B in doorframe Forward step downs off 4 inch box x10 B Power tower: hip abduction 5# x10 B, hip extension 5# x10 R LE, unable to tolerate stance phase on R LE to do exercise on the L  Single LE leg press R LE 50# x10        03/23/22  TherEx      Nustep L5 x6 minutes BLEs only  PPT 20x5 second holds PPT with march x15 PPT with 90/90 taps x15  PPT with SLR x12 B  Curl ups x10 with 2 second holds  Sidelying hip ABD x15 green TB around knees Quadruped hip ABD x12 green TB Quadruped hip extension x12 B green TB  Resisted hip hikes x15 green TB Forward step ups on 2 inch box x15 B Lateral step ups on 2 inch box x15 B Step downs on 2 inch box x10 B Forward lunges onto 6 inch box x5 B                                                                                                           03/20/22  TherEx  Nustep L5 x6 minutes   Walking bridges x15  Sidelying hip ABD green TB x12 B Quadruped hip ABD and extension against green TB x10 B Hip hikes x10 B Hip hike 3 way x10 B Forward step ups 6 inch box U UE support x10 Lateral step ups x10 4 inch box BUE support Step downs from 4 inch box x10B with TA set  PPT 1x15 3 second holds  PPT with march x10  Supine SLR with PPT x10 B   PATIENT EDUCATION:  Education details:  exercise form/purpose Person educated: Patient Education method: Explanation, Demonstration, Tactile cues, Verbal cues, and Handouts Education comprehension: verbalized understanding and  returned demonstration  HOME EXERCISE PROGRAM:  Access Code: [email or web address] URL: [email or web address] Date: 03/23/2022 Prepared by: Deniece Ree  Exercises - Sit to Stand with Resistance Around Legs  - 1 x daily - 5 x weekly - 3 sets - 10 reps - Side Stepping with Resistance at Thighs  - 1 x daily - 5 x weekly - 1 sets - 3 reps - 28ft hold - Sitting Knee Extension with Resistance  - 1 x daily - 5 x weekly - 2 sets - 10 reps - Supine Active Straight Leg Raise  - 1 x daily - 5-6 x weekly - 2 sets - 10 reps - Sidelying Hip Abduction  - 1 x daily - 5-6 x weekly - 2 sets - 10 reps - Standing Hip Hiking  - 1 x daily - 7 x weekly - 3 sets - 15 reps - Bridge Walk Out  - 1 x daily - 7 x weekly - 3 sets - 10 reps - Supine Posterior Pelvic Tilt  - 1 x daily - 7 x weekly - 3 sets - 10 reps - Supine 90/90 Alternating Heel Touches with Posterior Pelvic Tilt  - 1 x daily - 7 x weekly - 3 sets - 10 reps - Curl Up with Reach  - 1 x daily - 7 x weekly - 3 sets - 10 reps  ASSESSMENT:  CLINICAL IMPRESSION:  [name] arrives today doing OK, had a hard time keeping up with HEP over the holidays and had to cancel a couple of appointments around this time as well. Continued working on general hip strength progression and progression of CKC based activities. Will continue efforts    OBJECTIVE IMPAIRMENTS decreased mobility, difficulty walking, decreased ROM, decreased strength, increased muscle spasms, improper body mechanics, postural dysfunction, and pain.    ACTIVITY LIMITATIONS lifting, sitting, standing, squatting, stairs, transfers, locomotion level   PARTICIPATION LIMITATIONS: cleaning, laundry, driving, shopping, community activity, occupation, yard work, and exercise   PERSONAL FACTORS Age, Fitness, Time since onset of injury/illness/exacerbation, and 1-3comorbidity:    are also affecting patient's functional outcome.    REHAB POTENTIAL: Fair   CLINICAL DECISION MAKING:  Stable/uncomplicated   EVALUATION COMPLEXITY: Low     GOALS:     SHORT TERM GOALS: Target date: 04/13/2022  Pt will become independent with HEP in order to demonstrate synthesis of PT education..   Goal status: INITIAL   2.  Pt will be able to demonstrate descending stair pattern without pain in order to demonstrate functional improvement in LE strength and house hold mobility.    Goal status: INITIAL   3.  Pt will be able to demonstrate/report ability to sit/stand/sleep for extended periods of time without pain in order to demonstrate functional improvement and tolerance to static positioning.    Goal status: INITIAL     LONG TERM GOALS: Target date: 05/25/2022    Pt  will become independent with final HEP in order to demonstrate synthesis of PT education.   Goal status: INITIAL   2.  Pt will score >/= 58 on FOTO to demonstrate improvement in perceived bilat knee function.    Goal status: INITIAL   3.  Pt will be able to demonstrate full depth squat without pain in order to demonstrate functional improvement in LE function for self-care and house hold duties. .    Goal status: INITIAL   4.  Pt will be able to demonstrate/report ability to walk >25 mins without pain in order to demonstrate functional improvement and  tolerance to exercise and community mobility.    Goal status: INITIAL       PLAN: PT FREQUENCY: 1-2x/week   PT DURATION: 12 weeks (likely DC in 8)   PLANNED INTERVENTIONS: Therapeutic exercises, Therapeutic activity, Neuromuscular re-education, Balance training, Gait training, Patient/Family education, Self Care, Joint mobilization, Stair training, Aquatic Therapy, Dry Needling, Electrical stimulation, Spinal mobilization, Cryotherapy, Moist heat, Taping, Ultrasound, Ionotophoresis 4mg /ml Dexamethasone, and Manual therapy    PLAN FOR NEXT SESSION: continue with quad and hip strength, check HS, fig 4 bridge holds; edu about neutral footwear to promote better  knee alignment.  Review HEP.  Discharge done by Carolyne Littles PT DPT   PHYSICAL THERAPY DISCHARGE SUMMARY  Visits from Start of Care: 6  Current functional level related to goals / functional outcomes: Per notes patient progressing but did not return for visits   Remaining deficits: Unknown   Education / Equipment: HEP    Patient agrees to discharge. Patient goals were partially met. Patient is being discharged due to not returning since the last visit.   Ann Lions PT DPT PN2

## 2022-04-14 DIAGNOSIS — D485 Neoplasm of uncertain behavior of skin: Secondary | ICD-10-CM | POA: Diagnosis not present

## 2022-04-14 DIAGNOSIS — D2262 Melanocytic nevi of left upper limb, including shoulder: Secondary | ICD-10-CM | POA: Diagnosis not present

## 2022-04-14 DIAGNOSIS — L818 Other specified disorders of pigmentation: Secondary | ICD-10-CM | POA: Diagnosis not present

## 2022-04-14 DIAGNOSIS — L821 Other seborrheic keratosis: Secondary | ICD-10-CM | POA: Diagnosis not present

## 2022-04-14 DIAGNOSIS — M3390 Dermatopolymyositis, unspecified, organ involvement unspecified: Secondary | ICD-10-CM | POA: Diagnosis not present

## 2022-04-14 DIAGNOSIS — D2272 Melanocytic nevi of left lower limb, including hip: Secondary | ICD-10-CM | POA: Diagnosis not present

## 2022-04-14 DIAGNOSIS — D225 Melanocytic nevi of trunk: Secondary | ICD-10-CM | POA: Diagnosis not present

## 2022-04-14 DIAGNOSIS — D2261 Melanocytic nevi of right upper limb, including shoulder: Secondary | ICD-10-CM | POA: Diagnosis not present

## 2022-04-14 DIAGNOSIS — Z85828 Personal history of other malignant neoplasm of skin: Secondary | ICD-10-CM | POA: Diagnosis not present

## 2022-04-14 DIAGNOSIS — D2271 Melanocytic nevi of right lower limb, including hip: Secondary | ICD-10-CM | POA: Diagnosis not present

## 2022-04-17 DIAGNOSIS — E7849 Other hyperlipidemia: Secondary | ICD-10-CM | POA: Diagnosis not present

## 2022-04-17 DIAGNOSIS — C37 Malignant neoplasm of thymus: Secondary | ICD-10-CM | POA: Diagnosis not present

## 2022-04-17 DIAGNOSIS — K2100 Gastro-esophageal reflux disease with esophagitis, without bleeding: Secondary | ICD-10-CM | POA: Diagnosis not present

## 2022-04-17 DIAGNOSIS — R7301 Impaired fasting glucose: Secondary | ICD-10-CM | POA: Diagnosis not present

## 2022-04-17 DIAGNOSIS — Z0001 Encounter for general adult medical examination with abnormal findings: Secondary | ICD-10-CM | POA: Diagnosis not present

## 2022-04-17 DIAGNOSIS — K7581 Nonalcoholic steatohepatitis (NASH): Secondary | ICD-10-CM | POA: Diagnosis not present

## 2022-04-17 DIAGNOSIS — E039 Hypothyroidism, unspecified: Secondary | ICD-10-CM | POA: Diagnosis not present

## 2022-04-17 DIAGNOSIS — M3501 Sicca syndrome with keratoconjunctivitis: Secondary | ICD-10-CM | POA: Diagnosis not present

## 2022-04-17 DIAGNOSIS — E119 Type 2 diabetes mellitus without complications: Secondary | ICD-10-CM | POA: Diagnosis not present

## 2022-04-22 ENCOUNTER — Other Ambulatory Visit: Payer: Self-pay | Admitting: Rheumatology

## 2022-04-22 ENCOUNTER — Encounter: Payer: Self-pay | Admitting: Rheumatology

## 2022-04-24 DIAGNOSIS — R030 Elevated blood-pressure reading, without diagnosis of hypertension: Secondary | ICD-10-CM | POA: Diagnosis not present

## 2022-04-24 DIAGNOSIS — K7581 Nonalcoholic steatohepatitis (NASH): Secondary | ICD-10-CM | POA: Diagnosis not present

## 2022-04-24 DIAGNOSIS — M3390 Dermatopolymyositis, unspecified, organ involvement unspecified: Secondary | ICD-10-CM | POA: Diagnosis not present

## 2022-04-24 DIAGNOSIS — G43109 Migraine with aura, not intractable, without status migrainosus: Secondary | ICD-10-CM | POA: Diagnosis not present

## 2022-04-24 DIAGNOSIS — C37 Malignant neoplasm of thymus: Secondary | ICD-10-CM | POA: Diagnosis not present

## 2022-04-24 DIAGNOSIS — R748 Abnormal levels of other serum enzymes: Secondary | ICD-10-CM | POA: Diagnosis not present

## 2022-04-24 DIAGNOSIS — F410 Panic disorder [episodic paroxysmal anxiety] without agoraphobia: Secondary | ICD-10-CM | POA: Diagnosis not present

## 2022-04-24 DIAGNOSIS — E8881 Metabolic syndrome: Secondary | ICD-10-CM | POA: Diagnosis not present

## 2022-04-24 DIAGNOSIS — M1712 Unilateral primary osteoarthritis, left knee: Secondary | ICD-10-CM | POA: Diagnosis not present

## 2022-04-24 DIAGNOSIS — Z0001 Encounter for general adult medical examination with abnormal findings: Secondary | ICD-10-CM | POA: Diagnosis not present

## 2022-04-24 DIAGNOSIS — I700 Atherosclerosis of aorta: Secondary | ICD-10-CM | POA: Diagnosis not present

## 2022-04-24 DIAGNOSIS — E7849 Other hyperlipidemia: Secondary | ICD-10-CM | POA: Diagnosis not present

## 2022-04-24 DIAGNOSIS — R7301 Impaired fasting glucose: Secondary | ICD-10-CM | POA: Diagnosis not present

## 2022-04-26 ENCOUNTER — Other Ambulatory Visit: Payer: Self-pay | Admitting: Rheumatology

## 2022-04-27 ENCOUNTER — Telehealth: Payer: Self-pay | Admitting: *Deleted

## 2022-04-27 NOTE — Telephone Encounter (Signed)
Labs received from:Dr. Donzetta Sprung  Drawn on:04/17/2022  Reviewed by:Dr. Pollyann Savoy  Labs drawn:CBC, CMP  Results:Granulocytes # 6.1   Lymphocytes # 1.5   Lymphocytes %1 19.0   Granulocytes % 1 76.6  Chloride 109  Patient is on MTX 0.8 mL SQ weekly, PLQ 200 mg po BID M-F and Prednisone 3 mg po daily.

## 2022-04-27 NOTE — Telephone Encounter (Signed)
Next Visit: 05/15/2022   Last Visit: 01/30/2022  Dx: Raynaud's disease without gangrene   Current Dose per office note on 01/30/2022: amlodipine 5 mg p.o. daily   Last Fill: 01/30/2022  Okay to refill Amlodipine?

## 2022-05-01 NOTE — Progress Notes (Signed)
Office Visit Note  Patient: Marisa Orozco             Date of Birth: 1950-05-15           MRN: [id number]             PCP: Caryl Bis, MD Referring: Caryl Bis, MD Visit Date: 05/15/2022 Occupation: @GUAROCC @  Subjective:  Medication management   History of Present Illness: Marisa Orozco is a 72 y.o. femaleWith history of she denies any increased muscular weakness or muscle pain.  She decreased prednisone from 3 mg to 2 mg p.o. daily after 5 weeks and then she recently reduced it to 1.5 mg p.o. daily.  She has not noticed any increased muscle weakness or tenderness.  She continues to take methotrexate and hydroxychloroquine on a regular basis.  Patient states she had a dark spot on her back and she was seen by dermatologist.  She had a skin biopsy which was nonspecific.  She has been experiencing some congestion.    Activities of Daily Living:  Patient reports morning stiffness for 0 minute.   Patient Denies nocturnal pain.  Difficulty dressing/grooming: Denies Difficulty climbing stairs: Reports Difficulty getting out of chair: Denies Difficulty using hands for taps, buttons, cutlery, and/or writing: Denies  Review of Systems  Constitutional:  Negative for fatigue.  HENT:  Positive for mouth dryness. Negative for mouth sores.   Eyes:  Negative for dryness.  Respiratory:  Negative for shortness of breath.   Cardiovascular:  Negative for chest pain and palpitations.  Gastrointestinal:  Negative for blood in stool, constipation and diarrhea.  Endocrine: Negative for increased urination.  Genitourinary:  Negative for involuntary urination.  Musculoskeletal:  Positive for joint pain, joint pain, myalgias, muscle weakness, muscle tenderness and myalgias. Negative for gait problem, joint swelling and morning stiffness.  Skin:  Positive for sensitivity to sunlight. Negative for color change, rash and hair loss.  Allergic/Immunologic: Negative for susceptible to  infections.  Neurological:  Negative for dizziness and headaches.  Hematological:  Negative for swollen glands.  Psychiatric/Behavioral:  Positive for sleep disturbance. Negative for depressed mood. The patient is nervous/anxious.     PMFS History:  Patient Active Problem List   Diagnosis Date Noted   Palpitations 01/15/2021   Hypothyroidism 07/26/2019   Unilateral primary osteoarthritis, right knee 02/03/2018   Primary osteoarthritis of both hands 09/08/2017   Sjogren's disease ([place]) 02/04/2016   Dermatomyositis ([place]) 02/04/2016   Former smoker 02/04/2016   Thymoma 02/04/2016   Fatigue 02/15/2012    Past Medical History:  Diagnosis Date   Allergy    Anxiety    Cancer ([place])    basal cell carcinoma - face   Dermatomyositis ([place]) 02/04/2016   Jo 1 Positive  Skin Bx- Subtle interface Dermatitis    Former smoker 02/04/2016   GERD (gastroesophageal reflux disease)    otc med prn   PONV (postoperative nausea and vomiting)    Raynaud's disease    Sjogren's disease ([place]) 02/04/2016   Positive ANA, Positive Ro, Positive CCP, Negative RF, Parotitis, Positive Sicca   Sjogren's syndrome ([place])    from MD note    Family History  Problem Relation Age of Onset   COPD Mother    Heart disease Mother    Diabetes Father    Kidney disease Father    Epilepsy Daughter    Rectal cancer Maternal Uncle    Esophageal cancer Neg Hx    Stomach cancer Neg Hx  Ulcerative colitis Neg Hx    Past Surgical History:  Procedure Laterality Date   BREAST SURGERY     augmentation   BUNIONECTOMY     right foot   COLONOSCOPY     DIAGNOSTIC LAPAROSCOPY     DILATION AND CURETTAGE OF UTERUS  04/15/2011   Procedure: DILATATION AND CURETTAGE;  Surgeon: [name];  Location: [facility];  Service: Gynecology;  Laterality: N/A;   IR RADIOLOGIST EVAL & MGMT  02/04/2021   MUSCLE BIOPSY Left 01/02/2016   Procedure: MUSCLE BIOPSY LEFT DELTOID;  Surgeon: [name], MD;  Location: [facility];  Service: General;  Laterality: Left;  left deltoid muscle bx   NASAL SEPTUM SURGERY     SVD     x 2   TONSILLECTOMY     WISDOM TOOTH EXTRACTION     Social History   Social History Narrative   Not on file   Immunization History  Administered Date(s) Administered   Influenza, High Dose Seasonal PF 01/07/2018   Influenza-Unspecified 01/04/2014   PFIZER(Purple Top)SARS-COV-2 Vaccination 04/26/2019, 05/17/2019, 12/12/2019   Pneumococcal Conjugate-13 01/05/2016   Zoster Recombinat (Shingrix) 08/11/2017, 12/17/2017     Objective: Vital Signs: BP 134/77 (BP Location: Right Arm, Patient Position: Sitting, Cuff Size: Normal)   Pulse 81   Resp 16   Ht 5\' 6"  (1.676 m)   Wt 155 lb (70.3 kg)   BMI 25.02 kg/m    Physical Exam Vitals and nursing note reviewed.  Constitutional:      Appearance: She is well-developed.  HENT:     Head: Normocephalic and atraumatic.  Eyes:     Conjunctiva/sclera: Conjunctivae normal.  Cardiovascular:     Rate and Rhythm: Normal rate and regular rhythm.     Heart sounds: Normal heart sounds.  Pulmonary:     Effort: Pulmonary effort is normal.     Breath sounds: Normal breath sounds.     Comments: Crackles in bilateral lung bases Abdominal:     General: Bowel sounds are normal.     Palpations: Abdomen is soft.  Musculoskeletal:     Cervical back: Normal range of motion.  Lymphadenopathy:     Cervical: No cervical adenopathy.  Skin:    General: Skin is warm and dry.     Capillary Refill: Capillary refill takes less than 2 seconds.  Neurological:     Mental Status: She is alert and oriented to person, place, and time.  Psychiatric:        Behavior: Behavior normal.      Musculoskeletal Exam: Cervical, thoracic and lumbar spine were in good range of motion.  Shoulder joints, elbow joints, wrist joints, MCPs PIPs and DIPs were in good range of motion with no synovitis.  Hip joints, knee joints, ankles, MTPs and PIPs been good range of  motion with no synovitis.  She had no muscular weakness or tenderness.  CDAI Exam: CDAI Score: -- Patient Global: --; Provider Global: -- Swollen: --; Tender: -- Joint Exam 05/15/2022   No joint exam has been documented for this visit   There is currently no information documented on the homunculus. Go to the Rheumatology activity and complete the homunculus joint exam.  Investigation: No additional findings.  Imaging: No results found.  Recent Labs: Lab Results  Component Value Date   WBC 10.9 (H) 12/02/2021   HGB 14.9 12/02/2021   PLT 184 12/02/2021   NA 140 12/02/2021   K 4.5 12/02/2021   CL 106 12/02/2021  CO2 24 12/02/2021   GLUCOSE 83 12/02/2021   BUN 21 12/02/2021   CREATININE 0.74 12/02/2021   BILITOT 0.4 12/02/2021   ALKPHOS 44 10/12/2016   AST 19 12/02/2021   ALT 15 12/02/2021   PROT 7.0 12/02/2021   ALBUMIN 4.2 10/12/2016   CALCIUM 9.3 12/02/2021   GFRAA 87 06/25/2020   QFTBGOLDPLUS NEGATIVE 07/07/2017    Speciality Comments: PLQ Eye Exam: 02/23/2022 WNL @ [facility]. Follow up 1 year.   Fosamax May 08, 2021  Procedures:  No procedures performed Allergies: Codeine, Flagyl [metronidazole hcl], Nexium [esomeprazole magnesium], Imuran [azathioprine], Sulfa antibiotics, and Sulfa drugs cross reactors   Assessment / Plan:     Visit Diagnoses: Dermatomyositis ([place]) - Jo 1+, Positive muscle biopsy for dermatomyositis: She has been doing well on the combination of methotrexate 0.8 mL subcutaneous weekly and folic acid 2 mg p.o. daily.  She has been tapering prednisone gradually and she is currently on prednisone 1.5 mg p.o. daily.  She has not noticed any increased muscular weakness or tenderness.  She denies any increased joint pain or stiffness.  High risk medication use - MTX 0.8 ml every 7 days, folic acid 2mg  po qd, Plaquenil 200 mg BID M-F,prednisone 3.0 mg every day. PLQ Eye Exam: 02/23/2022.  Labs obtained at her PCPs office from  April 27, 2022 showed CBC and CMP were normal.  CK was 101.  She was advised to get repeat labs in 3 months and then every 3 months to monitor for drug toxicity.  Information on immunization was placed in the AVS.  Sjogren's syndrome with keratoconjunctivitis sicca (HCC)-she has dry mouth and dry eye symptoms.  Over-the-counter products were reviewed.  Lung crackles--she had crackles in her bilateral lung bases today.  She had a CT scan of her chest in 2020 which was unremarkable.  She denies any shortness of breath.  I will refer her to ILD clinic for evaluation and PFTs.  Long term (current) use of systemic steroids-she has been on tapering dose of prednisone.  She is currently on prednisone by 5 mg p.o. daily.  Primary osteoarthritis of both hands-she had mild PIP and DIP thickening.  No synovitis was noted.  Chronic pain of right knee-she is not in pain.  No warmth swelling or effusion was noted.  Primary osteoarthritis of both knees-she had good range of motion of bilateral knee joints today.  Raynaud's disease without gangrene -she continues to have Raynaud's phenomenon especially during the wintertime.  She had been using hand warmers.  She states she was advised to come off amlodipine by her PCP.  She is not taking amlodipine currently.  Anxiety-she was given Zoloft for anxiety which she is taking currently.  Age-related osteoporosis without current pathological fracture - DEXA on 06/02/2018: AP spine T score -2.7 with BMD 0.95.  -15.2% change from previous DEXA.  Patient states that she had a repeat DEXA with a T score of -2.2.  Patient has been on Fosamax since May 08, 2021.  Advised her to get repeat DEXA scan in 2025.  History of vitamin D deficiency-vitamin D was 42 on December 07, 2017.  Thymoma  Former smoker  Orders: Orders Placed This Encounter  Procedures   Ambulatory referral to Pulmonology   No orders of the defined types were placed in this  encounter.    Follow-Up Instructions: Return in about 3 months (around 08/13/2022) for Dermatomyositis, Osteoarthritis, Osteoporosis.   Bo Merino, MD  Note - This record has been  created using [employer].  Chart creation errors have been sought, but may not always  have been located. Such creation errors do not reflect on  the standard of medical care.

## 2022-05-05 ENCOUNTER — Ambulatory Visit: Payer: Medicare Other | Admitting: "Endocrinology

## 2022-05-15 ENCOUNTER — Telehealth: Payer: Self-pay | Admitting: *Deleted

## 2022-05-15 ENCOUNTER — Ambulatory Visit: Payer: Medicare Other | Attending: Rheumatology | Admitting: Rheumatology

## 2022-05-15 ENCOUNTER — Encounter: Payer: Self-pay | Admitting: Rheumatology

## 2022-05-15 VITALS — BP 134/77 | HR 81 | Resp 16 | Ht 66.0 in | Wt 155.0 lb

## 2022-05-15 DIAGNOSIS — Z87891 Personal history of nicotine dependence: Secondary | ICD-10-CM

## 2022-05-15 DIAGNOSIS — Z8639 Personal history of other endocrine, nutritional and metabolic disease: Secondary | ICD-10-CM

## 2022-05-15 DIAGNOSIS — D4989 Neoplasm of unspecified behavior of other specified sites: Secondary | ICD-10-CM | POA: Diagnosis not present

## 2022-05-15 DIAGNOSIS — M810 Age-related osteoporosis without current pathological fracture: Secondary | ICD-10-CM

## 2022-05-15 DIAGNOSIS — M19041 Primary osteoarthritis, right hand: Secondary | ICD-10-CM | POA: Diagnosis not present

## 2022-05-15 DIAGNOSIS — M25561 Pain in right knee: Secondary | ICD-10-CM | POA: Diagnosis not present

## 2022-05-15 DIAGNOSIS — I7300 Raynaud's syndrome without gangrene: Secondary | ICD-10-CM

## 2022-05-15 DIAGNOSIS — M19042 Primary osteoarthritis, left hand: Secondary | ICD-10-CM

## 2022-05-15 DIAGNOSIS — M170 Bilateral primary osteoarthritis of knee: Secondary | ICD-10-CM | POA: Diagnosis not present

## 2022-05-15 DIAGNOSIS — G8929 Other chronic pain: Secondary | ICD-10-CM | POA: Diagnosis not present

## 2022-05-15 DIAGNOSIS — M3390 Dermatopolymyositis, unspecified, organ involvement unspecified: Secondary | ICD-10-CM

## 2022-05-15 DIAGNOSIS — R0989 Other specified symptoms and signs involving the circulatory and respiratory systems: Secondary | ICD-10-CM

## 2022-05-15 DIAGNOSIS — M3501 Sicca syndrome with keratoconjunctivitis: Secondary | ICD-10-CM

## 2022-05-15 DIAGNOSIS — Z7952 Long term (current) use of systemic steroids: Secondary | ICD-10-CM

## 2022-05-15 DIAGNOSIS — Z79899 Other long term (current) drug therapy: Secondary | ICD-10-CM

## 2022-05-15 DIAGNOSIS — M3313 Other dermatomyositis without myopathy: Secondary | ICD-10-CM

## 2022-05-15 DIAGNOSIS — F419 Anxiety disorder, unspecified: Secondary | ICD-10-CM | POA: Diagnosis not present

## 2022-05-15 NOTE — Telephone Encounter (Signed)
error 

## 2022-05-15 NOTE — Patient Instructions (Signed)
Standing Labs We placed an order today for your standing lab work.   Please have your standing labs drawn in April and every 3 months   Please have your labs drawn 2 weeks prior to your appointment so that the provider can discuss your lab results at your appointment.  Please note that you may see your imaging and lab results in [place] before we have reviewed them. We will contact you once all results are reviewed. Please allow our office up to 72 hours to thoroughly review all of the results before contacting the office for clarification of your results.  Lab hours are:   Monday through Thursday from 8:00 am -12:30 pm and 1:00 pm-5:00 pm and Friday from 8:00 am-12:00 pm.  Please be advised, all patients with office appointments requiring lab work will take precedent over walk-in lab work.   Labs are drawn by [facility]. Please bring your co-pay at the time of your lab draw.  You may receive a bill from [facility] for your lab work.  Please note if you are on Hydroxychloroquine and and an order has been placed for a Hydroxychloroquine level, you will need to have it drawn 4 hours or more after your last dose.  If you wish to have your labs drawn at another location, please call the office 24 hours in advance so we can fax the orders.  The office is located at [address], [place], [place], [place] [address] No appointment is necessary.    If you have any questions regarding directions or hours of operation,  please call [phone].   As a reminder, please drink plenty of water prior to coming for your lab work. Thanks!   Vaccines You are taking a medication(s) that can suppress your immune system.  The following immunizations are recommended: Flu annually Covid-19  Td/Tdap (tetanus, diphtheria, pertussis) every 10 years Pneumonia (Prevnar 15 then Pneumovax 23 at least 1 year apart.  Alternatively, can take Prevnar 20 without needing additional dose) Shingrix: 2 doses from 4 weeks  to 6 months apart  Please check with your PCP to make sure you are up to date.   If you have signs or symptoms of an infection or start antibiotics: First, call your PCP for workup of your infection. Hold your medication through the infection, until you complete your antibiotics, and until symptoms resolve if you take the following: Injectable medication (Actemra, Benlysta, Cimzia, Cosentyx, Enbrel, Humira, Kevzara, Orencia, Remicade, Simponi, Stelara, Taltz, Tremfya) Methotrexate Leflunomide (Arava) Mycophenolate (Cellcept) Morrie Sheldon, Olumiant, or Rinvoq

## 2022-05-20 ENCOUNTER — Encounter: Payer: Self-pay | Admitting: Cardiology

## 2022-05-20 ENCOUNTER — Ambulatory Visit: Payer: Medicare Other | Attending: Cardiology | Admitting: Cardiology

## 2022-05-20 VITALS — BP 142/90 | HR 79 | Ht 66.0 in | Wt 156.8 lb

## 2022-05-20 DIAGNOSIS — M3390 Dermatopolymyositis, unspecified, organ involvement unspecified: Secondary | ICD-10-CM | POA: Insufficient documentation

## 2022-05-20 DIAGNOSIS — M3501 Sicca syndrome with keratoconjunctivitis: Secondary | ICD-10-CM | POA: Diagnosis not present

## 2022-05-20 DIAGNOSIS — R002 Palpitations: Secondary | ICD-10-CM | POA: Diagnosis not present

## 2022-05-20 MED ORDER — AMLODIPINE BESYLATE 2.5 MG PO TABS: 2.5000 mg | ORAL_TABLET | Freq: Every day | ORAL | 3 refills | Status: DC

## 2022-05-20 NOTE — Patient Instructions (Signed)
Medication Instructions:  Your physician has recommended you make the following change in your medication:   1) START amlodipine 2.5mg  once daily  *If you need a refill on your cardiac medications before your next appointment, please call your pharmacy*  Lab Work: None  Testing/Procedures: None  Follow-Up: At [facility], you and your health needs are our priority.  As part of our continuing mission to provide you with exceptional heart care, we have created designated Provider Care Teams.  These Care Teams include your primary Cardiologist (physician) and Advanced Practice Providers (APPs -  Physician Assistants and Nurse Practitioners) who all work together to provide you with the care you need, when you need it.  Your next appointment:   1 year(s)  Provider:   Candee Furbish, MD

## 2022-05-20 NOTE — Progress Notes (Signed)
Cardiology Office Note:    Date:  05/20/2022   ID:  Marisa Orozco, DOB [birth year], MRN [id number]  PCP:  Caryl Bis, MD   [facility] Providers Cardiologist:  Candee Furbish, MD     Referring MD: Caryl Bis, MD    History of Present Illness:    Marisa Orozco is a 72 y.o. female here for the follow-up of rapid heart rate.  3 separate [employer] health monitor EKGs were transmitted via PDF.  The third was labeled as inconclusive.  It demonstrated sinus rhythm with 3 separate PVCs noted throughout the tracings.  1 other tracing 2/23 at 10:30 PM was labeled atrial fibrillation however this was sinus rhythm with an isolated PVC upon inspection.  Please see encounters, patient messages from 05/26/2021.  She followed up with me previously on 01/15/2021 with palpitations. 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  Previously history of tachycardia palpitations felt to be an inflammatory response to a dermatomyositis flare.    Back in 2017 her echocardiogram was normal with grade 1 diastolic dysfunction and EF of 70%.  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  Followed by Dr. Roxan Orozco.  Did not want it resected.  Overall today she is doing well.  She has been able to go back to at least 2 mg of prednisone.  Her blood pressure has been somewhat elevated at doctors visits.  At home she has noted lower blood pressures but occasionally she can get anxious when taking it and it can increase.  Good job with 20 pound weight loss at home  Denies any chest pain fevers chills nausea vomiting  Past Medical History:  Diagnosis Date   Allergy    Anxiety    Cancer ([place])    basal cell carcinoma - face   Dermatomyositis ([place]) 02/04/2016   Marisa Orozco 1 Positive  Skin Bx- Subtle interface Dermatitis    Former smoker 02/04/2016   GERD (gastroesophageal reflux disease)    otc med prn   PONV (postoperative nausea and  vomiting)    Raynaud's disease    Sjogren's disease ([place]) 02/04/2016   Positive ANA, Positive Ro, Positive CCP, Negative RF, Parotitis, Positive Sicca   Sjogren's syndrome ([place])    from MD note    Past Surgical History:  Procedure Laterality Date   BREAST SURGERY     augmentation   BUNIONECTOMY     right foot   COLONOSCOPY     DIAGNOSTIC LAPAROSCOPY     DILATION AND CURETTAGE OF UTERUS  04/15/2011   Procedure: DILATATION AND CURETTAGE;  Surgeon: [name];  Location: [facility];  Service: Gynecology;  Laterality: N/A;   IR RADIOLOGIST EVAL & MGMT  02/04/2021   MUSCLE BIOPSY Left 01/02/2016   Procedure: MUSCLE BIOPSY LEFT DELTOID;  Surgeon: [name], MD;  Location: [facility];  Service: General;  Laterality: Left;  left deltoid muscle bx   NASAL SEPTUM SURGERY     SVD     x 2   TONSILLECTOMY     WISDOM TOOTH EXTRACTION      Current Medications: Current Meds  Medication Sig   Acetaminophen (TYLENOL PO) Take by mouth as needed.   alendronate (FOSAMAX) 70 MG tablet Take 70 mg by mouth once a week.   amLODipine (NORVASC) 2.5 MG tablet Take 1 tablet (2.5 mg total) by mouth daily.   Cholecalciferol (VITAMIN D3) 1000 UNIT/SPRAY LIQD Take 5 sprays by  mouth daily.    folic acid (FOLVITE) 1 MG tablet TAKE 2 TABLETS BY MOUTH EVERY DAY   hydroxychloroquine (PLAQUENIL) 200 MG tablet TAKE ONE TABLET BY MOUTH TWICE DAILY MONDAY THROUGH FRIDAY   Magnesium Glycinate POWD 400 mg by Does not apply route 2 (two) times daily.   methotrexate 50 MG/2ML injection Inject into the vein once. Per patient taking once weekly   Multiple Vitamin (MULTIVITAMIN PO) Take by mouth daily.   OVER THE COUNTER MEDICATION NUTRAFOL   predniSONE (DELTASONE) 1 MG tablet TAKE 3 TABLETS BY MOUTH DAILY WITH BREAKFAST.   sertraline (ZOLOFT) 50 MG tablet Take 50 mg by mouth daily.   TUBERCULIN SYR 1CC/27GX1/2" 27G X 1/2" 1 ML MISC Patient to use weekly to inject Methotrexate   vitamin C (ASCORBIC  ACID) 250 MG tablet Take 250 mg by mouth daily.   VITAMIN K PO Take 1 tablet by mouth daily.    [DISCONTINUED] amLODipine (NORVASC) 5 MG tablet TAKE 1 TABLET (5 MG TOTAL) BY MOUTH DAILY.   [DISCONTINUED] CALCIUM PO Take 1 tablet by mouth daily.   [DISCONTINUED] Coenzyme Q10 (COQ-10) 100 MG CAPS    [DISCONTINUED] estradiol (ESTRACE) 1 MG tablet Take 1 mg by mouth daily.   [DISCONTINUED] methotrexate 250 MG/10ML injection INJECT 0.8ML INTO THE SKIN ONCE A WEEK   [DISCONTINUED] methotrexate 50 MG/2ML injection Inject 20 mg into the skin once a week.   [DISCONTINUED] sertraline (ZOLOFT) 25 MG tablet Take 25 mg by mouth daily.   [DISCONTINUED] triamcinolone ointment (KENALOG) 0.1 %      Allergies:   Codeine, Flagyl [metronidazole hcl], Nexium [esomeprazole magnesium], Imuran [azathioprine], Sulfa antibiotics, and Sulfa drugs cross reactors   Social History   Socioeconomic History   Marital status: Married    Spouse name: Not on file   Number of children: Not on file   Years of education: Not on file   Highest education level: Not on file  Occupational History   Not on file  Tobacco Use   Smoking status: Former    Packs/day: 1.00    Years: 25.00    Total pack years: 25.00    Types: Cigarettes    Quit date: 08/05/2003    Years since quitting: 18.8    Passive exposure: Never   Smokeless tobacco: Never  Vaping Use   Vaping Use: Never used  Substance and Sexual Activity   Alcohol use: Yes    Comment: socially - wine   Drug use: No   Sexual activity: Yes    Birth control/protection: None  Other Topics Concern   Not on file  Social History Narrative   Not on file   Social Determinants of Health   Financial Resource Strain: Not on file  Food Insecurity: Not on file  Transportation Needs: Not on file  Physical Activity: Not on file  Stress: Not on file  Social Connections: Not on file     Family History: The patient's family history includes COPD in her mother; Diabetes in  her father; Epilepsy in her daughter; Heart disease in her mother; Kidney disease in her father; Rectal cancer in her maternal uncle. There is no history of Esophageal cancer, Stomach cancer, or Ulcerative colitis.  ROS:   Please see the history of present illness.     All other systems reviewed and are negative.  EKGs/Labs/Other Studies Reviewed:     EKG:    05/20/22: Normal sinus rhythm 79 no other abnormalities Prior sinus rhythm 79 no other changes.  Recent  Labs: 12/02/2021: ALT 15; BUN 21; Creat 0.74; Hemoglobin 14.9; Platelets 184; Potassium 4.5; Sodium 140  Recent Lipid Panel    Component Value Date/Time   CHOL 157 04/14/2021 0000   TRIG 86 04/14/2021 0000   HDL 43 04/14/2021 0000   LDLCALC 98 04/14/2021 0000     Risk Assessment/Calculations:              Physical Exam:    VS:  BP (!) 142/90 Comment: Right arm 140/90  Pulse 79   Ht 5\' 6"  (1.676 m)   Wt 156 lb 12.8 oz (71.1 kg)   SpO2 99%   BMI 25.31 kg/m     Wt Readings from Last 3 Encounters:  05/20/22 156 lb 12.8 oz (71.1 kg)  05/15/22 155 lb (70.3 kg)  01/30/22 167 lb 9.6 oz (76 kg)     GEN:  Well nourished, well developed in no acute distress HEENT: Normal NECK: No JVD; No carotid bruits LYMPHATICS: No lymphadenopathy CARDIAC: RRR, no murmurs, no rubs, gallops RESPIRATORY:  Clear to auscultation without rales, wheezing or rhonchi  ABDOMEN: Soft, non-tender, non-distended MUSCULOSKELETAL:  No edema; No deformity  SKIN: Warm and dry NEUROLOGIC:  Alert and oriented x 3 PSYCHIATRIC:  Normal affect   ASSESSMENT:    1. Palpitations   2. Dermatomyositis ([place])   3. Sjogren's syndrome with keratoconjunctivitis sicca (HCC)     PLAN:    In order of problems listed above:   Palpitations PVC's noted on [employer] watch.  When tracing was labeled as atrial fibrillation but this was sinus rhythm.  I showed her the P waves and how the R to R interval's are normal.  Reassurance.  Conservative treatment  strategy.  PVCs can be brought about by lack of sleep, caffeine etc.  Spoke about medications but does not wish to go that route.  I agree.    Dermatomyositis Sees Dr. Estanislado Pandy of rheumatology.  Marisa Orozco 1 positive muscle biopsy for dermatomyositis.  She is on combination of methotrexate as well as folic acid.  Had tapered prednisone.  No increased muscle weakness or tenderness.  Sjogren syndrome Dry mouth dry eye.  Had been referred to pulmonary for evaluation of potential interstitial lung disease.  Prior CT scan in 2020 was unremarkable.  Lung crackles were heard previously by rheumatology.  When I was listening to her, I noticed that when my stethoscope was over her elastic bra strap in the back it did sound crackly.  When placed underneath I did not notice the crackles.  Raynaud's disease She does have some bluish discoloration.  We will go ahead and give her amlodipine 2.5 mg once a day.  When I recheck her blood pressure today in clinic it was 154/84.  Previously it was 142/90.  Certainly there could be a component of whitecoat hypertension.  Obviously if she has orthostatic type symptoms she can always discontinue this medication.  Medication Adjustments/Labs and Tests Ordered: Current medicines are reviewed at length with the patient today.  Concerns regarding medicines are outlined above.  Orders Placed This Encounter  Procedures   EKG 12-Lead   Meds ordered this encounter  Medications   amLODipine (NORVASC) 2.5 MG tablet    Sig: Take 1 tablet (2.5 mg total) by mouth daily.    Dispense:  90 tablet    Refill:  3    Patient Instructions  Medication Instructions:  Your physician has recommended you make the following change in your medication:   1) START amlodipine 2.5mg  once daily  *  If you need a refill on your cardiac medications before your next appointment, please call your pharmacy*  Lab Work: None  Testing/Procedures: None  Follow-Up: At [facility], you  and your health needs are our priority.  As part of our continuing mission to provide you with exceptional heart care, we have created designated Provider Care Teams.  These Care Teams include your primary Cardiologist (physician) and Advanced Practice Providers (APPs -  Physician Assistants and Nurse Practitioners) who all work together to provide you with the care you need, when you need it.  Your next appointment:   1 year(s)  Provider:   Candee Furbish, MD   Signed, Candee Furbish, MD  05/20/2022 10:14 AM    [place]

## 2022-06-04 ENCOUNTER — Other Ambulatory Visit: Payer: Self-pay | Admitting: *Deleted

## 2022-06-04 DIAGNOSIS — M3501 Sicca syndrome with keratoconjunctivitis: Secondary | ICD-10-CM

## 2022-06-04 MED ORDER — HYDROXYCHLOROQUINE SULFATE 200 MG PO TABS: ORAL_TABLET | ORAL | 0 refills | Status: DC

## 2022-06-04 NOTE — Telephone Encounter (Signed)
Next Visit: 08/25/2022  Last Visit: 05/15/2022  Labs: 04/17/2022 Granulocytes # 6.1        Lymphocytes # 1.5        Lymphocytes %1 19.0                Granulocytes % 1 76.6                Chloride 109  Eye exam: 02/23/2022 WNL   Current Dose per office note 05/15/2022: Plaquenil 200 mg BID M-F,   [id number]   Last Fill: 02/11/2022  Okay to refill Plaquenil?

## 2022-06-04 NOTE — Telephone Encounter (Signed)
From: [name] To: Office of Ofilia Neas, [place] Sent: 06/03/2022 8:28 PM EST Subject: Medication Renewal Request  Refills have been requested for the following medications:   hydroxychloroquine (PLAQUENIL) 200 MG tablet [Shaili Deveshwar]  Preferred pharmacy: [facility] # [place], [place] Delivery method: [employer]

## 2022-06-11 ENCOUNTER — Encounter: Payer: Self-pay | Admitting: Pulmonary Disease

## 2022-06-11 ENCOUNTER — Ambulatory Visit (INDEPENDENT_AMBULATORY_CARE_PROVIDER_SITE_OTHER): Payer: Medicare Other | Admitting: Pulmonary Disease

## 2022-06-11 VITALS — BP 138/62 | HR 81 | Temp 98.1°F | Ht 66.0 in | Wt 154.4 lb

## 2022-06-11 DIAGNOSIS — J849 Interstitial pulmonary disease, unspecified: Secondary | ICD-10-CM

## 2022-06-11 NOTE — Progress Notes (Signed)
Marisa Orozco    [id number]    January 06, 1951  Primary Care Physician:Daniel, Mitzie Na, MD  Referring Physician: Bo Merino, MD [address] [place],  [place] [address]  Chief complaint: Consult for interstitial lung disease  HPI: 72 y.o. 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   She has longstanding history of dermatomyositis, Sjogren's syndrome.  Diagnosed initially as a teenager.  She was treated with glucocorticoids at that time.  She had a flareup in 2017 and a repeat muscle biopsy.  She is currently under the care of Dr. Estanislado Pandy and being treated with methotrexate, Plaquenil and prednisone with good control of symptoms.  Dr. Estanislado Pandy noted some lung crackles on examination and sent her for evaluation of interstitial lung disease.  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  She was evaluated by Dr. 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  She has been followed radiographically.  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  No mold, hot tub, Jacuzzi Smoking history: 25-pack-year smoker.  Quit in 2005 Travel history: Previously lived in [place].  No significant recent travel Relevant family history: Mother has COPD.  She was a smoker.  Outpatient Encounter Medications as of 06/11/2022  Medication Sig   Acetaminophen (TYLENOL PO) Take by mouth as needed.   alendronate (FOSAMAX) 70  MG tablet Take 70 mg by mouth once a week.   Cholecalciferol (VITAMIN D3) 1000 UNIT/SPRAY LIQD Take 5 sprays by mouth daily.    folic acid (FOLVITE) 1 MG tablet TAKE 2 TABLETS BY MOUTH EVERY DAY   hydroxychloroquine (PLAQUENIL) 200 MG tablet TAKE ONE TABLET BY MOUTH TWICE DAILY MONDAY THROUGH FRIDAY   Magnesium Glycinate POWD 400 mg by Does not apply route 2 (two) times daily.   methotrexate 50 MG/2ML injection Inject into the vein once. Per patient taking once weekly   Multiple Vitamin (MULTIVITAMIN PO) Take by mouth daily.   OVER THE COUNTER MEDICATION NUTRAFOL   predniSONE (DELTASONE) 1 MG tablet TAKE 3 TABLETS BY MOUTH DAILY WITH BREAKFAST. (Patient taking differently: TAKE 2 TABLETS BY MOUTH DAILY WITH BREAKFAST.)   sertraline (ZOLOFT) 50 MG tablet Take 50 mg by mouth daily.   vitamin C (ASCORBIC ACID) 250 MG tablet Take 250 mg by mouth daily.   VITAMIN K PO Take 1 tablet by mouth daily.    amLODipine (NORVASC) 2.5 MG tablet Take 1 tablet (2.5 mg total) by mouth daily. (Patient not taking: Reported on 06/11/2022)   TUBERCULIN SYR 1CC/27GX1/2" 27G X 1/2" 1 ML MISC Patient to use weekly to inject Methotrexate (Patient not taking: Reported on 06/11/2022)   No facility-administered encounter medications on file as of 06/11/2022.    Allergies as of 06/11/2022 - Review Complete 06/11/2022  Allergen Reaction Noted   Codeine Nausea And Vomiting 04/15/2011   Flagyl [metronidazole hcl] Other (See Comments) 04/15/2011   Nexium [esomeprazole magnesium] Other (See Comments) 04/15/2011   Imuran [azathioprine] Other (See Comments) 06/10/2016   Sulfa antibiotics Rash 04/14/2011  Sulfa drugs cross reactors Rash 04/14/2011    Past Medical History:  Diagnosis Date   Allergy    Anxiety    Cancer (Marisa Orozco)    basal cell carcinoma - face   Dermatomyositis ([place]) 02/04/2016   Marisa Orozco 1 Positive  Skin Bx- Subtle interface Dermatitis    Former smoker 02/04/2016   GERD (gastroesophageal reflux disease)    otc med  prn   PONV (postoperative nausea and vomiting)    Raynaud's disease    Sjogren's disease (Marisa Orozco) 02/04/2016   Positive ANA, Positive Ro, Positive CCP, Negative RF, Parotitis, Positive Sicca   Sjogren's syndrome (Marisa Orozco)    from MD note    Past Surgical History:  Procedure Laterality Date   BREAST SURGERY     augmentation   BUNIONECTOMY     right foot   COLONOSCOPY     DIAGNOSTIC LAPAROSCOPY     DILATION AND CURETTAGE OF UTERUS  04/15/2011   Procedure: DILATATION AND CURETTAGE;  Surgeon: [name];  Location: [facility];  Service: Gynecology;  Laterality: N/A;   IR RADIOLOGIST EVAL & MGMT  02/04/2021   MUSCLE BIOPSY Left 01/02/2016   Procedure: MUSCLE BIOPSY LEFT DELTOID;  Surgeon: [name], MD;  Location: [facility];  Service: General;  Laterality: Left;  left deltoid muscle bx   NASAL SEPTUM SURGERY     SVD     x 2   TONSILLECTOMY     WISDOM TOOTH EXTRACTION      Family History  Problem Relation Age of Onset   COPD Mother    Heart disease Mother    Diabetes Father    Kidney disease Father    Epilepsy Daughter    Rectal cancer Maternal Uncle    Esophageal cancer Neg Hx    Stomach cancer Neg Hx    Ulcerative colitis Neg Hx     Social History   Socioeconomic History   Marital status: Married    Spouse name: Not on file   Number of children: Not on file   Years of education: Not on file   Highest education level: Not on file  Occupational History   Not on file  Tobacco Use   Smoking status: Former    Packs/day: 1.00    Years: 25.00    Total pack years: 25.00    Types: Cigarettes    Quit date: 08/05/2003    Years since quitting: 18.8    Passive exposure: Never   Smokeless tobacco: Never  Vaping Use   Vaping Use: Never used  Substance and Sexual Activity   Alcohol use: Yes    Comment: socially - wine   Drug use: No   Sexual activity: Yes    Birth control/protection: None  Other Topics Concern   Not on file  Social History Narrative    Not on file   Social Determinants of Health   Financial Resource Strain: Not on file  Food Insecurity: Not on file  Transportation Needs: Not on file  Physical Activity: Not on file  Stress: Not on file  Social Connections: Not on file  Intimate Partner Violence: Not on file    Review of systems: Review of Systems  Constitutional: Negative for fever and chills.  HENT: Negative.   Eyes: Negative for blurred vision.  Respiratory: as per HPI  Cardiovascular: Negative for chest pain and palpitations.  Gastrointestinal: Negative for vomiting, diarrhea, blood per rectum. Genitourinary: Negative for dysuria, urgency, frequency and hematuria.  Musculoskeletal: Negative for myalgias,  back pain and joint pain.  Skin: Negative for itching and rash.  Neurological: Negative for dizziness, tremors, focal weakness, seizures and loss of consciousness.  Endo/Heme/Allergies: Negative for environmental allergies.  Psychiatric/Behavioral: Negative for depression, suicidal ideas and hallucinations.  All other systems reviewed and are negative.  Physical Exam: Blood pressure 138/62, pulse 81, temperature 98.1 F (36.7 C), temperature source Oral, height '5\' 6"'$  (1.676 m), weight 154 lb 6.4 oz (70 kg), SpO2 99 %. Gen:      No acute distress HEENT:  EOMI, sclera anicteric Neck:     No masses; no thyromegaly Lungs:   Mild basilar crackles CV:         Regular rate and rhythm; no murmurs Abd:      + bowel sounds; soft, non-tender; no palpable masses, no distension Ext:    No edema; adequate peripheral perfusion Skin:      Warm and dry; no rash Neuro: alert and oriented x 3 Psych: normal mood and affect  Data Reviewed: Imaging: CT abdomen pelvis 01/30/2016- minimal basal reticulation. CT chest 09/30/2018-stable anterior mediastinal soft tissue density, mild basilar fibrotic changes CT abdomen pelvis 12/31/2020-increase in size of basal mild fibrotic changes I have reviewed the images  personally.  PFTs:  Labs:  Assessment:  Evaluation for interstitial lung disease She has history of dermatomyositis and Sjogren's syndrome under good control with a regimen of methotrexate, Plaquenil and prednisone.  There is down pillows in her living room but exposure appears to be minimal.  She does have minimal crackles on examination.  Review of her lung imaging shows basal mild changes of reticulation back in 2017 which appears to have increased on the latest CT abdomen pelvis in 2022.    I reviewed these findings and images in detail with Mrs. Cornette.  Will get a high-resolution CT and PFTs for better evaluation of lungs.  Appearance does not appear to be consistent with hypersensitivity pneumonitis but will check a hypersensitivity panel for completion sake.  Check CBC and CMP I advised her to get rid of the down pillows in the living room to be on the safe side  Return to clinic in 1 month for review of tests and plan for next steps  Plan/Recommendations: - Get rid of down pillows and comfort - High-res CT, PFTs - Check baseline labs, hypersensitivity panel  Marshell Garfinkel MD [facility] 06/11/2022, 3:47 PM  CC: Bo Merino, MD  They are having problems in the front

## 2022-06-11 NOTE — Patient Instructions (Addendum)
Will get high-resolution CT and PFTs for better evaluation of the lungs Check CBC, CMP and hypersensitivity panel Return to clinic in 1 month for review and plan for next steps

## 2022-06-12 LAB — COMPREHENSIVE METABOLIC PANEL
ALT: 13 U/L (ref 0–35)
AST: 16 U/L (ref 0–37)
Albumin: 4 g/dL (ref 3.5–5.2)
Alkaline Phosphatase: 54 U/L (ref 39–117)
BUN: 21 mg/dL (ref 6–23)
CO2: 30 mEq/L (ref 19–32)
Calcium: 9.7 mg/dL (ref 8.4–10.5)
Chloride: 105 mEq/L (ref 96–112)
Creatinine, Ser: 0.7 mg/dL (ref 0.40–1.20)
GFR: 86.96 mL/min (ref >60.00)
Glucose, Bld: 98 mg/dL (ref 70–99)
Potassium: 4.3 mEq/L (ref 3.5–5.1)
Sodium: 142 mEq/L (ref 135–145)
Total Bilirubin: 0.5 mg/dL (ref 0.2–1.2)
Total Protein: 6.7 g/dL (ref 6.0–8.3)

## 2022-06-12 LAB — CBC WITH DIFFERENTIAL/PLATELET
Basophils Absolute: 0.1 10*3/uL (ref 0.0–0.1)
Basophils Relative: 1 % (ref 0.0–3.0)
Eosinophils Absolute: 0.3 10*3/uL (ref 0.0–0.7)
Eosinophils Relative: 2.7 % (ref 0.0–5.0)
HCT: 44.2 % (ref 36.0–46.0)
Hemoglobin: 14.6 g/dL (ref 12.0–15.0)
Lymphocytes Relative: 19.6 % (ref 12.0–46.0)
Lymphs Abs: 1.9 10*3/uL (ref 0.7–4.0)
MCHC: 32.9 g/dL (ref 30.0–36.0)
MCV: 93.2 fl (ref 78.0–100.0)
Monocytes Absolute: 0.8 10*3/uL (ref 0.1–1.0)
Monocytes Relative: 8.2 % (ref 3.0–12.0)
Neutro Abs: 6.6 10*3/uL (ref 1.4–7.7)
Neutrophils Relative %: 68.5 % (ref 43.0–77.0)
Platelets: 187 10*3/uL (ref 150.0–400.0)
RBC: 4.74 Mil/uL (ref 3.87–5.11)
RDW: 15.3 % (ref 11.5–15.5)
WBC: 9.7 10*3/uL (ref 4.0–10.5)

## 2022-06-16 LAB — HYPERSENSITIVITY PNEUMONITIS
A. Pullulans Abs: NEGATIVE
A.Fumigatus #1 Abs: NEGATIVE
Micropolyspora faeni, IgG: NEGATIVE
Pigeon Serum Abs: NEGATIVE
Thermoact. Saccharii: NEGATIVE
Thermoactinomyces vulgaris, IgG: NEGATIVE

## 2022-06-24 DIAGNOSIS — G4700 Insomnia, unspecified: Secondary | ICD-10-CM | POA: Diagnosis not present

## 2022-06-24 DIAGNOSIS — G479 Sleep disorder, unspecified: Secondary | ICD-10-CM | POA: Diagnosis not present

## 2022-06-24 DIAGNOSIS — G4733 Obstructive sleep apnea (adult) (pediatric): Secondary | ICD-10-CM | POA: Diagnosis not present

## 2022-06-26 ENCOUNTER — Other Ambulatory Visit: Payer: Self-pay | Admitting: Physician Assistant

## 2022-06-26 NOTE — Telephone Encounter (Signed)
Labs: 06/11/2022 CBC/CMP WNL  Next Visit: 08/25/2022  Last Visit: 05/15/2022  DX: Dermatomyositis   Current Dose per office note 05/15/2022: MTX 0.8 ml every 7 days   Okay to refill Methotrexate?

## 2022-06-30 ENCOUNTER — Ambulatory Visit (INDEPENDENT_AMBULATORY_CARE_PROVIDER_SITE_OTHER): Payer: Medicare Other | Admitting: Pulmonary Disease

## 2022-06-30 ENCOUNTER — Telehealth: Payer: Self-pay | Admitting: *Deleted

## 2022-06-30 DIAGNOSIS — J849 Interstitial pulmonary disease, unspecified: Secondary | ICD-10-CM | POA: Diagnosis not present

## 2022-06-30 LAB — PULMONARY FUNCTION TEST
DL/VA % pred: 112 %
DL/VA: 4.6 ml/min/mmHg/L
DLCO cor % pred: 97 %
DLCO cor: 19.54 ml/min/mmHg
DLCO unc % pred: 100 %
DLCO unc: 20.23 ml/min/mmHg
FEF 25-75 Pre: 2.18 L/sec
FEF2575-%Pred-Pre: 114 %
FEV1-%Pred-Pre: 88 %
FEV1-Pre: 2.05 L
FEV1FVC-%Pred-Pre: 109 %
FEV6-%Pred-Pre: 83 %
FEV6-Pre: 2.47 L
FEV6FVC-%Pred-Pre: 104 %
FVC-%Pred-Pre: 80 %
FVC-Pre: 2.48 L
Pre FEV1/FVC ratio: 83 %
Pre FEV6/FVC Ratio: 100 %
RV % pred: 91 %
RV: 2.07 L
TLC % pred: 87 %
TLC: 4.57 L

## 2022-06-30 NOTE — Telephone Encounter (Signed)
Submitted a Prior Authorization request to [facility] for Methotrexate  via CoverMyMeds. Will update once we receive a response.

## 2022-06-30 NOTE — Progress Notes (Signed)
Full PFT performed today except Post Marisa Orozco due to patient being Severely allergic to Albuterol. Patient states it cause her eyes and mucus membranes to itch and drain  very bad.

## 2022-06-30 NOTE — Patient Instructions (Signed)
Full PFT performed today except Post Marisa Orozco due to patient being Severely allergic to Albuterol. Patient states it cause her eyes and mucus membranes to itch and drain  very bad.

## 2022-07-02 ENCOUNTER — Other Ambulatory Visit: Payer: Self-pay | Admitting: Physician Assistant

## 2022-07-02 NOTE — Telephone Encounter (Signed)
Last Fill: 04/01/2022  Next Visit: 08/25/2022   Last Visit: 05/15/2022   DX: Dermatomyositis    Current Dose per office note 05/15/2022: tapering prednisone gradually and she is currently on prednisone 1.5 mg p.o. daily   Okay to refill Prednisone?

## 2022-07-02 NOTE — Telephone Encounter (Signed)
Received notification from [facility] regarding a prior authorization for Methotrexate. Authorization has been APPROVED from 05/31/2022 until further notice   Authorization # [id number] Phone # [phone]

## 2022-07-08 ENCOUNTER — Ambulatory Visit
Admission: RE | Admit: 2022-07-08 | Discharge: 2022-07-08 | Disposition: A | Discharge status: Home / Self Care | Payer: Medicare Other | Source: Ambulatory Visit | Attending: Pulmonary Disease | Admitting: Pulmonary Disease

## 2022-07-08 DIAGNOSIS — J9859 Other diseases of mediastinum, not elsewhere classified: Secondary | ICD-10-CM | POA: Diagnosis not present

## 2022-07-08 DIAGNOSIS — K449 Diaphragmatic hernia without obstruction or gangrene: Secondary | ICD-10-CM | POA: Diagnosis not present

## 2022-07-08 DIAGNOSIS — J479 Bronchiectasis, uncomplicated: Secondary | ICD-10-CM | POA: Diagnosis not present

## 2022-07-08 DIAGNOSIS — J8410 Pulmonary fibrosis, unspecified: Secondary | ICD-10-CM | POA: Diagnosis not present

## 2022-07-08 DIAGNOSIS — J849 Interstitial pulmonary disease, unspecified: Secondary | ICD-10-CM

## 2022-07-13 ENCOUNTER — Encounter: Payer: Self-pay | Admitting: Pulmonary Disease

## 2022-07-13 ENCOUNTER — Ambulatory Visit (INDEPENDENT_AMBULATORY_CARE_PROVIDER_SITE_OTHER): Payer: Medicare Other | Admitting: Pulmonary Disease

## 2022-07-13 VITALS — BP 142/78 | HR 85 | Temp 97.9°F | Ht 66.0 in | Wt 152.2 lb

## 2022-07-13 DIAGNOSIS — J849 Interstitial pulmonary disease, unspecified: Secondary | ICD-10-CM | POA: Diagnosis not present

## 2022-07-13 DIAGNOSIS — Z5181 Encounter for therapeutic drug level monitoring: Secondary | ICD-10-CM

## 2022-07-13 MED ORDER — MYCOPHENOLATE MOFETIL 500 MG PO TABS: 500.0000 mg | ORAL_TABLET | Freq: Two times a day (BID) | ORAL | 5 refills | Status: DC

## 2022-07-13 NOTE — Progress Notes (Signed)
Marisa Orozco    [id number]    [birth year]  Primary Care Physician:Daniel, Lucita Lora, MD  Referring Physician: Richardean Chimera, MD [address] [place],  [place] [address]  Chief complaint: Consult for interstitial lung disease  HPI: 72 y.o. who  has a past medical history of Allergy, Anxiety, Cancer, Dermatomyositis (02/04/2016), Former smoker (02/04/2016), GERD (gastroesophageal reflux disease), PONV (postoperative nausea and vomiting), Raynaud's disease, Sjogren's disease (02/04/2016), and Sjogren's syndrome.   She has longstanding history of dermatomyositis, Sjogren's syndrome.  Diagnosed initially as a teenager.  She was treated with glucocorticoids at that time.  She had a flareup in 2017 and a repeat muscle biopsy.  She is currently under the care of Dr. Corliss Skains and being treated with methotrexate, Plaquenil and prednisone with good control of symptoms.  Dr. Corliss Skains noted some lung crackles on examination and sent her for evaluation of interstitial lung disease.  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  She was evaluated by Dr. 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  She has been followed radiographically.  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  No mold, hot tub, Jacuzzi Smoking history: 25-pack-year smoker.  Quit in 2005 Travel history: Previously lived in [place].  No significant recent travel Relevant family history: Mother has COPD.  She was a smoker.  Interim history: Here for review of CT and PFTs.  States that breathing is stable.  She is nervous about the diagnosis of interstitial lung disease.  Outpatient Encounter Medications as of  07/13/2022  Medication Sig   Acetaminophen (TYLENOL PO) Take by mouth as needed.   alendronate (FOSAMAX) 70 MG tablet Take 70 mg by mouth once a week.   amLODipine (NORVASC) 2.5 MG tablet Take 1 tablet (2.5 mg total) by mouth daily.   Cholecalciferol (VITAMIN D3) 1000 UNIT/SPRAY LIQD Take 5 sprays by mouth daily.    folic acid (FOLVITE) 1 MG tablet TAKE 2 TABLETS BY MOUTH EVERY DAY   hydroxychloroquine (PLAQUENIL) 200 MG tablet TAKE ONE TABLET BY MOUTH TWICE DAILY MONDAY THROUGH FRIDAY   Magnesium Glycinate POWD 400 mg by Does not apply route 2 (two) times daily.   methotrexate 50 MG/2ML injection INJECT 0.8ML INTO THE SKIN ONCE A WEEK   Multiple Vitamin (MULTIVITAMIN PO) Take by mouth daily.   OVER THE COUNTER MEDICATION NUTRAFOL   predniSONE (DELTASONE) 1 MG tablet Take 1.5 tablets (1.5 mg total) by mouth daily with breakfast.   sertraline (ZOLOFT) 50 MG tablet Take 75 mg by mouth daily. Taking 75mg  daily   TUBERCULIN SYR 1CC/27GX1/2" 27G X 1/2" 1 ML MISC Patient to use weekly to inject Methotrexate   vitamin C (ASCORBIC ACID) 250 MG tablet Take 250 mg by mouth daily.   VITAMIN K PO Take 1 tablet by mouth daily.    No facility-administered encounter medications on file as of 07/13/2022.   Physical Exam: Blood pressure (!) 142/78, pulse 85, temperature 97.9 F (36.6 C), temperature source Oral, height 5\' 6"  (1.676 m), weight 152 lb 3.2 oz (69 kg), SpO2 99 %. Gen:      No acute distress HEENT:  EOMI, sclera anicteric Neck:  No masses; no thyromegaly Lungs:    Faint bibasilar crackles CV:         Regular rate and rhythm; no murmurs Abd:      + bowel sounds; soft, non-tender; no palpable masses, no distension Ext:    No edema; adequate peripheral perfusion Skin:      Warm and dry; no rash Neuro: alert and oriented x 3 Psych: normal mood and affect  Data Reviewed: Imaging: CT abdomen pelvis 01/30/2016- minimal basal reticulation. CT chest 09/30/2018-stable anterior mediastinal soft  tissue density, mild basilar fibrotic changes CT abdomen pelvis 12/31/2020-increase in size of basal mild fibrotic changes. High resolution CT 07/08/2022-basilar predominant interstitial lung disease and probable UIP pattern.  Progression since 2020, stable anterior mediastinal soft tissue mass I have reviewed the images personally.  PFTs: 06/30/2022 FVC 2.48 [80%], FEV1 2.05 [88%], F/F33, TLC 4.57 [87%], DLCO 20.23 [100%] Normal test  Labs: CBC, CMP 06/11/2022-normal limits Hypersensitive panel 06/11/2022-negative  Assessment:  Evaluation for interstitial lung disease She has history of dermatomyositis and Sjogren's syndrome under good control with a regimen of methotrexate, Plaquenil and prednisone.  There is down pillows in her living room but exposure appears to be minimal.  She does have minimal crackles on examination.  Review of her lung imaging shows basal mild changes of reticulation back in 2017 which appears to have increased on the latest high resolution CT  Appearance does not appear to be consistent with hypersensitivity pneumonitis and serologies are negative I advised her to get rid of the down pillows in the living room to be on the safe side  Reviewed findings in detail with patient.  I advised her to stop methotrexate and start CellCept for better treatment of interstitial lung disease.  She may need addition of antifibrotic therapy in the future.  Recent labs show normal CBC and CMP.  Plan/Recommendations: - Get rid of down pillows - Start CellCept 500 mg twice daily.  Follow-up in 1 month for up titration of dose. - Stop methotrexate and folic acid  Chilton GreathousePraveen Kinya Meine MD [facility] 07/13/2022, 9:19 AM  CC: Richardean Chimeraaniel, Terry G, MD

## 2022-07-13 NOTE — Patient Instructions (Signed)
Will start you on a medication called CellCept 500 mg twice daily.  You can stop taking the methotrexate and folic acid for now.  I will discuss with Dr. Corliss Skains regarding this. Will order high-res CT and PFTs in 6 months.  Return to clinic in 1 month.

## 2022-07-16 ENCOUNTER — Encounter: Payer: Self-pay | Admitting: Rheumatology

## 2022-07-16 NOTE — Telephone Encounter (Signed)
I received a phone call from the patient.  She was concerned that she was having a flare of dermatomyositis and she is coming off methotrexate.  She feels that CellCept is causing increased muscle pain.  I tried to convince her that CellCept should not cause flare of dermatomyositis.  I also explained to her that CellCept is the treatment for dermatomyositis.  I advised her to try CellCept  1 tablet daily for couple of days and once she tolerates the medication then she can go to twice a day.  Patient was in agreement.  I also advised her that she can get a CK if she starts experiencing increased muscle pain.

## 2022-08-02 ENCOUNTER — Other Ambulatory Visit: Payer: Self-pay | Admitting: Internal Medicine

## 2022-08-04 ENCOUNTER — Other Ambulatory Visit: Payer: Self-pay | Admitting: Rheumatology

## 2022-08-04 DIAGNOSIS — M810 Age-related osteoporosis without current pathological fracture: Secondary | ICD-10-CM | POA: Diagnosis not present

## 2022-08-04 DIAGNOSIS — E782 Mixed hyperlipidemia: Secondary | ICD-10-CM | POA: Diagnosis not present

## 2022-08-04 DIAGNOSIS — E119 Type 2 diabetes mellitus without complications: Secondary | ICD-10-CM | POA: Diagnosis not present

## 2022-08-04 NOTE — Telephone Encounter (Signed)
Last Fill: 07/02/2022  Next Visit: 08/25/2022  Last Visit: 05/15/2022  DX: Dermatomyositis   Current Dose per office note on 05/15/2022: prednisone 1.5 mg p.o. daily.   I called patient and patient states that she is currently taking 2mg  of prednisone daily.   Okay to refill prednisone? And at which dose?

## 2022-08-07 DIAGNOSIS — N760 Acute vaginitis: Secondary | ICD-10-CM | POA: Diagnosis not present

## 2022-08-07 DIAGNOSIS — N898 Other specified noninflammatory disorders of vagina: Secondary | ICD-10-CM | POA: Diagnosis not present

## 2022-08-07 DIAGNOSIS — N816 Rectocele: Secondary | ICD-10-CM | POA: Diagnosis not present

## 2022-08-11 NOTE — Progress Notes (Addendum)
Office Visit Note  Patient: Marisa Orozco             Date of Birth: 1950-08-05           MRN: [id number]             PCP: Richardean Chimera, MD Referring: Richardean Chimera, MD Visit Date: 08/25/2022 Occupation: @GUAROCC @  Subjective:  Medication management  History of Present Illness: Marisa Orozco is a 72 y.o. female with history of dermatomyositis presents and interstitial lung disease.  She returns today after her last visit in February 2024.  She was evaluated by Dr. Isaiah Serge.  High-resolution CT on July 09, 2022 was consistent with ILD with predominant basilar fibrotic interstitial lung disease.  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  Patient was started on CellCept by Dr. Isaiah Serge on July 13, 2022.  The dose of CellCept was gradually increased.  She is currently on CellCept 500 mg, 2 tablets p.o. twice daily.  She has been tolerating medication without any side effects.  She is still on hydroxychloroquine 200 mg p.o. twice daily Monday to Friday.  Patient wants to stop hydroxychloroquine.  She she has not had a rash from dermatomyositis in a long time.  She denies any muscular weakness or tenderness.  She denies any shortness of breath.    Activities of Daily Living:  Patient reports morning stiffness for 0 minutes.   Patient Denies nocturnal pain.  Difficulty dressing/grooming: Denies Difficulty climbing stairs: Reports Difficulty getting out of chair: Reports Difficulty using hands for taps, buttons, cutlery, and/or writing: Denies  Review of Systems  Constitutional:  Positive for fatigue.  HENT:  Positive for mouth dryness. Negative for mouth sores.   Eyes:  Negative for dryness.  Respiratory:  Negative for shortness of breath.   Cardiovascular:  Negative for chest pain and palpitations.  Gastrointestinal:  Negative for blood in stool, constipation and diarrhea.  Endocrine: Negative for increased urination.  Genitourinary:  Negative for involuntary urination.   Musculoskeletal:  Positive for joint pain and joint pain. Negative for gait problem, joint swelling, myalgias, muscle weakness, morning stiffness, muscle tenderness and myalgias.  Skin:  Positive for sensitivity to sunlight. Negative for color change, rash and hair loss.  Allergic/Immunologic: Negative for susceptible to infections.  Neurological:  Negative for dizziness and headaches.  Hematological:  Negative for swollen glands.  Psychiatric/Behavioral:  Positive for sleep disturbance. Negative for depressed mood. The patient is nervous/anxious.     PMFS History:  Patient Active Problem List   Diagnosis Date Noted   Palpitations 01/15/2021   Hypothyroidism 07/26/2019   Unilateral primary osteoarthritis, right knee 02/03/2018   Primary osteoarthritis of both hands 09/08/2017   Sjogren's disease (HCC) 02/04/2016   Dermatomyositis (HCC) 02/04/2016   Former smoker 02/04/2016   Thymoma 02/04/2016   Fatigue 02/15/2012    Past Medical History:  Diagnosis Date   Allergy    Anxiety    Cancer (HCC)    basal cell carcinoma - face   Dermatomyositis (HCC) 02/04/2016   Jo 1 Positive  Skin Bx- Subtle interface Dermatitis    Former smoker 02/04/2016   GERD (gastroesophageal reflux disease)    otc med prn   PONV (postoperative nausea and vomiting)    Raynaud's disease    Sjogren's disease (HCC) 02/04/2016   Positive ANA, Positive Ro, Positive CCP, Negative RF, Parotitis, Positive Sicca   Sjogren's syndrome (HCC)    from MD note    Family History  Problem Relation Age  of Onset   COPD Mother    Heart disease Mother    Diabetes Father    Kidney disease Father    Epilepsy Daughter    Rectal cancer Maternal Uncle    Esophageal cancer Neg Hx    Stomach cancer Neg Hx    Ulcerative colitis Neg Hx    Past Surgical History:  Procedure Laterality Date   BREAST SURGERY     augmentation   BUNIONECTOMY     right foot   COLONOSCOPY     DIAGNOSTIC LAPAROSCOPY     DILATION AND CURETTAGE  OF UTERUS  04/15/2011   Procedure: DILATATION AND CURETTAGE;  Surgeon: Levi Aland;  Location: [facility];  Service: Gynecology;  Laterality: N/A;   IR RADIOLOGIST EVAL & MGMT  02/04/2021   MUSCLE BIOPSY Left 01/02/2016   Procedure: MUSCLE BIOPSY LEFT DELTOID;  Surgeon: Avel Peace, MD;  Location: [facility];  Service: General;  Laterality: Left;  left deltoid muscle bx   NASAL SEPTUM SURGERY     SVD     x 2   TONSILLECTOMY     WISDOM TOOTH EXTRACTION     Social History   Social History Narrative   Not on file   Immunization History  Administered Date(s) Administered   Influenza, High Dose Seasonal PF 01/07/2018   Influenza-Unspecified 01/04/2014   PFIZER(Purple Top)SARS-COV-2 Vaccination 04/26/2019, 05/17/2019, 12/12/2019   Pneumococcal Conjugate-13 01/05/2016   Zoster Recombinat (Shingrix) 08/11/2017, 12/17/2017     Objective: Vital Signs: BP 133/80 (BP Location: Left Arm, Patient Position: Sitting, Cuff Size: Normal)   Pulse 76   Resp 15   Ht 5\' 6"  (1.676 m)   Wt 151 lb 12.8 oz (68.9 kg)   BMI 24.50 kg/m    Physical Exam Vitals and nursing note reviewed.  Constitutional:      Appearance: She is well-developed.  HENT:     Head: Normocephalic and atraumatic.  Eyes:     Conjunctiva/sclera: Conjunctivae normal.  Cardiovascular:     Rate and Rhythm: Normal rate and regular rhythm.     Heart sounds: Normal heart sounds.  Pulmonary:     Effort: Pulmonary effort is normal.     Breath sounds: Normal breath sounds.     Comments: Crackles audible in bilateral lung bases. Abdominal:     General: Bowel sounds are normal.     Palpations: Abdomen is soft.  Musculoskeletal:     Cervical back: Normal range of motion.  Lymphadenopathy:     Cervical: No cervical adenopathy.  Skin:    General: Skin is warm and dry.     Capillary Refill: Capillary refill takes less than 2 seconds.  Neurological:     Mental Status: She is alert and oriented to person, place,  and time.  Psychiatric:        Behavior: Behavior normal.      Musculoskeletal Exam: Cervical, thoracic and lumbar spine were in good range of motion.  Shoulder joints, elbow joints, wrist joints, MCPs PIPs and DIPs were in good range of motion with no synovitis.  Hip joints, knee joints, ankles, MTPs and PIPs were in good range of motion with no synovitis.  CDAI Exam: CDAI Score: -- Patient Global: --; Provider Global: -- Swollen: --; Tender: -- Joint Exam 08/25/2022   No joint exam has been documented for this visit   There is currently no information documented on the homunculus. Go to the Rheumatology activity and complete the homunculus joint exam.  Investigation: No  additional findings.  Imaging: No results found.  Recent Labs: Lab Results  Component Value Date   WBC 9.7 08/14/2022   HGB 14.3 08/14/2022   PLT 174.0 08/14/2022   NA 139 08/14/2022   K 4.5 08/14/2022   CL 105 08/14/2022   CO2 30 08/14/2022   GLUCOSE 97 08/14/2022   BUN 22 08/14/2022   CREATININE 0.79 08/14/2022   BILITOT 0.5 08/14/2022   ALKPHOS 52 08/14/2022   AST 16 08/14/2022   ALT 9 08/14/2022   PROT 6.7 08/14/2022   ALBUMIN 3.9 08/14/2022   CALCIUM 9.1 08/14/2022   GFRAA 87 06/25/2020   QFTBGOLDPLUS NEGATIVE 07/07/2017    Speciality Comments: PLQ Eye Exam: 02/23/2022 WNL @ [facility]. Follow up 1 year.   Fosamax May 08, 2021  Procedures:  No procedures performed Allergies: Codeine, Flagyl [metronidazole hcl], Nexium [esomeprazole magnesium], Imuran [azathioprine], Sulfa antibiotics, and Sulfa drugs cross reactors   Assessment / Plan:     Visit Diagnoses: Dermatomyositis (HCC) - Jo 1+, Positive muscle biopsy for dermatomyositis: Patient has done well on many years on methotrexate 0.8 mL subcutaneous who weekly and hydroxychloroquine 200 mg p.o. twice daily Monday to Friday.  She has had no recurrence of rash, muscular weakness or tenderness.  She was found to have  crackles in her lungs at the last visit.  She was referred to Dr. Isaiah Serge.  She had high-resolution CT which showed probable UIP pattern.  She was switched to CellCept by Dr. Isaiah Serge on July 13, 2022.  Methotrexate was discontinued.  CellCept dose was gradually increased.  Patient has been tolerating CellCept without any side effects.  She denies any increased muscular weakness or tenderness.  She had no difficulty getting up from the chair.  She would like to come off hydroxychloroquine.  I discussed the benefits of Plaquenil and prevention of recurrence of rash.  Patient would like to decrease the dose of hydroxychloroquine to 1 tablet p.o. daily.  I was in agreement.  If she develops a rash then she can resume the full dose of Plaquenil.  Use of sunscreen and protective clothing was discussed at length.  Age-appropriate screening for malignancy was also advised.  Patient has a colonoscopy coming up.  Annual mammogram and Pap smear were also advised.  High risk medication use - Cellcept 500 mg, 2 tablets p.o. twice daily, Plaquenil 200 mg BID M-F,prednisone 1 to 2 mg p.o. daily.  I discussed tapering of hydroxychloroquine with the patient.  PLQ Eye Exam: 02/23/2022.  Labs obtained on Aug 14, 2022 CBC and CMP were normal.  CK was 51.  She was advised to get labs every 3 months while she is on CellCept along with CK.  Sjogren's syndrome with keratoconjunctivitis sicca (HCC)-she continues to have dry mouth and dry eye symptoms.  Over-the-counter products were discussed.  ILD (interstitial lung disease) (HCC) -high-resolution CT on July 08, 2022 was suspicious of UIP pattern.  She has been under care of Dr. Isaiah Serge and has been on CellCept since April 2024.  The dose has been gradually increased.  Long term (current) use of systemic steroids -she has been tapering prednisone gradually.  She is currently taking 1.5 or 2 mg prednisone daily.  Primary osteoarthritis of both hands-she had bilateral PIP and DIP  thickening consistent with osteoarthritis.  Joint protection was discussed.  Chronic pain of right knee-improved.  Primary osteoarthritis of both knees-she had intentional weight loss of over 20 pounds.  She has noticed improvement in her knee  joint pain symptoms since the weight loss.  Raynaud's disease without gangrene-currently not symptomatic.  Anxiety-she is on Zoloft.  Age-related osteoporosis without current pathological fracture - Patient states that she had a repeat DEXA with a T score of -2.2.  Fosamax since May 08, 2021.  Advised her to get repeat DEXA scan in 2025.  Patient states she had DEXA scan in 2022 and will have repeat DEXA scan this year.  History of vitamin D deficiency-she is on vitamin D supplement.  Thymoma  Former smoker  Orders: No orders of the defined types were placed in this encounter.  No orders of the defined types were placed in this encounter.    Follow-Up Instructions: Return in about 3 months (around 11/25/2022) for Dermatomyositis, ILD, Sjogren's.   Pollyann Savoy, MD  Note - This record has been created using Animal nutritionist.  Chart creation errors have been sought, but may not always  have been located. Such creation errors do not reflect on  the standard of medical care.

## 2022-08-14 ENCOUNTER — Encounter: Payer: Self-pay | Admitting: Pulmonary Disease

## 2022-08-14 ENCOUNTER — Ambulatory Visit (INDEPENDENT_AMBULATORY_CARE_PROVIDER_SITE_OTHER): Payer: Medicare Other | Admitting: Pulmonary Disease

## 2022-08-14 VITALS — BP 136/84 | HR 78 | Ht 66.0 in | Wt 151.4 lb

## 2022-08-14 DIAGNOSIS — J849 Interstitial pulmonary disease, unspecified: Secondary | ICD-10-CM | POA: Diagnosis not present

## 2022-08-14 DIAGNOSIS — Z5181 Encounter for therapeutic drug level monitoring: Secondary | ICD-10-CM

## 2022-08-14 LAB — CBC WITH DIFFERENTIAL/PLATELET
Basophils Absolute: 0.1 10*3/uL (ref 0.0–0.1)
Basophils Relative: 0.8 % (ref 0.0–3.0)
Eosinophils Absolute: 0.5 10*3/uL (ref 0.0–0.7)
Eosinophils Relative: 5 % (ref 0.0–5.0)
HCT: 43.8 % (ref 36.0–46.0)
Hemoglobin: 14.3 g/dL (ref 12.0–15.0)
Lymphocytes Relative: 17 % (ref 12.0–46.0)
Lymphs Abs: 1.6 10*3/uL (ref 0.7–4.0)
MCHC: 32.8 g/dL (ref 30.0–36.0)
MCV: 93 fl (ref 78.0–100.0)
Monocytes Absolute: 1.1 10*3/uL — ABNORMAL HIGH (ref 0.1–1.0)
Monocytes Relative: 11.2 % (ref 3.0–12.0)
Neutro Abs: 6.4 10*3/uL (ref 1.4–7.7)
Neutrophils Relative %: 66 % (ref 43.0–77.0)
Platelets: 174 10*3/uL (ref 150.0–400.0)
RBC: 4.71 Mil/uL (ref 3.87–5.11)
RDW: 13.8 % (ref 11.5–15.5)
WBC: 9.7 10*3/uL (ref 4.0–10.5)

## 2022-08-14 LAB — COMPREHENSIVE METABOLIC PANEL
ALT: 9 U/L (ref 0–35)
AST: 16 U/L (ref 0–37)
Albumin: 3.9 g/dL (ref 3.5–5.2)
Alkaline Phosphatase: 52 U/L (ref 39–117)
BUN: 22 mg/dL (ref 6–23)
CO2: 30 mEq/L (ref 19–32)
Calcium: 9.1 mg/dL (ref 8.4–10.5)
Chloride: 105 mEq/L (ref 96–112)
Creatinine, Ser: 0.79 mg/dL (ref 0.40–1.20)
GFR: 75.12 mL/min (ref >60.00)
Glucose, Bld: 97 mg/dL (ref 70–99)
Potassium: 4.5 mEq/L (ref 3.5–5.1)
Sodium: 139 mEq/L (ref 135–145)
Total Bilirubin: 0.5 mg/dL (ref 0.2–1.2)
Total Protein: 6.7 g/dL (ref 6.0–8.3)

## 2022-08-14 LAB — CK: Total CK: 51 U/L (ref 7–177)

## 2022-08-14 MED ORDER — MYCOPHENOLATE MOFETIL 500 MG PO TABS: 1000.0000 mg | ORAL_TABLET | Freq: Two times a day (BID) | ORAL | 5 refills | Status: DC

## 2022-08-14 NOTE — Addendum Note (Signed)
Addended by: Bradd Canary L on: 08/14/2022 09:08 AM   Modules accepted: Orders

## 2022-08-14 NOTE — Addendum Note (Signed)
Addended by: Bradd Canary L on: 08/14/2022 04:20 PM   Modules accepted: Orders

## 2022-08-14 NOTE — Progress Notes (Signed)
Marisa Orozco    [id number]    January 19, 1951  Primary Care Physician:Daniel, Lucita Lora, MD  Referring Physician: Richardean Chimera, MD [address] [place],  [place] [address]  Chief complaint: Consult for interstitial lung disease  HPI: 72 y.o. 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   She has longstanding history of dermatomyositis, Sjogren's syndrome.  Diagnosed initially as a teenager.  She was treated with glucocorticoids at that time.  She had a flareup in 2017 and a repeat muscle biopsy.  She is currently under the care of Dr. Corliss Skains and being treated with methotrexate, Plaquenil and prednisone with good control of symptoms.  Dr. Corliss Skains noted some lung crackles on examination and sent her for evaluation of interstitial lung disease.  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  She was evaluated by Dr. 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  She has been followed radiographically.  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  No mold, hot tub, Jacuzzi Smoking history: 25-pack-year smoker.  Quit in 2005 Travel history: Previously lived in [place].  No significant recent travel Relevant family history: Mother has COPD.  She was a smoker.  Interim history: Taken off methotrexate and started on CellCept 500 mg twice daily She is tolerating the medication well and in fact states that her joint pain is  better  Outpatient Encounter Medications as of 08/14/2022  Medication Sig   Acetaminophen (TYLENOL PO) Take by mouth as needed.   alendronate (FOSAMAX) 70 MG tablet Take 70 mg by mouth once a week.   Cholecalciferol (VITAMIN D3) 1000 UNIT/SPRAY LIQD Take 5 sprays by mouth daily.    hydroxychloroquine (PLAQUENIL) 200 MG tablet TAKE ONE TABLET BY MOUTH TWICE DAILY MONDAY THROUGH FRIDAY   Magnesium Glycinate POWD 400 mg by Does not apply route 2 (two) times daily.   Multiple Vitamin (MULTIVITAMIN PO) Take by mouth daily.   mycophenolate (CELLCEPT) 500 MG tablet Take 1 tablet (500 mg total) by mouth 2 (two) times daily.   OVER THE COUNTER MEDICATION NUTRAFOL   predniSONE (DELTASONE) 1 MG tablet Take 2 tablets (2 mg total) by mouth daily with breakfast.   sertraline (ZOLOFT) 50 MG tablet Take 75 mg by mouth daily. Taking 75mg  daily   vitamin C (ASCORBIC ACID) 250 MG tablet Take 250 mg by mouth daily.   VITAMIN K PO Take 1 tablet by mouth daily.    amLODipine (NORVASC) 2.5 MG tablet Take 1 tablet (2.5 mg total) by mouth daily. (Patient not taking: Reported on 08/14/2022)   folic acid (FOLVITE) 1 MG tablet TAKE 2 TABLETS BY MOUTH EVERY DAY (Patient not taking: Reported on 08/14/2022)   methotrexate 50 MG/2ML injection INJECT 0.8ML INTO THE SKIN ONCE A WEEK (Patient not taking: Reported on 08/14/2022)   TUBERCULIN SYR 1CC/27GX1/2" 27G X 1/2" 1 ML MISC Patient to use weekly to inject Methotrexate (Patient not taking: Reported on 08/14/2022)   No facility-administered encounter medications on file as of 08/14/2022.  Physical Exam: Blood pressure 136/84, pulse 78, height 5\' 6"  (1.676 m), weight 151 lb 6.4 oz (68.7 kg), SpO2 97 %. Gen:      No acute distress HEENT:  EOMI, sclera anicteric Neck:     No masses; no thyromegaly Lungs:   Bibasal crackles CV:         Regular rate and rhythm; no murmurs Abd:      + bowel sounds; soft, non-tender; no palpable masses, no distension Ext:    No edema; adequate  peripheral perfusion Skin:      Warm and dry; no rash Neuro: alert and oriented x 3 Psych: normal mood and affect   Data Reviewed: Imaging: CT abdomen pelvis 01/30/2016- minimal basal reticulation. CT chest 09/30/2018-stable anterior mediastinal soft tissue density, mild basilar fibrotic changes CT abdomen pelvis 12/31/2020-increase in size of basal mild fibrotic changes. High resolution CT 07/08/2022-basilar predominant interstitial lung disease and probable UIP pattern.  Progression since 2020, stable anterior mediastinal soft tissue mass I have reviewed the images personally.  PFTs: 06/30/2022 FVC 2.48 [80%], FEV1 2.05 [88%], F/F33, TLC 4.57 [87%], DLCO 20.23 [100%] Normal test  Labs: CBC, CMP 06/11/2022-normal limits Hypersensitive panel 06/11/2022-negative  Assessment:  Evaluation for interstitial lung disease She has history of dermatomyositis and Sjogren's syndrome under good control with a regimen of methotrexate, Plaquenil and prednisone.  There is down pillows in her living room but exposure appears to be minimal.  She does have minimal crackles on examination.  Review of her lung imaging shows basal mild changes of reticulation back in 2017 which appears to have increased on the latest high resolution CT  Appearance does not appear to be consistent with hypersensitivity pneumonitis and serologies are negative I advised her to get rid of the down pillows in the living room to be on the safe side  Reviewed findings in detail with patient.  We have stopped methotrexate and started CellCept for better treatment of interstitial lung disease.  So far she is tolerating it well Check hepatic panel, CBC and CK today Increase CellCept to 1 g twice daily She may need addition of antifibrotic therapy in the future if there is progression of pulmonary fibrosis and spite of good dose of CellCept.  Plan/Recommendations: - Get rid of down pillows - Increase CellCept to 1 g twice daily -  Check labs for monitoring  Chilton Greathouse MD [facility] 08/14/2022, 8:49 AM  CC: Richardean Chimera, MD

## 2022-08-14 NOTE — Patient Instructions (Addendum)
I am glad you are doing well with the medication Will increase CellCept dose to 1 g twice daily Will check some labs today including comprehensive metabolic panel, CBC and CK Follow-up in 2 months.

## 2022-08-25 ENCOUNTER — Ambulatory Visit: Payer: Medicare Other | Attending: Rheumatology | Admitting: Rheumatology

## 2022-08-25 ENCOUNTER — Encounter: Payer: Self-pay | Admitting: Rheumatology

## 2022-08-25 VITALS — BP 133/80 | HR 76 | Resp 15 | Ht 66.0 in | Wt 151.8 lb

## 2022-08-25 DIAGNOSIS — M25561 Pain in right knee: Secondary | ICD-10-CM | POA: Insufficient documentation

## 2022-08-25 DIAGNOSIS — M19042 Primary osteoarthritis, left hand: Secondary | ICD-10-CM | POA: Diagnosis not present

## 2022-08-25 DIAGNOSIS — M810 Age-related osteoporosis without current pathological fracture: Secondary | ICD-10-CM | POA: Diagnosis not present

## 2022-08-25 DIAGNOSIS — F419 Anxiety disorder, unspecified: Secondary | ICD-10-CM | POA: Diagnosis not present

## 2022-08-25 DIAGNOSIS — I7300 Raynaud's syndrome without gangrene: Secondary | ICD-10-CM | POA: Diagnosis not present

## 2022-08-25 DIAGNOSIS — Z7952 Long term (current) use of systemic steroids: Secondary | ICD-10-CM | POA: Diagnosis not present

## 2022-08-25 DIAGNOSIS — Z87891 Personal history of nicotine dependence: Secondary | ICD-10-CM

## 2022-08-25 DIAGNOSIS — M170 Bilateral primary osteoarthritis of knee: Secondary | ICD-10-CM | POA: Diagnosis not present

## 2022-08-25 DIAGNOSIS — M3501 Sicca syndrome with keratoconjunctivitis: Secondary | ICD-10-CM

## 2022-08-25 DIAGNOSIS — D4989 Neoplasm of unspecified behavior of other specified sites: Secondary | ICD-10-CM | POA: Diagnosis not present

## 2022-08-25 DIAGNOSIS — Z79899 Other long term (current) drug therapy: Secondary | ICD-10-CM

## 2022-08-25 DIAGNOSIS — G8929 Other chronic pain: Secondary | ICD-10-CM | POA: Diagnosis not present

## 2022-08-25 DIAGNOSIS — Z8639 Personal history of other endocrine, nutritional and metabolic disease: Secondary | ICD-10-CM

## 2022-08-25 DIAGNOSIS — R0989 Other specified symptoms and signs involving the circulatory and respiratory systems: Secondary | ICD-10-CM

## 2022-08-25 DIAGNOSIS — J849 Interstitial pulmonary disease, unspecified: Secondary | ICD-10-CM

## 2022-08-25 DIAGNOSIS — M3313 Other dermatomyositis without myopathy: Secondary | ICD-10-CM | POA: Diagnosis not present

## 2022-08-25 DIAGNOSIS — M19041 Primary osteoarthritis, right hand: Secondary | ICD-10-CM | POA: Diagnosis not present

## 2022-08-25 NOTE — Patient Instructions (Addendum)
Standing Labs We placed an order today for your standing lab work.   Please have your standing labs drawn in August and every 3 months   Please have your labs drawn 2 weeks prior to your appointment so that the provider can discuss your lab results at your appointment, if possible.  Please note that you may see your imaging and lab results in MyChart before we have reviewed them. We will contact you once all results are reviewed. Please allow our office up to 72 hours to thoroughly review all of the results before contacting the office for clarification of your results.  WALK-IN LAB HOURS  Monday through Thursday from 8:00 am -12:30 pm and 1:00 pm-5:00 pm and Friday from 8:00 am-12:00 pm.  Patients with office visits requiring labs will be seen before walk-in labs.  You may encounter longer than normal wait times. Please allow additional time. Wait times may be shorter on  Monday and Thursday afternoons.  We do not book appointments for walk-in labs. We appreciate your patience and understanding with our staff.   Labs are drawn by [facility]. Please bring your co-pay at the time of your lab draw.  You may receive a bill from [facility] for your lab work.  Please note if you are on Hydroxychloroquine and and an order has been placed for a Hydroxychloroquine level,  you will need to have it drawn 4 hours or more after your last dose.  If you wish to have your labs drawn at another location, please call the office 24 hours in advance so we can fax the orders.  The office is located at [address], [address], [place], [place] [address]   If you have any questions regarding directions or hours of operation,  please call [phone].   As a reminder, please drink plenty of water prior to coming for your lab work. Thanks!  

## 2022-09-07 DIAGNOSIS — H9312 Tinnitus, left ear: Secondary | ICD-10-CM | POA: Diagnosis not present

## 2022-09-07 DIAGNOSIS — G43109 Migraine with aura, not intractable, without status migrainosus: Secondary | ICD-10-CM | POA: Diagnosis not present

## 2022-09-07 DIAGNOSIS — G479 Sleep disorder, unspecified: Secondary | ICD-10-CM | POA: Diagnosis not present

## 2022-09-07 DIAGNOSIS — G44209 Tension-type headache, unspecified, not intractable: Secondary | ICD-10-CM | POA: Diagnosis not present

## 2022-09-18 DIAGNOSIS — I10 Essential (primary) hypertension: Secondary | ICD-10-CM | POA: Diagnosis not present

## 2022-09-18 DIAGNOSIS — K7581 Nonalcoholic steatohepatitis (NASH): Secondary | ICD-10-CM | POA: Diagnosis not present

## 2022-09-18 DIAGNOSIS — E039 Hypothyroidism, unspecified: Secondary | ICD-10-CM | POA: Diagnosis not present

## 2022-09-18 DIAGNOSIS — R7301 Impaired fasting glucose: Secondary | ICD-10-CM | POA: Diagnosis not present

## 2022-09-18 DIAGNOSIS — E7849 Other hyperlipidemia: Secondary | ICD-10-CM | POA: Diagnosis not present

## 2022-09-21 ENCOUNTER — Encounter: Payer: Self-pay | Admitting: Pulmonary Disease

## 2022-09-22 NOTE — Telephone Encounter (Signed)
Message routed to Dr. Isaiah Serge-   I could only attach five files to the previous message.  Here is the CBC for 04-17-22  Attachments  [id number]

## 2022-09-22 NOTE — Telephone Encounter (Signed)
Message routed to Dr. Mancel Parsons Dr. Isaiah Serge,   I am attaching a lipid panel, CBC and CMET done on   04-17-22.  I am attaching a lipid panel, CBC and CMET from 09-18-22.  I am wondering if Cellcept has caused  a change in my triglycerides, HDL and changes in the results of my CMET and CBC.   Thank you. [name] Hohmann   Attachments  lipid_[email or web address]   Lipid_Panel 09-18-22.pdf   CMP_01-12-24.pdf   CMET_06-14-24.pdf   CBC_06-14-24.pdf

## 2022-09-25 DIAGNOSIS — F410 Panic disorder [episodic paroxysmal anxiety] without agoraphobia: Secondary | ICD-10-CM | POA: Diagnosis not present

## 2022-09-25 DIAGNOSIS — K7581 Nonalcoholic steatohepatitis (NASH): Secondary | ICD-10-CM | POA: Diagnosis not present

## 2022-09-25 DIAGNOSIS — M3390 Dermatopolymyositis, unspecified, organ involvement unspecified: Secondary | ICD-10-CM | POA: Diagnosis not present

## 2022-09-25 DIAGNOSIS — R030 Elevated blood-pressure reading, without diagnosis of hypertension: Secondary | ICD-10-CM | POA: Diagnosis not present

## 2022-09-25 DIAGNOSIS — E7849 Other hyperlipidemia: Secondary | ICD-10-CM | POA: Diagnosis not present

## 2022-09-25 DIAGNOSIS — I700 Atherosclerosis of aorta: Secondary | ICD-10-CM | POA: Diagnosis not present

## 2022-09-25 DIAGNOSIS — R7301 Impaired fasting glucose: Secondary | ICD-10-CM | POA: Diagnosis not present

## 2022-09-25 DIAGNOSIS — E8881 Metabolic syndrome: Secondary | ICD-10-CM | POA: Diagnosis not present

## 2022-09-25 DIAGNOSIS — Z6824 Body mass index (BMI) 24.0-24.9, adult: Secondary | ICD-10-CM | POA: Diagnosis not present

## 2022-09-25 DIAGNOSIS — C37 Malignant neoplasm of thymus: Secondary | ICD-10-CM | POA: Diagnosis not present

## 2022-09-25 DIAGNOSIS — G43109 Migraine with aura, not intractable, without status migrainosus: Secondary | ICD-10-CM | POA: Diagnosis not present

## 2022-09-25 DIAGNOSIS — M1712 Unilateral primary osteoarthritis, left knee: Secondary | ICD-10-CM | POA: Diagnosis not present

## 2022-09-26 NOTE — Telephone Encounter (Signed)
I have reviewed the labs and am not sure if the mild changes are a result of cellcept. I would advice continuing the medications and we will discuss in detail at time of return clinic visit in a couple of weeks

## 2022-09-30 ENCOUNTER — Other Ambulatory Visit: Payer: Self-pay | Admitting: Rheumatology

## 2022-10-01 NOTE — Telephone Encounter (Signed)
Last Fill: 08/04/2022  Next Visit: 12/04/2022  Last Visit: 08/25/2022  DX: Dermatomyositis   Current Dose per office note on 08/25/2022: prednisone 1 to 2 mg p.o. daily.   Okay to refill prednisone?

## 2022-10-04 DIAGNOSIS — E119 Type 2 diabetes mellitus without complications: Secondary | ICD-10-CM | POA: Diagnosis not present

## 2022-10-04 DIAGNOSIS — E782 Mixed hyperlipidemia: Secondary | ICD-10-CM | POA: Diagnosis not present

## 2022-10-04 DIAGNOSIS — M810 Age-related osteoporosis without current pathological fracture: Secondary | ICD-10-CM | POA: Diagnosis not present

## 2022-10-14 ENCOUNTER — Ambulatory Visit (INDEPENDENT_AMBULATORY_CARE_PROVIDER_SITE_OTHER): Payer: Medicare Other | Admitting: Pulmonary Disease

## 2022-10-14 ENCOUNTER — Encounter: Payer: Self-pay | Admitting: Pulmonary Disease

## 2022-10-14 VITALS — BP 122/62 | HR 81 | Temp 98.1°F | Ht 66.0 in | Wt 152.0 lb

## 2022-10-14 DIAGNOSIS — J849 Interstitial pulmonary disease, unspecified: Secondary | ICD-10-CM | POA: Diagnosis not present

## 2022-10-14 DIAGNOSIS — Z5181 Encounter for therapeutic drug level monitoring: Secondary | ICD-10-CM | POA: Diagnosis not present

## 2022-10-14 NOTE — Progress Notes (Signed)
Marisa Orozco    [id number]    [birth year]  Primary Care Physician:Daniel, Lucita Lora, MD  Referring Physician: Richardean Chimera, MD [address] [place],  [place] [address]  Chief complaint: Consult for interstitial lung disease  HPI: 72 y.o. 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   She has longstanding history of dermatomyositis, Sjogren's syndrome.  Diagnosed initially as a teenager.  She was treated with glucocorticoids at that time.  She had a flareup in 2017 and a repeat muscle biopsy.  She is currently under the care of Dr. Corliss Skains and being treated with methotrexate, Plaquenil and prednisone with good control of symptoms.  Dr. Corliss Skains noted some lung crackles on examination and sent her for evaluation of interstitial lung disease.  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  She was evaluated by Dr. 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  She has been followed radiographically.  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  No mold, hot tub, Jacuzzi Smoking history: 25-pack-year smoker.  Quit in 2005 Travel history: Previously lived in [place].  No significant recent travel Relevant family history: Mother has COPD.  She was a smoker.  Interim history: Taken off methotrexate and started on CellCept 500 mg twice daily.  CellCept was increased to 1 g twice daily in May 2024 She is tolerating the  medication well and in fact states that her joint pain is better  Outpatient Encounter Medications as of 10/14/2022  Medication Sig   Acetaminophen (TYLENOL PO) Take by mouth as needed.   alendronate (FOSAMAX) 70 MG tablet Take 70 mg by mouth once a week.   Cholecalciferol (VITAMIN D3) 1000 UNIT/SPRAY LIQD Take 5 sprays by mouth daily.    hydroxychloroquine (PLAQUENIL) 200 MG tablet TAKE ONE TABLET BY MOUTH TWICE DAILY MONDAY THROUGH FRIDAY (Patient taking differently: TAKE ONE TABLET BY MOUTH DAILY MONDAY THROUGH FRIDAY)   Magnesium Glycinate POWD 400 mg by Does not apply route 2 (two) times daily.   Multiple Vitamin (MULTIVITAMIN PO) Take by mouth daily.   mycophenolate (CELLCEPT) 500 MG tablet Take 2 tablets (1,000 mg total) by mouth 2 (two) times daily.   predniSONE (DELTASONE) 1 MG tablet TAKE 2 TABLETS (2 MG TOTAL) BY MOUTH DAILY WITH BREAKFAST   sertraline (ZOLOFT) 50 MG tablet Take 75 mg by mouth daily. Taking 75mg  daily   vitamin C (ASCORBIC ACID) 250 MG tablet Take 250 mg by mouth daily.   VITAMIN K PO Take 1 tablet by mouth daily.    No facility-administered encounter medications on file as of 10/14/2022.   Physical Exam: Blood pressure 136/84, pulse 78, height 5\' 6"  (1.676 m), weight 151 lb 6.4 oz (68.7 kg), SpO2 97 %. Gen:      No acute distress HEENT:  EOMI, sclera anicteric Neck:     No masses; no thyromegaly Lungs:   Bibasal crackles CV:         Regular rate and rhythm; no murmurs Abd:      +  bowel sounds; soft, non-tender; no palpable masses, no distension Ext:    No edema; adequate peripheral perfusion Skin:      Warm and dry; no rash Neuro: alert and oriented x 3 Psych: normal mood and affect   Data Reviewed: Imaging: CT abdomen pelvis 01/30/2016- minimal basal reticulation. CT chest 09/30/2018-stable anterior mediastinal soft tissue density, mild basilar fibrotic changes CT abdomen pelvis 12/31/2020-increase in size of basal mild fibrotic changes. High resolution  CT 07/08/2022-basilar predominant interstitial lung disease and probable UIP pattern.  Progression since 2020, stable anterior mediastinal soft tissue mass I have reviewed the images personally.  PFTs: 06/30/2022 FVC 2.48 [80%], FEV1 2.05 [88%], F/F33, TLC 4.57 [87%], DLCO 20.23 [100%] Normal test  Labs: CBC, CMP 06/11/2022-normal limits Hypersensitive panel 06/11/2022-negative  Assessment:  Evaluation for interstitial lung disease She has history of dermatomyositis and Sjogren's syndrome under good control with a regimen of methotrexate, Plaquenil and prednisone.  There is down pillows in her living room but exposure appears to be minimal.  She does have minimal crackles on examination.  Review of her lung imaging shows basal mild changes of reticulation back in 2017 which appears to have increased on the latest high resolution CT  Appearance does not appear to be consistent with hypersensitivity pneumonitis and serologies are negative I advised her to get rid of the down pillows in the living room to be on the safe side  Reviewed findings in detail with patient.  We have stopped methotrexate and started CellCept for better treatment of interstitial lung disease.  So far she is tolerating it well We will hold at current dose since she is doing well Check hepatic panel, CBC and CK in 1 month for monitoring She may need addition of antifibrotic therapy in the future if there is progression of pulmonary fibrosis and spite of good dose of CellCept.  Plan/Recommendations: - Get rid of down pillows - Increase CellCept to 1 g twice daily - Check labs for monitoring - Follow-up CT in 3 months  Chilton Greathouse MD [facility] 10/14/2022, 3:56 PM  CC: Richardean Chimera, MD

## 2022-10-14 NOTE — Progress Notes (Signed)
Marisa Orozco    [id number]    1950/08/16  Primary Care Physician:Daniel, Lucita Lora, MD  Referring Physician: Richardean Chimera, MD [address] [place],  [place] [address]  Chief complaint: Consult for interstitial lung disease  HPI: 72 y.o. 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   She has longstanding history of dermatomyositis, Sjogren's syndrome.  Diagnosed initially as a teenager.  She was treated with glucocorticoids at that time.  She had a flareup in 2017 and a repeat muscle biopsy.  She is currently under the care of Dr. Corliss Skains and being treated with methotrexate, Plaquenil and prednisone with good control of symptoms.  Dr. Corliss Skains noted some lung crackles on examination and sent her for evaluation of interstitial lung disease.  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  She was evaluated by Dr. 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  She has been followed radiographically.  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  No mold, hot tub, Jacuzzi Smoking history: 25-pack-year smoker.  Quit in 2005 Travel history: Previously lived in [place].  No significant recent travel Relevant family history: Mother has COPD.  She was a smoker.  Interim history: Taken off methotrexate and started on CellCept 500 mg twice daily She is tolerating the medication well and in fact states that her joint pain is  better  Outpatient Encounter Medications as of 10/14/2022  Medication Sig   Acetaminophen (TYLENOL PO) Take by mouth as needed.   alendronate (FOSAMAX) 70 MG tablet Take 70 mg by mouth once a week.   Cholecalciferol (VITAMIN D3) 1000 UNIT/SPRAY LIQD Take 5 sprays by mouth daily.    hydroxychloroquine (PLAQUENIL) 200 MG tablet TAKE ONE TABLET BY MOUTH TWICE DAILY MONDAY THROUGH FRIDAY (Patient taking differently: TAKE ONE TABLET BY MOUTH DAILY MONDAY THROUGH FRIDAY)   Magnesium Glycinate POWD 400 mg by Does not apply route 2 (two) times daily.   Multiple Vitamin (MULTIVITAMIN PO) Take by mouth daily.   mycophenolate (CELLCEPT) 500 MG tablet Take 2 tablets (1,000 mg total) by mouth 2 (two) times daily.   predniSONE (DELTASONE) 1 MG tablet TAKE 2 TABLETS (2 MG TOTAL) BY MOUTH DAILY WITH BREAKFAST   sertraline (ZOLOFT) 50 MG tablet Take 75 mg by mouth daily. Taking 75mg  daily   vitamin C (ASCORBIC ACID) 250 MG tablet Take 250 mg by mouth daily.   VITAMIN K PO Take 1 tablet by mouth daily.    No facility-administered encounter medications on file as of 10/14/2022.   Physical Exam: Blood pressure 136/84, pulse 78, height 5\' 6"  (1.676 m), weight 151 lb 6.4 oz (68.7 kg), SpO2 97 %. Gen:      No acute distress HEENT:  EOMI, sclera anicteric Neck:     No masses; no thyromegaly Lungs:   Bibasal crackles CV:         Regular rate and rhythm; no murmurs Abd:      + bowel sounds; soft, non-tender; no palpable masses, no distension  Ext:    No edema; adequate peripheral perfusion Skin:      Warm and dry; no rash Neuro: alert and oriented x 3 Psych: normal mood and affect   Data Reviewed: Imaging: CT abdomen pelvis 01/30/2016- minimal basal reticulation. CT chest 09/30/2018-stable anterior mediastinal soft tissue density, mild basilar fibrotic changes CT abdomen pelvis 12/31/2020-increase in size of basal mild fibrotic changes. High resolution CT 07/08/2022-basilar predominant interstitial lung disease  and probable UIP pattern.  Progression since 2020, stable anterior mediastinal soft tissue mass I have reviewed the images personally.  PFTs: 06/30/2022 FVC 2.48 [80%], FEV1 2.05 [88%], F/F33, TLC 4.57 [87%], DLCO 20.23 [100%] Normal test  Labs: CBC, CMP 06/11/2022-normal limits Hypersensitive panel 06/11/2022-negative  Assessment:  Evaluation for interstitial lung disease She has history of dermatomyositis and Sjogren's syndrome under good control with a regimen of methotrexate, Plaquenil and prednisone.  There is down pillows in her living room but exposure appears to be minimal.  She does have minimal crackles on examination.  Review of her lung imaging shows basal mild changes of reticulation back in 2017 which appears to have increased on the latest high resolution CT  Appearance does not appear to be consistent with hypersensitivity pneumonitis and serologies are negative I advised her to get rid of the down pillows in the living room to be on the safe side  Reviewed findings in detail with patient.  We have stopped methotrexate and started CellCept for better treatment of interstitial lung disease.  So far she is tolerating it well Check hepatic panel, CBC and CK today Increase CellCept to 1 g twice daily She may need addition of antifibrotic therapy in the future if there is progression of pulmonary fibrosis and spite of good dose of CellCept.  Plan/Recommendations: - Get rid of down pillows - Continue CellCept to 1 g twice daily - Check labs for monitoring  Chilton Greathouse MD [facility] 10/14/2022, 3:57 PM  CC: Richardean Chimera, MD

## 2022-10-14 NOTE — Patient Instructions (Addendum)
I am glad you are doing well with your symptoms Will continue CellCept at current dose Order CBC with differential, comprehensive metabolic panel and CK levels.  You can return to our lab next month to get them drawn  Will get a high-resolution CT in 3 months and return to clinic in 3 months after scan.

## 2022-10-25 ENCOUNTER — Other Ambulatory Visit: Payer: Self-pay | Admitting: Rheumatology

## 2022-10-26 NOTE — Telephone Encounter (Signed)
Last Fill: 10/01/2022  Next Visit: 12/04/2022  Last Visit: 08/25/2022  Dx: Dermatomyositis   Current Dose per office note on 08/25/2022: prednisone 1 to 2 mg p.o. daily.   Okay to refill Prednisone?

## 2022-11-05 ENCOUNTER — Other Ambulatory Visit: Payer: Self-pay | Admitting: Pulmonary Disease

## 2022-11-14 ENCOUNTER — Encounter: Payer: Self-pay | Admitting: Pulmonary Disease

## 2022-11-14 DIAGNOSIS — Z5181 Encounter for therapeutic drug level monitoring: Secondary | ICD-10-CM

## 2022-11-14 DIAGNOSIS — J849 Interstitial pulmonary disease, unspecified: Secondary | ICD-10-CM

## 2022-11-20 NOTE — Telephone Encounter (Signed)
Thank you for speaking with me today.  I have ordered blood counts and coagulation labs to make sure they are okay Please keep an appointment with the dentist as scheduled.

## 2022-11-25 ENCOUNTER — Other Ambulatory Visit: Payer: Self-pay | Admitting: *Deleted

## 2022-11-25 ENCOUNTER — Encounter: Payer: Self-pay | Admitting: Pulmonary Disease

## 2022-11-25 ENCOUNTER — Other Ambulatory Visit: Payer: Self-pay | Admitting: Rheumatology

## 2022-11-25 ENCOUNTER — Other Ambulatory Visit: Payer: Medicare Other

## 2022-11-25 DIAGNOSIS — J849 Interstitial pulmonary disease, unspecified: Secondary | ICD-10-CM

## 2022-11-25 DIAGNOSIS — Z5181 Encounter for therapeutic drug level monitoring: Secondary | ICD-10-CM

## 2022-11-25 DIAGNOSIS — M3501 Sicca syndrome with keratoconjunctivitis: Secondary | ICD-10-CM

## 2022-11-25 LAB — CBC WITH DIFFERENTIAL/PLATELET
Basophils Absolute: 0.1 10*3/uL (ref 0.0–0.1)
Basophils Relative: 0.7 % (ref 0.0–3.0)
Eosinophils Absolute: 0.1 10*3/uL (ref 0.0–0.7)
Eosinophils Relative: 1.6 % (ref 0.0–5.0)
HCT: 43.8 % (ref 36.0–46.0)
Hemoglobin: 14.2 g/dL (ref 12.0–15.0)
Lymphocytes Relative: 19.6 % (ref 12.0–46.0)
Lymphs Abs: 1.7 10*3/uL (ref 0.7–4.0)
MCHC: 32.5 g/dL (ref 30.0–36.0)
MCV: 88.6 fl (ref 78.0–100.0)
Monocytes Absolute: 0.5 10*3/uL (ref 0.1–1.0)
Monocytes Relative: 5.6 % (ref 3.0–12.0)
Neutro Abs: 6.4 10*3/uL (ref 1.4–7.7)
Neutrophils Relative %: 72.5 % (ref 43.0–77.0)
Platelets: 186 10*3/uL (ref 150.0–400.0)
RBC: 4.94 Mil/uL (ref 3.87–5.11)
RDW: 13.9 % (ref 11.5–15.5)
WBC: 8.8 10*3/uL (ref 4.0–10.5)

## 2022-11-25 LAB — COMPREHENSIVE METABOLIC PANEL
ALT: 9 U/L (ref 0–35)
AST: 15 U/L (ref 0–37)
Albumin: 4.6 g/dL (ref 3.5–5.2)
Alkaline Phosphatase: 54 U/L (ref 39–117)
BUN: 24 mg/dL — ABNORMAL HIGH (ref 6–23)
CO2: 28 mEq/L (ref 19–32)
Calcium: 9.6 mg/dL (ref 8.4–10.5)
Chloride: 102 mEq/L (ref 96–112)
Creatinine, Ser: 0.7 mg/dL (ref 0.40–1.20)
GFR: 86.69 mL/min (ref >60.00)
Glucose, Bld: 89 mg/dL (ref 70–99)
Potassium: 3.8 mEq/L (ref 3.5–5.1)
Sodium: 137 mEq/L (ref 135–145)
Total Bilirubin: 0.6 mg/dL (ref 0.2–1.2)
Total Protein: 7.5 g/dL (ref 6.0–8.3)

## 2022-11-25 LAB — PROTIME-INR
INR: 1.1 ratio — ABNORMAL HIGH (ref 0.8–1.0)
Prothrombin Time: 11.5 s (ref 9.6–13.1)

## 2022-11-25 LAB — CK: Total CK: 49 U/L (ref 7–177)

## 2022-11-25 MED ORDER — HYDROXYCHLOROQUINE SULFATE 200 MG PO TABS: 200.0000 mg | ORAL_TABLET | Freq: Every day | ORAL | 0 refills | Status: DC

## 2022-11-25 NOTE — Telephone Encounter (Signed)
Last Fill: 06/04/2022  Eye exam: 02/23/2022 WNL    Labs: 08/14/2022 Monocytes Absolute 1.1  Next Visit: 12/16/2022  Last Visit: 08/25/2022  [id number]   Current Dose per office note 08/25/2022: Patient would like to decrease the dose of hydroxychloroquine to 1 tablet p.o. daily. I was in agreement.   Okay to refill Plaquenil?

## 2022-11-25 NOTE — Telephone Encounter (Signed)
I returned patient's call.  Patient states she has not noticed any difference in her symptoms by reducing the dose of hydroxychloroquine.  She wants to stop hydroxychloroquine.  I advised her to stop hydroxychloroquine.  I also advised her to contact us if she develops any new symptoms.

## 2022-11-26 DIAGNOSIS — M8588 Other specified disorders of bone density and structure, other site: Secondary | ICD-10-CM | POA: Diagnosis not present

## 2022-11-26 DIAGNOSIS — M810 Age-related osteoporosis without current pathological fracture: Secondary | ICD-10-CM | POA: Diagnosis not present

## 2022-11-26 DIAGNOSIS — Z780 Asymptomatic menopausal state: Secondary | ICD-10-CM | POA: Diagnosis not present

## 2022-11-26 MED ORDER — PREDNISONE 1 MG PO TABS: 2.0000 mg | ORAL_TABLET | Freq: Every day | ORAL | 0 refills | Status: DC

## 2022-11-26 NOTE — Telephone Encounter (Signed)
Last Fill: 10/26/2022   Next Visit: 12/04/2022   Last Visit: 08/25/2022   Dx: Dermatomyositis    Current Dose per office note on 08/25/2022: prednisone 1 to 2 mg p.o. daily.    Okay to refill Prednisone?

## 2022-12-04 ENCOUNTER — Ambulatory Visit: Payer: Medicare Other | Admitting: Rheumatology

## 2022-12-04 NOTE — Progress Notes (Signed)
Office Visit Note  Patient: Marisa Orozco             Date of Birth: 11-05-1950           MRN: [id number]             PCP: Richardean Chimera, MD Referring: Richardean Chimera, MD Visit Date: 12/16/2022 Occupation: @GUAROCC @  Subjective:  Medication management  History of Present Illness: Marisa Orozco is a 72 y.o. female with dermatomyositis, Sjogren's, osteoarthritis and osteoporosis.  She has been taking CellCept 500 mg, 2 tablets p.o. twice daily without any interruption.  She stopped hydroxychloroquine a month ago.  She continues to take prednisone 2 mg p.o. daily.  She denies any increased muscular weakness or discomfort.  She denies any shortness of breath or dysphagia.  He continues to have some discomfort in her right knee.  She continues to have dry mouth symptoms.  Raynauds continues to be active.  She denies any history of digital ulcers.  She was recently evaluated by Dr. Isaiah Serge and no change in treatment was advised..  Repeat high resolution CT scan is pending for October 2024.  She had a recent DEXA scan.  Patient states Dr. Reuel Boom told her that she had osteopenia.    Activities of Daily Living:  Patient reports morning stiffness for 0 minutes.   Patient Denies nocturnal pain.  Difficulty dressing/grooming: Denies Difficulty climbing stairs: Reports Difficulty getting out of chair: Denies Difficulty using hands for taps, buttons, cutlery, and/or writing: Denies  Review of Systems  Constitutional:  Negative for fatigue.  HENT:  Positive for mouth dryness. Negative for mouth sores.   Eyes:  Negative for dryness.  Respiratory:  Negative for shortness of breath.   Cardiovascular:  Positive for palpitations. Negative for chest pain.  Gastrointestinal:  Negative for blood in stool, constipation and diarrhea.  Endocrine: Negative for increased urination.  Genitourinary:  Negative for involuntary urination.  Musculoskeletal:  Positive for joint pain, joint pain and muscle  tenderness. Negative for gait problem, joint swelling, myalgias, muscle weakness, morning stiffness and myalgias.  Skin:  Positive for color change. Negative for rash, hair loss and sensitivity to sunlight.  Allergic/Immunologic: Negative for susceptible to infections.  Neurological:  Negative for dizziness and headaches.  Hematological:  Negative for swollen glands.  Psychiatric/Behavioral:  Positive for sleep disturbance. Negative for depressed mood. The patient is nervous/anxious.     PMFS History:  Patient Active Problem List   Diagnosis Date Noted   ILD (interstitial lung disease) (HCC) 08/25/2022   Palpitations 01/15/2021   Hypothyroidism 07/26/2019   Unilateral primary osteoarthritis, right knee 02/03/2018   Primary osteoarthritis of both hands 09/08/2017   Sjogren's disease (HCC) 02/04/2016   Dermatomyositis (HCC) 02/04/2016   Former smoker 02/04/2016   Thymoma 02/04/2016   Fatigue 02/15/2012    Past Medical History:  Diagnosis Date   Allergy    Anxiety    Cancer (HCC)    basal cell carcinoma - face   Dermatomyositis (HCC) 02/04/2016   Jo 1 Positive  Skin Bx- Subtle interface Dermatitis    Former smoker 02/04/2016   GERD (gastroesophageal reflux disease)    otc med prn   PONV (postoperative nausea and vomiting)    Raynaud's disease    Sjogren's disease (HCC) 02/04/2016   Positive ANA, Positive Ro, Positive CCP, Negative RF, Parotitis, Positive Sicca   Sjogren's syndrome (HCC)    from MD note    Family History  Problem Relation Age of Onset  COPD Mother    Heart disease Mother    Diabetes Father    Kidney disease Father    Epilepsy Daughter    Rectal cancer Maternal Uncle    Esophageal cancer Neg Hx    Stomach cancer Neg Hx    Ulcerative colitis Neg Hx    Past Surgical History:  Procedure Laterality Date   BREAST SURGERY     augmentation   BUNIONECTOMY     right foot   COLONOSCOPY     DIAGNOSTIC LAPAROSCOPY     DILATION AND CURETTAGE OF UTERUS   04/15/2011   Procedure: DILATATION AND CURETTAGE;  Surgeon: Levi Aland;  Location: [facility];  Service: Gynecology;  Laterality: N/A;   IR RADIOLOGIST EVAL & MGMT  02/04/2021   MUSCLE BIOPSY Left 01/02/2016   Procedure: MUSCLE BIOPSY LEFT DELTOID;  Surgeon: Avel Peace, MD;  Location: [facility];  Service: General;  Laterality: Left;  left deltoid muscle bx   NASAL SEPTUM SURGERY     SVD     x 2   TONSILLECTOMY     WISDOM TOOTH EXTRACTION     Social History   Social History Narrative   Not on file   Immunization History  Administered Date(s) Administered   Influenza, High Dose Seasonal PF 01/07/2018   Influenza-Unspecified 01/04/2014   PFIZER(Purple Top)SARS-COV-2 Vaccination 04/26/2019, 05/17/2019, 12/12/2019   Pneumococcal Conjugate-13 01/05/2016   Zoster Recombinant(Shingrix) 08/11/2017, 12/17/2017     Objective: Vital Signs: BP 132/80 (BP Location: Left Arm, Patient Position: Sitting, Cuff Size: Small)   Pulse 80   Resp 13   Ht 5\' 6"  (1.676 m)   Wt 152 lb (68.9 kg)   BMI 24.53 kg/m    Physical Exam Vitals and nursing note reviewed.  Constitutional:      Appearance: She is well-developed.  HENT:     Head: Normocephalic and atraumatic.  Eyes:     Conjunctiva/sclera: Conjunctivae normal.  Cardiovascular:     Rate and Rhythm: Normal rate and regular rhythm.     Heart sounds: Normal heart sounds.  Pulmonary:     Effort: Pulmonary effort is normal.     Breath sounds: Normal breath sounds.     Comments: Crackles in bilateral lung bases. Abdominal:     General: Bowel sounds are normal.     Palpations: Abdomen is soft.  Musculoskeletal:     Cervical back: Normal range of motion.  Lymphadenopathy:     Cervical: No cervical adenopathy.  Skin:    General: Skin is warm and dry.     Capillary Refill: Capillary refill takes 2 to 3 seconds.  Neurological:     Mental Status: She is alert and oriented to person, place, and time.  Psychiatric:         Behavior: Behavior normal.      Musculoskeletal Exam: Cervical, thoracic and lumbar spine were in good range of motion.  Shoulder joints, elbow joints, wrist joints, MCPs PIPs and DIPs been good range of motion with no synovitis.  Hip joints and knee joints were in good range of motion.  She had discomfort range of motion of her right knee joint without any warmth swelling or effusion.  There was no tenderness over ankles or MTPs.  CDAI Exam: CDAI Score: -- Patient Global: --; Provider Global: -- Swollen: --; Tender: -- Joint Exam 12/16/2022   No joint exam has been documented for this visit   There is currently no information documented on the homunculus. Go to  the Rheumatology activity and complete the homunculus joint exam.  Investigation: No additional findings.  Imaging: No results found.  Recent Labs: Lab Results  Component Value Date   WBC 8.8 11/25/2022   HGB 14.2 11/25/2022   PLT 186.0 11/25/2022   NA 137 11/25/2022   K 3.8 11/25/2022   CL 102 11/25/2022   CO2 28 11/25/2022   GLUCOSE 89 11/25/2022   BUN 24 (H) 11/25/2022   CREATININE 0.70 11/25/2022   BILITOT 0.6 11/25/2022   ALKPHOS 54 11/25/2022   AST 15 11/25/2022   ALT 9 11/25/2022   PROT 7.5 11/25/2022   ALBUMIN 4.6 11/25/2022   CALCIUM 9.6 11/25/2022   GFRAA 87 06/25/2020   QFTBGOLDPLUS NEGATIVE 07/07/2017    Speciality Comments: PLQ Eye Exam: 02/23/2022 WNL @ [facility]. Follow up 1 year.   Fosamax May 08, 2021  Procedures:  No procedures performed Allergies: Codeine, Flagyl [metronidazole hcl], Nexium [esomeprazole magnesium], Imuran [azathioprine], Sulfa antibiotics, and Sulfa drugs cross reactors   Assessment / Plan:     Visit Diagnoses: Dermatomyositis (HCC) - Jo 1+, Positive muscle biopsy for dermatomyositis: -Patient denies any increased muscular weakness or tenderness.  She had no muscular weakness on the examination today.  Recent labs showed CK 49 on November 25, 2022.   She denies any increase shortness of breath or dysphagia.  Patient discontinued hydroxychloroquine about a month ago and is not noticing any flares.  She denies any history of rash.  Use of sunscreen was emphasized.  Plan: CK  High risk medication use - Cellcept 500 mg, 2 tablets p.o. twice daily,, prednisone 1 to 2 mg p.o. daily. PLQ Eye Exam: 02/23/2022 -Labs obtained on November 25, 2022 CBC and CMP was normal.  She was advised to get labs every 3 months.  Information immunization was placed in the AVS.  Plan: CBC with Differential/Platelet, COMPLETE METABOLIC PANEL WITH GFR  Sjogren's syndrome with keratoconjunctivitis sicca (HCC)-over-the-counter products were discussed.  ILD (interstitial lung disease) (HCC) - high-resolution CT on July 08, 2022 was suspicious of UIP pattern.  She has been under care of Dr. Isaiah Serge and has been on CellCept since April 2024.  She was evaluated by Dr. Isaiah Serge recently had no change in treatment was advised.  Repeat high-resolution CT is pending.  She had crackles in her lung bases today.  She denies any increased shortness of breath.  Long term (current) use of systemic steroids - she has been tapering prednisone gradually.  She is currently taking 1.5 or 2 mg prednisone daily.  Primary osteoarthritis of both hands-she has mild PIP and DIP thickening with no synovitis.  Chronic pain of right knee-she continues to have right knee joint discomfort and stiffness.  She had intentional weight loss which has been helpful.  Patient states she gained some weight recently.  Primary osteoarthritis of both knees-she has intermittent discomfort in her right knee.  Raynaud's disease without gangrene-keeping core temperature warm and warm clothing was discussed.  Anxiety-she is on Zoloft.  Age-related osteoporosis without current pathological fracture - Patient states that she had a repeat DEXA with a T score of -2.2.  Fosamax since May 08, 2021.  Patient states that she  had a recent DEXA scan.  I do not have results available.  She will bring results at the follow-up visit.  Patient states that the DEXA scan was in osteopenia range.  Vitamin D deficiency -she has been taking vitamin D.  Will check vitamin D level with the next labs.  Plan: VITAMIN D 25 Hydroxy (Vit-D Deficiency, Fractures)  Thymoma  Former smoker  Orders: Orders Placed This Encounter  Procedures   CBC with Differential/Platelet   COMPLETE METABOLIC PANEL WITH GFR   CK   VITAMIN D 25 Hydroxy (Vit-D Deficiency, Fractures)   No orders of the defined types were placed in this encounter.    Follow-Up Instructions: Return in about 3 months (around 03/17/2023) for Dermatomyositis, Sjogren's, ILD, Osteoarthritis, Osteoporosis.   Pollyann Savoy, MD  Note - This record has been created using Animal nutritionist.  Chart creation errors have been sought, but may not always  have been located. Such creation errors do not reflect on  the standard of medical care.

## 2022-12-16 ENCOUNTER — Ambulatory Visit: Payer: Medicare Other | Attending: Rheumatology | Admitting: Rheumatology

## 2022-12-16 ENCOUNTER — Encounter: Payer: Self-pay | Admitting: Rheumatology

## 2022-12-16 VITALS — BP 132/80 | HR 80 | Resp 13 | Ht 66.0 in | Wt 152.0 lb

## 2022-12-16 DIAGNOSIS — F419 Anxiety disorder, unspecified: Secondary | ICD-10-CM | POA: Insufficient documentation

## 2022-12-16 DIAGNOSIS — M3501 Sicca syndrome with keratoconjunctivitis: Secondary | ICD-10-CM | POA: Diagnosis present

## 2022-12-16 DIAGNOSIS — M170 Bilateral primary osteoarthritis of knee: Secondary | ICD-10-CM | POA: Diagnosis not present

## 2022-12-16 DIAGNOSIS — M810 Age-related osteoporosis without current pathological fracture: Secondary | ICD-10-CM | POA: Insufficient documentation

## 2022-12-16 DIAGNOSIS — E559 Vitamin D deficiency, unspecified: Secondary | ICD-10-CM | POA: Diagnosis not present

## 2022-12-16 DIAGNOSIS — I7300 Raynaud's syndrome without gangrene: Secondary | ICD-10-CM | POA: Insufficient documentation

## 2022-12-16 DIAGNOSIS — Z7952 Long term (current) use of systemic steroids: Secondary | ICD-10-CM | POA: Insufficient documentation

## 2022-12-16 DIAGNOSIS — M19042 Primary osteoarthritis, left hand: Secondary | ICD-10-CM | POA: Insufficient documentation

## 2022-12-16 DIAGNOSIS — M25561 Pain in right knee: Secondary | ICD-10-CM | POA: Diagnosis not present

## 2022-12-16 DIAGNOSIS — G8929 Other chronic pain: Secondary | ICD-10-CM | POA: Diagnosis not present

## 2022-12-16 DIAGNOSIS — D4989 Neoplasm of unspecified behavior of other specified sites: Secondary | ICD-10-CM | POA: Insufficient documentation

## 2022-12-16 DIAGNOSIS — J849 Interstitial pulmonary disease, unspecified: Secondary | ICD-10-CM | POA: Insufficient documentation

## 2022-12-16 DIAGNOSIS — Z87891 Personal history of nicotine dependence: Secondary | ICD-10-CM | POA: Diagnosis not present

## 2022-12-16 DIAGNOSIS — Z8639 Personal history of other endocrine, nutritional and metabolic disease: Secondary | ICD-10-CM

## 2022-12-16 DIAGNOSIS — M3313 Other dermatomyositis without myopathy: Secondary | ICD-10-CM | POA: Diagnosis present

## 2022-12-16 DIAGNOSIS — M19041 Primary osteoarthritis, right hand: Secondary | ICD-10-CM | POA: Diagnosis not present

## 2022-12-16 DIAGNOSIS — Z79899 Other long term (current) drug therapy: Secondary | ICD-10-CM | POA: Diagnosis present

## 2022-12-16 NOTE — Patient Instructions (Signed)
Standing Labs We placed an order today for your standing lab work.   Please have your standing labs drawn in November and every 3 months  Please have your labs drawn 2 weeks prior to your appointment so that the provider can discuss your lab results at your appointment, if possible.  Please note that you may see your imaging and lab results in MyChart before we have reviewed them. We will contact you once all results are reviewed. Please allow our office up to 72 hours to thoroughly review all of the results before contacting the office for clarification of your results.  WALK-IN LAB HOURS  Monday through Thursday from 8:00 am -12:30 pm and 1:00 pm-5:00 pm and Friday from 8:00 am-12:00 pm.  Patients with office visits requiring labs will be seen before walk-in labs.  You may encounter longer than normal wait times. Please allow additional time. Wait times may be shorter on  Monday and Thursday afternoons.  We do not book appointments for walk-in labs. We appreciate your patience and understanding with our staff.   Labs are drawn by [facility]. Please bring your co-pay at the time of your lab draw.  You may receive a bill from [facility] for your lab work.  Please note if you are on Hydroxychloroquine and and an order has been placed for a Hydroxychloroquine level,  you will need to have it drawn 4 hours or more after your last dose.  If you wish to have your labs drawn at another location, please call the office 24 hours in advance so we can fax the orders.  The office is located at [address], [place], [place] [address]   If you have any questions regarding directions or hours of operation,  please call [phone].   As a reminder, please drink plenty of water prior to coming for your lab work. Thanks!   Vaccines You are taking a medication(s) that can suppress your immune system.  The following immunizations are recommended: Flu annually Covid-19  RSV Td/Tdap (tetanus,  diphtheria, pertussis) every 10 years Pneumonia (Prevnar 15 then Pneumovax 23 at least 1 year apart.  Alternatively, can take Prevnar 20 without needing additional dose) Shingrix: 2 doses from 4 weeks to 6 months apart  Please check with your PCP to make sure you are up to date.

## 2023-01-04 DIAGNOSIS — E039 Hypothyroidism, unspecified: Secondary | ICD-10-CM | POA: Diagnosis not present

## 2023-01-04 DIAGNOSIS — E782 Mixed hyperlipidemia: Secondary | ICD-10-CM | POA: Diagnosis not present

## 2023-01-04 DIAGNOSIS — M3390 Dermatopolymyositis, unspecified, organ involvement unspecified: Secondary | ICD-10-CM | POA: Diagnosis not present

## 2023-01-04 DIAGNOSIS — M810 Age-related osteoporosis without current pathological fracture: Secondary | ICD-10-CM | POA: Diagnosis not present

## 2023-01-04 DIAGNOSIS — F331 Major depressive disorder, recurrent, moderate: Secondary | ICD-10-CM | POA: Diagnosis not present

## 2023-01-05 ENCOUNTER — Other Ambulatory Visit: Payer: Self-pay | Admitting: *Deleted

## 2023-01-05 MED ORDER — PREDNISONE 1 MG PO TABS: 2.0000 mg | ORAL_TABLET | Freq: Every day | ORAL | 0 refills | Status: DC

## 2023-01-05 NOTE — Telephone Encounter (Signed)
Last Fill: 11/26/2022  Next Visit: 04/27/2023  Last Visit: 12/16/2022  Dx: Dermatomyositis   Current Dose per office note on 12/16/2022: prednisone 1 to 2 mg p.o. daily   Okay to refill Prednisone?

## 2023-01-08 DIAGNOSIS — K7581 Nonalcoholic steatohepatitis (NASH): Secondary | ICD-10-CM | POA: Diagnosis not present

## 2023-01-08 DIAGNOSIS — E7849 Other hyperlipidemia: Secondary | ICD-10-CM | POA: Diagnosis not present

## 2023-01-08 DIAGNOSIS — Z1321 Encounter for screening for nutritional disorder: Secondary | ICD-10-CM | POA: Diagnosis not present

## 2023-01-08 DIAGNOSIS — I10 Essential (primary) hypertension: Secondary | ICD-10-CM | POA: Diagnosis not present

## 2023-01-08 LAB — LAB REPORT - SCANNED: EGFR: 72.7

## 2023-01-14 ENCOUNTER — Telehealth: Payer: Self-pay | Admitting: Pulmonary Disease

## 2023-01-14 ENCOUNTER — Other Ambulatory Visit: Payer: Medicare Other

## 2023-01-14 NOTE — Telephone Encounter (Signed)
Patient would like CT scan scheduled at [facility]. Patient phone number is [phone].

## 2023-01-15 DIAGNOSIS — E7849 Other hyperlipidemia: Secondary | ICD-10-CM | POA: Diagnosis not present

## 2023-01-15 DIAGNOSIS — M1712 Unilateral primary osteoarthritis, left knee: Secondary | ICD-10-CM | POA: Diagnosis not present

## 2023-01-15 DIAGNOSIS — I700 Atherosclerosis of aorta: Secondary | ICD-10-CM | POA: Diagnosis not present

## 2023-01-15 DIAGNOSIS — K7581 Nonalcoholic steatohepatitis (NASH): Secondary | ICD-10-CM | POA: Diagnosis not present

## 2023-01-15 DIAGNOSIS — G43109 Migraine with aura, not intractable, without status migrainosus: Secondary | ICD-10-CM | POA: Diagnosis not present

## 2023-01-15 DIAGNOSIS — C37 Malignant neoplasm of thymus: Secondary | ICD-10-CM | POA: Diagnosis not present

## 2023-01-15 DIAGNOSIS — R7301 Impaired fasting glucose: Secondary | ICD-10-CM | POA: Diagnosis not present

## 2023-01-15 DIAGNOSIS — F410 Panic disorder [episodic paroxysmal anxiety] without agoraphobia: Secondary | ICD-10-CM | POA: Diagnosis not present

## 2023-01-15 DIAGNOSIS — E8881 Metabolic syndrome: Secondary | ICD-10-CM | POA: Diagnosis not present

## 2023-01-15 DIAGNOSIS — M3390 Dermatopolymyositis, unspecified, organ involvement unspecified: Secondary | ICD-10-CM | POA: Diagnosis not present

## 2023-01-15 DIAGNOSIS — Z23 Encounter for immunization: Secondary | ICD-10-CM | POA: Diagnosis not present

## 2023-01-15 DIAGNOSIS — R030 Elevated blood-pressure reading, without diagnosis of hypertension: Secondary | ICD-10-CM | POA: Diagnosis not present

## 2023-01-18 ENCOUNTER — Encounter: Payer: Self-pay | Admitting: Rheumatology

## 2023-01-18 ENCOUNTER — Encounter: Payer: Self-pay | Admitting: Pulmonary Disease

## 2023-01-19 ENCOUNTER — Telehealth: Payer: Self-pay | Admitting: *Deleted

## 2023-01-19 ENCOUNTER — Ambulatory Visit: Payer: Medicare Other | Admitting: Pulmonary Disease

## 2023-01-19 DIAGNOSIS — Z23 Encounter for immunization: Secondary | ICD-10-CM | POA: Diagnosis not present

## 2023-01-19 NOTE — Telephone Encounter (Signed)
Labs received from:[facility]   Drawn on: 01/08/2023  Reviewed by: Dr. Pollyann Savoy   Labs drawn: CBC, CMP, Vitamin D, lipid Panel  Results: Platelets 136   Lymphocytes # 1.6   Lymphocytes %25.1   Granulocytes % 38.2    HDL Cholesterol 39.0   Triglycerides 152

## 2023-01-19 NOTE — Telephone Encounter (Signed)
All these changes are related to CellCept use.  You will be getting lab work every 3 months to monitor labs.

## 2023-01-25 MED ORDER — MYCOPHENOLATE MOFETIL 500 MG PO TABS: 500.0000 mg | ORAL_TABLET | Freq: Two times a day (BID) | ORAL | 3 refills | Status: DC

## 2023-01-25 NOTE — Telephone Encounter (Signed)
I responded to the patient's message as well.  I agree with reducing the dose of CellCept and monitoring her lymphocyte count.  She could not tolerate Imuran in the past.

## 2023-01-25 NOTE — Telephone Encounter (Signed)
I called and discussed with patient.  Due to low platelet counts we will reduce the dose of CellCept to 500 mg twice daily Continue monitoring CBC monthly. If her counts continue to be low then consider change in treatment to azathioprine.

## 2023-01-28 ENCOUNTER — Other Ambulatory Visit: Payer: Self-pay | Admitting: Pulmonary Disease

## 2023-01-28 ENCOUNTER — Other Ambulatory Visit (INDEPENDENT_AMBULATORY_CARE_PROVIDER_SITE_OTHER): Payer: Medicare Other

## 2023-01-28 DIAGNOSIS — J849 Interstitial pulmonary disease, unspecified: Secondary | ICD-10-CM

## 2023-01-28 DIAGNOSIS — Z5181 Encounter for therapeutic drug level monitoring: Secondary | ICD-10-CM

## 2023-01-28 DIAGNOSIS — E559 Vitamin D deficiency, unspecified: Secondary | ICD-10-CM

## 2023-01-28 LAB — CBC WITH DIFFERENTIAL/PLATELET
Basophils Absolute: 0.1 10*3/uL (ref 0.0–0.1)
Basophils Relative: 0.5 % (ref 0.0–3.0)
Eosinophils Absolute: 0.2 10*3/uL (ref 0.0–0.7)
Eosinophils Relative: 1.2 % (ref 0.0–5.0)
HCT: 42.6 % (ref 36.0–46.0)
Hemoglobin: 13.2 g/dL (ref 12.0–15.0)
Lymphocytes Relative: 10.2 % — ABNORMAL LOW (ref 12.0–46.0)
Lymphs Abs: 1.5 10*3/uL (ref 0.7–4.0)
MCHC: 31 g/dL (ref 30.0–36.0)
MCV: 89.7 fL (ref 78.0–100.0)
Monocytes Absolute: 1.2 10*3/uL — ABNORMAL HIGH (ref 0.1–1.0)
Monocytes Relative: 7.7 % (ref 3.0–12.0)
Neutro Abs: 12.2 10*3/uL — ABNORMAL HIGH (ref 1.4–7.7)
Neutrophils Relative %: 80.4 % — ABNORMAL HIGH (ref 43.0–77.0)
Platelets: 160 10*3/uL (ref 150.0–400.0)
RBC: 4.75 Mil/uL (ref 3.87–5.11)
RDW: 14.4 % (ref 11.5–15.5)
WBC: 15.1 10*3/uL — ABNORMAL HIGH (ref 4.0–10.5)

## 2023-01-28 LAB — HEPATIC FUNCTION PANEL
ALT: 6 U/L (ref 0–35)
AST: 10 U/L (ref 0–37)
Albumin: 4.1 g/dL (ref 3.5–5.2)
Alkaline Phosphatase: 54 U/L (ref 39–117)
Bilirubin, Direct: 0.1 mg/dL (ref 0.0–0.3)
Total Bilirubin: 0.6 mg/dL (ref 0.2–1.2)
Total Protein: 6.5 g/dL (ref 6.0–8.3)

## 2023-01-28 LAB — CK: Total CK: 42 U/L (ref 7–177)

## 2023-01-28 NOTE — Progress Notes (Signed)
CK is normal, white cell count is elevated.  Please ask patient if she had recent infection, cortisone injection or increased prednisone use.

## 2023-02-03 ENCOUNTER — Other Ambulatory Visit: Payer: Medicare Other

## 2023-02-03 ENCOUNTER — Ambulatory Visit
Admission: RE | Admit: 2023-02-03 | Discharge: 2023-02-03 | Disposition: A | Discharge status: Home / Self Care | Payer: Medicare Other | Source: Ambulatory Visit | Attending: Pulmonary Disease

## 2023-02-03 DIAGNOSIS — K449 Diaphragmatic hernia without obstruction or gangrene: Secondary | ICD-10-CM | POA: Diagnosis not present

## 2023-02-03 DIAGNOSIS — J849 Interstitial pulmonary disease, unspecified: Secondary | ICD-10-CM

## 2023-02-03 DIAGNOSIS — I700 Atherosclerosis of aorta: Secondary | ICD-10-CM | POA: Diagnosis not present

## 2023-02-03 DIAGNOSIS — I2510 Atherosclerotic heart disease of native coronary artery without angina pectoris: Secondary | ICD-10-CM | POA: Diagnosis not present

## 2023-02-03 DIAGNOSIS — J8410 Pulmonary fibrosis, unspecified: Secondary | ICD-10-CM | POA: Diagnosis not present

## 2023-02-04 ENCOUNTER — Other Ambulatory Visit: Payer: Self-pay | Admitting: Rheumatology

## 2023-02-04 NOTE — Telephone Encounter (Signed)
Last Fill: 01/05/2023   Next Visit: 04/27/2023   Last Visit: 12/16/2022   Dx: Dermatomyositis    Current Dose per office note on 12/16/2022: prednisone 1 to 2 mg p.o. daily    Okay to refill Prednisone?

## 2023-03-08 ENCOUNTER — Other Ambulatory Visit: Payer: Self-pay | Admitting: Rheumatology

## 2023-03-08 NOTE — Progress Notes (Signed)
Office Visit Note  Patient: Marisa Orozco             Date of Birth: 07/17/1950           MRN: [id number]             PCP: Richardean Chimera, MD Referring: Richardean Chimera, MD Visit Date: 03/18/2023 Occupation: @GUAROCC @  Subjective:  Medication management  History of Present Illness: Marisa Orozco is a 72 y.o. female with dermatomyositis, Sjogren's, ILD, osteoarthritis and osteoporosis.  She denies any increased muscular weakness or tenderness.  She continues to have dry mouth and dry eye symptoms.  She continues to have discomfort in her right knee joint.  She denies any discomfort in her hands.  Raynauds is more bothersome during the winter months.  She denies any increased shortness of breath.  Her CellCept dose was decreased to 1 tablet p.o. twice daily by Dr. Isaiah Serge.  She remains on prednisone 2 mg p.o. daily.  She remains on Fosamax 70 mg p.o. daily which was started in February 2023.  She had a DEXA scan November 26, 2022.  She started melatonin for insomnia.    Activities of Daily Living:  Patient reports morning stiffness for 0  minute.   Patient Denies nocturnal pain.  Difficulty dressing/grooming: Denies Difficulty climbing stairs: Reports Difficulty getting out of chair: Denies Difficulty using hands for taps, buttons, cutlery, and/or writing: Denies  Review of Systems  Constitutional:  Negative for fatigue.  HENT:  Positive for mouth dryness. Negative for mouth sores.   Eyes:  Positive for dryness.  Respiratory:  Negative for shortness of breath.   Cardiovascular:  Negative for palpitations and hypertension.  Gastrointestinal:  Negative for blood in stool, constipation and diarrhea.  Endocrine: Negative for increased urination.  Genitourinary:  Negative for difficulty urinating.  Musculoskeletal:  Positive for joint pain and joint pain. Negative for gait problem, myalgias, morning stiffness and myalgias.  Skin:  Positive for color change. Negative for rash and  sensitivity to sunlight.  Allergic/Immunologic: Negative for susceptible to infections.  Neurological:  Negative for dizziness and headaches.  Hematological:  Negative for swollen glands.  Psychiatric/Behavioral:  Positive for sleep disturbance. Negative for depressed mood. The patient is nervous/anxious.     PMFS History:  Patient Active Problem List   Diagnosis Date Noted   ILD (interstitial lung disease) (HCC) 08/25/2022   Palpitations 01/15/2021   Hypothyroidism 07/26/2019   Unilateral primary osteoarthritis, right knee 02/03/2018   Primary osteoarthritis of both hands 09/08/2017   Sjogren's disease (HCC) 02/04/2016   Dermatomyositis (HCC) 02/04/2016   Former smoker 02/04/2016   Thymoma 02/04/2016   Fatigue 02/15/2012    Past Medical History:  Diagnosis Date   Allergy    Anxiety    Cancer (HCC)    basal cell carcinoma - face   Dermatomyositis (HCC) 02/04/2016   Jo 1 Positive  Skin Bx- Subtle interface Dermatitis    Former smoker 02/04/2016   GERD (gastroesophageal reflux disease)    otc med prn   PONV (postoperative nausea and vomiting)    Raynaud's disease    Sjogren's disease (HCC) 02/04/2016   Positive ANA, Positive Ro, Positive CCP, Negative RF, Parotitis, Positive Sicca   Sjogren's syndrome (HCC)    from MD note    Family History  Problem Relation Age of Onset   COPD Mother    Heart disease Mother    Diabetes Father    Kidney disease Father    Epilepsy Daughter  Rectal cancer Maternal Uncle    Esophageal cancer Neg Hx    Stomach cancer Neg Hx    Ulcerative colitis Neg Hx    Past Surgical History:  Procedure Laterality Date   BREAST SURGERY     augmentation   BUNIONECTOMY     right foot   COLONOSCOPY     DIAGNOSTIC LAPAROSCOPY     DILATION AND CURETTAGE OF UTERUS  04/15/2011   Procedure: DILATATION AND CURETTAGE;  Surgeon: Levi Aland;  Location: [facility];  Service: Gynecology;  Laterality: N/A;   IR RADIOLOGIST EVAL & MGMT  02/04/2021   MUSCLE  BIOPSY Left 01/02/2016   Procedure: MUSCLE BIOPSY LEFT DELTOID;  Surgeon: Avel Peace, MD;  Location:  [facility];  Service: General;  Laterality: Left;  left deltoid muscle bx   NASAL SEPTUM SURGERY     SVD     x 2   TONSILLECTOMY     WISDOM TOOTH EXTRACTION     Social History   Social History Narrative   Not on file   Immunization History  Administered Date(s) Administered   Influenza, High Dose Seasonal PF 01/07/2018   Influenza-Unspecified 01/04/2014   PFIZER(Purple Top)SARS-COV-2 Vaccination 04/26/2019, 05/17/2019, 12/12/2019   Pneumococcal Conjugate-13 01/05/2016   Zoster Recombinant(Shingrix) 08/11/2017, 12/17/2017     Objective: Vital Signs: BP 134/78 (BP Location: Left Arm, Patient Position: Sitting, Cuff Size: Normal)   Pulse 80   Resp 16   Ht 5\' 5"  (1.651 m)   Wt 153 lb 6.4 oz (69.6 kg)   BMI 25.53 kg/m    Physical Exam Vitals and nursing note reviewed.  Constitutional:      Appearance: She is well-developed.  HENT:     Head: Normocephalic and atraumatic.  Eyes:     Conjunctiva/sclera: Conjunctivae normal.  Cardiovascular:     Rate and Rhythm: Normal rate and regular rhythm.     Heart sounds: Normal heart sounds.  Pulmonary:     Effort: Pulmonary effort is normal.     Breath sounds: Normal breath sounds.  Abdominal:     General: Bowel sounds are normal.     Palpations: Abdomen is soft.  Musculoskeletal:     Cervical back: Normal range of motion.  Lymphadenopathy:     Cervical: No cervical adenopathy.  Skin:    General: Skin is warm and dry.     Capillary Refill: Capillary refill takes less than 2 seconds.  Neurological:     Mental Status: She is alert and oriented to person, place, and time.  Psychiatric:        Behavior: Behavior normal.      Musculoskeletal Exam: Cervical, thoracic and lumbar spine 1 good range of motion.  Shoulder joints, elbow joints, wrist joints, MCPs PIPs and DIPs were in good range of motion without  synovitis.  Hip joints and knee joints in good range of motion without any warmth swelling or effusion.  There was no tenderness over ankles or MTPs.  No muscular weakness or tenderness was noted.  CDAI Exam: CDAI Score: -- Patient Global: --; Provider Global: -- Swollen: --; Tender: -- Joint Exam 03/18/2023   No joint exam has been documented for this visit   There is currently no information documented on the homunculus. Go to the Rheumatology activity and complete the homunculus joint exam.  Investigation: No additional findings.  Imaging: No results found.  Recent Labs: Lab Results  Component Value Date   WBC 15.1 (H) 01/28/2023   HGB 13.2 01/28/2023  PLT 160.0 01/28/2023   NA 137 11/25/2022   K 3.8 11/25/2022   CL 102 11/25/2022   CO2 28 11/25/2022   GLUCOSE 89 11/25/2022   BUN 24 (H) 11/25/2022   CREATININE 0.70 11/25/2022   BILITOT 0.6 01/28/2023   ALKPHOS 54 01/28/2023   AST 10 01/28/2023   ALT 6 01/28/2023   PROT 6.5 01/28/2023   ALBUMIN 4.1 01/28/2023   CALCIUM 9.6 11/25/2022   GFRAA 87 06/25/2020   QFTBGOLDPLUS NEGATIVE 07/07/2017     Speciality Comments: PLQ Eye Exam: 02/23/2022 WNL @ [facility]. Follow up 1 year.   Fosamax May 08, 2021  Procedures:  No procedures performed Allergies: Albuterol, Codeine, Flagyl [metronidazole hcl], Nexium [esomeprazole magnesium], Imuran [azathioprine], Sulfa antibiotics, and Sulfa drugs cross reactors   Assessment / Plan:     Visit Diagnoses: Dermatomyositis (HCC) - Jo 1+, Positive muscle biopsy for dermatomyositis: Patient denies any flares of dermatomyositis.  There is no history of recent rash or increased muscle weakness.  She had good muscle strength on the examination today.  She had no difficulty getting up from a squatting position except for some right knee joint discomfort.  High risk medication use - Cellcept 500 mg, 1 tablet p.o. twice daily, prednisone 1 to 2 mg p.o. daily.January 08, 2023 CBC WBC 6.6, hemoglobin 13.7, platelets 136, CMP creatinine 0.85, AST 11, ALT 7, vitamin D58.7, LDL 85.6, triglycerides 152, HDL 39.  Labs were reviewed with the patient.  She also had labs on January 28, 2023 at the time her white cell count was up due to infection.  CK was 42.  Information on immunization was placed in the AVS.  She was advised to have repeat labs in January.  Sjogren's syndrome with keratoconjunctivitis sicca (HCC)-she continues to have dry mouth and dry eye symptoms.  Over-the-counter products were discussed.  ILD (interstitial lung disease) (HCC) -she is followed by Dr. Isaiah Serge.  High-resolution CT on July 08, 2022 was suspicious of UIP pattern.  She has been under care of Dr. Isaiah Serge and has been on CellCept since April 2024.  Long term (current) use of systemic steroids - she has been tapering prednisone gradually.  She is currently taking 1.5 or 2 mg prednisone daily.  Primary osteoarthritis of both hands-she denies any discomfort in her hands today.  No synovitis was noted.  Chronic pain of right knee-she continues to have some discomfort in her right knee joint.  No warmth swelling or effusion was noted.  Primary osteoarthritis of both knees  Raynaud's disease without gangrene-raynauds phenomenon continues to be active especially during the winter months.  Keeping core temperature warm and warm clothing was discussed.  Anxiety-patient states her anxiety has improved since she has been on Zoloft.  She is reducing the dose gradually.  Age-related osteoporosis without current pathological fracture - Patient states that she had a repeat DEXA with a T score of -2.2.  Fosamax since May 08, 2021 prescribed by her PCP.  Vitamin D deficiency-vitamin D was normal in October.  Thymoma  Former smoker  Orders: No orders of the defined types were placed in this encounter.  No orders of the defined types were placed in this encounter.   Follow-Up Instructions:  Return in about 3 months (around 06/16/2023) for Dermatomyositis, ILD.   Pollyann Savoy, MD  Note - This record has been created using Animal nutritionist.  Chart creation errors have been sought, but may not always  have been located. Such creation errors do  not reflect on  the standard of medical care.

## 2023-03-09 DIAGNOSIS — H40053 Ocular hypertension, bilateral: Secondary | ICD-10-CM | POA: Diagnosis not present

## 2023-03-09 DIAGNOSIS — H2513 Age-related nuclear cataract, bilateral: Secondary | ICD-10-CM | POA: Diagnosis not present

## 2023-03-09 DIAGNOSIS — G43B0 Ophthalmoplegic migraine, not intractable: Secondary | ICD-10-CM | POA: Diagnosis not present

## 2023-03-09 DIAGNOSIS — H43812 Vitreous degeneration, left eye: Secondary | ICD-10-CM | POA: Diagnosis not present

## 2023-03-09 DIAGNOSIS — Z79899 Other long term (current) drug therapy: Secondary | ICD-10-CM | POA: Diagnosis not present

## 2023-03-09 MED ORDER — PREDNISONE 1 MG PO TABS: 2.0000 mg | ORAL_TABLET | Freq: Every day | ORAL | 0 refills | Status: DC

## 2023-03-09 NOTE — Telephone Encounter (Signed)
Last Fill: 02/04/2023  Next Visit: 03/18/2023  Last Visit: 12/16/2022  Dx:  Dermatomyositis   Current Dose per office note on 12/16/2022: prednisone 1 to 2 mg p.o. daily   Okay to refill Prednisone?

## 2023-03-17 ENCOUNTER — Encounter: Payer: Self-pay | Admitting: Rheumatology

## 2023-03-18 ENCOUNTER — Encounter: Payer: Self-pay | Admitting: Rheumatology

## 2023-03-18 ENCOUNTER — Ambulatory Visit: Payer: Medicare Other | Attending: Rheumatology | Admitting: Rheumatology

## 2023-03-18 VITALS — BP 134/78 | HR 80 | Resp 16 | Ht 65.0 in | Wt 153.4 lb

## 2023-03-18 DIAGNOSIS — J849 Interstitial pulmonary disease, unspecified: Secondary | ICD-10-CM | POA: Insufficient documentation

## 2023-03-18 DIAGNOSIS — G8929 Other chronic pain: Secondary | ICD-10-CM | POA: Insufficient documentation

## 2023-03-18 DIAGNOSIS — F419 Anxiety disorder, unspecified: Secondary | ICD-10-CM | POA: Diagnosis not present

## 2023-03-18 DIAGNOSIS — Z87891 Personal history of nicotine dependence: Secondary | ICD-10-CM | POA: Diagnosis not present

## 2023-03-18 DIAGNOSIS — I7300 Raynaud's syndrome without gangrene: Secondary | ICD-10-CM | POA: Diagnosis not present

## 2023-03-18 DIAGNOSIS — M25561 Pain in right knee: Secondary | ICD-10-CM | POA: Insufficient documentation

## 2023-03-18 DIAGNOSIS — Z79899 Other long term (current) drug therapy: Secondary | ICD-10-CM | POA: Insufficient documentation

## 2023-03-18 DIAGNOSIS — D4989 Neoplasm of unspecified behavior of other specified sites: Secondary | ICD-10-CM | POA: Insufficient documentation

## 2023-03-18 DIAGNOSIS — E559 Vitamin D deficiency, unspecified: Secondary | ICD-10-CM | POA: Insufficient documentation

## 2023-03-18 DIAGNOSIS — M3501 Sicca syndrome with keratoconjunctivitis: Secondary | ICD-10-CM | POA: Diagnosis not present

## 2023-03-18 DIAGNOSIS — Z7952 Long term (current) use of systemic steroids: Secondary | ICD-10-CM | POA: Insufficient documentation

## 2023-03-18 DIAGNOSIS — M170 Bilateral primary osteoarthritis of knee: Secondary | ICD-10-CM | POA: Insufficient documentation

## 2023-03-18 DIAGNOSIS — M3313 Other dermatomyositis without myopathy: Secondary | ICD-10-CM | POA: Diagnosis not present

## 2023-03-18 DIAGNOSIS — M19041 Primary osteoarthritis, right hand: Secondary | ICD-10-CM | POA: Diagnosis not present

## 2023-03-18 DIAGNOSIS — M19042 Primary osteoarthritis, left hand: Secondary | ICD-10-CM | POA: Diagnosis not present

## 2023-03-18 DIAGNOSIS — M810 Age-related osteoporosis without current pathological fracture: Secondary | ICD-10-CM | POA: Diagnosis not present

## 2023-03-18 NOTE — Patient Instructions (Signed)
Standing Labs We placed an order today for your standing lab work.   Please have your standing labs drawn in January and every 3 months  Please have your labs drawn 2 weeks prior to your appointment so that the provider can discuss your lab results at your appointment, if possible.  Please note that you may see your imaging and lab results in MyChart before we have reviewed them. We will contact you once all results are reviewed. Please allow our office up to 72 hours to thoroughly review all of the results before contacting the office for clarification of your results.  WALK-IN LAB HOURS  Monday through Thursday from 8:00 am -12:30 pm and 1:00 pm-5:00 pm and Friday from 8:00 am-12:00 pm.  Patients with office visits requiring labs will be seen before walk-in labs.  You may encounter longer than normal wait times. Please allow additional time. Wait times may be shorter on  Monday and Thursday afternoons.  We do not book appointments for walk-in labs. We appreciate your patience and understanding with our staff.   Labs are drawn by [facility]. Please bring your co-pay at the time of your lab draw.  You may receive a bill from [facility] for your lab work.  Please note if you are on Hydroxychloroquine and and an order has been placed for a Hydroxychloroquine level,  you will need to have it drawn 4 hours or more after your last dose.  If you wish to have your labs drawn at another location, please call the office 24 hours in advance so we can fax the orders.  The office is located at [address], [place], [place] [address]   If you have any questions regarding directions or hours of operation,  please call [phone].   As a reminder, please drink plenty of water prior to coming for your lab work. Thanks!   Vaccines You are taking a medication(s) that can suppress your immune system.  The following immunizations are recommended: Flu annually Covid-19  RSV Td/Tdap (tetanus,  diphtheria, pertussis) every 10 years Pneumonia (Prevnar 15 then Pneumovax 23 at least 1 year apart.  Alternatively, can take Prevnar 20 without needing additional dose) Shingrix: 2 doses from 4 weeks to 6 months apart  Please check with your PCP to make sure you are up to date.   If you have signs or symptoms of an infection or start antibiotics: First, call your PCP for workup of your infection. Hold your medication through the infection, until you complete your antibiotics, and until symptoms resolve if you take the following: Injectable medication (Actemra, Benlysta, Cimzia, Cosentyx, Enbrel, Humira, Kevzara, Orencia, Remicade, Simponi, Stelara, Taltz, Tremfya) Methotrexate Leflunomide (Arava) Mycophenolate (Cellcept) Harriette Ohara, Olumiant, or Rinvoq

## 2023-03-20 DIAGNOSIS — G479 Sleep disorder, unspecified: Secondary | ICD-10-CM | POA: Diagnosis not present

## 2023-03-20 DIAGNOSIS — G4700 Insomnia, unspecified: Secondary | ICD-10-CM | POA: Diagnosis not present

## 2023-03-20 DIAGNOSIS — G4733 Obstructive sleep apnea (adult) (pediatric): Secondary | ICD-10-CM | POA: Diagnosis not present

## 2023-03-22 DIAGNOSIS — G4700 Insomnia, unspecified: Secondary | ICD-10-CM | POA: Diagnosis not present

## 2023-03-22 DIAGNOSIS — G479 Sleep disorder, unspecified: Secondary | ICD-10-CM | POA: Diagnosis not present

## 2023-03-22 DIAGNOSIS — G4733 Obstructive sleep apnea (adult) (pediatric): Secondary | ICD-10-CM | POA: Diagnosis not present

## 2023-03-25 ENCOUNTER — Ambulatory Visit: Payer: Medicare Other | Admitting: Pulmonary Disease

## 2023-03-25 ENCOUNTER — Encounter: Payer: Self-pay | Admitting: Pulmonary Disease

## 2023-03-25 VITALS — BP 130/72 | HR 70 | Temp 97.7°F | Ht 65.5 in | Wt 154.4 lb

## 2023-03-25 DIAGNOSIS — Z5181 Encounter for therapeutic drug level monitoring: Secondary | ICD-10-CM | POA: Diagnosis not present

## 2023-03-25 DIAGNOSIS — J849 Interstitial pulmonary disease, unspecified: Secondary | ICD-10-CM

## 2023-03-25 NOTE — Progress Notes (Addendum)
 Marisa Orozco    [id number]    March 16, 1951  Primary Care Physician:Daniel, Leeanna Puff, MD  Referring Physician: Leesa Pulling, MD [address] [place],  [place] [address]  Chief complaint: Consult for interstitial lung disease  HPI: 72 y.o. 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   She has longstanding history of dermatomyositis, Sjogren's syndrome.  Diagnosed initially as a teenager.  She was treated with glucocorticoids at that time.  She had a flareup in 2017 and a repeat muscle biopsy.  She is currently under the care of Dr. Alvira Josephs and being treated with methotrexate , Plaquenil  and prednisone  with good control of symptoms.  Dr. Alvira Josephs noted some lung crackles on examination and sent her for evaluation of interstitial lung disease.  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  She was evaluated by Dr. 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  She has been followed radiographically.  States that her breathing is stable at present with no complaints of dyspnea, cough or chest congestion  Taken off methotrexate  and started on CellCept  500 mg twice daily.  CellCept  was increased to 1 g twice daily in May 2024  Pets: No pets Occupation: Works as a Data processing manager for a [employer] Exposures: She has feather pillows in her living room couch.  No mold, hot tub, Jacuzzi Smoking history: 25-pack-year smoker.  Quit in 2005 Travel history: Previously lived in [place] .  No significant recent travel Relevant family history: Mother has COPD.  She was a smoker.  Interim history: Discussed the use of AI  scribe software for clinical note transcription with the patient, who gave verbal consent to proceed.  The patient, with a history of dermatomyositis and Sjogren's syndrome, presents for a routine follow-up. She reports that she can always tell when her dermatomyositis is active due to muscle and joint discomfort, particularly in the hips. The symptoms come and go, but the patient notes that the condition doesn't seem to go away completely anymore, unlike when she was younger. She speculates that a filler injection she received five months prior to a significant flare may have triggered the resurgence of her condition.  The patient is currently on 2mg  of prednisone  and CellCept , having stopped Plaquenil  and methotrexate .  CellCept  dose was decreased to 500 mg twice daily in October for low platelet levels.  Subsequent CBC showed platelet count has recovered.  She reports feeling okay overall, but notes that she can always tell when her dermatomyositis is active. She has no issues with breathing and her interstitial lung disease appears stable on CT. Her platelet counts have recovered since reducing the CellCept  dosage.   Outpatient Encounter Medications as of 03/25/2023  Medication Sig   Acetaminophen (TYLENOL PO) Take by mouth as needed.   alendronate  (FOSAMAX ) 70 MG tablet Take 70 mg by mouth once a week.   Cholecalciferol (VITAMIN D3) 1000 UNIT/SPRAY LIQD Take 5 sprays by mouth daily.    diphenhydrAMINE (BENADRYL ALLERGY) 25 MG tablet Take 12.5 mg by mouth as needed.   hydroxychloroquine  (PLAQUENIL ) 200 MG tablet Take 1 tablet (200 mg total) by mouth daily. (Patient not taking: Reported on 03/18/2023)   Magnesium Glycinate POWD 400  mg by Does not apply route 2 (two) times daily.   MELATONIN PO Take 0.5-1 mg by mouth daily.   Multiple Vitamin (MULTIVITAMIN PO) Take by mouth daily.   mycophenolate  (CELLCEPT ) 500 MG tablet Take 1 tablet (500 mg total) by mouth 2 (two) times daily.   predniSONE   (DELTASONE ) 1 MG tablet Take 2 tablets (2 mg total) by mouth daily with breakfast.   sertraline (ZOLOFT) 50 MG tablet Take 25 mg by mouth daily. Taking 75mg  daily   vitamin C (ASCORBIC ACID) 250 MG tablet Take 250 mg by mouth daily.   VITAMIN K PO Take 1 tablet by mouth daily.    Wound Dressings (BIAFINE EX) as needed.   No facility-administered encounter medications on file as of 03/25/2023.   Physical Exam: Blood pressure 130/72, pulse 70, temperature 97.7 F (36.5 C), temperature source Oral, height 5' 5.5" (1.664 m), weight 154 lb 6.4 oz (70 kg), SpO2 100%. Gen:      No acute distress HEENT:  EOMI, sclera anicteric Neck:     No masses; no thyromegaly Lungs:    Clear to auscultation bilaterally; normal respiratory effort CV:         Regular rate and rhythm; no murmurs Abd:      + bowel sounds; soft, non-tender; no palpable masses, no distension Ext:    No edema; adequate peripheral perfusion Skin:      Warm and dry; no rash Neuro: alert and oriented x 3 Psych: normal mood and affect   Data Reviewed: Imaging: CT abdomen pelvis 01/30/2016- minimal basal reticulation. CT chest 09/30/2018-stable anterior mediastinal soft tissue density, mild basilar fibrotic changes CT abdomen pelvis 12/31/2020-increase in size of basal mild fibrotic changes. High resolution CT 07/08/2022-basilar predominant interstitial lung disease and probable UIP pattern.  Progression since 2020, stable anterior mediastinal soft tissue mass High resolution CT 02/03/2023-stable pattern of interstitial lung disease I have reviewed the images personally.  PFTs: 06/30/2022 FVC 2.48 [80%], FEV1 2.05 [88%], F/F33, TLC 4.57 [87%], DLCO 20.23 [100%] Normal test  Labs: CBC, CMP 06/11/2022-normal limits Hypersensitive panel 06/11/2022-negative  Assessment:  Evaluation for interstitial lung disease She has history of dermatomyositis and Sjogren's syndrome under good control with a regimen of methotrexate , Plaquenil  and  prednisone .  There is down pillows in her living room but exposure appears to be minimal.  She does have minimal crackles on examination.  Review of her lung imaging shows basal mild changes of reticulation back in 2017 which appears to have increased on the latest high resolution CT  Appearance does not appear to be consistent with hypersensitivity pneumonitis and serologies are negative I advised her to get rid of the down pillows in the living room to be on the safe side  Reviewed findings in detail with patient.  We have stopped methotrexate  and started CellCept  for better treatment of interstitial lung disease.  So far she is tolerating it well We will hold at current dose of 500 mg twice daily as platelet counts have recovered Recent CBC reviewed, CT scan reviewed with patient.  She prefers to avoid PFTs. She may need addition of antifibrotic therapy in the future if there is progression of pulmonary fibrosis  Thrombocytopenia Resolved after reducing CellCept  dose. -Monitor blood counts.  Follow-up -Check labs with Dr. Livia Riffle in January. -Return to clinic in 6 months unless labs are abnormal.   Plan/Recommendations: -Continue CellCept  - Check labs for monitoring - Follow-up in 6 months  Phyllis Breeze MD [facility] 03/25/2023, 2:31  PM  CC: Leesa Pulling, MD

## 2023-03-25 NOTE — Patient Instructions (Signed)
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 You mentioned that you can always tell when your dermatomyositis is active due to muscle and joint discomfort, especially in your hips. You also noted that the condition doesn't seem to go away completely anymore. You are currently on 2mg  of prednisone and CellCept, and you have stopped taking Plaquenil and methotrexate. You reported feeling okay overall, with no issues with breathing, and your interstitial lung disease appears stable on CT. Your platelet counts have recovered since reducing the CellCept dosage.  YOUR PLAN:  -DERMATOMYOSITIS: Dermatomyositis is an inflammatory disease marked by muscle weakness and a distinctive skin rash. Your condition is stable on your current regimen of 2mg  Prednisone and CellCept, although you experience intermittent muscle and joint symptoms. We will continue with your current medication regimen.  -INTERSTITIAL LUNG DISEASE: Interstitial lung disease refers to a group of disorders that cause scarring of lung tissue. Your condition is stable on imaging, and you have not reported any breathlessness. We will continue with your current regimen and defer lung function tests as long as your symptoms and imaging remain stable.  -THROMBOCYTOPENIA: Thrombocytopenia is a condition characterized by low platelet counts. Your platelet counts have resolved after reducing the CellCept dose. We will continue to monitor your blood counts.  INSTRUCTIONS:  Please check your labs with Dr. Corliss Skains in January. Return to the clinic in 6 months unless your labs are abnormal.

## 2023-03-30 DIAGNOSIS — M545 Low back pain, unspecified: Secondary | ICD-10-CM | POA: Diagnosis not present

## 2023-03-30 DIAGNOSIS — Z6824 Body mass index (BMI) 24.0-24.9, adult: Secondary | ICD-10-CM | POA: Diagnosis not present

## 2023-03-30 DIAGNOSIS — R030 Elevated blood-pressure reading, without diagnosis of hypertension: Secondary | ICD-10-CM | POA: Diagnosis not present

## 2023-04-06 ENCOUNTER — Other Ambulatory Visit: Payer: Self-pay | Admitting: Rheumatology

## 2023-04-06 MED ORDER — PREDNISONE 1 MG PO TABS: 2.0000 mg | ORAL_TABLET | Freq: Every day | ORAL | 2 refills | Status: DC

## 2023-04-06 NOTE — Telephone Encounter (Signed)
 Last Fill: 03/09/2023  Next Visit: 06/17/2023  Last Visit: 03/18/2023  Dx: Dermatomyositis   Current Dose per office note on 03/18/2023: prednisone 1 to 2 mg p.o. daily.   Okay to refill Prednisone?

## 2023-04-19 DIAGNOSIS — D2271 Melanocytic nevi of right lower limb, including hip: Secondary | ICD-10-CM | POA: Diagnosis not present

## 2023-04-19 DIAGNOSIS — L820 Inflamed seborrheic keratosis: Secondary | ICD-10-CM | POA: Diagnosis not present

## 2023-04-19 DIAGNOSIS — D2272 Melanocytic nevi of left lower limb, including hip: Secondary | ICD-10-CM | POA: Diagnosis not present

## 2023-04-19 DIAGNOSIS — D225 Melanocytic nevi of trunk: Secondary | ICD-10-CM | POA: Diagnosis not present

## 2023-04-19 DIAGNOSIS — L570 Actinic keratosis: Secondary | ICD-10-CM | POA: Diagnosis not present

## 2023-04-19 DIAGNOSIS — D2262 Melanocytic nevi of left upper limb, including shoulder: Secondary | ICD-10-CM | POA: Diagnosis not present

## 2023-04-19 DIAGNOSIS — D2261 Melanocytic nevi of right upper limb, including shoulder: Secondary | ICD-10-CM | POA: Diagnosis not present

## 2023-04-19 DIAGNOSIS — Z85828 Personal history of other malignant neoplasm of skin: Secondary | ICD-10-CM | POA: Diagnosis not present

## 2023-04-20 DIAGNOSIS — N816 Rectocele: Secondary | ICD-10-CM | POA: Diagnosis not present

## 2023-04-20 DIAGNOSIS — N3946 Mixed incontinence: Secondary | ICD-10-CM | POA: Diagnosis not present

## 2023-04-27 ENCOUNTER — Ambulatory Visit: Payer: Medicare Other | Admitting: Rheumatology

## 2023-05-07 ENCOUNTER — Other Ambulatory Visit: Payer: Self-pay | Admitting: Rheumatology

## 2023-05-07 DIAGNOSIS — K2100 Gastro-esophageal reflux disease with esophagitis, without bleeding: Secondary | ICD-10-CM | POA: Diagnosis not present

## 2023-05-07 DIAGNOSIS — I700 Atherosclerosis of aorta: Secondary | ICD-10-CM | POA: Diagnosis not present

## 2023-05-07 DIAGNOSIS — Z23 Encounter for immunization: Secondary | ICD-10-CM | POA: Diagnosis not present

## 2023-05-07 DIAGNOSIS — Z0001 Encounter for general adult medical examination with abnormal findings: Secondary | ICD-10-CM | POA: Diagnosis not present

## 2023-05-07 DIAGNOSIS — Z1389 Encounter for screening for other disorder: Secondary | ICD-10-CM | POA: Diagnosis not present

## 2023-05-07 DIAGNOSIS — Z6824 Body mass index (BMI) 24.0-24.9, adult: Secondary | ICD-10-CM | POA: Diagnosis not present

## 2023-05-07 DIAGNOSIS — M3501 Sicca syndrome with keratoconjunctivitis: Secondary | ICD-10-CM | POA: Diagnosis not present

## 2023-05-07 DIAGNOSIS — Z1331 Encounter for screening for depression: Secondary | ICD-10-CM | POA: Diagnosis not present

## 2023-05-08 ENCOUNTER — Encounter: Payer: Self-pay | Admitting: Rheumatology

## 2023-05-08 DIAGNOSIS — M3313 Other dermatomyositis without myopathy: Secondary | ICD-10-CM

## 2023-05-08 DIAGNOSIS — Z79899 Other long term (current) drug therapy: Secondary | ICD-10-CM

## 2023-05-10 ENCOUNTER — Other Ambulatory Visit: Payer: Self-pay | Admitting: *Deleted

## 2023-05-10 DIAGNOSIS — I7300 Raynaud's syndrome without gangrene: Secondary | ICD-10-CM

## 2023-05-10 DIAGNOSIS — M3501 Sicca syndrome with keratoconjunctivitis: Secondary | ICD-10-CM

## 2023-05-10 DIAGNOSIS — J849 Interstitial pulmonary disease, unspecified: Secondary | ICD-10-CM

## 2023-05-10 DIAGNOSIS — M3313 Other dermatomyositis without myopathy: Secondary | ICD-10-CM

## 2023-05-10 DIAGNOSIS — M810 Age-related osteoporosis without current pathological fracture: Secondary | ICD-10-CM

## 2023-05-10 DIAGNOSIS — Z79899 Other long term (current) drug therapy: Secondary | ICD-10-CM

## 2023-05-10 DIAGNOSIS — Z7952 Long term (current) use of systemic steroids: Secondary | ICD-10-CM

## 2023-05-26 DIAGNOSIS — N3946 Mixed incontinence: Secondary | ICD-10-CM | POA: Diagnosis not present

## 2023-05-26 DIAGNOSIS — N816 Rectocele: Secondary | ICD-10-CM | POA: Diagnosis not present

## 2023-06-04 DIAGNOSIS — I10 Essential (primary) hypertension: Secondary | ICD-10-CM | POA: Diagnosis not present

## 2023-06-04 DIAGNOSIS — E782 Mixed hyperlipidemia: Secondary | ICD-10-CM | POA: Diagnosis not present

## 2023-06-04 DIAGNOSIS — F331 Major depressive disorder, recurrent, moderate: Secondary | ICD-10-CM | POA: Diagnosis not present

## 2023-06-04 NOTE — Progress Notes (Deleted)
 Office Visit Note  Patient: Marisa Orozco             Date of Birth: 01-26-1951           MRN: [id number]             PCP: Richardean Chimera, MD Referring: Richardean Chimera, MD Visit Date: 06/17/2023 Occupation: @GUAROCC @  Subjective:  No chief complaint on file.   History of Present Illness: Marisa Orozco is a 73 y.o. female ***     Activities of Daily Living:  Patient reports morning stiffness for *** {minute/hour:19697}.   Patient {ACTIONS;DENIES/REPORTS:21021675::"Denies"} nocturnal pain.  Difficulty dressing/grooming: {ACTIONS;DENIES/REPORTS:21021675::"Denies"} Difficulty climbing stairs: {ACTIONS;DENIES/REPORTS:21021675::"Denies"} Difficulty getting out of chair: {ACTIONS;DENIES/REPORTS:21021675::"Denies"} Difficulty using hands for taps, buttons, cutlery, and/or writing: {ACTIONS;DENIES/REPORTS:21021675::"Denies"}  No Rheumatology ROS completed.   PMFS History:  Patient Active Problem List   Diagnosis Date Noted   ILD (interstitial lung disease) (HCC) 08/25/2022   Palpitations 01/15/2021   Hypothyroidism 07/26/2019   Unilateral primary osteoarthritis, right knee 02/03/2018   Primary osteoarthritis of both hands 09/08/2017   Sjogren's disease (HCC) 02/04/2016   Dermatomyositis (HCC) 02/04/2016   Former smoker 02/04/2016   Thymoma 02/04/2016   Fatigue 02/15/2012    Past Medical History:  Diagnosis Date   Allergy    Anxiety    Cancer (HCC)    basal cell carcinoma - face   Dermatomyositis (HCC) 02/04/2016   Jo 1 Positive  Skin Bx- Subtle interface Dermatitis    Former smoker 02/04/2016   GERD (gastroesophageal reflux disease)    otc med prn   PONV (postoperative nausea and vomiting)    Raynaud's disease    Sjogren's disease (HCC) 02/04/2016   Positive ANA, Positive Ro, Positive CCP, Negative RF, Parotitis, Positive Sicca   Sjogren's syndrome (HCC)    from MD note    Family History  Problem Relation Age of Onset   COPD Mother    Heart disease Mother     Diabetes Father    Kidney disease Father    Epilepsy Daughter    Rectal cancer Maternal Uncle    Esophageal cancer Neg Hx    Stomach cancer Neg Hx    Ulcerative colitis Neg Hx    Past Surgical History:  Procedure Laterality Date   BREAST SURGERY     augmentation   BUNIONECTOMY     right foot   COLONOSCOPY     DIAGNOSTIC LAPAROSCOPY     DILATION AND CURETTAGE OF UTERUS  04/15/2011   Procedure: DILATATION AND CURETTAGE;  Surgeon: Levi Aland;  Location: [facility];  Service: Gynecology;  Laterality: N/A;   IR RADIOLOGIST EVAL & MGMT  02/04/2021   MUSCLE BIOPSY Left 01/02/2016   Procedure: MUSCLE BIOPSY LEFT DELTOID;  Surgeon: Avel Peace, MD;  Location: [facility];  Service: General;  Laterality: Left;  left deltoid muscle bx   NASAL SEPTUM SURGERY     SVD     x 2   TONSILLECTOMY     WISDOM TOOTH EXTRACTION     Social History   Social History Narrative   Not on file   Immunization History  Administered Date(s) Administered   Influenza, High Dose Seasonal PF 01/07/2018   Influenza-Unspecified 01/04/2014   PFIZER(Purple Top)SARS-COV-2 Vaccination 04/26/2019, 05/17/2019, 12/12/2019   Pneumococcal Conjugate-13 01/05/2016   Zoster Recombinant(Shingrix) 08/11/2017, 12/17/2017     Objective: Vital Signs: There were no vitals taken for this visit.   Physical Exam   Musculoskeletal Exam: ***  CDAI  Exam: CDAI Score: -- Patient Global: --; Provider Global: -- Swollen: --; Tender: -- Joint Exam 06/17/2023   No joint exam has been documented for this visit   There is currently no information documented on the homunculus. Go to the Rheumatology activity and complete the homunculus joint exam.  Investigation: No additional findings.  Imaging: No results found.  Recent Labs: Lab Results  Component Value Date   WBC 15.1 (H) 01/28/2023   HGB 13.2 01/28/2023   PLT 160.0 01/28/2023   NA 137 11/25/2022   K 3.8 11/25/2022   CL 102 11/25/2022   CO2 28  11/25/2022   GLUCOSE 89 11/25/2022   BUN 24 (H) 11/25/2022   CREATININE 0.70 11/25/2022   BILITOT 0.6 01/28/2023   ALKPHOS 54 01/28/2023   AST 10 01/28/2023   ALT 6 01/28/2023   PROT 6.5 01/28/2023   ALBUMIN 4.1 01/28/2023   CALCIUM 9.6 11/25/2022   GFRAA 87 06/25/2020   QFTBGOLDPLUS NEGATIVE 07/07/2017    Speciality Comments: PLQ Eye Exam: 02/23/2022 [facility] @ [facility]. Follow up 1 year.   Fosamax May 08, 2021  Procedures:  No procedures performed Allergies: Albuterol, Codeine, Flagyl [metronidazole hcl], Nexium [esomeprazole magnesium], Imuran [azathioprine], Sulfa antibiotics, and Sulfa drugs cross reactors   Assessment / Plan:     Visit Diagnoses: No diagnosis found.  Orders: No orders of the defined types were placed in this encounter.  No orders of the defined types were placed in this encounter.   Face-to-face time spent with patient was *** minutes. Greater than 50% of time was spent in counseling and coordination of care.  Follow-Up Instructions: No follow-ups on file.   Ellen Henri, CMA  Note - This record has been created using Animal nutritionist.  Chart creation errors have been sought, but may not always  have been located. Such creation errors do not reflect on  the standard of medical care.

## 2023-06-15 ENCOUNTER — Encounter: Payer: Self-pay | Admitting: Rheumatology

## 2023-06-15 ENCOUNTER — Ambulatory Visit: Attending: Rheumatology | Admitting: Rheumatology

## 2023-06-15 VITALS — BP 108/68 | HR 76 | Resp 14 | Ht 66.0 in | Wt 156.0 lb

## 2023-06-15 DIAGNOSIS — M170 Bilateral primary osteoarthritis of knee: Secondary | ICD-10-CM | POA: Diagnosis not present

## 2023-06-15 DIAGNOSIS — I7300 Raynaud's syndrome without gangrene: Secondary | ICD-10-CM

## 2023-06-15 DIAGNOSIS — D4989 Neoplasm of unspecified behavior of other specified sites: Secondary | ICD-10-CM

## 2023-06-15 DIAGNOSIS — M19042 Primary osteoarthritis, left hand: Secondary | ICD-10-CM

## 2023-06-15 DIAGNOSIS — J849 Interstitial pulmonary disease, unspecified: Secondary | ICD-10-CM | POA: Diagnosis not present

## 2023-06-15 DIAGNOSIS — M19041 Primary osteoarthritis, right hand: Secondary | ICD-10-CM

## 2023-06-15 DIAGNOSIS — F419 Anxiety disorder, unspecified: Secondary | ICD-10-CM | POA: Diagnosis not present

## 2023-06-15 DIAGNOSIS — Z79899 Other long term (current) drug therapy: Secondary | ICD-10-CM

## 2023-06-15 DIAGNOSIS — M3501 Sicca syndrome with keratoconjunctivitis: Secondary | ICD-10-CM

## 2023-06-15 DIAGNOSIS — E559 Vitamin D deficiency, unspecified: Secondary | ICD-10-CM

## 2023-06-15 DIAGNOSIS — Z7952 Long term (current) use of systemic steroids: Secondary | ICD-10-CM | POA: Diagnosis not present

## 2023-06-15 DIAGNOSIS — M3313 Other dermatomyositis without myopathy: Secondary | ICD-10-CM | POA: Diagnosis not present

## 2023-06-15 DIAGNOSIS — M810 Age-related osteoporosis without current pathological fracture: Secondary | ICD-10-CM | POA: Diagnosis not present

## 2023-06-15 DIAGNOSIS — Z87891 Personal history of nicotine dependence: Secondary | ICD-10-CM | POA: Diagnosis not present

## 2023-06-15 LAB — COMPLETE METABOLIC PANEL WITH GFR
AG Ratio: 1.7 (calc) (ref 1.0–2.5)
ALT: 15 U/L (ref 6–29)
AST: 22 U/L (ref 10–35)
Albumin: 4.5 g/dL (ref 3.6–5.1)
Alkaline phosphatase (APISO): 60 U/L (ref 37–153)
BUN: 24 mg/dL (ref 7–25)
CO2: 29 mmol/L (ref 20–32)
Calcium: 9.2 mg/dL (ref 8.6–10.4)
Chloride: 102 mmol/L (ref 98–110)
Creat: 0.66 mg/dL (ref 0.60–1.00)
Globulin: 2.6 g/dL (ref 1.9–3.7)
Glucose, Bld: 102 mg/dL — ABNORMAL HIGH (ref 65–99)
Potassium: 4.1 mmol/L (ref 3.5–5.3)
Sodium: 139 mmol/L (ref 135–146)
Total Bilirubin: 0.5 mg/dL (ref 0.2–1.2)
Total Protein: 7.1 g/dL (ref 6.1–8.1)
eGFR: 93 mL/min/{1.73_m2} (ref >=60)

## 2023-06-15 LAB — CBC WITH DIFFERENTIAL/PLATELET
Absolute Lymphocytes: 1736 {cells}/uL (ref 850–3900)
Absolute Monocytes: 747 {cells}/uL (ref 200–950)
Basophils Absolute: 39 {cells}/uL (ref 0–200)
Basophils Relative: 0.4 %
Eosinophils Absolute: 136 {cells}/uL (ref 15–500)
Eosinophils Relative: 1.4 %
HCT: 43.9 % (ref 35.0–45.0)
Hemoglobin: 14.3 g/dL (ref 11.7–15.5)
MCH: 28.7 pg (ref 27.0–33.0)
MCHC: 32.6 g/dL (ref 32.0–36.0)
MCV: 88.2 fL (ref 80.0–100.0)
MPV: 12.1 fL (ref 7.5–12.5)
Monocytes Relative: 7.7 %
Neutro Abs: 7042 {cells}/uL (ref 1500–7800)
Neutrophils Relative %: 72.6 %
Platelets: 154 10*3/uL (ref 140–400)
RBC: 4.98 10*6/uL (ref 3.80–5.10)
RDW: 13.4 % (ref 11.0–15.0)
Total Lymphocyte: 17.9 %
WBC: 9.7 10*3/uL (ref 3.8–10.8)

## 2023-06-15 LAB — CK: Total CK: 93 U/L (ref 18–225)

## 2023-06-15 NOTE — Progress Notes (Signed)
 Office Visit Note  Patient: Marisa Orozco             Date of Birth: Oct 05, 1950           MRN: [id number]             PCP: Richardean Chimera, MD Referring: Richardean Chimera, MD Visit Date: 06/15/2023 Occupation: @GUAROCC @  Subjective:  Medication management  History of Present Illness: Marisa Orozco is a 73 y.o. female with dermatomyositis, Sjogren's, ILD, osteoarthritis and osteoporosis.  She returns today after her last visit in December 2024.  She denies any increased shortness of breath or muscular weakness.  She continues to have dry mouth and dry eyes.  She has been taking CellCept 500 mg p.o. twice daily and prednisone p.o. daily.  She remains on Fosamax 70 mg p.o. daily which she started in February 2023.  He was evaluated by Dr. Isaiah Serge on March 25, 2023 he advised to continue CellCept.    Activities of Daily Living:  Patient reports morning stiffness for 0 minute.   Patient Denies nocturnal pain.  Difficulty dressing/grooming: Denies Difficulty climbing stairs: Reports Difficulty getting out of chair: Denies Difficulty using hands for taps, buttons, cutlery, and/or writing: Denies  Review of Systems  Constitutional:  Negative for fatigue.  HENT:  Positive for mouth sores and mouth dryness.   Eyes:  Positive for dryness.  Respiratory:  Negative for shortness of breath.   Cardiovascular:  Positive for palpitations. Negative for chest pain.  Gastrointestinal:  Negative for blood in stool, constipation and diarrhea.  Endocrine: Negative for increased urination.  Genitourinary:  Positive for involuntary urination.  Musculoskeletal:  Positive for joint pain, joint pain, myalgias, muscle weakness, muscle tenderness and myalgias. Negative for gait problem, joint swelling and morning stiffness.  Skin:  Positive for sensitivity to sunlight. Negative for color change, rash and hair loss.  Allergic/Immunologic: Negative for susceptible to infections.  Neurological:  Negative  for dizziness and headaches.  Hematological:  Negative for swollen glands.  Psychiatric/Behavioral:  Positive for sleep disturbance. Negative for depressed mood. The patient is not nervous/anxious.     PMFS History:  Patient Active Problem List   Diagnosis Date Noted   ILD (interstitial lung disease) (HCC) 08/25/2022   Palpitations 01/15/2021   Hypothyroidism 07/26/2019   Unilateral primary osteoarthritis, right knee 02/03/2018   Primary osteoarthritis of both hands 09/08/2017   Sjogren's disease (HCC) 02/04/2016   Dermatomyositis (HCC) 02/04/2016   Former smoker 02/04/2016   Thymoma 02/04/2016   Fatigue 02/15/2012    Past Medical History:  Diagnosis Date   Allergy    Anxiety    Cancer (HCC)    basal cell carcinoma - face   Dermatomyositis (HCC) 02/04/2016   Jo 1 Positive  Skin Bx- Subtle interface Dermatitis    Former smoker 02/04/2016   GERD (gastroesophageal reflux disease)    otc med prn   PONV (postoperative nausea and vomiting)    Raynaud's disease    Sjogren's disease (HCC) 02/04/2016   Positive ANA, Positive Ro, Positive CCP, Negative RF, Parotitis, Positive Sicca   Sjogren's syndrome (HCC)    from MD note    Family History  Problem Relation Age of Onset   COPD Mother    Heart disease Mother    Diabetes Father    Kidney disease Father    Epilepsy Daughter    Rectal cancer Maternal Uncle    Esophageal cancer Neg Hx    Stomach cancer Neg Hx  Ulcerative colitis Neg Hx    Past Surgical History:  Procedure Laterality Date   BREAST SURGERY     augmentation   BUNIONECTOMY     right foot   COLONOSCOPY     DIAGNOSTIC LAPAROSCOPY     DILATION AND CURETTAGE OF UTERUS  04/15/2011   Procedure: DILATATION AND CURETTAGE;  Surgeon: Levi Aland;  Location: [facility];  Service: Gynecology;  Laterality: N/A;   IR RADIOLOGIST EVAL & MGMT  02/04/2021   MUSCLE BIOPSY Left 01/02/2016   Procedure: MUSCLE BIOPSY LEFT DELTOID;  Surgeon: Avel Peace, MD;  Location: [facility]  [place];  Service: General;  Laterality: Left;  left deltoid muscle bx   NASAL SEPTUM SURGERY     SVD     x 2   TONSILLECTOMY     WISDOM TOOTH EXTRACTION     Social History   Social History Narrative   Not on file   Immunization History  Administered Date(s) Administered   Influenza, High Dose Seasonal PF 01/07/2018   Influenza-Unspecified 01/04/2014   PFIZER(Purple Top)SARS-COV-2 Vaccination 04/26/2019, 05/17/2019, 12/12/2019   Pneumococcal Conjugate-13 01/05/2016   Zoster Recombinant(Shingrix) 08/11/2017, 12/17/2017     Objective: Vital Signs: BP 108/68 (BP Location: Left Arm, Patient Position: Sitting, Cuff Size: Large)   Pulse 76   Resp 14   Ht 5\' 6"  (1.676 m)   Wt 156 lb (70.8 kg)   BMI 25.18 kg/m    Physical Exam Vitals and nursing note reviewed.  Constitutional:      Appearance: She is well-developed.  HENT:     Head: Normocephalic and atraumatic.  Eyes:     Conjunctiva/sclera: Conjunctivae normal.  Cardiovascular:     Rate and Rhythm: Normal rate and regular rhythm.     Heart sounds: Normal heart sounds.  Pulmonary:     Effort: Pulmonary effort is normal.     Breath sounds: Normal breath sounds.  Abdominal:     General: Bowel sounds are normal.     Palpations: Abdomen is soft.  Musculoskeletal:     Cervical back: Normal range of motion.  Lymphadenopathy:     Cervical: No cervical adenopathy.  Skin:    General: Skin is warm and dry.     Capillary Refill: Capillary refill takes less than 2 seconds.     Comments: Crackles were audible in the bilateral lung bases.  Neurological:     Mental Status: She is alert and oriented to person, place, and time.  Psychiatric:        Behavior: Behavior normal.      Musculoskeletal Exam: Cervical, thoracic and lumbar spine were in good range of motion.  Shoulders, elbows, wrist joints, MCPs PIPs and DIPs were in good range of motion with no synovitis.  Hip joints, knee joints, ankles and MTPs were in  good range of motion with no synovitis.  No muscular weakness or tenderness was noted.  She had no difficulty getting up from the squatting position.  CDAI Exam: CDAI Score: -- Patient Global: --; Provider Global: -- Swollen: --; Tender: -- Joint Exam 06/15/2023   No joint exam has been documented for this visit   There is currently no information documented on the homunculus. Go to the Rheumatology activity and complete the homunculus joint exam.  Investigation: No additional findings.  Imaging: No results found.  Recent Labs: Lab Results  Component Value Date   WBC 15.1 (H) 01/28/2023   HGB 13.2 01/28/2023   PLT 160.0 01/28/2023   NA 137  11/25/2022   K 3.8 11/25/2022   CL 102 11/25/2022   CO2 28 11/25/2022   GLUCOSE 89 11/25/2022   BUN 24 (H) 11/25/2022   CREATININE 0.70 11/25/2022   BILITOT 0.6 01/28/2023   ALKPHOS 54 01/28/2023   AST 10 01/28/2023   ALT 6 01/28/2023   PROT 6.5 01/28/2023   ALBUMIN 4.1 01/28/2023   CALCIUM 9.6 11/25/2022   GFRAA 87 06/25/2020   QFTBGOLDPLUS NEGATIVE 07/07/2017   January 28, 2023 CK 42     Speciality Comments: PLQ Eye Exam: 02/23/2022 [facility]NL @ [facility]. Follow up 1 year.   Fosamax May 08, 2021  Procedures:  No procedures performed Allergies: Albuterol, Codeine, Flagyl [metronidazole hcl], Nexium [esomeprazole magnesium], Imuran [azathioprine], Sulfa antibiotics, and Sulfa drugs cross reactors   Assessment / Plan:     Visit Diagnoses: Dermatomyositis (HCC) - Jo 1+, Positive muscle biopsy for dermatomyositis: -Patient denies any increased muscular weakness or tenderness.  She had no difficulty getting up from the squatting position.  Her last CK was 42 on January 28, 2023.  Will check CK today.  Patient believes that she had some lab work and December with her PCP.  She will forward the results to [place].  Plan: CK  High risk medication use - Cellcept 500 mg, 1 tablet p.o. twice daily, prednisone 1  mg p.o.  daily. -Mid-October CBC and CMP were stable except white cell count was elevated due to being on prednisone.  Will check labs today.  Plan: CBC with Differential/Platelet, COMPLETE METABOLIC PANEL WITH GFR  Sjogren's syndrome with keratoconjunctivitis sicca (HCC)-she continues to have dry mouth and dry eyes.  Over-the-counter products were discussed.  ILD (interstitial lung disease) (HCC) -she had crackles in bilateral lung bases. IMPRESSION: 1. Spectrum of findings compatible with basilar predominant fibrotic interstitial lung disease without frank honeycombing, without appreciable interval progression since 07/08/2022 high-resolution chest CT study, although with clear progression from 09/30/2018 chest CT as previously detailed. 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 Am Rosezetta Schlatter Crit Care Med Vol 198, Iss 5, [phone], Dec 05 2016. 2. One vessel coronary atherosclerosis. 3. Small hiatal hernia. 4.  Aortic Atherosclerosis (ICD10-I70.0).  Electronically Signed   By: Delbert Phenix M.D.   On: 02/18/2023 15:36 PFTs: 06/30/2022 FVC 2.48 [80%], FEV1 2.05 [88%], F/F33, TLC 4.57 [87%], DLCO 20.23 [100%] Normal test  She has been under care of Dr. Isaiah Serge and has been on CellCept since April 2024.  Long term (current) use of systemic steroids - she has been tapering prednisone gradually.  She is currently taking 1 mg prednisone daily.  Primary osteoarthritis of both hands-she continues to have some stiffness in her hands.  No synovitis was noted.  Primary osteoarthritis of both knees-she denies any discomfort today.  Raynaud's disease without gangrene-she is to have Raynaud's symptoms.  She has been using hand warmers.  Age-related osteoporosis without current pathological fracture - Patient states that she had a repeat DEXA with a T score of -2.2.  Fosamax since  May 08, 2021 prescribed by her PCP.  Will repeat DEXA scan in 2025.  Vitamin D deficiency-she takes vitamin D supplement.  Anxiety  Thymoma  Former smoker  Orders: Orders Placed This Encounter  Procedures   CBC with Differential/Platelet   COMPLETE METABOLIC PANEL WITH GFR   CK   No orders of the defined types were placed in this encounter.   Follow-Up Instructions: Return  in about 3 months (around 09/15/2023) for Dermatomyositis, ILD, Sjogren's.   Pollyann Savoy, MD  Note - This record has been created using Animal nutritionist.  Chart creation errors have been sought, but may not always  have been located. Such creation errors do not reflect on  the standard of medical care.

## 2023-06-15 NOTE — Patient Instructions (Signed)
Standing Labs We placed an order today for your standing lab work.   Please have your standing labs drawn in June and every 3 months  Please have your labs drawn 2 weeks prior to your appointment so that the provider can discuss your lab results at your appointment, if possible.  Please note that you may see your imaging and lab results in [facility] before we have reviewed them. We will contact you once all results are reviewed. Please allow our office up to 72 hours to thoroughly review all of the results before contacting the office for clarification of your results.  WALK-IN LAB HOURS  Monday through Thursday from 8:00 am -12:30 pm and 1:00 pm-5:00 pm and Friday from 8:00 am-12:00 pm.  Patients with office visits requiring labs will be seen before walk-in labs.  You may encounter longer than normal wait times. Please allow additional time. Wait times may be shorter on  Monday and Thursday afternoons.  We do not book appointments for walk-in labs. We appreciate your patience and understanding with our staff.   Labs are drawn by [facility]. Please bring your co-pay at the time of your lab draw.  You may receive a bill from [facility] for your lab work.  Please note if you are on Hydroxychloroquine and and an order has been placed for a Hydroxychloroquine level,  you will need to have it drawn 4 hours or more after your last dose.  If you wish to have your labs drawn at another location, please call the office 24 hours in advance so we can fax the orders.  The office is located at [address], [place], [place], [place] [address]   If you have any questions regarding directions or hours of operation,  please call [phone].   As a reminder, please drink plenty of water prior to coming for your lab work. Thanks!  Vaccines You are taking a medication(s) that can suppress your immune system.  The following immunizations are recommended: Flu annually Covid-19  RSV Td/Tdap (tetanus,  diphtheria, pertussis) every 10 years Pneumonia (Prevnar 15 then Pneumovax 23 at least 1 year apart.  Alternatively, can take Prevnar 20 without needing additional dose) Shingrix: 2 doses from 4 weeks to 6 months apart  Please check with your PCP to make sure you are up to date.   If you have signs or symptoms of an infection or start antibiotics: First, call your PCP for workup of your infection. Hold your medication through the infection, until you complete your antibiotics, and until symptoms resolve if you take the following: Injectable medication (Actemra, Benlysta, Cimzia, Cosentyx, Enbrel, Humira, Kevzara, Orencia, Remicade, Simponi, Stelara, Taltz, Tremfya) Methotrexate Leflunomide (Arava) Mycophenolate (Cellcept) Morrie Sheldon, Olumiant, or Rinvoq

## 2023-06-16 NOTE — Progress Notes (Signed)
 CBC was normal, CMP is normal except glucose is mildly elevated at 102 as it is not a fasting sample and CK is within normal limits.

## 2023-06-17 ENCOUNTER — Ambulatory Visit: Payer: Medicare Other | Admitting: Rheumatology

## 2023-06-17 DIAGNOSIS — J849 Interstitial pulmonary disease, unspecified: Secondary | ICD-10-CM

## 2023-06-17 DIAGNOSIS — M19041 Primary osteoarthritis, right hand: Secondary | ICD-10-CM

## 2023-06-17 DIAGNOSIS — D4989 Neoplasm of unspecified behavior of other specified sites: Secondary | ICD-10-CM

## 2023-06-17 DIAGNOSIS — Z79899 Other long term (current) drug therapy: Secondary | ICD-10-CM

## 2023-06-17 DIAGNOSIS — F419 Anxiety disorder, unspecified: Secondary | ICD-10-CM

## 2023-06-17 DIAGNOSIS — M170 Bilateral primary osteoarthritis of knee: Secondary | ICD-10-CM

## 2023-06-17 DIAGNOSIS — I7300 Raynaud's syndrome without gangrene: Secondary | ICD-10-CM

## 2023-06-17 DIAGNOSIS — M3501 Sicca syndrome with keratoconjunctivitis: Secondary | ICD-10-CM

## 2023-06-17 DIAGNOSIS — M810 Age-related osteoporosis without current pathological fracture: Secondary | ICD-10-CM

## 2023-06-17 DIAGNOSIS — Z7952 Long term (current) use of systemic steroids: Secondary | ICD-10-CM

## 2023-06-17 DIAGNOSIS — M3313 Other dermatomyositis without myopathy: Secondary | ICD-10-CM

## 2023-06-17 DIAGNOSIS — Z87891 Personal history of nicotine dependence: Secondary | ICD-10-CM

## 2023-06-17 DIAGNOSIS — E559 Vitamin D deficiency, unspecified: Secondary | ICD-10-CM

## 2023-06-17 DIAGNOSIS — G8929 Other chronic pain: Secondary | ICD-10-CM

## 2023-07-05 DIAGNOSIS — F331 Major depressive disorder, recurrent, moderate: Secondary | ICD-10-CM | POA: Diagnosis not present

## 2023-07-05 DIAGNOSIS — I10 Essential (primary) hypertension: Secondary | ICD-10-CM | POA: Diagnosis not present

## 2023-07-05 DIAGNOSIS — E782 Mixed hyperlipidemia: Secondary | ICD-10-CM | POA: Diagnosis not present

## 2023-07-24 ENCOUNTER — Other Ambulatory Visit: Payer: Self-pay | Admitting: Physician Assistant

## 2023-07-26 NOTE — Telephone Encounter (Signed)
 Please clarify if she is taking 1 mg or 2 mg daily?

## 2023-07-26 NOTE — Telephone Encounter (Signed)
 Last Fill: 04/06/2023  Next Visit: 09/06/2023  Last Visit: 06/15/2023  Dx: Dermatomyositis   Current Dose per office note on 06/15/2023: prednisone  1 mg p.o. daily.   Okay to refill Prednisone ?

## 2023-07-26 NOTE — Telephone Encounter (Signed)
 Patient contacted the office and left a message advising she is taking 2 tablets 1 mg each daily.

## 2023-07-26 NOTE — Telephone Encounter (Signed)
 Attempted to contact the patient and left message for patient to call the office to clarify dose.

## 2023-08-02 ENCOUNTER — Other Ambulatory Visit: Payer: Self-pay | Admitting: *Deleted

## 2023-08-02 ENCOUNTER — Telehealth: Payer: Self-pay | Admitting: *Deleted

## 2023-08-02 ENCOUNTER — Encounter: Payer: Self-pay | Admitting: Rheumatology

## 2023-08-02 DIAGNOSIS — M3313 Other dermatomyositis without myopathy: Secondary | ICD-10-CM

## 2023-08-02 DIAGNOSIS — M3501 Sicca syndrome with keratoconjunctivitis: Secondary | ICD-10-CM

## 2023-08-02 NOTE — Addendum Note (Signed)
 Addended by: Adrianne Horn on: 08/02/2023 08:57 AM   Modules accepted: Orders

## 2023-08-02 NOTE — Telephone Encounter (Signed)
 Patient advised she may come in for sed rate, RF, anti-CCP and CK. Future orders placed and patient states she will come by the office today.

## 2023-08-02 NOTE — Telephone Encounter (Signed)
 Patient may come in for sed rate, RF, anti-CCP and CK.

## 2023-08-02 NOTE — Telephone Encounter (Signed)
 Patient contacted the office and states she thinks she may be in a flare or starting of a flare. Patient states she is having tenderness in her ringers  and her right wrist. Patient states for the last couple of weeks she has been having discomfort in her lower back as well as turning in the bed. She states she has soreness in the left side of neck, her upper shoulders hip and thighs. Patient states she would like to have labs checked. Please advise.

## 2023-08-03 ENCOUNTER — Telehealth: Payer: Self-pay | Admitting: Rheumatology

## 2023-08-03 MED ORDER — PREDNISONE 5 MG PO TABS: ORAL_TABLET | ORAL | 0 refills | Status: DC

## 2023-08-03 NOTE — Telephone Encounter (Signed)
 I returned patient's call.  Patient wants to start on prednisone  as she is very concerned that she might be facing a flare.  She states she does not want to go into a flare of dermatomyositis.  She requested a prednisone  taper.  Side effects of prednisone  were discussed.  Will send a prescription for prednisone  starting at 10 mg and taper by 2.5 mg every week.

## 2023-08-03 NOTE — Telephone Encounter (Signed)
 Labs obtained yesterday showed CK1 36, sed rate 2, rheumatoid factor negative, anti-CCP pending.  Labs do not indicate a flare.  If the rash gets worse she should see her PCP.

## 2023-08-03 NOTE — Telephone Encounter (Signed)
 Patient called stating she sent Dr. Alvira Josephs a mychart message, but would also like to speak with her directly regarding her labwork.

## 2023-08-04 ENCOUNTER — Encounter: Payer: Self-pay | Admitting: Pulmonary Disease

## 2023-08-04 DIAGNOSIS — F331 Major depressive disorder, recurrent, moderate: Secondary | ICD-10-CM | POA: Diagnosis not present

## 2023-08-04 DIAGNOSIS — E782 Mixed hyperlipidemia: Secondary | ICD-10-CM | POA: Diagnosis not present

## 2023-08-04 DIAGNOSIS — I10 Essential (primary) hypertension: Secondary | ICD-10-CM | POA: Diagnosis not present

## 2023-08-04 LAB — RHEUMATOID FACTOR: Rheumatoid fact SerPl-aCnc: <10 [IU]/mL (ref <14)

## 2023-08-04 LAB — CK: Total CK: 136 U/L (ref 18–225)

## 2023-08-04 LAB — SEDIMENTATION RATE: Sed Rate: 2 mm/h (ref 0–30)

## 2023-08-04 LAB — CYCLIC CITRUL PEPTIDE ANTIBODY, IGG: Cyclic Citrullin Peptide Ab: 153 U — ABNORMAL HIGH

## 2023-08-05 NOTE — Progress Notes (Signed)
 Anti-CCP is a nonspecific test but usually seen with rheumatoid arthritis.  It is not usually associated with Sjogren's.

## 2023-08-05 NOTE — Progress Notes (Signed)
 Anti-CCP positive, CK normal, rheumatoid factor negative, sed rate 2.  Anti-CCP antibody could be associated with rheumatoid arthritis and could be nonspecific.

## 2023-08-09 NOTE — Telephone Encounter (Signed)
**Note De-identified  [place] Obfuscation** Please advise 

## 2023-08-09 NOTE — Telephone Encounter (Signed)
 I called patient over the telephone but it went to voicemail.  Left message to call back

## 2023-08-23 NOTE — Progress Notes (Signed)
 Office Visit Note  Patient: Marisa Orozco             Date of Birth: 10-Dec-1950           MRN: [id number]             PCP: Leesa Pulling, MD Referring: Leesa Pulling, MD Visit Date: 09/06/2023 Occupation: @GUAROCC @  Subjective:  Fatigue and pain  History of Present Illness: Marisa Orozco is a 73 y.o. female with dermatomyositis, Sjogren's, ILD, osteoarthritis and osteoporosis.  She returns today after her last visit in May 2025.  She states in the first week of April she started experiencing increased fatigue.  She was also experiencing some discomfort in her shoulders.  Recently she had some discomfort in her right wrist.  At the time labs were performed and sed rate was normal CK was also within normal limits.  Anti-CCP remains positive like before and rheumatoid factor was negative.  Patient was concerned that she might be having a flare of dermatomyositis due to increased pain.  As she requested to go on the higher dose of prednisone .  Dose of prednisone  was increased to prednisone  10 mg p.o. daily for 7 days and then taper by 2.5 mg every week.  She is on her baseline dose of 2 mg a day now.  She states she continues to have some fatigue and also some discomfort in her right wrist.  She also feels some discomfort when she sleeps on her side in her shoulders.  Is not experiencing any increased shortness of breath.  She continues to take CellCept  500 mg twice a day on a regular basis.  She is also on Fosamax  70 mg p.o. weekly.  She has been taking calcium and vitamin D .  She is not exercising.    Activities of Daily Living:  Patient reports morning stiffness for a few minutes.   Patient Reports nocturnal pain.  Difficulty dressing/grooming: Denies Difficulty climbing stairs: Reports Difficulty getting out of chair: Denies Difficulty using hands for taps, buttons, cutlery, and/or writing: Denies  Review of Systems  Constitutional:  Positive for fatigue.  HENT:  Positive for  mouth dryness. Negative for mouth sores.   Eyes:  Positive for dryness.  Respiratory:  Negative for shortness of breath.   Cardiovascular:  Negative for chest pain and palpitations.  Gastrointestinal:  Negative for blood in stool, constipation and diarrhea.  Endocrine: Negative for increased urination.  Genitourinary:  Negative for involuntary urination.  Musculoskeletal:  Positive for joint pain, joint pain, joint swelling, myalgias, muscle weakness, morning stiffness, muscle tenderness and myalgias. Negative for gait problem.  Skin:  Positive for color change. Negative for rash, hair loss and sensitivity to sunlight.  Allergic/Immunologic: Negative for susceptible to infections.  Neurological:  Negative for dizziness and headaches.  Hematological:  Negative for swollen glands.  Psychiatric/Behavioral:  Negative for depressed mood and sleep disturbance. The patient is not nervous/anxious.     PMFS History:  Patient Active Problem List   Diagnosis Date Noted   ILD (interstitial lung disease) (HCC) 08/25/2022   Palpitations 01/15/2021   Hypothyroidism 07/26/2019   Unilateral primary osteoarthritis, right knee 02/03/2018   Primary osteoarthritis of both hands 09/08/2017   Sjogren's disease (HCC) 02/04/2016   Dermatomyositis (HCC) 02/04/2016   Former smoker 02/04/2016   Thymoma 02/04/2016   Fatigue 02/15/2012    Past Medical History:  Diagnosis Date   Allergy    Anxiety    Cancer (HCC)    basal  cell carcinoma - face   Dermatomyositis (HCC) 02/04/2016   Jo 1 Positive  Skin Bx- Subtle interface Dermatitis    Former smoker 02/04/2016   GERD (gastroesophageal reflux disease)    otc med prn   PONV (postoperative nausea and vomiting)    Raynaud's disease    Sjogren's disease (HCC) 02/04/2016   Positive ANA, Positive Ro, Positive CCP, Negative RF, Parotitis, Positive Sicca   Sjogren's syndrome (HCC)    from MD note    Family History  Problem Relation Age of Onset   COPD Mother     Heart disease Mother    Diabetes Father    Kidney disease Father    Epilepsy Daughter    Rectal cancer Maternal Uncle    Esophageal cancer Neg Hx    Stomach cancer Neg Hx    Ulcerative colitis Neg Hx    Past Surgical History:  Procedure Laterality Date   BREAST SURGERY     augmentation   BUNIONECTOMY     right foot   COLONOSCOPY     DIAGNOSTIC LAPAROSCOPY     DILATION AND CURETTAGE OF UTERUS  04/15/2011   Procedure: DILATATION AND CURETTAGE;  Surgeon: Hamp Levine;  Location: [facility];  Service: Gynecology;  Laterality: N/A;   IR RADIOLOGIST EVAL & MGMT  02/04/2021   MUSCLE BIOPSY Left 01/02/2016   Procedure: MUSCLE BIOPSY LEFT DELTOID;  Surgeon: Adalberto Hollow, MD;  Location: [facility];  Service: General;  Laterality: Left;  left deltoid muscle bx   NASAL SEPTUM SURGERY     SVD     x 2   TONSILLECTOMY     WISDOM TOOTH EXTRACTION     Social History   Social History Narrative   Not on file   Immunization History  Administered Date(s) Administered   Influenza, High Dose Seasonal PF 01/07/2018   Influenza-Unspecified 01/04/2014   PFIZER(Purple Top)SARS-COV-2 Vaccination 04/26/2019, 05/17/2019, 12/12/2019   Pneumococcal Conjugate-13 01/05/2016   Zoster Recombinant(Shingrix) 08/11/2017, 12/17/2017     Objective: Vital Signs: BP 126/79 (BP Location: Left Arm, Patient Position: Sitting, Cuff Size: Normal)   Pulse 76   Ht 5\' 6"  (1.676 m)   Wt 159 lb 3.2 oz (72.2 kg)   BMI 25.70 kg/m    Physical Exam Vitals and nursing note reviewed.  Constitutional:      Appearance: She is well-developed.  HENT:     Head: Normocephalic and atraumatic.  Eyes:     Conjunctiva/sclera: Conjunctivae normal.  Cardiovascular:     Rate and Rhythm: Normal rate and regular rhythm.     Heart sounds: Normal heart sounds.  Pulmonary:     Effort: Pulmonary effort is normal.     Breath sounds: Normal breath sounds.     Comments: Crackles in the lung bases. Abdominal:      General: Bowel sounds are normal.     Palpations: Abdomen is soft.  Musculoskeletal:     Cervical back: Normal range of motion.  Lymphadenopathy:     Cervical: No cervical adenopathy.  Skin:    General: Skin is warm and dry.     Capillary Refill: Capillary refill takes 2 to 3 seconds.  Neurological:     Mental Status: She is alert and oriented to person, place, and time.  Psychiatric:        Behavior: Behavior normal.      Musculoskeletal Exam: Cervical, thoracic and lumbar spine with good range of motion.  Shoulders, elbows, wrist joints, MCPs PIPs and DIPs with  good range of motion.  She had bilateral PIP and DIP thickening with no synovitis.  Hip joints and knee joints with good range of motion.  She had warmth on palpation of her right knee joint.  Ankles were in good range of motion.  Right foot bunion was noted.  No synovitis was noted.  CDAI Exam: CDAI Score: -- Patient Global: --; Provider Global: -- Swollen: --; Tender: -- Joint Exam 09/06/2023   No joint exam has been documented for this visit   There is currently no information documented on the homunculus. Go to the Rheumatology activity and complete the homunculus joint exam.  Investigation: No additional findings.  Imaging: No results found.  Recent Labs: Lab Results  Component Value Date   WBC 9.7 06/15/2023   HGB 14.3 06/15/2023   PLT 154 06/15/2023   NA 139 06/15/2023   K 4.1 06/15/2023   CL 102 06/15/2023   CO2 29 06/15/2023   GLUCOSE 102 (H) 06/15/2023   BUN 24 06/15/2023   CREATININE 0.66 06/15/2023   BILITOT 0.5 06/15/2023   ALKPHOS 54 01/28/2023   AST 22 06/15/2023   ALT 15 06/15/2023   PROT 7.1 06/15/2023   ALBUMIN 4.1 01/28/2023   CALCIUM 9.2 06/15/2023   GFRAA 87 06/25/2020   QFTBGOLDPLUS NEGATIVE 07/07/2017    08/02/23 CK 136, RF <10,Anti-CCP 153, ESR 2  Speciality Comments: PLQ Eye Exam: 02/23/2022 WNL @ [facility]. Follow up 1 year.   Fosamax  May 08, 2021  Procedures:  Large Joint Inj: R knee on 09/06/2023 8:53 AM Indications: pain Details: 27 G 1.5 in needle, medial approach  Arthrogram: No  Medications: 40 mg triamcinolone  acetonide 40 MG/ML; 1.5 mL lidocaine  1 % Aspirate: 0 mL Outcome: tolerated well, no immediate complications  Risk of infection, tendon injury, nerve injury, dermal atrophy and hypopigmentation were discussed. Procedure, treatment alternatives, risks and benefits explained, specific risks discussed. Consent was given by the patient. Immediately prior to procedure a time out was called to verify the correct patient, procedure, equipment, support staff and site/side marked as required. Patient was prepped and draped in the usual sterile fashion.     Allergies: Albuterol, Codeine, Flagyl [metronidazole hcl], Nexium [esomeprazole magnesium], Imuran  [azathioprine ], Sulfa antibiotics, and Sulfa drugs cross reactors   Assessment / Plan:     Visit Diagnoses: Dermatomyositis (HCC) - Jo 1+, Positive muscle biopsy for dermatomyositis: Patient states that she has been experiencing increased fatigue since the first week of April.  She was concerned that she might be having a dermatomyositis flare in April.  At the time CK was 136 and sed rate was 2.  As she was also experiencing arthralgias we obtain labs and her anti-CCP remains positive which was positive initially.  She requested increasing prednisone  to 10 mg p.o. daily.  She was given a prednisone  taper by 2.5 mg every week.  She back to her baseline prednisone  of 2 mg a day.  She remains on CellCept  500 mg twice daily without any interruption.  No muscular weakness or tenderness was noted on the examination today.  She still continues to have fatigue.  She is uncertain if it is due to several responsibilities in her work and family life.  Sjogren's syndrome with keratoconjunctivitis sicca (HCC)-she continues to have dry mouth and dry eye symptoms.  Rheumatoid factor has been  negative.  High risk medication use - Cellcept  500 mg, 1 tablet p.o. twice daily, prednisone  1  mg p.o. daily.  CBC and CMP  were normal on June 15, 2023.  ILD (interstitial lung disease) (HCC) - PFTs:3/26/2024FVC 2.48 [80%], FEV1 2.05 [88%], F/F33, TLC 4.57 [87%], DLCO 20.23 [100%]Normal test she was eval by Dr. Waylan Haggard on March 25, 2023.  He recommended to continue on CellCept .  She was advised to schedule a follow-up appointment with Dr. Waylan Haggard.  Long term (current) use of systemic steroids - she remains on prednisone  2 mg p.o. daily.  Positive anti-CCP test-her anti-CCP was 153 on August 02, 2023.  Her CCP was positive in the past greater than 250 on November 07, 2015.  Primary osteoarthritis of both hands-lateral PIP and DIP thickening with no synovitis was noted.  Primary osteoarthritis of both knees-she continues to have discomfort in her right knee joint.  Her x-rays from October 2023 were reviewed which showed severe osteoarthritis.  She had some warmth and inflammation in her knee joint today.  Patient states her right knee joint interferes with her mobility.  Different treatment options were discussed and after informed consent was obtained, side effects were discussed right knee joint was injected with lidocaine  and Kenalog  as described above.  Patient tolerated the procedure well.  I will also refer to physical therapy.  Raynaud's disease without gangrene-she had decreased capillary fill.  She denies any history of digital ulcers.  Age-related osteoporosis without current pathological fracture - Patient states that she had a repeat DEXA with a T score of -2.2.  Fosamax  since May 08, 2021 prescribed by her PCP.  She is on calcium and vitamin D .  Need for resistive exercises and daily walking was discussed.  Vitamin D  deficiency-she is on vitamin D  5000 units daily.  Her vitamin D  and DEXA scan are monitored by her PCP.  Muscle deconditioning-she states she is not very active and is  not able to exercise much.  She feels her muscles are weak all over.  She had good muscle strength in all 4 extremities.  I will refer her to physical therapy.  Anxiety-she is on Zoloft.  Thymoma  Former smoker  Orders: Orders Placed This Encounter  Procedures   Large Joint Inj: R knee   Ambulatory referral to Physical Therapy   No orders of the defined types were placed in this encounter.   Face-to-face time spent with patient was over 30 minutes.  Minutes. Greater than 50% of time was spent in counseling and coordination of care.  Follow-Up Instructions: Return in about 3 months (around 12/07/2023) for DM.   Nicholas Bari, MD  Note - This record has been created using Animal nutritionist.  Chart creation errors have been sought, but may not always  have been located. Such creation errors do not reflect on  the standard of medical care.

## 2023-08-28 ENCOUNTER — Other Ambulatory Visit: Payer: Self-pay | Admitting: Rheumatology

## 2023-09-03 DIAGNOSIS — I10 Essential (primary) hypertension: Secondary | ICD-10-CM | POA: Diagnosis not present

## 2023-09-03 DIAGNOSIS — E782 Mixed hyperlipidemia: Secondary | ICD-10-CM | POA: Diagnosis not present

## 2023-09-03 DIAGNOSIS — F331 Major depressive disorder, recurrent, moderate: Secondary | ICD-10-CM | POA: Diagnosis not present

## 2023-09-06 ENCOUNTER — Ambulatory Visit: Attending: Rheumatology | Admitting: Rheumatology

## 2023-09-06 ENCOUNTER — Encounter: Payer: Self-pay | Admitting: Rheumatology

## 2023-09-06 VITALS — BP 126/79 | HR 76 | Ht 66.0 in | Wt 159.2 lb

## 2023-09-06 DIAGNOSIS — Z79899 Other long term (current) drug therapy: Secondary | ICD-10-CM | POA: Insufficient documentation

## 2023-09-06 DIAGNOSIS — Z7952 Long term (current) use of systemic steroids: Secondary | ICD-10-CM | POA: Diagnosis not present

## 2023-09-06 DIAGNOSIS — M19041 Primary osteoarthritis, right hand: Secondary | ICD-10-CM | POA: Diagnosis not present

## 2023-09-06 DIAGNOSIS — E559 Vitamin D deficiency, unspecified: Secondary | ICD-10-CM | POA: Insufficient documentation

## 2023-09-06 DIAGNOSIS — M19042 Primary osteoarthritis, left hand: Secondary | ICD-10-CM | POA: Diagnosis not present

## 2023-09-06 DIAGNOSIS — M810 Age-related osteoporosis without current pathological fracture: Secondary | ICD-10-CM | POA: Insufficient documentation

## 2023-09-06 DIAGNOSIS — M3501 Sicca syndrome with keratoconjunctivitis: Secondary | ICD-10-CM | POA: Insufficient documentation

## 2023-09-06 DIAGNOSIS — F419 Anxiety disorder, unspecified: Secondary | ICD-10-CM | POA: Diagnosis not present

## 2023-09-06 DIAGNOSIS — J849 Interstitial pulmonary disease, unspecified: Secondary | ICD-10-CM | POA: Insufficient documentation

## 2023-09-06 DIAGNOSIS — M170 Bilateral primary osteoarthritis of knee: Secondary | ICD-10-CM | POA: Insufficient documentation

## 2023-09-06 DIAGNOSIS — I7300 Raynaud's syndrome without gangrene: Secondary | ICD-10-CM | POA: Diagnosis not present

## 2023-09-06 DIAGNOSIS — R29898 Other symptoms and signs involving the musculoskeletal system: Secondary | ICD-10-CM | POA: Insufficient documentation

## 2023-09-06 DIAGNOSIS — D4989 Neoplasm of unspecified behavior of other specified sites: Secondary | ICD-10-CM | POA: Insufficient documentation

## 2023-09-06 DIAGNOSIS — R768 Other specified abnormal immunological findings in serum: Secondary | ICD-10-CM | POA: Insufficient documentation

## 2023-09-06 DIAGNOSIS — Z87891 Personal history of nicotine dependence: Secondary | ICD-10-CM | POA: Insufficient documentation

## 2023-09-06 DIAGNOSIS — M3313 Other dermatomyositis without myopathy: Secondary | ICD-10-CM | POA: Insufficient documentation

## 2023-09-06 MED ORDER — TRIAMCINOLONE ACETONIDE 40 MG/ML IJ SUSP
40.0000 mg | INTRAMUSCULAR | Status: AC | PRN
  Administered 2023-09-06: 40 mg via INTRA_ARTICULAR

## 2023-09-06 MED ORDER — LIDOCAINE HCL 1 % IJ SOLN
1.5000 mL | INTRAMUSCULAR | Status: AC | PRN
  Administered 2023-09-06: 1.5 mL

## 2023-09-06 NOTE — Patient Instructions (Addendum)
 Please get CBC with differential and CMP with GFR with your next labs.  Standing Labs We placed an order today for your standing lab work.   Please have your standing labs drawn in September and every 3 months  Please have your labs drawn 2 weeks prior to your appointment so that the provider can discuss your lab results at your appointment, if possible.  Please note that you may see your imaging and lab results in MyChart before we have reviewed them. We will contact you once all results are reviewed. Please allow our office up to 72 hours to thoroughly review all of the results before contacting the office for clarification of your results.  WALK-IN LAB HOURS  Monday through Thursday from 8:00 am -12:30 pm and 1:00 pm-4:00 pm and Friday from 8:00 am-12:00 pm.  Patients with office visits requiring labs will be seen before walk-in labs.  You may encounter longer than normal wait times. Please allow additional time. Wait times may be shorter on  Monday and Thursday afternoons.  We do not book appointments for walk-in labs. We appreciate your patience and understanding with our staff.   Labs are drawn by [facility]. Please bring your co-pay at the time of your lab draw.  You may receive a bill from [facility] for your lab work.  Please note if you are on Hydroxychloroquine  and and an order has been placed for a Hydroxychloroquine  level,  you will need to have it drawn 4 hours or more after your last dose.  If you wish to have your labs drawn at another location, please call the office 24 hours in advance so we can fax the orders.  The office is located at [address], [address], [place], [place] [address]   If you have any questions regarding directions or hours of operation,  please call [phone].   As a reminder, please drink plenty of water prior to coming for your lab work. Thanks!   Vaccines You are taking a medication(s) that can suppress your immune system.  The following  immunizations are recommended: Flu annually Covid-19  Td/Tdap (tetanus, diphtheria, pertussis) every 10 years Pneumonia (Prevnar 15 then Pneumovax 23 at least 1 year apart.  Alternatively, can take Prevnar 20 without needing additional dose) Shingrix: 2 doses from 4 weeks to 6 months apart  Please check with your PCP to make sure you are up to date.

## 2023-10-04 DIAGNOSIS — E782 Mixed hyperlipidemia: Secondary | ICD-10-CM | POA: Diagnosis not present

## 2023-10-04 DIAGNOSIS — I10 Essential (primary) hypertension: Secondary | ICD-10-CM | POA: Diagnosis not present

## 2023-10-04 DIAGNOSIS — F331 Major depressive disorder, recurrent, moderate: Secondary | ICD-10-CM | POA: Diagnosis not present

## 2023-11-04 ENCOUNTER — Other Ambulatory Visit: Payer: Self-pay | Admitting: Physician Assistant

## 2023-11-04 DIAGNOSIS — F331 Major depressive disorder, recurrent, moderate: Secondary | ICD-10-CM | POA: Diagnosis not present

## 2023-11-04 DIAGNOSIS — I10 Essential (primary) hypertension: Secondary | ICD-10-CM | POA: Diagnosis not present

## 2023-11-04 DIAGNOSIS — E782 Mixed hyperlipidemia: Secondary | ICD-10-CM | POA: Diagnosis not present

## 2023-11-04 NOTE — Telephone Encounter (Signed)
 Last Fill: 07/26/2023  Next Visit: 01/05/2024  Last Visit: 09/06/2023  Dx: Dermatomyositis   Current Dose per office note on 09/06/2023: prednisone  of 2 mg a day   Okay to refill Prednisone ?

## 2023-11-24 ENCOUNTER — Telehealth: Payer: Self-pay | Admitting: Pulmonary Disease

## 2023-11-24 NOTE — Telephone Encounter (Signed)
 Refill request received for CellCept .   Dose: 500mg  BID  Last OV: 03/25/23, with 6 month follow-up recommended  Provider: Dr. Theophilus Pertinent labs: CBC/CMP due now   Next OV: overdue (due May 2025)   Routing to scheduling team for follow-up on appt scheduling. Will refill Cellcept  after appointment with Dr. Theophilus. Patient reports she has plenty of supply on hand.   Aleck Puls, PharmD, BCPS Clinical Pharmacist  [facility]

## 2023-11-24 NOTE — Telephone Encounter (Signed)
 PT has been scheduled.

## 2023-11-24 NOTE — Telephone Encounter (Signed)
 Addition to above -   Patient reports she is having labs drawn (CMP, CBC) this Friday (8/22) with PCP. She states she will have labs forwarded to our office.   Aleck Puls, PharmD, BCPS Clinical Pharmacist  [facility]

## 2023-11-26 ENCOUNTER — Encounter: Payer: Self-pay | Admitting: Pulmonary Disease

## 2023-11-26 ENCOUNTER — Encounter: Payer: Self-pay | Admitting: Rheumatology

## 2023-11-26 DIAGNOSIS — E039 Hypothyroidism, unspecified: Secondary | ICD-10-CM | POA: Diagnosis not present

## 2023-11-26 DIAGNOSIS — I10 Essential (primary) hypertension: Secondary | ICD-10-CM | POA: Diagnosis not present

## 2023-11-26 DIAGNOSIS — D559 Anemia due to enzyme disorder, unspecified: Secondary | ICD-10-CM | POA: Diagnosis not present

## 2023-11-26 DIAGNOSIS — E7849 Other hyperlipidemia: Secondary | ICD-10-CM | POA: Diagnosis not present

## 2023-11-26 DIAGNOSIS — Z1321 Encounter for screening for nutritional disorder: Secondary | ICD-10-CM | POA: Diagnosis not present

## 2023-11-29 ENCOUNTER — Telehealth: Payer: Self-pay

## 2023-11-29 NOTE — Telephone Encounter (Signed)
 I reviewed the labs.  Please scanned the lab results in the chart.

## 2023-11-29 NOTE — Telephone Encounter (Signed)
 Please review the attached labs from the patient. CBC, CMET and Lipid.   Thanks!

## 2023-11-29 NOTE — Telephone Encounter (Signed)
 fyi

## 2023-11-29 NOTE — Telephone Encounter (Signed)
 Labs received from: Dr. Toribio (PCP)- patient sent via MyChart.  Drawn on: 11/25/2023  Reviewed by: Dr. Dolphus   Labs drawn: CBC, CMET, Lipid panel, TSH, Vitamin D   Results:  Platelets 136 HDL Cholesterol 30.0 LDL_C 107.2  Labs are scanned under Media tab dated 11/26/2023 from patient message.

## 2023-12-02 DIAGNOSIS — F331 Major depressive disorder, recurrent, moderate: Secondary | ICD-10-CM | POA: Diagnosis not present

## 2023-12-02 DIAGNOSIS — I10 Essential (primary) hypertension: Secondary | ICD-10-CM | POA: Diagnosis not present

## 2023-12-02 DIAGNOSIS — E782 Mixed hyperlipidemia: Secondary | ICD-10-CM | POA: Diagnosis not present

## 2023-12-03 DIAGNOSIS — F331 Major depressive disorder, recurrent, moderate: Secondary | ICD-10-CM | POA: Diagnosis not present

## 2023-12-03 DIAGNOSIS — E782 Mixed hyperlipidemia: Secondary | ICD-10-CM | POA: Diagnosis not present

## 2023-12-03 DIAGNOSIS — Z6825 Body mass index (BMI) 25.0-25.9, adult: Secondary | ICD-10-CM | POA: Diagnosis not present

## 2023-12-03 DIAGNOSIS — M810 Age-related osteoporosis without current pathological fracture: Secondary | ICD-10-CM | POA: Diagnosis not present

## 2023-12-03 DIAGNOSIS — I10 Essential (primary) hypertension: Secondary | ICD-10-CM | POA: Diagnosis not present

## 2023-12-23 NOTE — Progress Notes (Signed)
 Office Visit Note  Patient: Marisa Orozco             Date of Birth: Jan 12, 1951           MRN: [id number]             PCP: Toribio Jerel MATSU, MD Referring: Toribio Jerel MATSU, MD Visit Date: 01/05/2024 Occupation: Data Unavailable  Subjective:  Pain in both knees  History of Present Illness: Marisa Orozco is a 73 y.o. female with dermatomyositis, Sjogren's, ILD, osteoarthritis and osteoporosis.  She returns today after her last visit in June 2025.  She states she had some response to right knee joint cortisone injection but the pain has recurred.  She would like to have repeat cortisone injection to both knees today.  She states she has been having discomfort in her bilateral knee joints when she is walking.  She will consider viscosupplement injections in the future.  She is not ready for total knee replacement yet.  She has been having some discomfort in her bilateral shoulders and the muscles.  She continues to have dry mouth but denies dry eye symptoms.  She she denies any shortness of breath.  She was recently evaluated by Dr. Theophilus.  High-resolution CT is pending.  She continues to be on CellCept  500 mg 1 tablet p.o. twice daily and prednisone  2 mg p.o. daily.  She continues to have mild Raynaud's symptoms.  She has noticed some decreased grip strength in her hands.  She is on Fosamax  for osteoporosis prescribed by her PCP.  She has been taking vitamin D  on a regular basis.  She has been under a lot of stress recently as her daughter fell and had a femur fracture.    Activities of Daily Living:  Patient reports morning stiffness for 0 minute.   Patient Reports nocturnal pain.  Difficulty dressing/grooming: Denies Difficulty climbing stairs: Reports Difficulty getting out of chair: Denies Difficulty using hands for taps, buttons, cutlery, and/or writing: Reports  Review of Systems  Constitutional:  Negative for fatigue.  HENT:  Positive for mouth sores and mouth dryness.   Eyes:   Negative for dryness.  Respiratory:  Negative for shortness of breath.   Cardiovascular:  Negative for chest pain and palpitations.  Gastrointestinal:  Negative for blood in stool, constipation and diarrhea.  Endocrine: Negative for increased urination.  Genitourinary:  Negative for involuntary urination.  Musculoskeletal:  Positive for joint pain, joint pain, myalgias, muscle weakness, muscle tenderness and myalgias. Negative for gait problem, joint swelling and morning stiffness.  Skin:  Negative for color change, rash, hair loss and sensitivity to sunlight.  Allergic/Immunologic: Negative for susceptible to infections.  Neurological:  Positive for headaches. Negative for dizziness.  Hematological:  Negative for swollen glands.  Psychiatric/Behavioral:  Negative for depressed mood and sleep disturbance. The patient is not nervous/anxious.     PMFS History:  Patient Active Problem List   Diagnosis Date Noted   ILD (interstitial lung disease) (HCC) 08/25/2022   Palpitations 01/15/2021   Hypothyroidism 07/26/2019   Unilateral primary osteoarthritis, right knee 02/03/2018   Primary osteoarthritis of both hands 09/08/2017   Sjogren's disease 02/04/2016   Dermatomyositis (HCC) 02/04/2016   Former smoker 02/04/2016   Thymoma 02/04/2016   Fatigue 02/15/2012    Past Medical History:  Diagnosis Date   Allergy    Anxiety    Cancer (HCC)    basal cell carcinoma - face   Dermatomyositis (HCC) 02/04/2016   Jo 1 Positive  Skin Bx- Subtle interface Dermatitis    Former smoker 02/04/2016   GERD (gastroesophageal reflux disease)    otc med prn   PONV (postoperative nausea and vomiting)    Raynaud's disease    Sjogren's disease 02/04/2016   Positive ANA, Positive Ro, Positive CCP, Negative RF, Parotitis, Positive Sicca   Sjogren's syndrome    from MD note    Family History  Problem Relation Age of Onset   COPD Mother    Heart disease Mother    Diabetes Father    Kidney disease  Father    Epilepsy Daughter    Rectal cancer Maternal Uncle    Esophageal cancer Neg Hx    Stomach cancer Neg Hx    Ulcerative colitis Neg Hx    Past Surgical History:  Procedure Laterality Date   BREAST SURGERY     augmentation   BUNIONECTOMY     right foot   COLONOSCOPY     DIAGNOSTIC LAPAROSCOPY     DILATION AND CURETTAGE OF UTERUS  04/15/2011   Procedure: DILATATION AND CURETTAGE;  Surgeon: Oneil FORBES Piety;  Location: [facility];  Service: Gynecology;  Laterality: N/A;   IR RADIOLOGIST EVAL & MGMT  02/04/2021   MUSCLE BIOPSY Left 01/02/2016   Procedure: MUSCLE BIOPSY LEFT DELTOID;  Surgeon: Krystal Russell, MD;  Location: [facility];  Service: General;  Laterality: Left;  left deltoid muscle bx   NASAL SEPTUM SURGERY     SVD     x 2   TONSILLECTOMY     WISDOM TOOTH EXTRACTION     Social History   Tobacco Use   Smoking status: Former    Current packs/day: 0.00    Average packs/day: 1 pack/day for 25.0 years (25.0 ttl pk-yrs)    Types: Cigarettes    Start date: 08/05/1978    Quit date: 08/05/2003    Years since quitting: 20.4    Passive exposure: Never   Smokeless tobacco: Never  Vaping Use   Vaping status: Never Used  Substance Use Topics   Alcohol  use: Yes    Comment: rarely - mixed drink   Drug use: No   Social History   Social History Narrative   Not on file     Immunization History  Administered Date(s) Administered   INFLUENZA, HIGH DOSE SEASONAL PF 01/07/2018   Influenza-Unspecified 01/04/2014   PFIZER(Purple Top)SARS-COV-2 Vaccination 04/26/2019, 05/17/2019, 12/12/2019   Pneumococcal Conjugate-13 01/05/2016   Zoster Recombinant(Shingrix) 08/11/2017, 12/17/2017     Objective: Vital Signs: BP 124/77   Pulse 78   Temp 98.2 F (36.8 C)   Resp 14   Ht 5' 6 (1.676 m)   Wt 157 lb 9.6 oz (71.5 kg)   BMI 25.44 kg/m    Physical Exam Vitals and nursing note reviewed.  Constitutional:      Appearance: She is well-developed.  HENT:      Head: Normocephalic and atraumatic.  Eyes:     Conjunctiva/sclera: Conjunctivae normal.  Cardiovascular:     Rate and Rhythm: Normal rate and regular rhythm.     Heart sounds: Normal heart sounds.  Pulmonary:     Effort: Pulmonary effort is normal.     Breath sounds: Normal breath sounds.     Comments: Crackles audible in bilateral lung bases. Abdominal:     General: Bowel sounds are normal.     Palpations: Abdomen is soft.  Musculoskeletal:     Cervical back: Normal range of motion.  Lymphadenopathy:  Cervical: No cervical adenopathy.  Skin:    General: Skin is warm and dry.     Capillary Refill: Capillary refill takes less than 2 seconds.  Neurological:     Mental Status: She is alert and oriented to person, place, and time.  Psychiatric:        Behavior: Behavior normal.      Musculoskeletal Exam: Cervical, thoracic and lumbar spine were in good range of motion.  There was no SI joint tenderness.  Shoulder joints, elbow joints, wrist joints, MCPs, PIPs and DIPs were in good range of motion with no synovitis.  Hip joints and knee joints were in good range of motion without any warmth swelling .  Small effusion was palpable in the right knee joint.  There was no tenderness over ankles or MTPs.  Bilateral bunions were noted.  CDAI Exam: CDAI Score: -- Patient Global: --; Provider Global: -- Swollen: --; Tender: -- Joint Exam 01/05/2024   No joint exam has been documented for this visit   There is currently no information documented on the homunculus. Go to the Rheumatology activity and complete the homunculus joint exam.  Investigation: No additional findings.  Imaging: No results found.  Recent Labs: Lab Results  Component Value Date   WBC 9.7 06/15/2023   HGB 14.3 06/15/2023   PLT 154 06/15/2023   NA 139 06/15/2023   K 4.1 06/15/2023   CL 102 06/15/2023   CO2 29 06/15/2023   GLUCOSE 102 (H) 06/15/2023   BUN 24 06/15/2023   CREATININE 0.66 06/15/2023    BILITOT 0.5 06/15/2023   ALKPHOS 54 01/28/2023   AST 22 06/15/2023   ALT 15 06/15/2023   PROT 7.1 06/15/2023   ALBUMIN 4.1 01/28/2023   CALCIUM 9.2 06/15/2023   GFRAA 87 06/25/2020   QFTBGOLDPLUS NEGATIVE 07/07/2017    Speciality Comments: PLQ Eye Exam: 02/23/2022 WNL @ [facility]. Follow up 1 year.   Fosamax  May 08, 2021  Procedures:  Large Joint Inj: bilateral knee on 01/05/2024 12:28 PM Indications: pain Details: 27 G 1.5 in needle, medial approach  Arthrogram: No  Medications (Right): 1.5 mL lidocaine  1 %; 40 mg triamcinolone  acetonide 40 MG/ML Aspirate (Right): 0 mL Medications (Left): 1.5 mL lidocaine  1 %; 40 mg triamcinolone  acetonide 40 MG/ML Aspirate (Left): 0 mL Outcome: tolerated well, no immediate complications  Risk of infection, tendon injury, nerve injury, hypopigmentation and dermal atrophy were discussed. Procedure, treatment alternatives, risks and benefits explained, specific risks discussed. Consent was given by the patient. Immediately prior to procedure a time out was called to verify the correct patient, procedure, equipment, support staff and site/side marked as required. Patient was prepped and draped in the usual sterile fashion.     Allergies: Albuterol, Codeine, Flagyl [metronidazole hcl], Nexium [esomeprazole magnesium], Imuran  [azathioprine ], Sulfa antibiotics, and Sulfa drugs cross reactors   Assessment / Plan:     Visit Diagnoses: Dermatomyositis (HCC) - Jo 1+, Positive muscle biopsy for dermatomyositis: She denies any increased muscular weakness or tenderness.  CK was 136 on August 02, 2023.  Will recheck CK with her next labs in November.  Sjogren's syndrome with keratoconjunctivitis sicca-he continues to have dry mouth symptoms.  Over-the-counter products were discussed.  High risk medication use - Cellcept  500 mg, 1 tablet p.o. twice daily, prednisone  2 mg p.o. daily.  Labs from November 25, 2023 were reviewed.  She had CBC,  c-Met, lipid panel, TSH and vitamin D  level done by her PCP.  ILD (interstitial lung  disease) (HCC) - PFTs:3/26/2024FVC 2.48 [80%], FEV1 2.05 [88%], F/F33, TLC 4.57 [87%], DLCO 20.23 [100%]Normal test.  Her last appointment with Dr. Theophilus was on December 29 2023.  He recommended continuing CellCept .  He also plans to do a repeat high-resolution CT.  Long term (current) use of systemic steroids - she remains on prednisone  2 mg p.o. daily.  She is unable to taper prednisone .  Positive anti-CCP test - her anti-CCP was 153 on August 02, 2023.  Her CCP was positive in the past greater than 250 on November 07, 2015.  Primary osteoarthritis of both hands-joint protection muscle strengthening was discussed.  Primary osteoarthritis of both knees - Her x-rays from October 2023 showed severe osteoarthritis.  She continues to have pain and discomfort in her knee joints.  She had small effusion in her right knee joint.  She had good response to cortisone injection at the last visit.  Patient requested cortisone injection to her bilateral knee joints.  After informed consent was obtained and side effects were discussed bilateral knee joints were injected with lidocaine  and Kenalog  as described above.  Patient tolerated the procedure well.  Raynaud's disease without gangrene-active during the winter months.  She had good capillary refill without any nailbed capillary changes or sclerodactyly.  Age-related osteoporosis without current pathological fracture - Patient states that she had a repeat DEXA with a T score of -2.2.  Fosamax  since May 08, 2021 prescribed by her PCP.  Vitamin D  deficiency-takes vitamin D .  Muscular deconditioning-she been trying to exercise on a regular basis.  Anxiety-she has been under a lot of stress as her daughter recently fell and had femur fracture.  She has been taking care of her daughter.  Thymoma  Former smoker  Orders: Orders Placed This Encounter  Procedures   Large  Joint Inj   No orders of the defined types were placed in this encounter.    Follow-Up Instructions: Return in about 3 months (around 04/06/2024) for Dermatomyositis.   Maya Nash, MD  Note - This record has been created using Animal nutritionist.  Chart creation errors have been sought, but may not always  have been located. Such creation errors do not reflect on  the standard of medical care.

## 2023-12-29 ENCOUNTER — Ambulatory Visit: Admitting: Pulmonary Disease

## 2023-12-29 ENCOUNTER — Encounter: Payer: Self-pay | Admitting: Pulmonary Disease

## 2023-12-29 VITALS — BP 120/72 | HR 68 | Temp 98.3°F | Ht 66.0 in | Wt 157.0 lb

## 2023-12-29 DIAGNOSIS — Z5181 Encounter for therapeutic drug level monitoring: Secondary | ICD-10-CM | POA: Diagnosis not present

## 2023-12-29 DIAGNOSIS — J849 Interstitial pulmonary disease, unspecified: Secondary | ICD-10-CM

## 2023-12-29 NOTE — Patient Instructions (Signed)
 I am glad you are stable with regard to your breathing and I have reviewed your lab tests which look okay Continue to use the CellCept  as prescribed Will order a follow-up CT in 6 months and return to clinic in 6 months

## 2023-12-29 NOTE — Progress Notes (Signed)
 Marisa Orozco    [id number]    12-Apr-1950  Primary Care Physician:Daniel, Jerel MATSU, MD  Referring Physician: Toribio Jerel MATSU, MD [address] [place],  [place] [address]  Chief complaint: Consult for interstitial lung disease  HPI: 73 y.o. who  has a past medical history of Allergy, Anxiety, Cancer (HCC), Dermatomyositis (HCC) (02/04/2016), Former smoker (02/04/2016), GERD (gastroesophageal reflux disease), PONV (postoperative nausea and vomiting), Raynaud's disease, Sjogren's disease (02/04/2016), and Sjogren's syndrome.   She has longstanding history of dermatomyositis, Sjogren's syndrome.  Diagnosed initially as a teenager.  She was treated with glucocorticoids at that time.  She had a flareup in 2017 and a repeat muscle biopsy.  She is currently under the care of Dr. Dolphus and being treated with methotrexate , Plaquenil  and prednisone  with good control of symptoms.  Dr. Dolphus noted some lung crackles on examination and sent her for evaluation of interstitial lung disease.  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  She was evaluated by Dr. 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  She has been followed radiographically.  States that her breathing is stable at present with no complaints of dyspnea, cough or chest congestion  Taken off methotrexate  and started on CellCept  500 mg twice daily.  CellCept  was increased to 1 g twice daily in May 2024.  CellCept  dose was decreased to 500 mg twice daily in October for low platelet levels.  Subsequent CBC showed platelet count has recovered.    Interim history: Discussed the use of AI scribe software for clinical note transcription with the patient, who gave verbal consent to proceed.  History of Present Illness Marisa Orozco is a 73 year old female with pulmonary disease who presents for follow-up of her  breathing and joint pain.  Dyspnea - Breathing is stable while on mycophenolate  500 mg twice daily - Due for a CT scan, as it has been over six months since the last one - Prefers CT scan over lung function test due to discomfort with pulmonary function testing  Arthralgia and myalgia - Joint pain and muscle soreness, particularly in the knees - Symptoms attributed to aging  Laboratory findings - Blood counts, including WBC and platelets, are stable but slightly low - Liver tests and metabolic panel from primary care August 2025 reviewed and they remain stable   Interim history: Pets: No pets Occupation: Works as a Data processing manager for a [employer] Exposures: She has feather pillows in her living room couch.  No mold, hot tub, Jacuzzi Smoking history: 25-pack-year smoker.  Quit in 2005 Travel history: Previously lived in [place] .  No significant recent travel Relevant family history: Mother has COPD.  She was a smoker.  Outpatient Encounter Medications as of 12/29/2023  Medication Sig   Acetaminophen (TYLENOL PO) Take by mouth as needed.   alendronate  (FOSAMAX ) 70 MG tablet Take 70 mg by mouth once a week.   estradiol  (ESTRACE) 0.1 MG/GM vaginal cream Place vaginally.   Magnesium Glycinate POWD 400 mg by Does not apply route 2 (two) times daily.   MELATONIN PO Take 0.5-1 mg by mouth daily.   Multiple Vitamin (MULTIVITAMIN PO) Take by mouth daily.   mycophenolate  (CELLCEPT ) 500 MG tablet Take 1 tablet (500 mg total) by mouth 2 (two) times daily.   predniSONE  (DELTASONE ) 1 MG tablet TAKE 2 TABLETS BY MOUTH  DAILY WITH BREAKFAST   sertraline (ZOLOFT) 25 MG tablet Take 25 mg by mouth daily.   vitamin C (ASCORBIC ACID) 250 MG tablet Take 250 mg by mouth daily.   VITAMIN D  PO Take 5,000 Units by mouth daily.   VITAMIN K PO Take 1 tablet by mouth daily.    diphenhydrAMINE (BENADRYL ALLERGY) 25 MG tablet Take 12.5 mg by mouth as needed. (Patient not taking: Reported on  12/29/2023)   No facility-administered encounter medications on file as of 12/29/2023.   Vitals:   12/29/23 0901  BP: 120/72  Pulse: 68  Temp: 98.3 F (36.8 C)  Height: 5' 6 (1.676 m)  Weight: 157 lb (71.2 kg)  SpO2: 100%  TempSrc: Oral  BMI (Calculated): 25.35     Physical Exam GEN: No acute distress. CV: Regular rate and rhythm, no murmurs. LUNGS: Clear to auscultation bilaterally, normal respiratory effort. SKIN JOINTS: Warm and dry, no rash.    Data Reviewed: Imaging: CT abdomen pelvis 01/30/2016- minimal basal reticulation. CT chest 09/30/2018-stable anterior mediastinal soft tissue density, mild basilar fibrotic changes CT abdomen pelvis 12/31/2020-increase in size of basal mild fibrotic changes. High resolution CT 07/08/2022-basilar predominant interstitial lung disease and probable UIP pattern.  Progression since 2020, stable anterior mediastinal soft tissue mass High resolution CT 02/03/2023-stable pattern of interstitial lung disease I have reviewed the images personally.  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 There is down pillows in her living room but exposure appears to be minimal.    Review of her lung imaging shows basal mild changes of reticulation back in 2017 which appears to be progressive  Appearance does not appear to be consistent with hypersensitivity pneumonitis and serologies are negative I advised her to get rid of the down pillows in the living room to be on the safe side  Reviewed findings in detail with patient.  We have stopped methotrexate  and started CellCept  for better treatment of interstitial lung disease.  So far she is tolerating it well We will hold at current  dose of 500 mg twice daily as platelet counts have recovered Breathing is well-managed. Blood counts, liver tests, and metabolic panel are stable. She prefers CT scan over lung function test due to discomfort with the latter. Lungs are clear on examination. She prefers to delay further testing and appointments to focus on her daughter's recovery from a femur fracture and surgery. - Continue mycophenolate  500 mg twice a day - Order chest CT in six months - Schedule follow-up appointment in six months - Offer video visit as an alternative if in-person visit becomes difficult  Arthralgia Joint pain and muscle soreness are present but manageable. Knee pain is attributed to aging.    Plan/Recommendations: - Continue CellCept  - Follow labs for monitoring - Follow-up in 6 months with CT scan  Lonna Coder MD [facility] 12/29/2023, 9:10 AM  CC: Toribio Jerel MATSU, MD

## 2024-01-04 DIAGNOSIS — F331 Major depressive disorder, recurrent, moderate: Secondary | ICD-10-CM | POA: Diagnosis not present

## 2024-01-04 DIAGNOSIS — E782 Mixed hyperlipidemia: Secondary | ICD-10-CM | POA: Diagnosis not present

## 2024-01-04 DIAGNOSIS — I10 Essential (primary) hypertension: Secondary | ICD-10-CM | POA: Diagnosis not present

## 2024-01-05 ENCOUNTER — Encounter: Payer: Self-pay | Admitting: Rheumatology

## 2024-01-05 ENCOUNTER — Ambulatory Visit: Attending: Rheumatology | Admitting: Rheumatology

## 2024-01-05 VITALS — BP 124/77 | HR 78 | Temp 98.2°F | Resp 14 | Ht 66.0 in | Wt 157.6 lb

## 2024-01-05 DIAGNOSIS — M3313 Other dermatomyositis without myopathy: Secondary | ICD-10-CM | POA: Diagnosis not present

## 2024-01-05 DIAGNOSIS — Z79899 Other long term (current) drug therapy: Secondary | ICD-10-CM | POA: Diagnosis not present

## 2024-01-05 DIAGNOSIS — Z87891 Personal history of nicotine dependence: Secondary | ICD-10-CM

## 2024-01-05 DIAGNOSIS — F419 Anxiety disorder, unspecified: Secondary | ICD-10-CM | POA: Diagnosis not present

## 2024-01-05 DIAGNOSIS — D4989 Neoplasm of unspecified behavior of other specified sites: Secondary | ICD-10-CM

## 2024-01-05 DIAGNOSIS — E559 Vitamin D deficiency, unspecified: Secondary | ICD-10-CM | POA: Diagnosis not present

## 2024-01-05 DIAGNOSIS — M19041 Primary osteoarthritis, right hand: Secondary | ICD-10-CM

## 2024-01-05 DIAGNOSIS — I7300 Raynaud's syndrome without gangrene: Secondary | ICD-10-CM

## 2024-01-05 DIAGNOSIS — R29898 Other symptoms and signs involving the musculoskeletal system: Secondary | ICD-10-CM

## 2024-01-05 DIAGNOSIS — M810 Age-related osteoporosis without current pathological fracture: Secondary | ICD-10-CM

## 2024-01-05 DIAGNOSIS — M3501 Sicca syndrome with keratoconjunctivitis: Secondary | ICD-10-CM | POA: Diagnosis not present

## 2024-01-05 DIAGNOSIS — Z7952 Long term (current) use of systemic steroids: Secondary | ICD-10-CM

## 2024-01-05 DIAGNOSIS — J849 Interstitial pulmonary disease, unspecified: Secondary | ICD-10-CM | POA: Diagnosis not present

## 2024-01-05 DIAGNOSIS — M19042 Primary osteoarthritis, left hand: Secondary | ICD-10-CM

## 2024-01-05 DIAGNOSIS — R7681 Abnormal rheumatoid factor and anti-citrullinated protein antibody without rheumatoid arthritis: Secondary | ICD-10-CM | POA: Diagnosis not present

## 2024-01-05 DIAGNOSIS — M170 Bilateral primary osteoarthritis of knee: Secondary | ICD-10-CM

## 2024-01-05 DIAGNOSIS — R768 Other specified abnormal immunological findings in serum: Secondary | ICD-10-CM

## 2024-01-05 MED ORDER — LIDOCAINE HCL 1 % IJ SOLN
1.5000 mL | INTRAMUSCULAR | Status: AC | PRN
  Administered 2024-01-05: 1.5 mL

## 2024-01-05 MED ORDER — TRIAMCINOLONE ACETONIDE 40 MG/ML IJ SUSP
40.0000 mg | INTRAMUSCULAR | Status: AC | PRN
  Administered 2024-01-05: 40 mg via INTRA_ARTICULAR

## 2024-01-05 NOTE — Patient Instructions (Signed)
 Standing Labs We placed an order today for your standing lab work.   Please have your standing labs drawn in November and every 3 months  Please have your labs drawn 2 weeks prior to your appointment so that the provider can discuss your lab results at your appointment, if possible.  Please note that you may see your imaging and lab results in MyChart before we have reviewed them. We will contact you once all results are reviewed. Please allow our office up to 72 hours to thoroughly review all of the results before contacting the office for clarification of your results.  WALK-IN LAB HOURS  Monday through Thursday from 8:00 am -12:30 pm and 1:00 pm-4:30 pm and Friday from 8:00 am-12:00 pm.  Patients with office visits requiring labs will be seen before walk-in labs.  You may encounter longer than normal wait times. Please allow additional time. Wait times may be shorter on  Monday and Thursday afternoons.  We do not book appointments for walk-in labs. We appreciate your patience and understanding with our staff.   Labs are drawn by [facility]. Please bring your co-pay at the time of your lab draw.  You may receive a bill from [facility] for your lab work.  Please note if you are on Hydroxychloroquine and and an order has been placed for a Hydroxychloroquine level,  you will need to have it drawn 4 hours or more after your last dose.  If you wish to have your labs drawn at another location, please call the office 24 hours in advance so we can fax the orders.  The office is located at [address], [address], [place], [place] [address]   If you have any questions regarding directions or hours of operation,  please call [phone].   As a reminder, please drink plenty of water prior to coming for your lab work. Thanks!   Vaccines You are taking a medication(s) that can suppress your immune system.  The following immunizations are recommended: Flu annually Covid-19  Td/Tdap (tetanus,  diphtheria, pertussis) every 10 years Pneumonia (Prevnar 15 then Pneumovax 23  at least 1 year apart.  Alternatively, can take Prevnar 20 without needing additional dose) Shingrix: 2 doses from 4 weeks to 6 months apart  Please check with your PCP to make sure you are up to date.   If you have signs or symptoms of an infection or start antibiotics: First, call your PCP for workup of your infection. Hold your medication through the infection, until you complete your antibiotics, and until symptoms resolve if you take the following: Injectable medication (Actemra, Benlysta, Cimzia, Cosentyx, Enbrel , Humira, Kevzara, Orencia, Remicade, Simponi, Stelara, Taltz, Tremfya) Methotrexate Leflunomide (Arava) Mycophenolate (Cellcept) Earma, Olumiant, or Rinvoq

## 2024-02-04 DIAGNOSIS — I10 Essential (primary) hypertension: Secondary | ICD-10-CM | POA: Diagnosis not present

## 2024-02-04 DIAGNOSIS — E782 Mixed hyperlipidemia: Secondary | ICD-10-CM | POA: Diagnosis not present

## 2024-02-04 DIAGNOSIS — F331 Major depressive disorder, recurrent, moderate: Secondary | ICD-10-CM | POA: Diagnosis not present

## 2024-02-08 ENCOUNTER — Other Ambulatory Visit: Payer: Self-pay | Admitting: Physician Assistant

## 2024-02-08 NOTE — Telephone Encounter (Signed)
 Last Fill: 11/04/2023  Next Visit: 04/13/2024  Last Visit: 01/05/2024  Dx: Dermatomyositis   Current Dose per office note on 01/05/2024: prednisone  2 mg p.o. daily.   Okay to refill Prednisone ?

## 2024-02-22 ENCOUNTER — Encounter: Payer: Self-pay | Admitting: Rheumatology

## 2024-02-22 ENCOUNTER — Other Ambulatory Visit: Payer: Self-pay | Admitting: Pulmonary Disease

## 2024-02-22 DIAGNOSIS — J849 Interstitial pulmonary disease, unspecified: Secondary | ICD-10-CM

## 2024-02-22 NOTE — Telephone Encounter (Signed)
 Refill sent for CELLCEPT  to [facility]  Dose: 500mg  tab by mouth twice daily   Per last OV with Dr. Theophilus: We will hold at current dose of 500 mg twice daily as platelet counts have recovered.  Last OV: 12/29/2023 Provider: Dr. Theophilus Pertinent labs: CBC/CMP stable 06/15/23.   Recommend repeat labs at this time. Of note, chart review shows labs will be obtained with rheumatology clinic - see patient message 02/22/24.   Next OV: due around 06/27/2024  Aleck Puls, PharmD, BCPS Clinical Pharmacist  [facility]

## 2024-02-22 NOTE — Telephone Encounter (Signed)
Routing to Pharmacy

## 2024-02-22 NOTE — Telephone Encounter (Signed)
 Yes, she should be getting labs every 3 months which should include CBC with differential, CMP with GFR and CK.  We can also check TSH and vitamin D .

## 2024-03-03 DIAGNOSIS — E1140 Type 2 diabetes mellitus with diabetic neuropathy, unspecified: Secondary | ICD-10-CM | POA: Diagnosis not present

## 2024-03-03 DIAGNOSIS — E782 Mixed hyperlipidemia: Secondary | ICD-10-CM | POA: Diagnosis not present

## 2024-03-03 DIAGNOSIS — I10 Essential (primary) hypertension: Secondary | ICD-10-CM | POA: Diagnosis not present

## 2024-03-10 DIAGNOSIS — H2513 Age-related nuclear cataract, bilateral: Secondary | ICD-10-CM | POA: Diagnosis not present

## 2024-03-10 DIAGNOSIS — G43B0 Ophthalmoplegic migraine, not intractable: Secondary | ICD-10-CM | POA: Diagnosis not present

## 2024-03-10 DIAGNOSIS — H40053 Ocular hypertension, bilateral: Secondary | ICD-10-CM | POA: Diagnosis not present

## 2024-03-10 DIAGNOSIS — H43812 Vitreous degeneration, left eye: Secondary | ICD-10-CM | POA: Diagnosis not present

## 2024-04-03 NOTE — Progress Notes (Signed)
 "  Office Visit Note  Patient: Marisa Orozco             Date of Birth: 17-Dec-1950           MRN: [id number]             PCP: Toribio Jerel MATSU, MD Referring: Toribio Jerel MATSU, MD Visit Date: 04/13/2024 Occupation: Data Unavailable  Subjective:  Pain in both knees  History of Present Illness: Marisa Orozco is a 73 y.o. female with dermatomyositis, Sjogren's, ILD, osteoarthritis and osteoporosis.  She returns today after her last visit in October 2025.  She denies any increased muscular weakness or tenderness.  She continues to have pain and discomfort in her both knees.  She states she is having difficulty walking and she uses a cane at times.  She has an appointment coming up with Dr. Vernetta.  She states she is not ready for total knee replacement due to her family situation.  She denies rash or shortness of breath.  She continues to be on CellCept  500 mg p.o. twice daily and prednisone  2 mg p.o. daily.  She continues to be on Fosamax..    Activities of Daily Living:  Patient reports morning stiffness for 0 minutes.   Patient Denies nocturnal pain.  Difficulty dressing/grooming: Denies Difficulty climbing stairs: Reports Difficulty getting out of chair: Reports Difficulty using hands for taps, buttons, cutlery, and/or writing: Denies  Review of Systems  Constitutional:  Positive for fatigue.  HENT:  Positive for mouth dryness. Negative for mouth sores.   Eyes:  Positive for dryness.  Respiratory:  Negative for shortness of breath.   Cardiovascular:  Positive for palpitations. Negative for chest pain.  Gastrointestinal:  Negative for blood in stool, constipation and diarrhea.  Endocrine: Negative for increased urination.  Genitourinary:  Negative for involuntary urination.  Musculoskeletal:  Positive for joint pain, gait problem, joint pain, joint swelling, myalgias, muscle weakness, muscle tenderness and myalgias. Negative for morning stiffness.  Skin:  Positive for color change.  Negative for rash, hair loss and sensitivity to sunlight.  Allergic/Immunologic: Negative for susceptible to infections.  Neurological:  Negative for dizziness and headaches.  Hematological:  Negative for swollen glands.  Psychiatric/Behavioral:  Negative for depressed mood and sleep disturbance. The patient is not nervous/anxious.     PMFS History:  Patient Active Problem List   Diagnosis Date Noted   ILD (interstitial lung disease) (HCC) 08/25/2022   Palpitations 01/15/2021   Hypothyroidism 07/26/2019   Unilateral primary osteoarthritis, right knee 02/03/2018   Primary osteoarthritis of both hands 09/08/2017   Sjogren's disease 02/04/2016   Dermatomyositis (HCC) 02/04/2016   Former smoker 02/04/2016   Thymoma 02/04/2016   Fatigue 02/15/2012    Past Medical History:  Diagnosis Date   Allergy    Anxiety    Cancer (HCC)    basal cell carcinoma - face   Dermatomyositis (HCC) 02/04/2016   Jo 1 Positive  Skin Bx- Subtle interface Dermatitis    Former smoker 02/04/2016   GERD (gastroesophageal reflux disease)    otc med prn   PONV (postoperative nausea and vomiting)    Raynaud's disease    Sjogren's disease 02/04/2016   Positive ANA, Positive Ro, Positive CCP, Negative RF, Parotitis, Positive Sicca   Sjogren's syndrome    from MD note    Family History  Problem Relation Age of Onset   COPD Mother    Heart disease Mother    Diabetes Father    Kidney disease Father  Rectal cancer Maternal Uncle    Epilepsy Daughter    Esophageal cancer Neg Hx    Stomach cancer Neg Hx    Ulcerative colitis Neg Hx    Past Surgical History:  Procedure Laterality Date   BREAST SURGERY     augmentation   BUNIONECTOMY     right foot   COLONOSCOPY     DIAGNOSTIC LAPAROSCOPY     DILATION AND CURETTAGE OF UTERUS  04/15/2011   Procedure: DILATATION AND CURETTAGE;  Surgeon: Oneil FORBES Piety;  Location: [facility];  Service: Gynecology;  Laterality: N/A;   IR RADIOLOGIST EVAL & MGMT  02/04/2021    MUSCLE BIOPSY Left 01/02/2016   Procedure: MUSCLE BIOPSY LEFT DELTOID;  Surgeon: Krystal Russell, MD;  Location: [facility];  Service: General;  Laterality: Left;  left deltoid muscle bx   NASAL SEPTUM SURGERY     SVD     x 2   TONSILLECTOMY     WISDOM TOOTH EXTRACTION     Social History[1] Social History   Social History Narrative   Not on file     Immunization History  Administered Date(s) Administered   INFLUENZA, HIGH DOSE SEASONAL PF 01/07/2018   Influenza-Unspecified 01/04/2014   PFIZER(Purple Top)SARS-COV-2 Vaccination 04/26/2019, 05/17/2019, 12/12/2019   Pneumococcal Conjugate-13 01/05/2016   Zoster Recombinant(Shingrix) 08/11/2017, 12/17/2017     Objective: Vital Signs: BP (!) 152/85   Pulse 89   Temp 97.8 F (36.6 C)   Resp 16   Ht 5' 6 (1.676 m)   Wt 164 lb 6.4 oz (74.6 kg)   BMI 26.53 kg/m    Physical Exam Vitals and nursing note reviewed.  Constitutional:      Appearance: She is well-developed.  HENT:     Head: Normocephalic and atraumatic.  Eyes:     Conjunctiva/sclera: Conjunctivae normal.  Cardiovascular:     Rate and Rhythm: Normal rate and regular rhythm.     Heart sounds: Normal heart sounds.  Pulmonary:     Effort: Pulmonary effort is normal.     Breath sounds: Rales present.  Abdominal:     General: Bowel sounds are normal.     Palpations: Abdomen is soft.  Musculoskeletal:     Cervical back: Normal range of motion.  Lymphadenopathy:     Cervical: No cervical adenopathy.  Skin:    General: Skin is warm and dry.     Capillary Refill: Capillary refill takes less than 2 seconds.  Neurological:     Mental Status: She is alert and oriented to person, place, and time.  Psychiatric:        Behavior: Behavior normal.      Musculoskeletal Exam: Cervical, thoracic and lumbar spine were in good range of motion.  There was no SI joint tenderness.  Shoulder joints, elbow joints, wrist joints, MCPs, PIPs and DIPs were in good  range of motion with no synovitis.  Bilateral PIP and DIP thickening was noted.  Hip joints and knee joints were in good range of motion without any warmth swelling or effusion.  There was no tenderness over ankles or MTPs.   CDAI Exam: CDAI Score: -- Patient Global: --; Provider Global: -- Swollen: --; Tender: -- Joint Exam 04/13/2024   No joint exam has been documented for this visit   There is currently no information documented on the homunculus. Go to the Rheumatology activity and complete the homunculus joint exam.  Investigation: No additional findings.  Imaging: No results found.  Recent Labs: Lab  Results  Component Value Date   WBC 12.1 (H) 04/11/2024   HGB 13.9 04/11/2024   PLT 158 04/11/2024   NA 141 04/11/2024   K 4.0 04/11/2024   CL 106 04/11/2024   CO2 28 04/11/2024   GLUCOSE 87 04/11/2024   BUN 29 (H) 04/11/2024   CREATININE 0.70 04/11/2024   BILITOT 0.4 04/11/2024   ALKPHOS 54 01/28/2023   AST 15 04/11/2024   ALT 17 04/11/2024   PROT 6.8 04/11/2024   ALBUMIN 4.1 01/28/2023   CALCIUM 9.5 04/11/2024   GFRAA 87 06/25/2020   QFTBGOLDPLUS NEGATIVE 07/07/2017    Speciality Comments: PLQ Eye Exam: 02/23/2022 WNL @ [facility]. Follow up 1 year.   Fosamax May 08, 2021  Procedures:  No procedures performed Allergies: Albuterol, Codeine, Flagyl [metronidazole hcl], Nexium [esomeprazole magnesium], Imuran  [azathioprine ], Sulfa antibiotics, and Sulfa drugs cross reactors   Assessment / Plan:     Visit Diagnoses: Dermatomyositis (HCC) - Jo 1+, Positive muscle biopsy for dermatomyositis: No increased muscular weakness or tenderness was noted.  She had no rash on examination.  She had been doing well on the combination of CellCept  and low-dose prednisone .  Sjogren's syndrome with keratoconjunctivitis sicca-she continues to have sicca symptoms.  High risk medication use - Cellcept  500 mg, 1 tablet p.o. twice daily, prednisone  2 mg p.o. daily.   Labs obtained on April 11, 2024 white cell count was elevated at 12.1.  CMP was normal and CK was normal at 59.  Patient denies any infection.  She did not have any cortisone injection recently.  Will recheck CBC in 1 week.  Information regarding immunization was placed in the AVS.  ILD (interstitial lung disease) (HCC) - PFTs:3/26/2024FVC 2.48 [80%], FEV1 2.05 [88%], F/F33, TLC 4.57 [87%], DLCO 20.23 [100%]Normal test.  Patient is followed by Dr. Theophilus.  I reviewed the high-resolution CT from February 03, 2023 which did not show any significant progression from April 2024.  Follow-up CT is pending.  Long term (current) use of systemic steroids - she remains on prednisone  2 mg p.o. daily.  She is unable to taper prednisone .  Prescription refill for prednisone  was sent.  Positive anti-CCP test - her anti-CCP was 153 on August 02, 2023.  Her CCP was positive in the past greater than 250 on November 07, 2015.  No synovitis was noted on the examination.  Primary osteoarthritis of both hands-she has bilateral PIP and DIP thickening.  Joint protection was discussed.  Primary osteoarthritis of both knees - Her x-rays from October 2023 showed severe osteoarthritis.  A cortisone injection was given to her knee joints in October 2025.  She states she had no response to the cortisone injections.  She is planning to see Dr. Vernetta but she is not ready for total knee replacement due to the family situation.  I discussed the option of viscosupplement injections which she may consider in the future.  Raynaud's disease without gangrene-currently not symptomatic.  Age-related osteoporosis without current pathological fracture - Patient states that she had a repeat DEXA with a T score of -2.2.  Fosamax since May 08, 2021 prescribed by her PCP.  Vitamin D  deficiency-she takes vitamin D .  Vitamin D  is monitored by her PCP.  Anxiety - she has been under a lot of stress as her daughter recently fell and had femur  fracture.  She has been taking care of her daughter.  Muscular deconditioning-lower extremity muscle strengthening exercises were demonstrated.  She was encouraged to do lower extremity  muscle strength exercises.  A handout was placed in the AVS.  Thymoma  Former smoker  Orders: Orders Placed This Encounter  Procedures   CBC with Differential/Platelet   Meds ordered this encounter  Medications   predniSONE  (DELTASONE ) 1 MG tablet    Sig: Take 2 tablets (2 mg total) by mouth daily with breakfast.    Dispense:  60 tablet    Refill:  2     Follow-Up Instructions: Return in about 3 months (around 07/12/2024) for DM, OA.   Maya Nash, MD  Note - This record has been created using Animal nutritionist.  Chart creation errors have been sought, but may not always  have been located. Such creation errors do not reflect on  the standard of medical care.     [1]  Social History Tobacco Use   Smoking status: Former    Current packs/day: 0.00    Average packs/day: 1 pack/day for 25.0 years (25.0 ttl pk-yrs)    Types: Cigarettes    Start date: 08/05/1978    Quit date: 08/05/2003    Years since quitting: 20.7    Passive exposure: Never   Smokeless tobacco: Never  Vaping Use   Vaping status: Never Used  Substance Use Topics   Alcohol  use: Yes    Comment: rarely - mixed drink   Drug use: No   "

## 2024-04-11 ENCOUNTER — Encounter: Payer: Self-pay | Admitting: Rheumatology

## 2024-04-11 ENCOUNTER — Other Ambulatory Visit: Payer: Self-pay | Admitting: *Deleted

## 2024-04-11 DIAGNOSIS — Z79899 Other long term (current) drug therapy: Secondary | ICD-10-CM

## 2024-04-11 DIAGNOSIS — Z7952 Long term (current) use of systemic steroids: Secondary | ICD-10-CM

## 2024-04-11 DIAGNOSIS — M810 Age-related osteoporosis without current pathological fracture: Secondary | ICD-10-CM

## 2024-04-11 DIAGNOSIS — I7300 Raynaud's syndrome without gangrene: Secondary | ICD-10-CM

## 2024-04-11 DIAGNOSIS — M3501 Sicca syndrome with keratoconjunctivitis: Secondary | ICD-10-CM

## 2024-04-11 DIAGNOSIS — J849 Interstitial pulmonary disease, unspecified: Secondary | ICD-10-CM

## 2024-04-11 DIAGNOSIS — M3313 Other dermatomyositis without myopathy: Secondary | ICD-10-CM

## 2024-04-12 ENCOUNTER — Ambulatory Visit: Payer: Self-pay | Admitting: Rheumatology

## 2024-04-12 LAB — CBC WITH DIFFERENTIAL/PLATELET
Absolute Lymphocytes: 1755 {cells}/uL (ref 850–3900)
Absolute Monocytes: 944 {cells}/uL (ref 200–950)
Basophils Absolute: 24 {cells}/uL (ref 0–200)
Basophils Relative: 0.2 %
Eosinophils Absolute: 303 {cells}/uL (ref 15–500)
Eosinophils Relative: 2.5 %
HCT: 44.5 % (ref 35.9–46.0)
Hemoglobin: 13.9 g/dL (ref 11.7–15.5)
MCH: 28.3 pg (ref 27.0–33.0)
MCHC: 31.2 g/dL — ABNORMAL LOW (ref 31.6–35.4)
MCV: 90.4 fL (ref 81.4–101.7)
MPV: 12.4 fL (ref 7.5–12.5)
Monocytes Relative: 7.8 %
Neutro Abs: 9075 {cells}/uL — ABNORMAL HIGH (ref 1500–7800)
Neutrophils Relative %: 75 %
Platelets: 158 Thousand/uL (ref 140–400)
RBC: 4.92 Million/uL (ref 3.80–5.10)
RDW: 13.5 % (ref 11.0–15.0)
Total Lymphocyte: 14.5 %
WBC: 12.1 Thousand/uL — ABNORMAL HIGH (ref 3.8–10.8)

## 2024-04-12 LAB — COMPLETE METABOLIC PANEL WITHOUT GFR
AG Ratio: 2 (calc) (ref 1.0–2.5)
ALT: 17 U/L (ref 6–29)
AST: 15 U/L (ref 10–35)
Albumin: 4.5 g/dL (ref 3.6–5.1)
Alkaline phosphatase (APISO): 57 U/L (ref 37–153)
BUN/Creatinine Ratio: 41 (calc) — ABNORMAL HIGH (ref 6–22)
BUN: 29 mg/dL — ABNORMAL HIGH (ref 7–25)
CO2: 28 mmol/L (ref 20–32)
Calcium: 9.5 mg/dL (ref 8.6–10.4)
Chloride: 106 mmol/L (ref 98–110)
Creat: 0.7 mg/dL (ref 0.60–1.00)
Globulin: 2.3 g/dL (ref 1.9–3.7)
Glucose, Bld: 87 mg/dL (ref 65–139)
Potassium: 4 mmol/L (ref 3.5–5.3)
Sodium: 141 mmol/L (ref 135–146)
Total Bilirubin: 0.4 mg/dL (ref 0.2–1.2)
Total Protein: 6.8 g/dL (ref 6.1–8.1)

## 2024-04-12 LAB — CK: Total CK: 59 U/L (ref 18–225)

## 2024-04-12 NOTE — Progress Notes (Signed)
 CMP stable, CBC shows elevated white cell count.Most likely due to prednisone  use.  CK normal.

## 2024-04-13 ENCOUNTER — Encounter: Payer: Self-pay | Admitting: Rheumatology

## 2024-04-13 ENCOUNTER — Ambulatory Visit: Attending: Rheumatology | Admitting: Rheumatology

## 2024-04-13 VITALS — BP 152/85 | HR 89 | Temp 97.8°F | Resp 16 | Ht 66.0 in | Wt 164.4 lb

## 2024-04-13 DIAGNOSIS — Z7952 Long term (current) use of systemic steroids: Secondary | ICD-10-CM | POA: Insufficient documentation

## 2024-04-13 DIAGNOSIS — M3313 Other dermatomyositis without myopathy: Secondary | ICD-10-CM | POA: Diagnosis present

## 2024-04-13 DIAGNOSIS — Z87891 Personal history of nicotine dependence: Secondary | ICD-10-CM | POA: Diagnosis present

## 2024-04-13 DIAGNOSIS — J849 Interstitial pulmonary disease, unspecified: Secondary | ICD-10-CM | POA: Diagnosis present

## 2024-04-13 DIAGNOSIS — D4989 Neoplasm of unspecified behavior of other specified sites: Secondary | ICD-10-CM | POA: Diagnosis present

## 2024-04-13 DIAGNOSIS — Z79899 Other long term (current) drug therapy: Secondary | ICD-10-CM | POA: Diagnosis present

## 2024-04-13 DIAGNOSIS — M3501 Sicca syndrome with keratoconjunctivitis: Secondary | ICD-10-CM | POA: Insufficient documentation

## 2024-04-13 DIAGNOSIS — M810 Age-related osteoporosis without current pathological fracture: Secondary | ICD-10-CM | POA: Insufficient documentation

## 2024-04-13 DIAGNOSIS — R29898 Other symptoms and signs involving the musculoskeletal system: Secondary | ICD-10-CM | POA: Insufficient documentation

## 2024-04-13 DIAGNOSIS — M170 Bilateral primary osteoarthritis of knee: Secondary | ICD-10-CM | POA: Diagnosis present

## 2024-04-13 DIAGNOSIS — M19042 Primary osteoarthritis, left hand: Secondary | ICD-10-CM | POA: Insufficient documentation

## 2024-04-13 DIAGNOSIS — I7300 Raynaud's syndrome without gangrene: Secondary | ICD-10-CM | POA: Insufficient documentation

## 2024-04-13 DIAGNOSIS — D72828 Other elevated white blood cell count: Secondary | ICD-10-CM | POA: Diagnosis present

## 2024-04-13 DIAGNOSIS — M19041 Primary osteoarthritis, right hand: Secondary | ICD-10-CM | POA: Insufficient documentation

## 2024-04-13 DIAGNOSIS — F419 Anxiety disorder, unspecified: Secondary | ICD-10-CM | POA: Insufficient documentation

## 2024-04-13 DIAGNOSIS — R7681 Abnormal rheumatoid factor and anti-citrullinated protein antibody without rheumatoid arthritis: Secondary | ICD-10-CM | POA: Diagnosis present

## 2024-04-13 DIAGNOSIS — E559 Vitamin D deficiency, unspecified: Secondary | ICD-10-CM | POA: Insufficient documentation

## 2024-04-13 MED ORDER — PREDNISONE 1 MG PO TABS: 2.0000 mg | ORAL_TABLET | Freq: Every day | ORAL | 2 refills | Status: AC

## 2024-04-13 NOTE — Patient Instructions (Addendum)
 Standing Labs We placed an order today for your standing lab work.   Please have your standing labs drawn in April and every 3 months  Please have your labs drawn 2 weeks prior to your appointment so that the provider can discuss your lab results at your appointment, if possible.  Please note that you may see your imaging and lab results in MyChart before we have reviewed them. We will contact you once all results are reviewed. Please allow our office up to 72 hours to thoroughly review all of the results before contacting the office for clarification of your results.  WALK-IN LAB HOURS  Monday through Thursday from 8:00 am - 4:30 pm and Friday from 8:00 am-12:00 pm.  Patients with office visits requiring labs will be seen before walk-in labs.  You may encounter longer than normal wait times. Please allow additional time. Wait times may be shorter on  Monday and Thursday afternoons.  We do not book appointments for walk-in labs. We appreciate your patience and understanding with our staff.   Labs are drawn by [facility]. Please bring your co-pay at the time of your lab draw.  You may receive a bill from [facility] for your lab work.  Please note if you are on Hydroxychloroquine  and and an order has been placed for a Hydroxychloroquine  level,  you will need to have it drawn 4 hours or more after your last dose.  If you wish to have your labs drawn at another location, please call the office 24 hours in advance so we can fax the orders.  The office is located at [address], [address], [place], [place] [address]   If you have any questions regarding directions or hours of operation,  please call [phone].   As a reminder, please drink plenty of water prior to coming for your lab work. Thanks!   Vaccines You are taking a medication(s) that can suppress your immune system.  The following immunizations are recommended: Flu annually Covid-19  RSV Td/Tdap (tetanus, diphtheria, pertussis)  every 10 years Pneumonia (Prevnar 15 then Pneumovax 23 at least 1 year apart.  Alternatively, can take Prevnar 20 without needing additional dose) Shingrix: 2 doses from 4 weeks to 6 months apart  Please check with your PCP to make sure you are up to date.   If you have signs or symptoms of an infection or start antibiotics: First, call your PCP for workup of your infection. Hold your medication through the infection, until you complete your antibiotics, and until symptoms resolve if you take the following: Injectable medication (Actemra, Benlysta, Cimzia, Cosentyx, Enbrel, Humira, Kevzara, Orencia, Remicade, Simponi, Stelara, Taltz, Tremfya) Methotrexate  Leflunomide (Arava) Mycophenolate  (Cellcept ) Earma Jewel, or Rinvoq  Exercises for Chronic Knee Pain Chronic knee pain is pain that lasts longer than 3 months. For most people with chronic knee pain, exercise and weight loss is an important part of treatment. Your health care provider may want you to focus on: Making the muscles that support your knee stronger. This can take pressure off your knee and reduce pain. Preventing knee stiffness. How far you can move your knee, keeping it there or making it farther. Losing weight (if this applies) to take pressure off your knee, lower your risk for injury, and make it easier for you to exercise. Your provider will help you make an exercise program that fits your needs and physical abilities. Below are simple, low-impact exercises you can do at home. Ask your provider or physical therapist how  often you should do your exercise program and how many times to repeat each exercise. General safety tips  Get your provider's approval before doing any exercises. Start slowly and stop any time you feel pain. Do not exercise if your knee pain is flaring up. Warm up first. Stretching a cold muscle can cause an injury. Do 5-10 minutes of easy movement or light stretching before beginning your  exercises. Do 5-10 minutes of low-impact activity (like walking or cycling) before starting strengthening exercises. Contact your provider any time you have pain during or after exercising. Exercise can cause discomfort but should not be painful. It is normal to be a little stiff or sore after exercising. Stretching and range-of-motion exercises Front thigh stretch  Stand up straight and support your body by holding on to a chair or resting one hand on a wall. With your legs straight and close together, bend one knee to lift your heel up toward your butt. Using one hand for support, grab your ankle with your free hand. Pull your foot up closer toward your butt to feel the stretch in front of your thigh. Hold the stretch for 30 seconds. Repeat __________ times. Complete this exercise __________ times a day. Back thigh stretch  Sit on the floor with your back straight and your legs out straight in front of you. Place the palms of your hands on the floor and slide them toward your feet as you bend at the hip. Try to touch your nose to your knees and feel the stretch in the back of your thighs. Hold for 30 seconds. Repeat __________ times. Complete this exercise __________ times a day. Calf stretch  Stand facing a wall. Place the palms of your hands flat against the wall, arms extended, and lean slightly against the wall. Get into a lunge position with one leg bent at the knee and the other leg stretched out straight behind you. Keep both feet facing the wall and increase the bend in your knee while keeping the heel of the other leg flat on the ground. You should feel the stretch in your calf. Hold for 30 seconds. Repeat __________ times. Complete this exercise __________ times a day. Strengthening exercises Straight leg lift  Lie on your back with one knee bent and the other leg out straight. Slowly lift the straight leg without bending the knee. Lift until your foot is about 12 inches  (30 cm) off the floor. Hold for 3-5 seconds and slowly lower your leg. Repeat __________ times. Complete this exercise __________ times a day. Single leg dip  Stand between two chairs and put both hands on the backs of the chairs for support. Extend one leg out straight with your body weight resting on the heel of the standing leg. Slowly bend your standing knee to dip your body to the level that is comfortable for you. Hold for 3-5 seconds. Repeat __________ times. Complete this exercise __________ times a day. Hamstring curls  Stand straight, knees close together, facing the back of a chair. Hold on to the back of a chair with both hands. Keep one leg straight. Bend the other knee while bringing the heel up toward the butt until the knee is bent at a 90-degree angle (right angle). Hold for 3-5 seconds. Repeat __________ times. Complete this exercise __________ times a day. Wall squat  Stand straight with your back, hips, and head against a wall. Step forward one foot at a time with your back still against the wall.  Your feet should be 2 feet (61 cm) from the wall at shoulder width. Keeping your back, hips, and head against the wall, slide down the wall to as close to a sitting position as you can get. Hold for 5-10 seconds, then slowly slide back up. Repeat __________ times. Complete this exercise __________ times a day. Step-ups  Stand in front of a sturdy platform or stool that is about 6 inches (15 cm) high. Slowly step up with your left / right foot, keeping your knee in line with your hip and foot. Do not let your knee bend so far that you cannot see your toes. Hold on to a chair for balance, but do not use it for support. Slowly unlock your knee and lower yourself to the starting position. Repeat __________ times. Complete this exercise __________ times a day. Contact a health care provider if: Your exercises cause pain. Your pain is worse after you exercise. Your pain  prevents you from doing your exercises. This information is not intended to replace advice given to you by your health care provider. Make sure you discuss any questions you have with your health care provider. Document Revised: 04/07/2022 Document Reviewed: 04/07/2022 Elsevier Patient Education  2024 [employer].

## 2024-04-18 ENCOUNTER — Ambulatory Visit: Admitting: Cardiology

## 2024-04-18 ENCOUNTER — Encounter: Payer: Self-pay | Admitting: Cardiology

## 2024-04-18 VITALS — BP 160/84 | HR 79 | Ht 66.0 in | Wt 167.0 lb

## 2024-04-18 DIAGNOSIS — E039 Hypothyroidism, unspecified: Secondary | ICD-10-CM | POA: Insufficient documentation

## 2024-04-18 DIAGNOSIS — M3313 Other dermatomyositis without myopathy: Secondary | ICD-10-CM | POA: Diagnosis present

## 2024-04-18 DIAGNOSIS — R002 Palpitations: Secondary | ICD-10-CM | POA: Insufficient documentation

## 2024-04-18 DIAGNOSIS — M3501 Sicca syndrome with keratoconjunctivitis: Secondary | ICD-10-CM | POA: Insufficient documentation

## 2024-04-18 NOTE — Patient Instructions (Signed)
 Medication Instructions:  The current medical regimen is effective;  continue present plan and medications.  *If you need a refill on your cardiac medications before your next appointment, please call your pharmacy*  Follow-Up: At [facility], you and your health needs are our priority.  As part of our continuing mission to provide you with exceptional heart care, our providers are all part of one team.  This team includes your primary Cardiologist (physician) and Advanced Practice Providers or APPs (Physician Assistants and Nurse Practitioners) who all work together to provide you with the care you need, when you need it.  Provider:   Follow up as needed with Dr Jeffrie

## 2024-04-18 NOTE — Progress Notes (Signed)
 " Cardiology Office Note:  .   Date:  04/18/2024  ID:  Marisa Orozco, DOB 1950/04/26, MRN [id number] PCP: Toribio Jerel MATSU, MD  [facility] Providers Cardiologist:  Oneil Parchment, MD     History of Present Illness: .   Marisa Orozco is a 74 y.o. female Discussed the use of AI scribe   History of Present Illness Marisa Orozco is a 74 year old female with a history of palpitations who presents for follow-up.  She experiences palpitations characterized by a rapid heart rate, occurring intermittently with periods of frequent palpitations followed by times without symptoms. Her [employer] watch has recorded premature ventricular contractions (PVCs) but no atrial fibrillation. She reports no chest pain or unusual shortness of breath.  Her past medical history includes Sjogren's syndrome, dermatomyositis, a low-grade activity thymoma identified on PET CT in 2017, interstitial lung disease, and Raynaud's disease. The thymoma has been monitored conservatively without resection. She is under rheumatology care for dermatomyositis, confirmed by a Jo-1 positive muscle biopsy, and experiences dry eyes and dry mouth associated with Sjogren's syndrome.  She is currently taking mycophenolate  mofetil (Cellcept ) and a very low dose of prednisone  for dermatomyositis. She was previously prescribed low-dose amlodipine  (2.5 mg) but is not currently taking it.  She has a history of remote tobacco use. Her blood pressure readings have been variable, with higher readings noted in medical settings, attributed to stress, particularly related to work pressures. At home, her blood pressure is typically around 125/70 mmHg.  She describes her Raynaud's symptoms as severe but has not tried medication for it.      Studies Reviewed: SABRA   EKG Interpretation Date/Time:  Tuesday April 18 2024 10:17:47 EST Ventricular Rate:  79 PR Interval:  148 QRS Duration:  80 QT Interval:  358 QTC Calculation: 410 R  Axis:   -1  Text Interpretation: Normal sinus rhythm Normal ECG No previous ECGs available Confirmed by Parchment Oneil ([id number]) on 04/18/2024 10:37:09 AM    Results Radiology PET CT chest (2017): Low-grade metabolic activity in thymoma  Diagnostic EKG (04/18/2024): Normal; no evidence of atrial fibrillation or other abnormalities (Independently interpreted) [employer] wearable cardiac rhythm tracing: Premature ventricular contractions present; no evidence of atrial fibrillation Risk Assessment/Calculations:           Physical Exam:   VS:  BP (!) 160/84   Pulse 79   Ht 5' 6 (1.676 m)   Wt 167 lb (75.8 kg)   SpO2 92%   BMI 26.95 kg/m    Wt Readings from Last 3 Encounters:  04/18/24 167 lb (75.8 kg)  04/13/24 164 lb 6.4 oz (74.6 kg)  01/05/24 157 lb 9.6 oz (71.5 kg)    GEN: Well nourished, well developed in no acute distress NECK: No JVD; No carotid bruits CARDIAC: RRR, no murmurs, no rubs, no gallops RESPIRATORY:  Clear to auscultation without rales, wheezing or rhonchi  ABDOMEN: Soft, non-tender, non-distended EXTREMITIES:  No edema; No deformity   ASSESSMENT AND PLAN: .    Assessment and Plan Assessment & Plan Palpitations due to ventricular premature beats Intermittent palpitations attributed to ventricular premature beats (PVCs). No evidence of atrial fibrillation on EKG. PVCs are benign and often triggered by stress, lack of sleep, caffeine, sugar, or rheumatologic flare-ups. Blood pressure is well-controlled at home, with occasional elevations due to stress. - Continue monitoring blood pressure at home, especially during periods of stress. - No need for antihypertensive medication at this time.  We will go  ahead and graduate from the cardiology clinic.  Please let us  know if we can be of further assistance        Signed, Oneil Parchment, MD  "

## 2024-04-19 ENCOUNTER — Ambulatory Visit: Admitting: Orthopaedic Surgery

## 2024-04-19 ENCOUNTER — Other Ambulatory Visit (INDEPENDENT_AMBULATORY_CARE_PROVIDER_SITE_OTHER): Payer: Self-pay

## 2024-04-19 ENCOUNTER — Other Ambulatory Visit: Payer: Self-pay

## 2024-04-19 ENCOUNTER — Other Ambulatory Visit: Payer: Self-pay | Admitting: Radiology

## 2024-04-19 DIAGNOSIS — M1711 Unilateral primary osteoarthritis, right knee: Secondary | ICD-10-CM | POA: Diagnosis not present

## 2024-04-19 DIAGNOSIS — G8929 Other chronic pain: Secondary | ICD-10-CM

## 2024-04-19 DIAGNOSIS — M1712 Unilateral primary osteoarthritis, left knee: Secondary | ICD-10-CM | POA: Diagnosis not present

## 2024-04-19 DIAGNOSIS — M25562 Pain in left knee: Secondary | ICD-10-CM | POA: Diagnosis not present

## 2024-04-19 DIAGNOSIS — M25561 Pain in right knee: Secondary | ICD-10-CM

## 2024-04-19 NOTE — Progress Notes (Signed)
 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  She has actually tried medial offloading osteoarthritis braces and sometimes they hurt worse than her knee itself.  She has had steroid injections in her knees.  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  She does have Sjogren's.  On exam her right knee definitely hurts worse than her left knee.  Both knees have varus malalignment that is easily correctable.  Both knees have excellent range of motion and no significant patellofemoral crepitation.  The knees feel ligamentously stable to me.  X-rays of both knees today shows arthritis mainly in the medial compartment of both knees and some of the patellofemoral joint.  The right is bone-on-bone and the left is close to that.  We talked about treatment options.  She has not had hyaluronic acid for her knees so I do feel that this is the next treatment option for her and she appropriately inquired about it as well.  She understands that this is worth trying to treat the long-term pain from osteoarthritis and is willing to try this.  We will work on getting this scheduled and ordered.  Again she has failed other conservative treatment including steroid injections with her knees.  We did go over quad strengthening exercises which could help as well.  This patient is diagnosed with osteoarthritis of the knee(s).    Radiographs show evidence of joint space narrowing, osteophytes, subchondral sclerosis and/or subchondral cysts.  This patient has knee pain which interferes with functional and activities of daily living.    This patient has experienced inadequate response, adverse effects and/or intolerance with conservative treatments such as acetaminophen, NSAIDS, topical creams,  physical therapy or regular exercise, knee bracing and/or weight loss.   This patient has experienced inadequate response or has a contraindication to intra articular steroid injections for at least 3 months.   This patient is not scheduled to have a total knee replacement within 6 months of starting treatment with viscosupplementation.

## 2024-04-24 ENCOUNTER — Other Ambulatory Visit (HOSPITAL_COMMUNITY): Payer: Self-pay

## 2024-04-24 MED ORDER — FLUZONE HIGH-DOSE 0.5 ML IM SUSY
0.5000 mL | PREFILLED_SYRINGE | Freq: Once | INTRAMUSCULAR | 0 refills | Status: AC
  Filled 2024-04-24: qty 0.5, 1d supply, fill #0

## 2024-06-27 ENCOUNTER — Other Ambulatory Visit

## 2024-08-24 ENCOUNTER — Ambulatory Visit: Admitting: Rheumatology
